# Patient Record
Sex: Male | Born: 1985 | Race: White | Hispanic: No | Marital: Single | Smoking: Current every day smoker
Health system: Southern US, Community
[De-identification: ages and names within clinical notes are randomized; demographics above are authoritative.]

## PROBLEM LIST (undated history)

## (undated) DIAGNOSIS — R7881 Bacteremia: Secondary | ICD-10-CM

## (undated) DIAGNOSIS — F1990 Other psychoactive substance use, unspecified, uncomplicated: Secondary | ICD-10-CM

## (undated) DIAGNOSIS — F17200 Nicotine dependence, unspecified, uncomplicated: Secondary | ICD-10-CM

## (undated) DIAGNOSIS — B9562 Methicillin resistant Staphylococcus aureus infection as the cause of diseases classified elsewhere: Secondary | ICD-10-CM

## (undated) HISTORY — PX: BRAIN SURGERY: SHX531

## (undated) HISTORY — PX: OTHER SURGICAL HISTORY: SHX169

---
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
## 2007-06-11 ENCOUNTER — Emergency Department (HOSPITAL_COMMUNITY): Admission: EM | Admit: 2007-06-11 | Discharge: 2007-06-11 | Payer: Self-pay | Admitting: Emergency Medicine
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
## 2007-06-22 ENCOUNTER — Emergency Department (HOSPITAL_COMMUNITY): Admission: EM | Admit: 2007-06-22 | Discharge: 2007-06-22 | Payer: Self-pay | Admitting: Emergency Medicine

## 2007-07-02 ENCOUNTER — Emergency Department (HOSPITAL_COMMUNITY): Admission: EM | Admit: 2007-07-02 | Discharge: 2007-07-02 | Payer: Self-pay | Admitting: Emergency Medicine

## 2008-10-02 ENCOUNTER — Emergency Department (HOSPITAL_COMMUNITY): Admission: EM | Admit: 2008-10-02 | Discharge: 2008-10-02 | Payer: Self-pay | Admitting: Emergency Medicine

## 2008-12-28 ENCOUNTER — Emergency Department (HOSPITAL_COMMUNITY): Admission: EM | Admit: 2008-12-28 | Discharge: 2008-12-28 | Payer: Self-pay | Admitting: Emergency Medicine
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
## 2009-01-25 ENCOUNTER — Emergency Department (HOSPITAL_COMMUNITY): Admission: EM | Admit: 2009-01-25 | Discharge: 2009-01-25 | Payer: Self-pay | Admitting: Emergency Medicine
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
## 2009-06-19 ENCOUNTER — Emergency Department (HOSPITAL_COMMUNITY): Admission: EM | Admit: 2009-06-19 | Discharge: 2009-06-19 | Payer: Self-pay | Admitting: Emergency Medicine

## 2009-06-25 ENCOUNTER — Emergency Department (HOSPITAL_COMMUNITY): Admission: EM | Admit: 2009-06-25 | Discharge: 2009-06-25 | Payer: Self-pay | Admitting: Emergency Medicine

## 2009-07-08 ENCOUNTER — Emergency Department (HOSPITAL_COMMUNITY): Admission: EM | Admit: 2009-07-08 | Discharge: 2009-07-09 | Payer: Self-pay | Admitting: Emergency Medicine

## 2009-07-08 ENCOUNTER — Emergency Department (HOSPITAL_COMMUNITY): Admission: EM | Admit: 2009-07-08 | Discharge: 2009-07-08 | Payer: Self-pay | Admitting: Emergency Medicine

## 2009-07-08 IMAGING — CR DG FINGER LITTLE 2+V*L*
3 series · 3 of 3 positions shown · non-contrast
Comparison: None

CLINICAL DATA: Status post fall with pain in the distal and mid
interphalangeal joints

LEFT LITTLE FINGER 2+V

[x finger pa left]
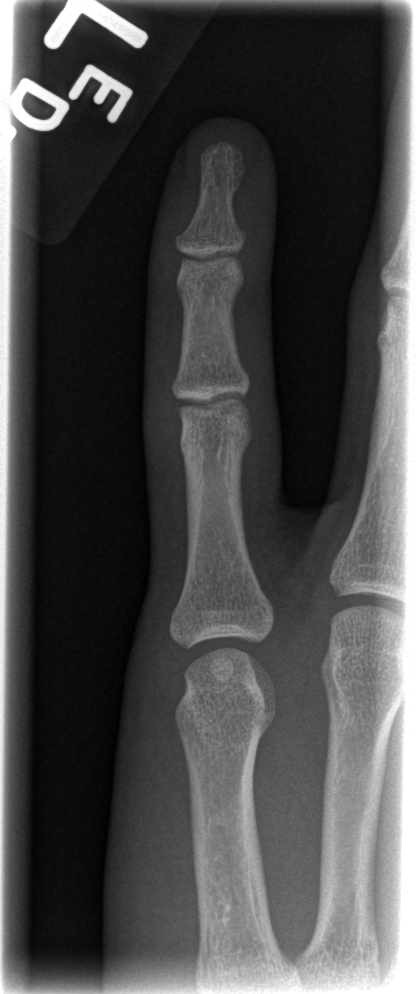

[x finger obl. left]
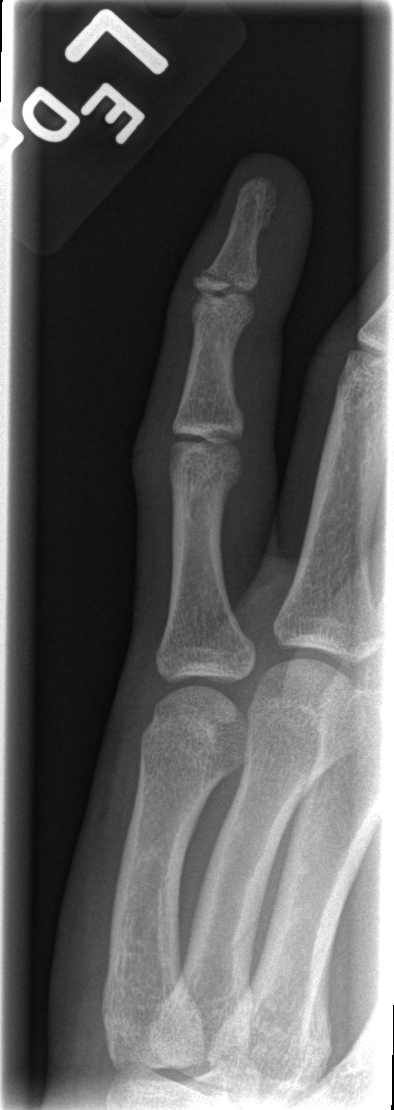

[x finger lateral left]
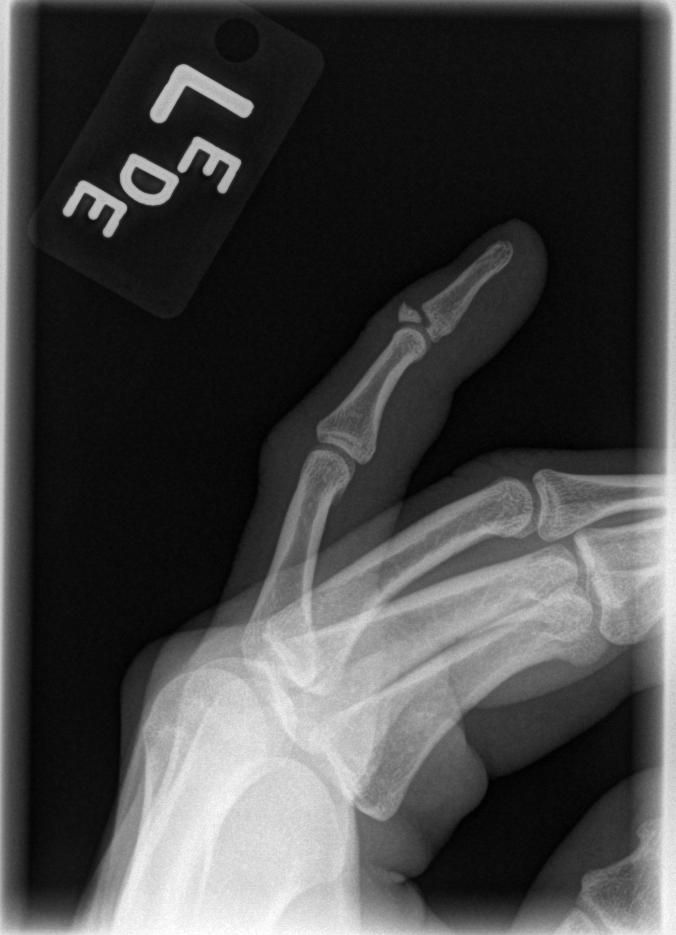

[3 of 3 positions shown; findings below may reference images not displayed]

FINDINGS: Bone density is within normal limits.  There is a
triangular fracture fragment avulsed from the proximal dorsal
aspect of the distal phalanx.  Intra-articular extension of the
oblique fracture line is seen.  Associated soft tissue swelling is
noted.
IMPRESSION: Distal phalangeal avulsion fracture as described above

## 2009-07-31 ENCOUNTER — Emergency Department (HOSPITAL_COMMUNITY): Admission: EM | Admit: 2009-07-31 | Discharge: 2009-07-31 | Payer: Self-pay | Admitting: Emergency Medicine
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
## 2009-10-12 ENCOUNTER — Emergency Department (HOSPITAL_COMMUNITY): Admission: EM | Admit: 2009-10-12 | Discharge: 2009-10-12 | Payer: Self-pay | Admitting: Emergency Medicine

## 2009-10-25 ENCOUNTER — Emergency Department (HOSPITAL_COMMUNITY): Admission: EM | Admit: 2009-10-25 | Discharge: 2009-10-25 | Payer: Self-pay | Admitting: Emergency Medicine
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
## 2009-10-31 ENCOUNTER — Emergency Department (HOSPITAL_COMMUNITY): Admission: EM | Admit: 2009-10-31 | Discharge: 2009-10-31 | Payer: Self-pay | Admitting: Emergency Medicine

## 2009-10-31 IMAGING — CR DG CHEST 2V
2 series · 2 of 2 positions shown · non-contrast
Comparison: [DATE]

CLINICAL DATA: Chest pain, cough, and nausea.

CHEST - 2 VIEW

[w chest pa]
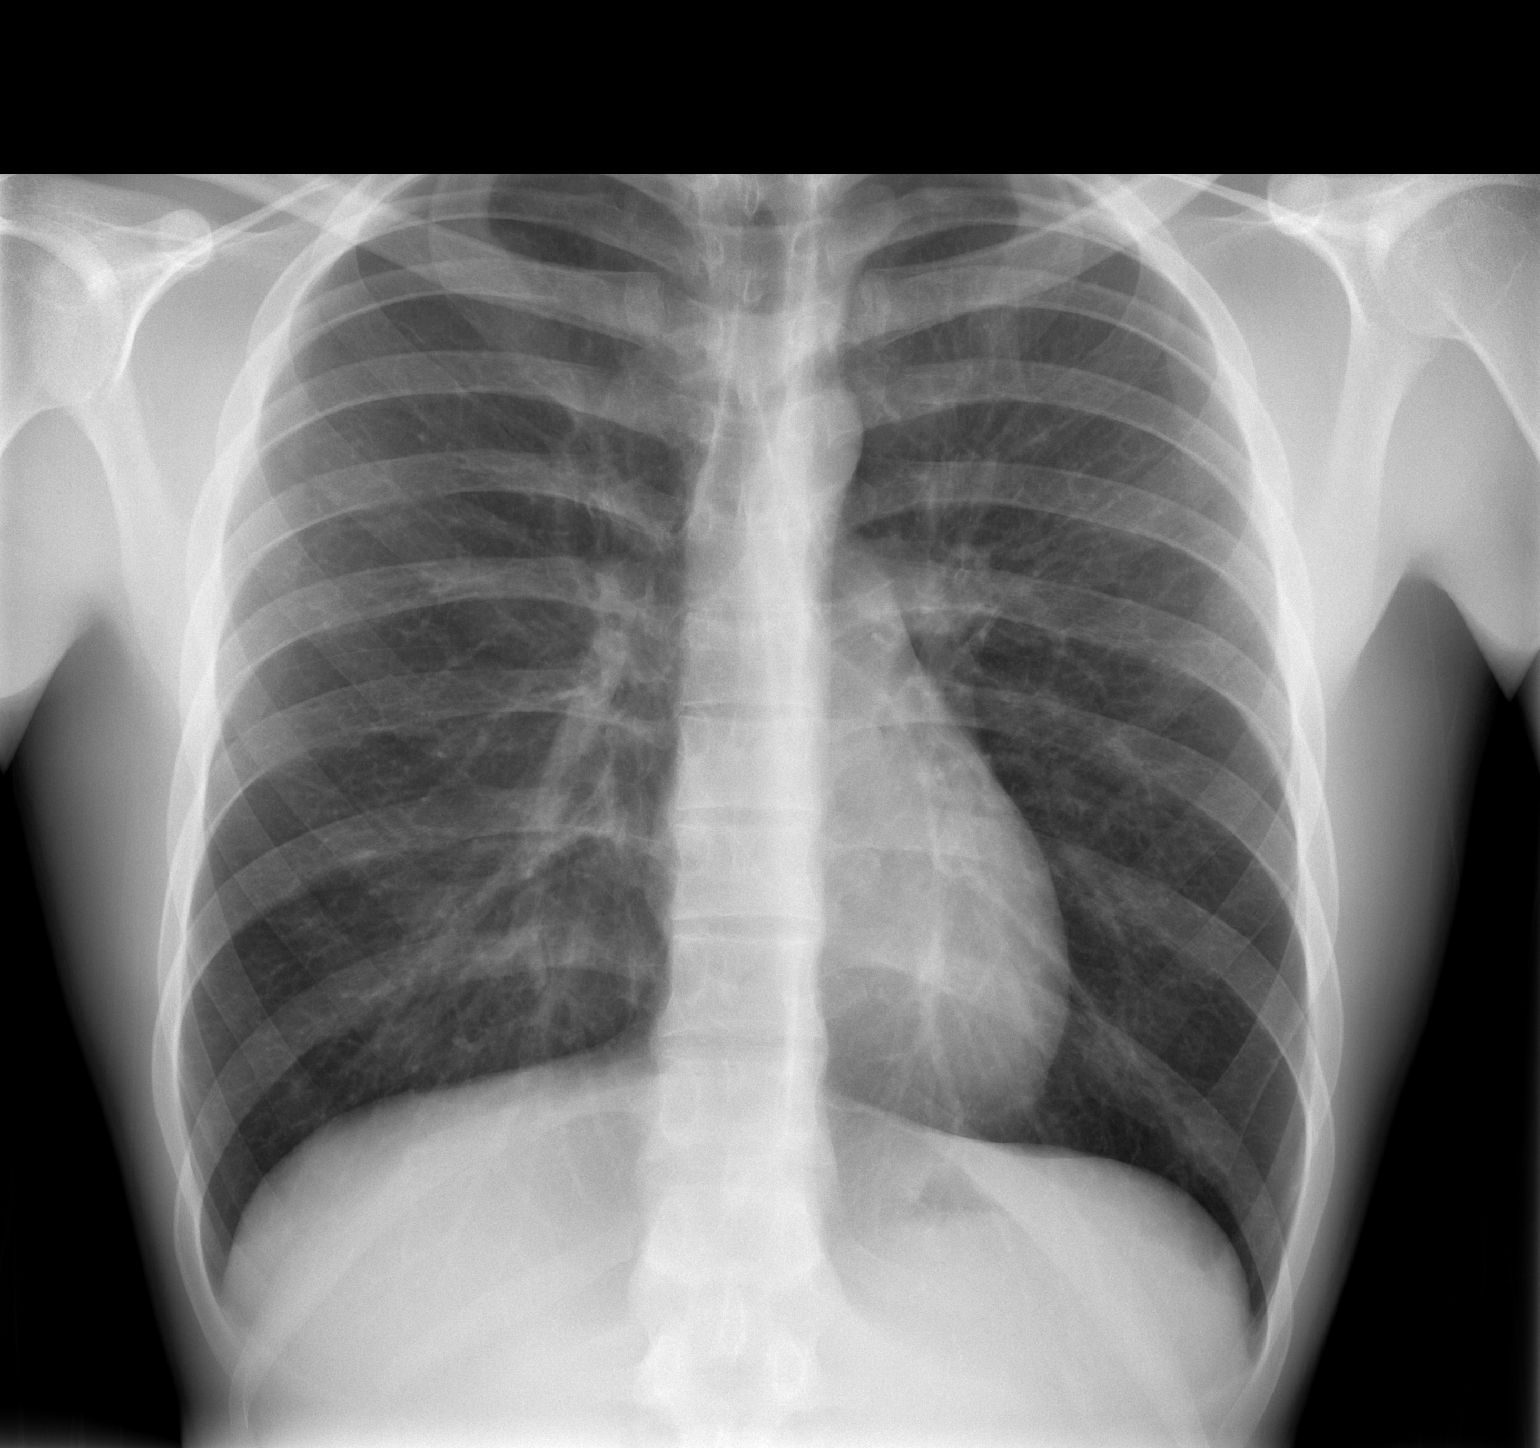

[w chest lat]
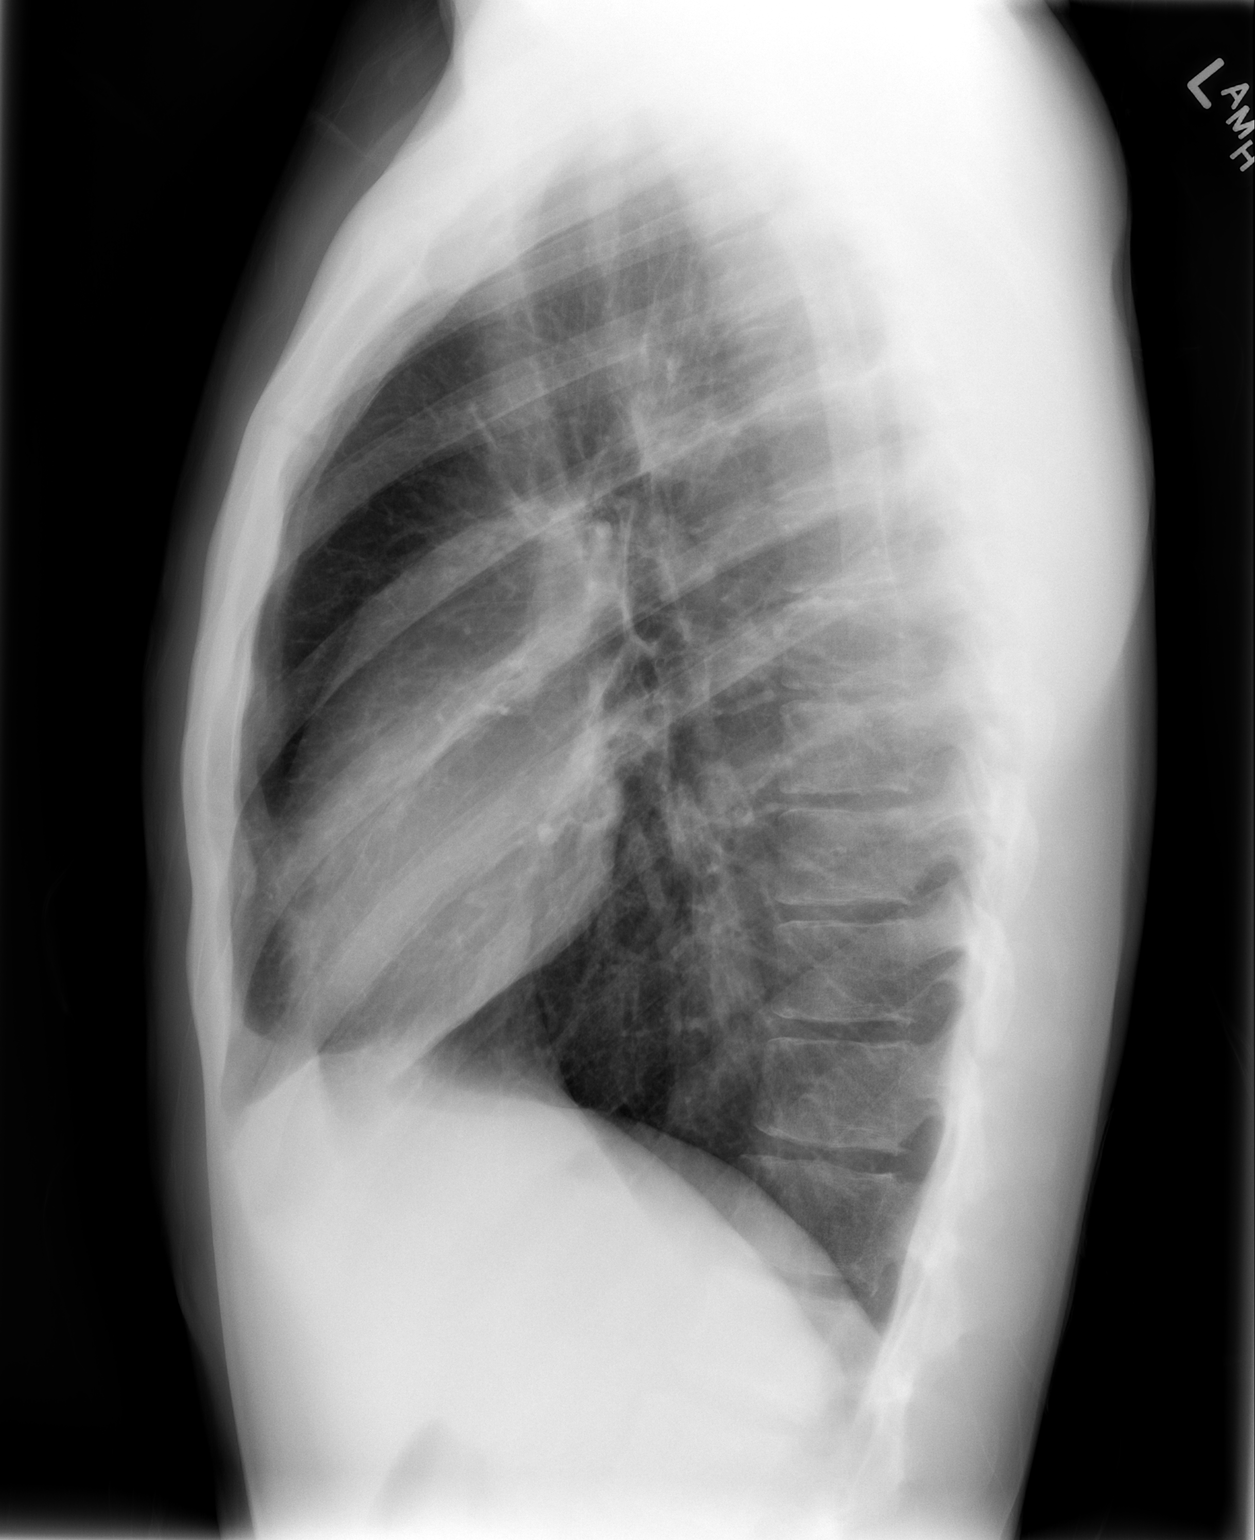

[2 of 2 positions shown; findings below may reference images not displayed]

FINDINGS: The heart size and vascularity are normal and the lungs
are clear.  No bony abnormality.
IMPRESSION: Normal chest.

## 2009-11-23 ENCOUNTER — Emergency Department (HOSPITAL_COMMUNITY): Admission: EM | Admit: 2009-11-23 | Discharge: 2009-11-23 | Payer: Self-pay | Admitting: Emergency Medicine

## 2009-11-30 ENCOUNTER — Emergency Department (HOSPITAL_COMMUNITY): Admission: EM | Admit: 2009-11-30 | Discharge: 2009-11-30 | Payer: Self-pay | Admitting: Emergency Medicine

## 2009-12-09 ENCOUNTER — Emergency Department (HOSPITAL_COMMUNITY): Admission: EM | Admit: 2009-12-09 | Discharge: 2009-12-09 | Payer: Self-pay | Admitting: Emergency Medicine
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
## 2009-12-15 ENCOUNTER — Emergency Department (HOSPITAL_COMMUNITY): Admission: EM | Admit: 2009-12-15 | Discharge: 2009-12-15 | Payer: Self-pay | Admitting: Emergency Medicine

## 2010-01-25 ENCOUNTER — Emergency Department (HOSPITAL_COMMUNITY): Admission: EM | Admit: 2010-01-25 | Discharge: 2010-01-25 | Payer: Self-pay | Admitting: Emergency Medicine

## 2010-02-05 ENCOUNTER — Emergency Department (HOSPITAL_COMMUNITY): Admission: EM | Admit: 2010-02-05 | Discharge: 2010-02-05 | Payer: Self-pay | Admitting: Emergency Medicine

## 2010-02-05 IMAGING — CR DG FINGER LITTLE 2+V*L*
3 series · 3 of 3 positions shown · non-contrast
Comparison: None.

CLINICAL DATA: The patient fell.  Pain distal phalangeal tuft.

LEFT LITTLE FINGER 2+V

[x finger pa left]
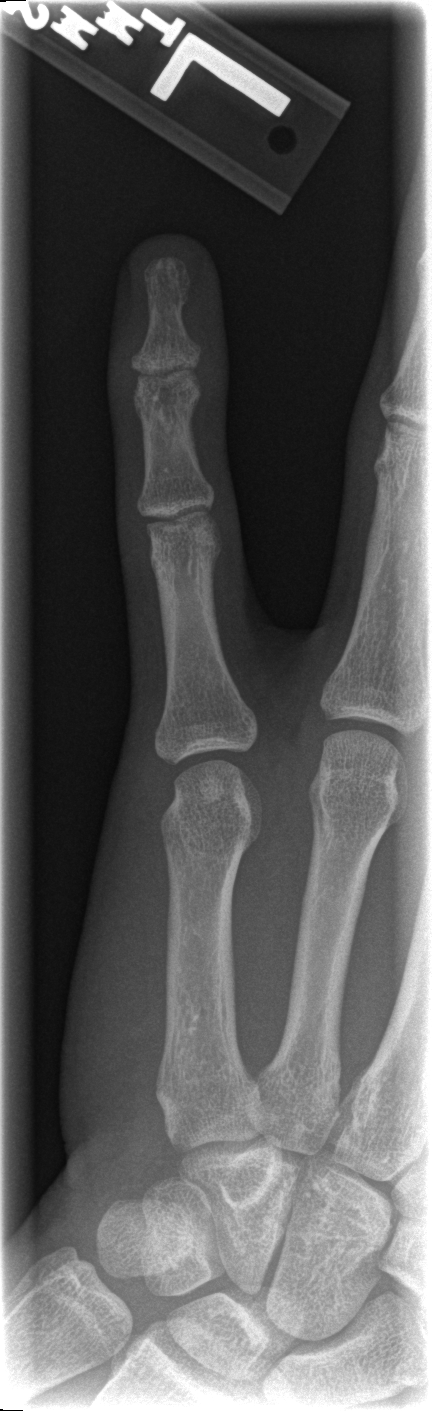

[x finger obl. left]
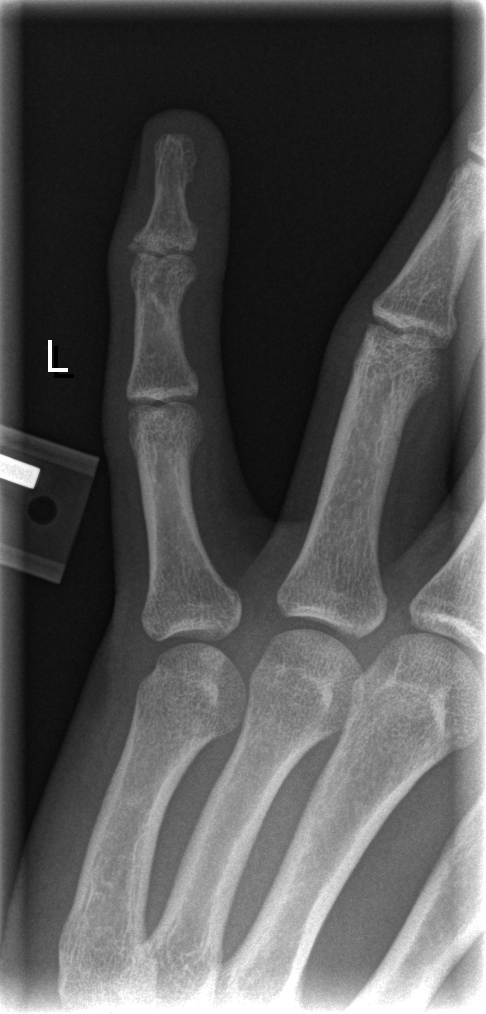

[x finger lateral left]
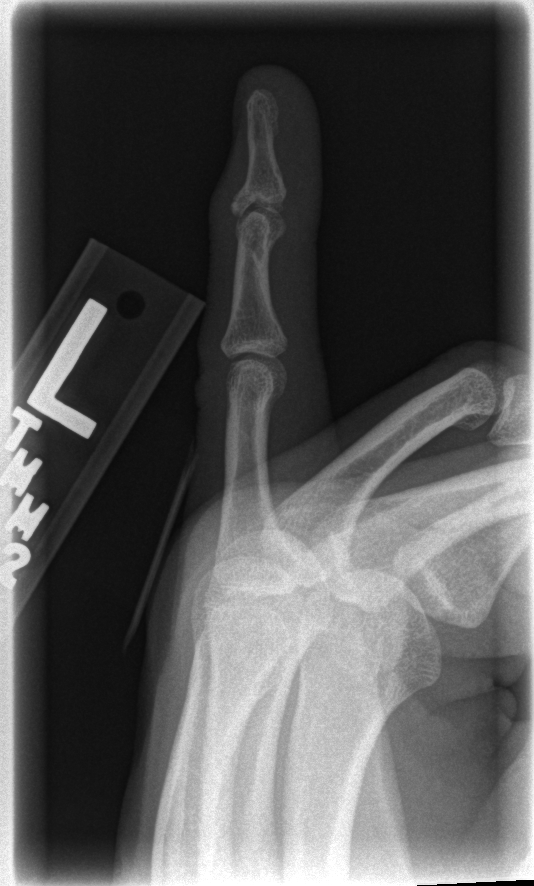

[3 of 3 positions shown; findings below may reference images not displayed]

FINDINGS: DJD changes of the DIP joint.  No acute fracture.  No
subluxation.
IMPRESSION: No acute findings.  DJD.

## 2010-02-09 ENCOUNTER — Emergency Department (HOSPITAL_COMMUNITY): Admission: EM | Admit: 2010-02-09 | Discharge: 2010-02-10 | Payer: Self-pay | Admitting: Emergency Medicine

## 2010-02-14 ENCOUNTER — Emergency Department (HOSPITAL_COMMUNITY): Admission: EM | Admit: 2010-02-14 | Discharge: 2010-02-15 | Payer: Self-pay | Admitting: Emergency Medicine

## 2010-02-15 ENCOUNTER — Ambulatory Visit: Payer: Self-pay | Admitting: Psychiatry

## 2010-02-15 ENCOUNTER — Inpatient Hospital Stay (HOSPITAL_COMMUNITY): Admission: AD | Admit: 2010-02-15 | Discharge: 2010-02-16 | Payer: Self-pay | Admitting: Psychiatry

## 2010-02-18 ENCOUNTER — Emergency Department (HOSPITAL_COMMUNITY): Admission: EM | Admit: 2010-02-18 | Discharge: 2010-02-18 | Payer: Self-pay | Admitting: Emergency Medicine

## 2010-02-18 IMAGING — CR DG HAND COMPLETE 3+V*R*
3 series · 3 of 3 positions shown · non-contrast
Comparison: [DATE]

CLINICAL DATA: Injury, pain.

RIGHT HAND - COMPLETE 3+ VIEW

[x hand pa right]
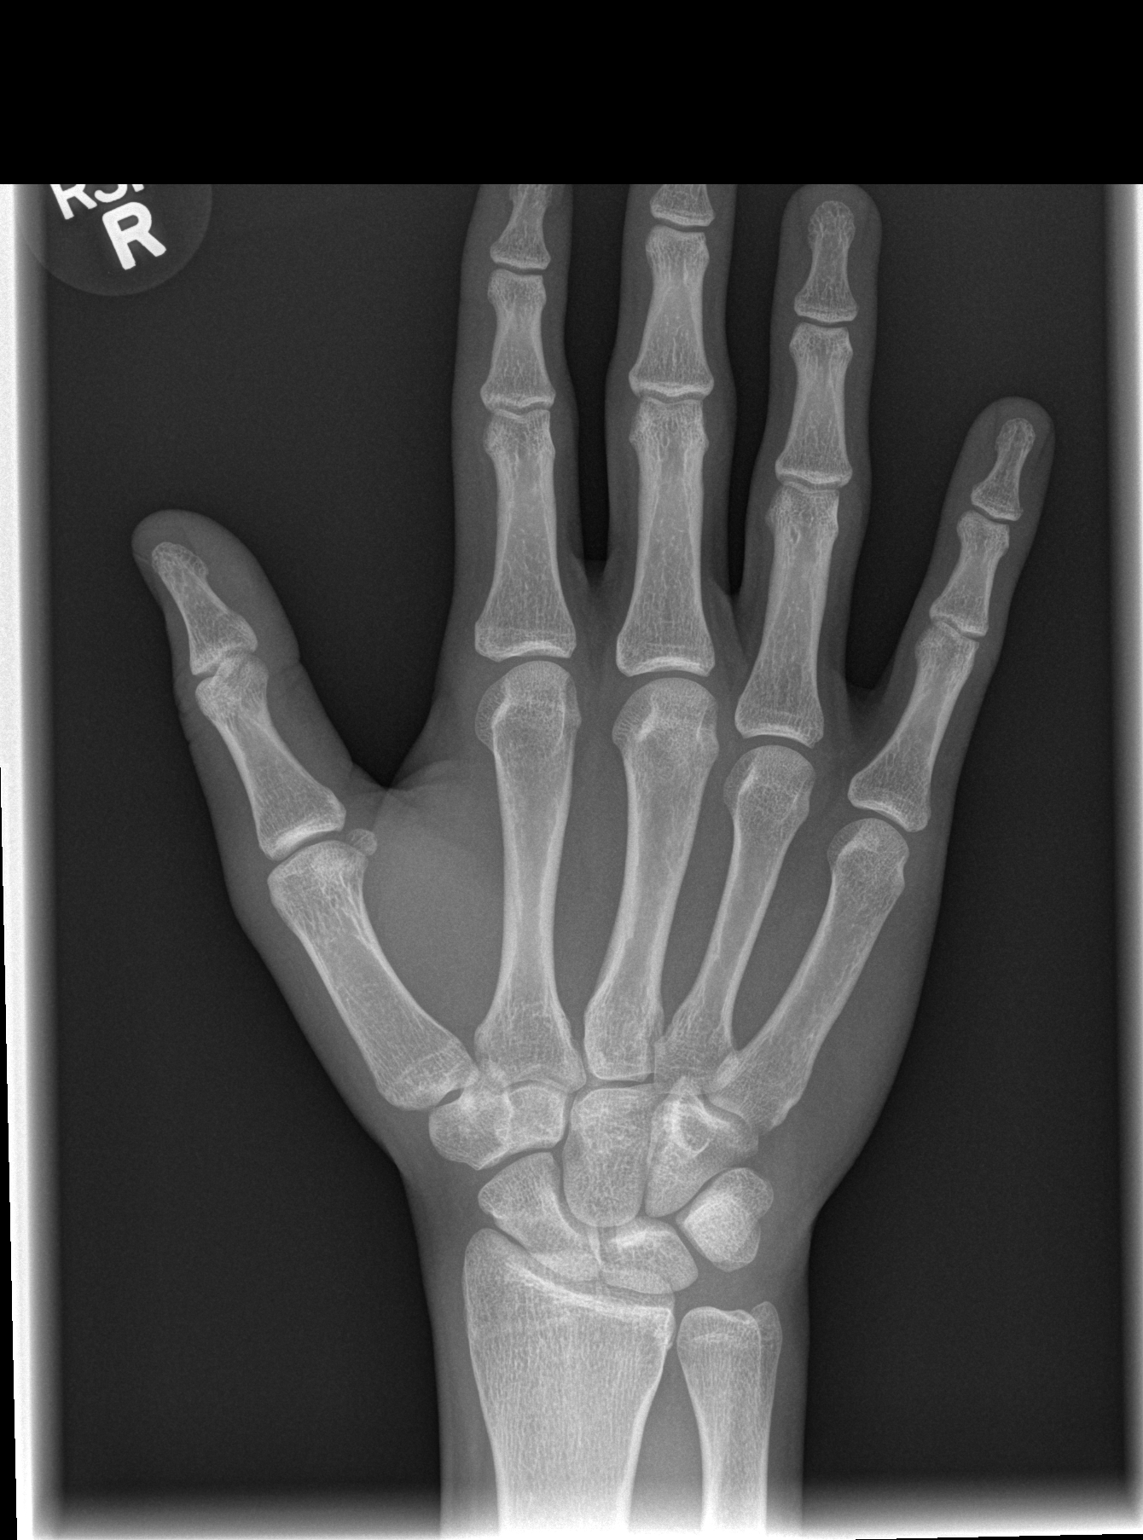

[x hand oblique right]
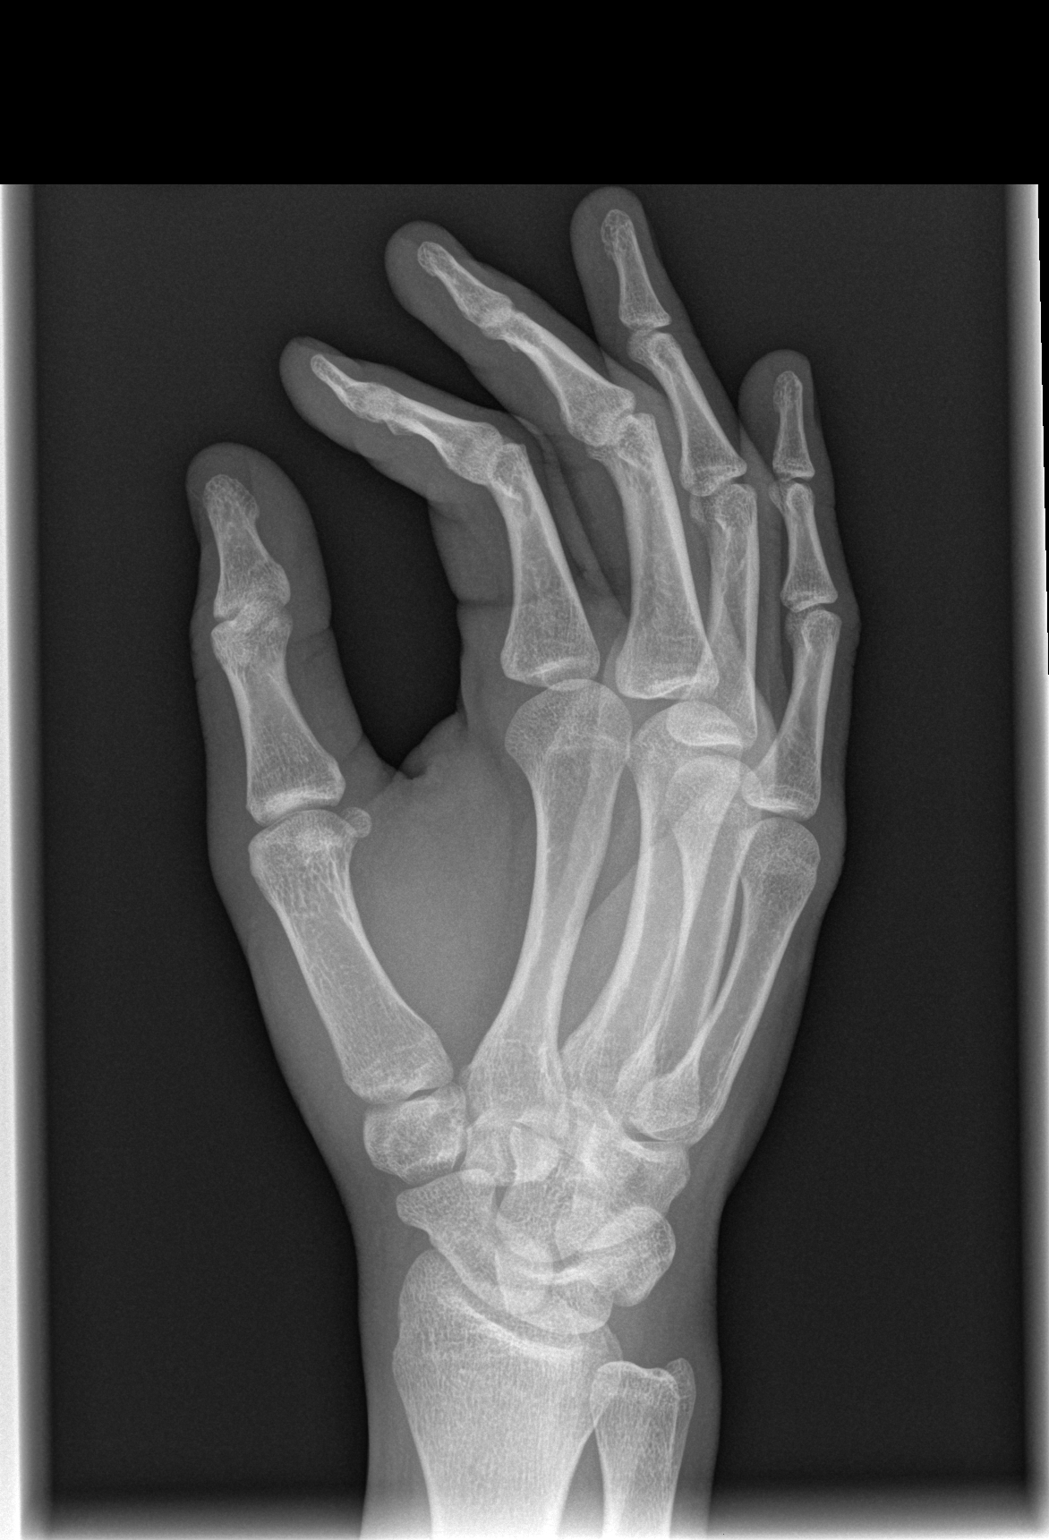

[x hand lat right]
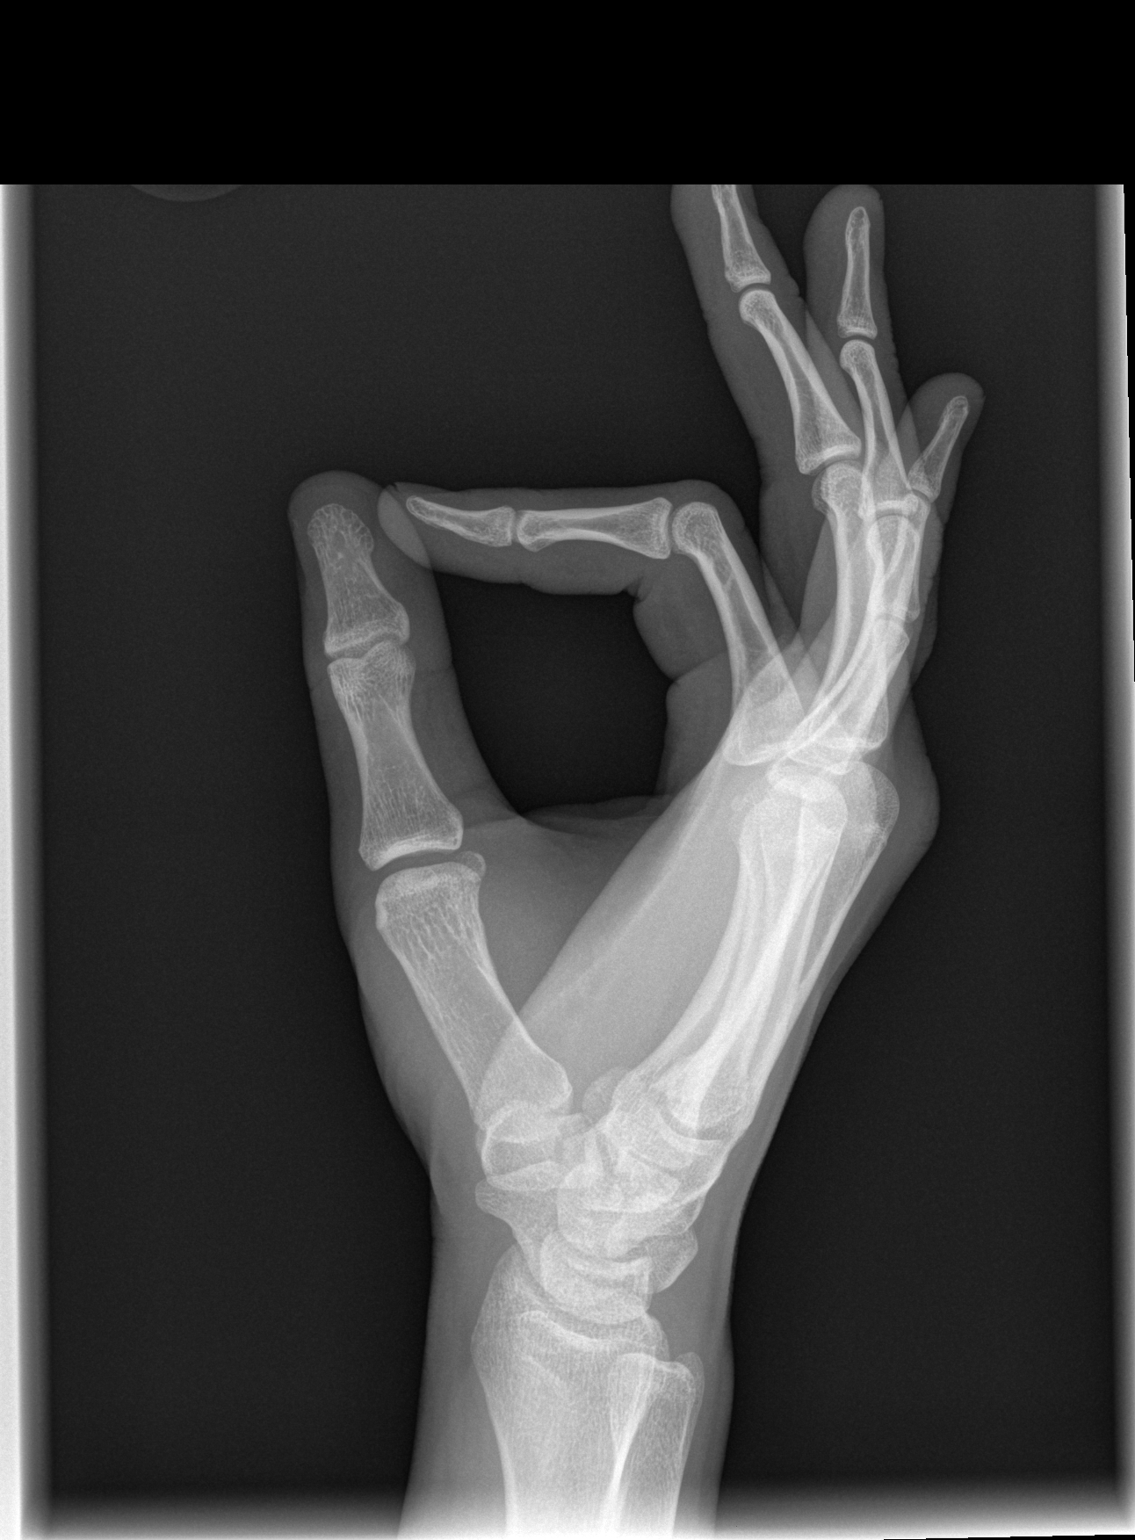

[3 of 3 positions shown; findings below may reference images not displayed]

FINDINGS: Negative for acute fracture.  Interval healing of second
metacarpal fracture since the prior study.  There is no
arthropathy.
IMPRESSION: Negative for acute fracture.

## 2010-06-28 ENCOUNTER — Emergency Department (HOSPITAL_COMMUNITY): Admission: EM | Admit: 2010-06-28 | Discharge: 2010-06-28 | Payer: Self-pay | Admitting: Emergency Medicine

## 2010-12-12 LAB — URINALYSIS, ROUTINE W REFLEX MICROSCOPIC
Glucose, UA: NEGATIVE mg/dL
Hgb urine dipstick: NEGATIVE
Protein, ur: NEGATIVE mg/dL
pH: 6 (ref 5.0–8.0)

## 2010-12-12 LAB — POCT I-STAT, CHEM 8
BUN: 10 mg/dL (ref 6–23)
Calcium, Ion: 1.16 mmol/L (ref 1.12–1.32)
Calcium, Ion: 1.21 mmol/L (ref 1.12–1.32)
Creatinine, Ser: 0.8 mg/dL (ref 0.4–1.5)
Creatinine, Ser: 0.9 mg/dL (ref 0.4–1.5)
HCT: 45 % (ref 39.0–52.0)
Hemoglobin: 15.3 g/dL (ref 13.0–17.0)
Potassium: 4.5 mEq/L (ref 3.5–5.1)
Sodium: 140 mEq/L (ref 135–145)
TCO2: 29 mmol/L (ref 0–100)

## 2010-12-12 LAB — RAPID URINE DRUG SCREEN, HOSP PERFORMED
Barbiturates: NOT DETECTED
Barbiturates: NOT DETECTED
Benzodiazepines: NOT DETECTED
Benzodiazepines: NOT DETECTED
Opiates: NOT DETECTED
Tetrahydrocannabinol: NOT DETECTED

## 2010-12-12 LAB — CBC
HCT: 41.4 % (ref 39.0–52.0)
Platelets: 193 10*3/uL (ref 150–400)
RBC: 4.38 MIL/uL (ref 4.22–5.81)
RDW: 12.4 % (ref 11.5–15.5)
RDW: 12.5 % (ref 11.5–15.5)
WBC: 8.3 10*3/uL (ref 4.0–10.5)

## 2010-12-12 LAB — DIFFERENTIAL
Basophils Absolute: 0 10*3/uL (ref 0.0–0.1)
Eosinophils Relative: 1 % (ref 0–5)
Lymphocytes Relative: 26 % (ref 12–46)
Lymphocytes Relative: 30 % (ref 12–46)
Lymphs Abs: 2.1 10*3/uL (ref 0.7–4.0)
Monocytes Absolute: 0.5 10*3/uL (ref 0.1–1.0)
Monocytes Absolute: 0.6 10*3/uL (ref 0.1–1.0)
Monocytes Relative: 7 % (ref 3–12)
Monocytes Relative: 7 % (ref 3–12)
Neutro Abs: 5.1 10*3/uL (ref 1.7–7.7)
Neutrophils Relative %: 63 % (ref 43–77)
Neutrophils Relative %: 66 % (ref 43–77)

## 2010-12-12 LAB — ETHANOL
Alcohol, Ethyl (B): <5 mg/dL (ref 0–10)
Alcohol, Ethyl (B): <5 mg/dL (ref 0–10)

## 2011-07-06 LAB — CBC
HCT: 44
HCT: 46.8
Hemoglobin: 15.5
MCHC: 35.2
MCV: 93.9
Platelets: 190
Platelets: 211
RDW: 12.5
WBC: 9.2

## 2011-07-06 LAB — RAPID URINE DRUG SCREEN, HOSP PERFORMED
Amphetamines: NOT DETECTED
Barbiturates: NOT DETECTED
Benzodiazepines: POSITIVE — AB
Cocaine: POSITIVE — AB
Opiates: NOT DETECTED

## 2011-07-06 LAB — DIFFERENTIAL
Basophils Absolute: 0
Basophils Absolute: 0
Basophils Relative: 0
Eosinophils Absolute: 0
Eosinophils Relative: 0
Lymphocytes Relative: 16
Monocytes Absolute: 0.5
Neutro Abs: 7.2

## 2011-07-06 LAB — URINALYSIS, ROUTINE W REFLEX MICROSCOPIC
Bilirubin Urine: NEGATIVE
Ketones, ur: NEGATIVE
Nitrite: NEGATIVE
Protein, ur: 100 — AB
Urobilinogen, UA: 0.2

## 2011-07-06 LAB — COMPREHENSIVE METABOLIC PANEL
Albumin: 4.8
BUN: 9
Chloride: 103
Creatinine, Ser: 1.3
GFR calc non Af Amer: >60
Total Bilirubin: 1.1

## 2011-07-06 LAB — BASIC METABOLIC PANEL
BUN: 6
CO2: 28
Glucose, Bld: 160 — ABNORMAL HIGH
Potassium: 3.4 — ABNORMAL LOW
Sodium: 136

## 2011-07-06 LAB — URINE MICROSCOPIC-ADD ON

## 2011-07-06 LAB — SALICYLATE LEVEL: Salicylate Lvl: <4

## 2011-12-21 ENCOUNTER — Encounter (HOSPITAL_COMMUNITY): Payer: Self-pay

## 2011-12-21 ENCOUNTER — Emergency Department (HOSPITAL_COMMUNITY)
Admission: EM | Admit: 2011-12-21 | Discharge: 2011-12-22 | Disposition: A | Discharge status: Home / Self Care | Payer: Self-pay | Attending: Emergency Medicine | Admitting: Emergency Medicine

## 2011-12-21 DIAGNOSIS — K0889 Other specified disorders of teeth and supporting structures: Secondary | ICD-10-CM

## 2011-12-21 DIAGNOSIS — K089 Disorder of teeth and supporting structures, unspecified: Secondary | ICD-10-CM | POA: Insufficient documentation

## 2011-12-21 NOTE — ED Notes (Signed)
Pt complains of tooth pain for several months

## 2011-12-22 MED ORDER — PENICILLIN V POTASSIUM 250 MG PO TABS: 250.0000 mg | ORAL_TABLET | Freq: Four times a day (QID) | ORAL | Status: AC

## 2011-12-22 MED ORDER — TRAMADOL HCL 50 MG PO TABS: 50.0000 mg | ORAL_TABLET | Freq: Four times a day (QID) | ORAL | Status: AC | PRN

## 2011-12-22 NOTE — ED Provider Notes (Signed)
Medical screening examination/treatment/procedure(s) were performed by non-physician practitioner and as supervising physician I was immediately available for consultation/collaboration.   Tiyona Desouza L Antionne Enrique, MD 12/22/11 0718 

## 2011-12-22 NOTE — ED Provider Notes (Signed)
History     CSN: [id number]  Arrival date & time 12/21/11  2102   First MD Initiated Contact with Patient 12/22/11 0011      Chief Complaint  Patient presents with  . Dental Pain    (Consider location/radiation/quality/duration/timing/severity/associated sxs/prior treatment) HPI History from patient. 26 year old male who complains of worsening tooth pain over the past week. He states that he has several "bad teeth" and is not currently followed by a dentist as he does not have insurance. He has noticed pain to his right lower molars for the past week or so. He has not noticed any facial swelling or neck swelling. Has not had any trouble opening his mouth. Denies difficulty eating, although he states that pain increases with chewing. Denies fever, chills.  History reviewed. No pertinent past medical history.  History reviewed. No pertinent past surgical history.  History reviewed. No pertinent family history.  History  Substance Use Topics  . Smoking status: Not on file  . Smokeless tobacco: Not on file  . Alcohol Use: Yes      Review of Systems  Constitutional: Negative for fever, chills, activity change and appetite change.  HENT: Positive for dental problem. Negative for sore throat, facial swelling, drooling, mouth sores, trouble swallowing, neck pain and voice change.   Gastrointestinal: Negative for nausea and vomiting.  Skin: Negative for color change and rash.    Allergies  Review of patient's allergies indicates no known allergies.  Home Medications   Current Outpatient Rx  Name Route Sig Dispense Refill  . ACETAMINOPHEN 500 MG PO TABS Oral Take 500 mg by mouth every 6 (six) hours as needed. For pain.    . BC HEADACHE POWDER PO Oral Take 1 packet by mouth 2 (two) times daily as needed. For pain.    . IBUPROFEN 800 MG PO TABS Oral Take 800 mg by mouth every 8 (eight) hours as needed. For pain.    Marland Kitchen NAPROXEN SODIUM 220 MG PO TABS Oral Take 440 mg by mouth 2  (two) times daily as needed. For pain.    Marland Kitchen PENICILLIN V POTASSIUM 250 MG PO TABS Oral Take 1 tablet (250 mg total) by mouth 4 (four) times daily. 40 tablet 0  . TRAMADOL HCL 50 MG PO TABS Oral Take 1 tablet (50 mg total) by mouth every 6 (six) hours as needed for pain. 15 tablet 0    BP 126/61  Pulse 69  Temp(Src) 97.9 F (36.6 C) (Oral)  Resp 18  SpO2 97%  Physical Exam  Nursing note and vitals reviewed. Constitutional: He appears well-developed and well-nourished. No distress.  HENT:  Head: Normocephalic and atraumatic.  Mouth/Throat: Oropharynx is clear and moist. No oropharyngeal exudate.       Generalized poor dentition. Large hole in second right lower molar; area TTP. No abscess noted. No trismus. No facial or neck swelling. Oropharynx clear.  Eyes: EOM are normal. Pupils are equal, round, and reactive to light.  Neck: Normal range of motion. Neck supple.  Cardiovascular: Normal rate.   Pulmonary/Chest: Effort normal.  Lymphadenopathy:    He has no cervical adenopathy.  Neurological: He is alert.  Skin: Skin is warm and dry. He is not diaphoretic.    ED Course  Procedures (including critical care time)  Labs Reviewed - No data to display No results found.   1. Pain, dental       MDM  Pt with dental pain - no obvious abscess, trismus, or other worrisome findings -  will tx with PCN. Small rx for tramadol given. Given contact info for DDS on call as well as dental clinic. Return precautions discussed.        Grant Fontana, [place] 12/22/11 [phone]

## 2011-12-22 NOTE — Discharge Instructions (Signed)
It is important that you follow up with a dentist for definitive treatment for your pain. Please call and make an appointment with the dentist listed above. See the attached resources for other dental clinics in the area. If you develop high fever, trouble opening your mouth, swelling of your face, or other worrisome symptoms, please return to the ER.  RESOURCE GUIDE  Dental Problems  Patients with Medicaid: [facility] [phone] [address]                                           [phone] [address] Phone:  [phone]                                                  Phone:  [phone]  If unable to pay or uninsured, contact:  [facility] or [facility]. to become qualified for the adult dental clinic.  Chronic Pain Problems Contact [facility]  [phone] Patients need to be referred by their primary care doctor.  Insufficient Money for Medicine Contact United Way:  call "211" or [facility] [phone].  No Primary Care Doctor Call [facility]  [phone] Other agencies that provide inexpensive medical care    [facility]  [phone]    [facility]  [phone]    [facility]  [phone]    [facility]  [phone]    [facility]  [phone]    [facility]  [phone]  Psychological Services [facility] Behavioral Health  [phone] [facility] Services  [phone] [facility] Mental Health   [phone] (emergency services [phone])  Substance Abuse Resources [facility] Drug Services  7736748474 [facility] [phone] [facility] [place] [phone] Floydene Flock [phone] Residential & Outpatient Substance Abuse Program  [phone]  Abuse/Neglect [facility] Child Abuse Hotline [phone] 066-6131 [facility] Child Abuse Hotline 9382195836 (After Hours)  Emergency Shelter [facility] [phone]  Maternity Homes Room at the [place] [facility] [place] [phone] [facility] [phone]  MRSA Hotline #:   431-365-4648    [facility] Resources  [facility] [place]     United Way                          [facility] Dept. [address] [place]                       [address]      [address]  [place]                                                Cristobal Goldmann Phone:  [phone]  Phone:  [phone]                 Phone:  [phone]  [facility] Mental Health Phone:  [phone]  [facility] Child Abuse Hotline [phone] 2408597192 (After Hours)  Toothache Toothaches are usually caused by tooth decay (cavity). However, other causes of toothache include:  Gum disease.   Cracked tooth.   Cracked filling.   Injury.   Jaw problem (temporo mandibular joint or TMJ disorder).   Tooth abscess.   Root sensitivity.   Grinding.   Eruption problems.  Swelling and redness around a painful tooth often means you have a dental abscess. Pain medicine and antibiotics can help reduce symptoms, but you will need to see a dentist within the next few days to have your problem properly evaluated and treated. If tooth decay is the problem, you may need a filling or root canal to save your tooth. If the problem is more severe, your tooth may need to be pulled. SEEK IMMEDIATE MEDICAL CARE IF:  You cannot swallow.   You develop severe swelling, increased redness, or increased pain in your mouth or face.   You have a fever.   You cannot open your mouth adequately.  Document Released: 10/19/2004 Document Revised: 08/31/2011 Document Reviewed: 12/09/2009 [facility] Patient Information 2012 [place], [place].

## 2012-01-05 ENCOUNTER — Encounter (HOSPITAL_COMMUNITY): Payer: Self-pay | Admitting: Emergency Medicine

## 2012-01-05 ENCOUNTER — Emergency Department (HOSPITAL_COMMUNITY)
Admission: EM | Admit: 2012-01-05 | Discharge: 2012-01-05 | Disposition: A | Discharge status: Home / Self Care | Payer: Self-pay | Attending: Emergency Medicine | Admitting: Emergency Medicine

## 2012-01-05 DIAGNOSIS — M549 Dorsalgia, unspecified: Secondary | ICD-10-CM | POA: Insufficient documentation

## 2012-01-05 MED ORDER — HYDROCODONE-ACETAMINOPHEN 5-325 MG PO TABS: ORAL_TABLET | ORAL | Status: AC

## 2012-01-05 MED ORDER — NAPROXEN 500 MG PO TABS: 500.0000 mg | ORAL_TABLET | Freq: Two times a day (BID) | ORAL | Status: DC

## 2012-01-05 MED ORDER — HYDROCODONE-ACETAMINOPHEN 5-325 MG PO TABS
1.0000 | ORAL_TABLET | Freq: Once | ORAL | Status: AC
  Administered 2012-01-05: 1 via ORAL
  Filled 2012-01-05: qty 1

## 2012-01-05 MED ORDER — METHOCARBAMOL 500 MG PO TABS: 1000.0000 mg | ORAL_TABLET | Freq: Four times a day (QID) | ORAL | Status: AC

## 2012-01-05 NOTE — Discharge Instructions (Signed)
Please read and follow all provided instructions.  Your diagnoses today include:  1. Back pain     Tests performed today include:  Vital signs - see below for your results today  Medications prescribed:   Vicodin (hydrocodone/acetaminophen) - narcotic pain medication  You have been prescribed narcotic pain medication such as Vicodin or Percocet: DO NOT drive or perform any activities that require you to be awake and alert because this medicine can make you drowsy. BE VERY CAREFUL not to take multiple medicines containing Tylenol (also called acetaminophen). Doing so can lead to an overdose which can damage your liver and cause liver failure and possibly death.    Robaxin (methocarbamol) - muscle relaxer medication  You have been prescribed a muscle relaxer medication such as Robaxin, Flexeril, or Valium: DO NOT drive or perform any activities that require you to be awake and alert because this medicine can make you drowsy.    Naproxen - anti-inflammatory pain medication  Do not exceed 500mg  naproxen every 12 hours  You have been prescribed an anti-inflammatory medication or NSAID. Take with food. Take smallest effective dose for the shortest duration needed for your pain. Stop taking if you experience stomach pain or vomiting.   Take any prescribed medications only as directed.  Home care instructions:   Follow any educational materials contained in this packet  Please rest, use ice or heat on your back for the next several days  Do not lift, push, pull anything more than 10 pounds for the next week  Follow-up instructions: Please follow-up with your primary care provider in the next 1 week for further evaluation of your symptoms. You may also call the provided orthopedic referral.    If you do not have a primary care doctor -- see below for referral information.   Return instructions:  SEEK IMMEDIATE MEDICAL ATTENTION IF YOU HAVE:  New numbness, tingling, weakness, or  problem with the use of your arms or legs  Severe back pain not relieved with medications  Loss control of your bowels or bladder  Increasing pain in any areas of the body (such as chest or abdominal pain)  Shortness of breath, dizziness, or fainting.   Worsening nausea (feeling sick to your stomach), vomiting, fever, or sweats  Any other emergent concerns regarding your health   Additional Information:  Your vital signs today were: BP 125/63  Pulse 70  Resp 18  SpO2 100% If your blood pressure (BP) was elevated above 135/85 this visit, please have this repeated by your doctor within one month. -------------- No Primary Care Doctor Call [facility]  [phone] Other agencies that provide inexpensive medical care    [facility]  [phone]    [facility] Internal Medicine  [phone]    [facility]  [phone]    [facility]  [phone]    [facility]  [phone]    [facility]  [phone] -------------- RESOURCE GUIDE:  Dental Problems  Patients with Medicaid: [facility] Dental [phone] [address]                                            [phone] [address] Phone:  [phone]  Phone:  [phone]  If unable to pay or uninsured, contact:  [facility] or [facility]. to become qualified for the adult dental clinic.  Chronic Pain Problems Contact [facility]  [phone] Patients need to be referred by their primary care doctor.  Insufficient Money for Medicine Contact United Way:  call "211" or [facility] [phone].  Psychological Services [facility] Behavioral Health  [phone] [facility]  [phone] [facility] Mental Health   [phone] (emergency services [phone])  Substance Abuse Resources Alcohol and Drug Services  5404945507 [facility] [phone] The  [place] [phone] Floydene Flock [phone] Residential & Outpatient Substance Abuse Program  [phone]  Abuse/Neglect [facility] Child Abuse Hotline 708-863-9579 [facility] Child Abuse Hotline (651)400-8525 (After Hours)  Emergency Shelter [facility] Ministries 5410783101  Maternity Homes Room at the [place] [facility] [place] [phone] [place] [facility] [phone]  [facility] Resources  [facility] [place]     United Way                          [facility] Dept. [address] [place]                       [address]      [address]  Blondell Reveal Phone:  [phone]                                   Phone:  [phone]                 Phone:  [phone]  [facility] Mental Health Phone:  [phone]  [facility] Child Abuse Hotline [phone] (After Hours)

## 2012-01-05 NOTE — ED Provider Notes (Signed)
History     CSN: [id number]  Arrival date & time 01/05/12  1409   First MD Initiated Contact with Patient 01/05/12 1603      Chief Complaint  Patient presents with  . 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     (Consider location/radiation/quality/duration/timing/severity/associated sxs/prior treatment) HPI Comments: Patient presents back pain for 2 weeks. 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 He is unable to work recently due to pain. Patient states that the pain is not worse with palpation but hurts worse in the morning and hurts worse when he bends over. The pain does not radiate into his legs. No red flag signs and symptoms of lower back pain including unexplained fevers or weight loss, IV drug use, weakness in his legs. Patient has used tramadol and Goody powder without relief.  Patient is a 26 y.o. male presenting with back pain. The history is provided by the patient.  Back Pain  This is a new problem. The current episode started more than 1 week ago. The problem occurs constantly. The problem has not changed since onset.The pain is associated with no known injury. The pain is present in the lumbar spine. The quality of the pain is described as aching. The pain does not radiate. The pain is moderate. The symptoms are aggravated by bending. Pertinent negatives include no fever, no numbness, no weight loss, no bowel incontinence, no perianal numbness, no bladder incontinence, no dysuria, no paresthesias and no weakness. He has tried NSAIDs and analgesics for the symptoms. The treatment provided no relief.    History reviewed. No pertinent past medical history.  History reviewed. No pertinent past surgical history.  No family history on file.  History  Substance Use Topics  . Smoking status: Not on file  . Smokeless tobacco: Not on file  . Alcohol Use: Yes      Review of Systems    Constitutional: Negative for fever, weight loss and unexpected weight change.  Gastrointestinal: Negative for constipation and bowel incontinence.       Neg for fecal incontinence  Genitourinary: Negative for bladder incontinence, dysuria, hematuria, flank pain and difficulty urinating.       Negative for urinary incontinence or retention  Musculoskeletal: Positive for back pain.  Neurological: Negative for weakness, numbness and paresthesias.       Negative for saddle paresthesias     Allergies  Review of patient's allergies indicates no known allergies.  Home Medications   Current Outpatient Rx  Name Route Sig Dispense Refill  . ACETAMINOPHEN 500 MG PO TABS Oral Take 500 mg by mouth every 6 (six) hours as needed. For pain.    . BC HEADACHE POWDER PO Oral Take 1 packet by mouth 2 (two) times daily as needed. For pain.    Marland Kitchen TRAMADOL HCL 50 MG PO TABS Oral Take 50 mg by mouth every 6 (six) hours as needed. pan      BP 125/63  Pulse 70  Resp 18  SpO2 100%  Physical Exam  Nursing note and vitals reviewed. Constitutional: He is oriented to person, place, and time. He appears well-developed and well-nourished.  HENT:  Head: Normocephalic and atraumatic.  Eyes: Conjunctivae are normal.  Neck: Normal range of motion.  Abdominal: Soft. There is no tenderness. There is no CVA tenderness.  Musculoskeletal: Normal range of motion. He exhibits no tenderness.  There is no tenderness to palpation over cervical/thoracic/lumbar/sacral spine. No tenderness to palpation over cervical/thoracic/lumbar paraspinal muscles. No step-off noted with palpation of spine. Pain worse with bending.  Neurological: He is alert and oriented to person, place, and time. He has normal reflexes. No sensory deficit. He exhibits normal muscle tone.       5/5 strength in entire lower extremities bilaterally. No sensation deficit.   Skin: Skin is warm and dry.  Psychiatric: He has a normal mood and affect.     ED Course  Procedures (including critical care time)  Labs Reviewed - No data to display No results found.   1. Back pain     4:19 PM Patient seen and examined.   Vital signs reviewed and are as follows: Filed Vitals:   01/05/12 1429  BP: 125/63  Pulse: 70  Resp: 18   No red flag s/s of low back pain. Offered x-rays of lower back given duration of pain but patient declines. Patient was counseled on back pain precautions and told to do activity as tolerated but do not lift, push, or pull heavy objects more than 10 pounds for the next week.  Patient counseled to use ice or heat on back for no longer than 15 minutes every hour.   Patient prescribed muscle relaxer and counseled on proper use of muscle relaxant medication.    Patient prescribed narcotic pain medicine and counseled on proper use of narcotic pain medications. Counseled not to combine this medication with others containing tylenol.   Urged patient not to drink alcohol, drive, or perform any other activities that requires focus while taking either of these medications.  Patient urged to follow-up with PCP if pain does not improve with treatment and rest or if pain becomes recurrent. Urged to return with worsening severe pain, loss of bowel or bladder control, trouble walking.  Urged orthopedic follow-up, given.    The patient verbalizes understanding and agrees with the plan.  MDM  Patient with back pain. No neurological deficits. Patient is ambulatory. No warning symptoms of back pain including: loss of bowel or bladder control, night sweats, waking from sleep with back pain, unexplained fevers or weight loss, h/o cancer, IVDU, recent trauma. No concern for cauda equina, epidural abscess, or other serious cause of back pain. Conservative measures such as rest, ice/heat and pain medicine indicated with PCP follow-up if no improvement with conservative management. Only concerns are duration of pain so far without  improvement.         Renne Crigler, [place] 01/06/12 [phone]

## 2012-01-12 NOTE — ED Provider Notes (Signed)
Medical screening examination/treatment/procedure(s) were performed by non-physician practitioner and as supervising physician I was immediately available for consultation/collaboration.  Raeford Razor, MD 01/12/12 [phone]

## 2012-05-24 ENCOUNTER — Emergency Department (HOSPITAL_COMMUNITY)
Admission: EM | Admit: 2012-05-24 | Discharge: 2012-05-24 | Disposition: A | Discharge status: Home / Self Care | Payer: No Typology Code available for payment source | Attending: Emergency Medicine | Admitting: Emergency Medicine

## 2012-05-24 ENCOUNTER — Encounter (HOSPITAL_COMMUNITY): Payer: Self-pay | Admitting: Emergency Medicine

## 2012-05-24 DIAGNOSIS — F17200 Nicotine dependence, unspecified, uncomplicated: Secondary | ICD-10-CM | POA: Insufficient documentation

## 2012-05-24 DIAGNOSIS — IMO0001 Reserved for inherently not codable concepts without codable children: Secondary | ICD-10-CM | POA: Insufficient documentation

## 2012-05-24 MED ORDER — NAPROXEN 500 MG PO TABS: 500.0000 mg | ORAL_TABLET | Freq: Two times a day (BID) | ORAL | Status: DC

## 2012-05-24 MED ORDER — OXYCODONE-ACETAMINOPHEN 5-325 MG PO TABS
1.0000 | ORAL_TABLET | Freq: Once | ORAL | Status: AC
  Administered 2012-05-24: 1 via ORAL
  Filled 2012-05-24: qty 1

## 2012-05-24 MED ORDER — HYDROCODONE-ACETAMINOPHEN 5-325 MG PO TABS: 1.0000 | ORAL_TABLET | Freq: Four times a day (QID) | ORAL | Status: AC | PRN
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
## 2012-05-25 NOTE — ED Provider Notes (Signed)
Medical screening examination/treatment/procedure(s) were performed by non-physician practitioner and as supervising physician I was immediately available for consultation/collaboration.  Gerhard Munch, MD 05/25/12 0010

## 2013-03-30 ENCOUNTER — Emergency Department (HOSPITAL_COMMUNITY)
Admission: EM | Admit: 2013-03-30 | Discharge: 2013-03-30 | Disposition: A | Discharge status: Home / Self Care | Payer: Self-pay | Attending: Emergency Medicine | Admitting: Emergency Medicine

## 2013-03-30 DIAGNOSIS — F17200 Nicotine dependence, unspecified, uncomplicated: Secondary | ICD-10-CM | POA: Insufficient documentation

## 2013-03-30 DIAGNOSIS — K089 Disorder of teeth and supporting structures, unspecified: Secondary | ICD-10-CM | POA: Insufficient documentation

## 2013-03-30 DIAGNOSIS — K0889 Other specified disorders of teeth and supporting structures: Secondary | ICD-10-CM

## 2013-03-30 MED ORDER — HYDROCODONE-ACETAMINOPHEN 5-325 MG PO TABS: 1.0000 | ORAL_TABLET | ORAL | Status: DC | PRN

## 2013-03-30 MED ORDER — AMOXICILLIN 500 MG PO CAPS: 500.0000 mg | ORAL_CAPSULE | Freq: Three times a day (TID) | ORAL | Status: DC

## 2013-03-30 MED ORDER — OXYCODONE-ACETAMINOPHEN 5-325 MG PO TABS
1.0000 | ORAL_TABLET | Freq: Once | ORAL | Status: AC
  Administered 2013-03-30: 1 via ORAL
  Filled 2013-03-30: qty 1

## 2013-03-30 NOTE — ED Provider Notes (Signed)
History  This chart was scribed for [facility] Yovana Scogin - PA by Manuela Schwartz, ED scribe. This patient was seen in room WTR5/WTR5 and the patient's care was started at 1845.  CSN: [id number] Arrival date & time 03/30/13  1736  First MD Initiated Contact with Patient 03/30/13 1845     Chief Complaint  Patient presents with  . Dental Pain   Patient is a 27 y.o. male presenting with tooth pain. The history is provided by the patient. No language interpreter was used.  Dental Pain Location:  Lower Quality:  Aching Severity:  Moderate Onset quality:  Gradual Timing:  Constant Progression:  Worsening Chronicity:  Chronic Context: dental caries, dental fracture and poor dentition   Context: not crown fracture, not recent dental surgery and not trauma   Relieved by:  Nothing Worsened by:  Cold food/drink Ineffective treatments:  Topical anesthetic gel Associated symptoms: no congestion, no difficulty swallowing, no drooling, no fever and no headaches    HPI Comments: Duane Price is a 27 y.o. male who presents to the Emergency Department complaining of constant, gradually worsening, aching left lower dental pain which has worsened over the past several days. He states his dental pain has been a chronic problem and was not able to see anyone for this problem until it worsened recently. He denies difficulty swollowing and no sore throat. He sates associated localized swelling/pain and sensitivity to cold liquids over left lower teeth. He denies fever/chills.    No past medical history on file. Past Surgical History  Procedure Laterality Date  . Facial surgery     No family history on file. History  Substance Use Topics  . Smoking status: Current Every Day Smoker -- 0.50 packs/day  . Smokeless tobacco: Not on file  . Alcohol Use: Yes    Review of Systems  Constitutional: Negative for fever and chills.  HENT: Positive for dental problem (left lower dental pain/dental carries). Negative for  nosebleeds, congestion and drooling.   Respiratory: Negative for cough and shortness of breath.   Gastrointestinal: Negative for nausea, vomiting and abdominal pain.  Musculoskeletal: Negative for back pain.  Skin: Negative for color change and pallor.  Neurological: Negative for weakness and headaches.  All other systems reviewed and are negative.   A complete 10 system review of systems was obtained and all systems are negative except as noted in the HPI and PMH.   Allergies  Review of patient's allergies indicates no known allergies.  Home Medications   Current Outpatient Rx  Name  Route  Sig  Dispense  Refill  . acetaminophen (TYLENOL) 500 MG tablet   Oral   Take 500 mg by mouth every 6 (six) hours as needed. For pain.         Marland Kitchen amoxicillin (AMOXIL) 500 MG capsule   Oral   Take 1 capsule (500 mg total) by mouth 3 (three) times daily.   21 capsule   0   . Aspirin-Salicylamide-Caffeine (BC HEADACHE POWDER PO)   Oral   Take 1 packet by mouth 2 (two) times daily as needed. For pain.         Marland Kitchen HYDROcodone-acetaminophen (NORCO/VICODIN) 5-325 MG per tablet   Oral   Take 1 tablet by mouth every 4 (four) hours as needed for pain.   20 tablet   0   . naproxen (NAPROSYN) 500 MG tablet   Oral   Take 1 tablet (500 mg total) by mouth 2 (two) times daily.   30 tablet  0    Triage Vitals: BP 114/64  Pulse 62  Temp(Src) 98.2 F (36.8 C) (Oral)  Resp 16  SpO2 100% Physical Exam  Nursing note and vitals reviewed. Constitutional: He is oriented to person, place, and time. He appears well-developed and well-nourished. No distress.  HENT:  Head: Normocephalic and atraumatic.  Mouth/Throat: Dental caries present.    Eyes: Conjunctivae and EOM are normal. Pupils are equal, round, and reactive to light.  Neck: Normal range of motion. Neck supple. No tracheal deviation present.  Cardiovascular: Normal rate and regular rhythm.   Pulmonary/Chest: Effort normal and breath  sounds normal. No respiratory distress.  Musculoskeletal: Normal range of motion.  Neurological: He is alert and oriented to person, place, and time.  Skin: Skin is warm and dry.  Psychiatric: He has a normal mood and affect. His behavior is normal.    ED Course  Procedures (including critical care time) DIAGNOSTIC STUDIES: Oxygen Saturation is 100% on room air, normal by my interpretation.    COORDINATION OF CARE: Offered Dental Block- he refused.  Labs Reviewed - No data to display No results found. 1. Toothache     MDM  Patient has dental pain. No emergent s/sx's present. Patent airway. No trismus.  Will be given pain medication and antibiotics. I discussed the need to call dentist within 24/48 hours for follow-up. Dental referral given. Return to ED precautions given.  Pt voiced understanding and has agreed to follow-up.   I personally performed the services described in this documentation, which was scribed in my presence. The recorded information has been reviewed and is accurate.    Dorthula Matas, PA-C 03/30/13 1912

## 2013-03-30 NOTE — ED Notes (Signed)
Pt states he has a ride home

## 2013-03-30 NOTE — ED Notes (Signed)
Pt c/o dental pain to L lower side of mouth in back. Pt states it feels like tooth may be infected. Pt states he does not have a dentist. Pt states he took Motrin 800 with some relief. Pt ambulatory to exam room with steady gait. Pt states he has a ride home.

## 2013-03-30 NOTE — ED Provider Notes (Signed)
Medical screening examination/treatment/procedure(s) were performed by non-physician practitioner and as supervising physician I was immediately available for consultation/collaboration.  Ethelda Chick, MD 03/30/13 [phone]

## 2013-04-19 ENCOUNTER — Emergency Department (INDEPENDENT_AMBULATORY_CARE_PROVIDER_SITE_OTHER)
Admission: EM | Admit: 2013-04-19 | Discharge: 2013-04-19 | Disposition: A | Discharge status: Home / Self Care | Payer: Self-pay | Source: Home / Self Care

## 2013-04-19 ENCOUNTER — Encounter (HOSPITAL_COMMUNITY): Payer: Self-pay | Admitting: Emergency Medicine

## 2013-04-19 DIAGNOSIS — K089 Disorder of teeth and supporting structures, unspecified: Secondary | ICD-10-CM

## 2013-04-19 DIAGNOSIS — K0889 Other specified disorders of teeth and supporting structures: Secondary | ICD-10-CM

## 2013-04-19 MED ORDER — MELOXICAM 15 MG PO TABS: 15.0000 mg | ORAL_TABLET | Freq: Every day | ORAL | Status: DC

## 2013-04-19 MED ORDER — AMOXICILLIN 500 MG PO CAPS: 500.0000 mg | ORAL_CAPSULE | Freq: Three times a day (TID) | ORAL | Status: DC

## 2013-04-19 NOTE — ED Provider Notes (Signed)
Duane Price is a 27 y.o. male who presents to Urgent Care today for tooth pain. Patient has had worsening dental pain for the last 2-3 weeks. He has an old dental care he was fracture that hasn't bothered him for several years until recently. He notes the pain is worsened and is quite intense. Patient was seen in the emergency room in early July and prescribed hydrocodone and amoxicillin. Patient did not fill the amoxicillin but didn't fill the hydrocodone. He denies any other visits to other doctor's office for pain medications. He has not yet made an appointment with a dentist. No fevers or chills or trouble swallowing or breathing.    PMH reviewed. Healthy otherwise History  Substance Use Topics  . Smoking status: Current Every Day Smoker -- 0.50 packs/day  . Smokeless tobacco: Not on file  . Alcohol Use: Yes   ROS as above Medications reviewed. No current facility-administered medications for this encounter.   Current Outpatient Prescriptions  Medication Sig Dispense Refill  . amoxicillin (AMOXIL) 500 MG capsule Take 1 capsule (500 mg total) by mouth 3 (three) times daily.  21 capsule  0  . meloxicam (MOBIC) 15 MG tablet Take 1 tablet (15 mg total) by mouth daily.  30 tablet  0    Exam:  BP 125/71  Pulse 74  Temp(Src) 97.2 F (36.2 C) (Oral)  Resp 18  SpO2 96% Gen: Well NAD HEENT: EOMI,  MMM,  Left lower molar dental fracture about a dental filling with surrounding erythema at the gumline.  Lungs: CTABL Nl WOB Heart: RRR no MRG Abd: NABS, NT, ND Exts: Non edematous BL  LE, warm and well perfused.   I generated report in the [place] controlled substance database showing patient had been seen at 3 different doctors offices including [place] and [place] preceding small amounts of hydrocodone at each location.  Patient did not note this when asked earlier.   No results found for this or any previous visit (from the past 24 hour(s)). No results found.  Assessment  and Plan: 27 y.o. male with dental pain complicated by drug-seeking behavior.  Patient does have dental pain with a dental fracture and what appears to be an infected cavity.  Ultimately this will require removal. I provided patient with information for affordable dentist for dental extraction. Additionally I prescribed amoxicillin for antibiotics.   However patient was not totally honest about his pain medication prescriptions over the last month.  I should patient the report and he did did not deny any of those visits.  I stated that I cannot prescribe narcotics at this time however I'm happy to prescribe meloxicam.  He agrees and will followup with a dentist as soon as possible.   Discussed warning signs or symptoms. Please see discharge instructions. Patient expresses understanding.      Rodolph Bong, MD 04/19/13 [phone]

## 2013-04-19 NOTE — ED Notes (Signed)
Pt c/o dental pain onset 1 week... Pain originates at bottom molars on left and right side; pain radiates towards jaw and head... Denies fevers, swelling... Taking tyle/ibup w/no relief... He is alert w/no signs of acute distress.

## 2013-10-19 ENCOUNTER — Emergency Department (HOSPITAL_COMMUNITY)
Admission: EM | Admit: 2013-10-19 | Discharge: 2013-10-19 | Disposition: A | Discharge status: Home / Self Care | Payer: Self-pay | Attending: Emergency Medicine | Admitting: Emergency Medicine

## 2013-10-19 ENCOUNTER — Encounter (HOSPITAL_COMMUNITY): Payer: Self-pay | Admitting: Emergency Medicine

## 2013-10-19 DIAGNOSIS — K0889 Other specified disorders of teeth and supporting structures: Secondary | ICD-10-CM

## 2013-10-19 DIAGNOSIS — K029 Dental caries, unspecified: Secondary | ICD-10-CM | POA: Insufficient documentation

## 2013-10-19 DIAGNOSIS — K089 Disorder of teeth and supporting structures, unspecified: Secondary | ICD-10-CM | POA: Insufficient documentation

## 2013-10-19 DIAGNOSIS — F17200 Nicotine dependence, unspecified, uncomplicated: Secondary | ICD-10-CM | POA: Insufficient documentation

## 2013-10-19 MED ORDER — HYDROCODONE-ACETAMINOPHEN 5-325 MG PO TABS: 1.0000 | ORAL_TABLET | Freq: Four times a day (QID) | ORAL | Status: DC | PRN

## 2013-10-19 MED ORDER — AMOXICILLIN 500 MG PO CAPS: 500.0000 mg | ORAL_CAPSULE | Freq: Three times a day (TID) | ORAL | Status: DC

## 2013-10-19 MED ORDER — HYDROCODONE-ACETAMINOPHEN 5-325 MG PO TABS
2.0000 | ORAL_TABLET | Freq: Once | ORAL | Status: AC
  Administered 2013-10-19: 2 via ORAL
  Filled 2013-10-19: qty 2

## 2013-10-19 NOTE — Discharge Instructions (Signed)
You have a dental infection Use the resource guide listed below to help you find a dentist if you do not already have one to followup with. It is very important that you get evaluated by a dentist as soon as possible. Call tomorrow to schedule an appointment. Use your pain medication as prescribed and do not operate heavy machinery while on pain medication. Note that your pain medication contains acetaminophen (Tylenol) & its is not reccommended that you use additional acetaminophen (Tylenol) while taking this medication. Take your full course of antibiotics. Read the instructions below. ° °Eat a soft or liquid diet and rinse your mouth out after meals with warm water. You should see a dentist or return here at once if you have increased swelling, increased pain or uncontrolled bleeding from the site of your injury. ° ° °SEEK MEDICAL CARE IF:  °· You have increased pain not controlled with medicines.  °· You have swelling around your tooth, in your face or neck.  °· You have bleeding which starts, continues, or gets worse.  °· You have a fever >101 °· If you are unable to open your mouth ° °RESOURCE GUIDE ° °Dental Problems ° °Patients with Medicaid: °[facility]                     [facility] °[address]                                           [address] °Phone:  [phone]                                                  Phone:  510-2600 ° °If unable to pay or uninsured, contact:  [facility] or [facility] to become qualified for the adult dental clinic. ° °Chronic Pain Problems °Contact [facility]  [phone] °Patients need to be referred by their primary care doctor. ° °Insufficient Money for Medicine °Contact [employer]:  call "211" or [facility] 271-5999. ° °No Primary Care Doctor °Call [facility]  832-8000 °Other agencies that provide inexpensive medical care °   [facility]  [phone] °   [facility]  832-7272 °   [facility]  [phone] °   [facility]  832-4777 °   [facility]  [phone] °   [facility]  272-1050 ° °Psychological Services °[facility]  [phone] °[facility]  [phone] °[facility]   [phone] (emergency services 641-4993) ° °Substance Abuse Resources °[facility]  336-882-2125 °[facility] 336-784-9470 °[facility] 336-285-9073 °[facility] 336-845-3988 °Residential & Outpatient Substance Abuse Program  800-659-3381 ° °Abuse/Neglect °[place] [facility] (336) 641-3795 °[place] [facility] Hot[facility] 800-378-5315 (After Hours) ° °Emergency Shelter °[facility] (336) 271-5985 ° °Maternity Homes °[facility] (336) 275-9566 °[facility] (704) 372-4663 ° °MRSA Hotline #:   832-7006 ° ° ° °[place] [facility] ° °[facility]     [employer]                            [facility] °[address] [place]                       [address]      [address]  °[place]                                                [place]                            [place] °Phone:  349-3220                                   Phone:  [phone]                 Phone:  342-8140 ° °[facility] °Phone:  342-8316 ° °[place] [facility] Hotline °(336) 342-1394 °(336) 342-3537 (After Hours) ° ° ° ° °

## 2013-10-19 NOTE — ED Notes (Signed)
Pt states that he has had dental pain x 5 months.  Has not seen a dentist due to finances.

## 2013-10-19 NOTE — ED Provider Notes (Signed)
CSN: [id number]     Arrival date & time 10/19/13  1709 History  This chart was scribed for non-physician practitioner Arthor CaptainAbigail Aleyssa Pike, working with Junius ArgyleForrest S Harrison, MD by Carl Bestelina Holson, ED Scribe. This patient was seen in room WTR5/WTR5 and the patient's care was started at 6:16 PM.    Chief Complaint  Patient presents with  . Dental Pain    Patient is a 28 y.o. male presenting with tooth pain. The history is provided by the patient. No language interpreter was used.  Dental Pain Associated symptoms: no facial swelling and no fever    HPI Comments: Duane Price is a 28 y.o. male who presents to the Emergency Department complaining of constant, aching, left-sided dental pain that started five months ago.  He states that when the pain initially came on, the dental pain would increase in severity but eventually went away.  He states that the pain has returned and is again aggravated when he eats or drinks something.  He denies facial swelling, fever, trouble swallowing, and difficulty breathing as associated symptoms.  The patient states that he still has his wisdom teeth.  The patient is a smoker.    History reviewed. No pertinent past medical history. Past Surgical History  Procedure Laterality Date  . Facial surgery     No family history on file. History  Substance Use Topics  . Smoking status: Current Every Day Smoker -- 0.50 packs/day  . Smokeless tobacco: Not on file  . Alcohol Use: Yes    Review of Systems  Constitutional: Negative for fever.  HENT: Positive for dental problem. Negative for facial swelling and trouble swallowing.   Respiratory: Negative for shortness of breath.   All other systems reviewed and are negative.    Allergies  Review of patient's allergies indicates no known allergies.  Home Medications  No current outpatient prescriptions on file.  Triage Vitals: BP 128/64  Pulse 72  Temp(Src) 97.4 F (36.3 C) (Oral)  SpO2 98%  Physical Exam   Nursing note and vitals reviewed. Constitutional: He is oriented to person, place, and time. He appears well-developed and well-nourished. No distress.  HENT:  Head: Normocephalic and atraumatic.  Mouth/Throat: Oropharynx is clear and moist.  Multiple dental carries specifically on 2, 8, and 13 on the right.  Gingival ertyhema and swelling.  No gross abscess.    Eyes: EOM are normal.  Neck: Neck supple. No tracheal deviation present.  Cardiovascular: Normal rate.   Pulmonary/Chest: Effort normal. No respiratory distress.  Musculoskeletal: Normal range of motion.  Neurological: He is alert and oriented to person, place, and time.  Skin: Skin is warm and dry.  Psychiatric: He has a normal mood and affect. His behavior is normal.    ED Course  Procedures (including critical care time)  DIAGNOSTIC STUDIES: Oxygen Saturation is 98% on room air, normal by my interpretation.    COORDINATION OF CARE: 6:21 PM- Advised the patient to see a dentist for a follow-up.  Discussed discharging the patient with pain medication and an antibiotic.  The patient agreed to the treatment plan.    Labs Review Labs Reviewed - No data to display Imaging Review No results found.  EKG Interpretation   None       MDM   1. Pain, dental    Patient with toothache.  No gross abscess.  Exam unconcerning for Ludwig's angina or spread of infection.  Will treat with penicillin and pain medicine.  Urged patient to follow-up with dentist.  I personally performed the services described in this documentation, which was scribed in my presence. The recorded information has been reviewed and is accurate.     Arthor Captain, PA-C 10/20/13 2128

## 2013-10-21 NOTE — ED Provider Notes (Signed)
Medical screening examination/treatment/procedure(s) were performed by non-physician practitioner and as supervising physician I was immediately available for consultation/collaboration.  EKG Interpretation   None         Junius ArgyleForrest S Cranston Koors, MD 10/21/13 1112

## 2013-11-08 ENCOUNTER — Emergency Department (HOSPITAL_COMMUNITY)
Admission: EM | Admit: 2013-11-08 | Discharge: 2013-11-08 | Disposition: A | Discharge status: Home / Self Care | Payer: Self-pay | Attending: Emergency Medicine | Admitting: Emergency Medicine

## 2013-11-08 ENCOUNTER — Encounter (HOSPITAL_COMMUNITY): Payer: Self-pay | Admitting: Emergency Medicine

## 2013-11-08 DIAGNOSIS — Z792 Long term (current) use of antibiotics: Secondary | ICD-10-CM | POA: Insufficient documentation

## 2013-11-08 DIAGNOSIS — K089 Disorder of teeth and supporting structures, unspecified: Secondary | ICD-10-CM | POA: Insufficient documentation

## 2013-11-08 DIAGNOSIS — F17200 Nicotine dependence, unspecified, uncomplicated: Secondary | ICD-10-CM | POA: Insufficient documentation

## 2013-11-08 DIAGNOSIS — K0889 Other specified disorders of teeth and supporting structures: Secondary | ICD-10-CM

## 2013-11-08 DIAGNOSIS — R51 Headache: Secondary | ICD-10-CM | POA: Insufficient documentation

## 2013-11-08 DIAGNOSIS — R519 Headache, unspecified: Secondary | ICD-10-CM

## 2013-11-08 MED ORDER — HYDROCODONE-ACETAMINOPHEN 5-325 MG PO TABS: 2.0000 | ORAL_TABLET | ORAL | Status: DC | PRN

## 2013-11-08 MED ORDER — HYDROCODONE-ACETAMINOPHEN 5-325 MG PO TABS
1.0000 | ORAL_TABLET | Freq: Once | ORAL | Status: AC
Start: 2013-11-08 — End: 2013-11-08
  Administered 2013-11-08: 1 via ORAL
  Filled 2013-11-08: qty 1

## 2013-11-08 MED ORDER — PENICILLIN V POTASSIUM 500 MG PO TABS: 500.0000 mg | ORAL_TABLET | Freq: Four times a day (QID) | ORAL | Status: DC

## 2013-11-08 NOTE — ED Notes (Signed)
Pt from home c/o L lower tooth pain x4 months. Pt reports that he is having L jaw pain and HA with tooth ache. Pt is A&O and in NAD

## 2013-11-08 NOTE — ED Provider Notes (Signed)
CSN: [id number]     Arrival date & time 11/08/13  [id number] History   First MD Initiated Contact with Patient 11/08/13 [phone]     Chief Complaint  Patient presents with  . Dental Pain      HPI  Patient presents with pain in the left lower first molar, tooth.  It has been painful for several months.  He, states he "can't afford a dentist.".All along the side of his face and was.  No swelling.  No difficulty swallowing, or speaking or breathing  History reviewed. No pertinent past medical history. Past Surgical History  Procedure Laterality Date  . Facial surgery     No family history on file. History  Substance Use Topics  . Smoking status: Current Every Day Smoker -- 0.50 packs/day  . Smokeless tobacco: Not on file  . Alcohol Use: Yes    Review of Systems  HENT: Positive for dental problem. Negative for drooling, facial swelling, mouth sores, sore throat, trouble swallowing and voice change.       Allergies  Review of patient's allergies indicates no known allergies.  Home Medications   Current Outpatient Rx  Name  Route  Sig  Dispense  Refill  . amoxicillin (AMOXIL) 500 MG capsule   Oral   Take 1 capsule (500 mg total) by mouth 3 (three) times daily.   30 capsule   0   . HYDROcodone-acetaminophen (NORCO) 5-325 MG per tablet   Oral   Take 1-2 tablets by mouth every 6 (six) hours as needed for moderate pain.   20 tablet   0   . HYDROcodone-acetaminophen (NORCO/VICODIN) 5-325 MG per tablet   Oral   Take 2 tablets by mouth every 4 (four) hours as needed.   10 tablet   0   . penicillin v potassium (VEETID) 500 MG tablet   Oral   Take 1 tablet (500 mg total) by mouth 4 (four) times daily.   40 tablet   0    BP 112/63  Pulse 86  Temp(Src) 98 F (36.7 C) (Oral)  Resp 20  SpO2 97% Physical Exam  HENT:  Mouth/Throat:      ED Course  Procedures (including critical care time) Labs Review Labs Reviewed - No data to display Imaging Review No results  found.  EKG Interpretation   None       MDM   Final diagnoses:  Pain, dental  Headache    History reviewed with him that he needs dental attention.  Given a single dose of pain medication, and penicillin here.  Personal prescription.  Discharge home given the information for her dentist on call.    Rolland PorterMark Janayia Burggraf, MD 11/08/13 1012

## 2013-11-08 NOTE — Discharge Instructions (Signed)

## 2013-11-23 ENCOUNTER — Emergency Department (HOSPITAL_COMMUNITY)
Admission: EM | Admit: 2013-11-23 | Discharge: 2013-11-23 | Disposition: A | Discharge status: Home / Self Care | Payer: Self-pay | Attending: Emergency Medicine | Admitting: Emergency Medicine

## 2013-11-23 ENCOUNTER — Encounter (HOSPITAL_COMMUNITY): Payer: Self-pay | Admitting: Emergency Medicine

## 2013-11-23 DIAGNOSIS — K089 Disorder of teeth and supporting structures, unspecified: Secondary | ICD-10-CM | POA: Insufficient documentation

## 2013-11-23 DIAGNOSIS — Z792 Long term (current) use of antibiotics: Secondary | ICD-10-CM | POA: Insufficient documentation

## 2013-11-23 DIAGNOSIS — F17200 Nicotine dependence, unspecified, uncomplicated: Secondary | ICD-10-CM | POA: Insufficient documentation

## 2013-11-23 DIAGNOSIS — G8929 Other chronic pain: Secondary | ICD-10-CM | POA: Insufficient documentation

## 2013-11-23 DIAGNOSIS — K029 Dental caries, unspecified: Secondary | ICD-10-CM | POA: Insufficient documentation

## 2013-11-23 MED ORDER — HYDROCODONE-ACETAMINOPHEN 5-325 MG PO TABS: 1.0000 | ORAL_TABLET | ORAL | Status: DC | PRN

## 2013-11-23 MED ORDER — HYDROCODONE-ACETAMINOPHEN 5-325 MG PO TABS
2.0000 | ORAL_TABLET | Freq: Once | ORAL | Status: AC
  Administered 2013-11-23: 2 via ORAL
  Filled 2013-11-23: qty 2

## 2013-11-23 MED ORDER — IBUPROFEN 800 MG PO TABS
800.0000 mg | ORAL_TABLET | Freq: Three times a day (TID) | ORAL | Status: DC
Start: 2013-11-23 — End: 2013-12-21

## 2013-11-23 NOTE — ED Notes (Signed)
Pt c/o lower left tooth pain that started getting worse over the past three days. Pt states he has liquid oral gel that isnt helping the pain.

## 2013-11-23 NOTE — ED Provider Notes (Signed)
CSN: [id number]     Arrival date & time 11/23/13  1548 History   This chart was scribed for non-physician practitioner Thomasenia Sales, working with Rolland Porter, MD, by Lewanda Rife ED Scribe. This patient was seen in room WTR8/WTR8 and the patient's care was started at 6:21 PM.  First MD Initiated Contact with Patient 11/23/13 1747     Chief Complaint  Patient presents with  . Dental Pain    The history is provided by the patient. No language interpreter was used.    HPI Comments: Duane Price is a 28 y.o. male who presents to the Emergency Department complaining of constant left-sided, lower dental pain onset chronic and worsening for past 3 days. Reports he was seen here for the same 11/08/13 for the same. Denies any aggravating or alleviating factors.Reports trying Orajel, goody powders, and prescribed abx with no relief of symptoms. Patient states he completed his antibiotics. Denies associated fever, difficulty swallowing, sore throat, otalgia, and facial swelling. The pt is a current smoker. States he cannot afford a Education officer, community.   History reviewed. No pertinent past medical history. Past Surgical History  Procedure Laterality Date  . Facial surgery     No family history on file. History  Substance Use Topics  . Smoking status: Current Every Day Smoker -- 0.50 packs/day  . Smokeless tobacco: Not on file  . Alcohol Use: Yes    Review of Systems  Constitutional: Negative for fever.  HENT: Positive for dental problem. Negative for trouble swallowing.   Psychiatric/Behavioral: Negative for confusion.  All other systems reviewed and are negative.   Allergies  Review of patient's allergies indicates no known allergies.  Home Medications   Current Outpatient Rx  Name  Route  Sig  Dispense  Refill  . Aspirin-Acetaminophen-Caffeine (GOODY HEADACHE PO)   Oral   Take 1 packet by mouth every 4 (four) hours as needed (pain).         . benzocaine (ORAJEL) 10 %  mucosal gel   Mouth/Throat   Use as directed 1 application in the mouth or throat as needed for mouth pain.         Marland Kitchen HYDROcodone-acetaminophen (NORCO/VICODIN) 5-325 MG per tablet   Oral   Take 2 tablets by mouth every 4 (four) hours as needed.   10 tablet   0   . penicillin v potassium (VEETID) 500 MG tablet   Oral   Take 1 tablet (500 mg total) by mouth 4 (four) times daily.   40 tablet   0    Triage Vitals: BP 129/79  Pulse 83  Temp(Src) 97.8 F (36.6 C) (Oral)  Resp 18  SpO2 98%  Filed Vitals:   11/23/13 1613  BP: 129/79  Pulse: 83  Temp: 97.8 F (36.6 C)  TempSrc: Oral  Resp: 18  SpO2: 98%     Physical Exam  Nursing note and vitals reviewed. Constitutional: He is oriented to person, place, and time. He appears well-developed and well-nourished. No distress.  HENT:  Head: Normocephalic and atraumatic.  Right Ear: External ear normal.  Left Ear: External ear normal.  Nose: Nose normal.  Mouth/Throat: Uvula is midline, oropharynx is clear and moist and mucous membranes are normal. No trismus in the jaw. Abnormal dentition. Dental caries present. No oropharyngeal exudate, posterior oropharyngeal edema or posterior oropharyngeal erythema.    Poor dentition throughout. Dental cary present to the left lower first molar with no surrounding edema to the gingival tissue. No erythema to the  posterior pharynx. Tonsils without edema or exudates. Uvula midline. No trismus. No difficulty controlling secretions. No palpable facial masses or edema.  Eyes: Conjunctivae and EOM are normal. Pupils are equal, round, and reactive to light.  Neck: Normal range of motion. Neck supple. No tracheal deviation present.  No cervical spinal or paraspinal tenderness to palpation throughout.  No limitations with neck ROM.  No submental fullness. No LAD  Cardiovascular: Normal rate, regular rhythm and normal heart sounds.  Exam reveals no gallop and no friction rub.   No murmur  heard. Pulmonary/Chest: Effort normal and breath sounds normal. No respiratory distress. He has no wheezes. He has no rales. He exhibits no tenderness.  Abdominal: Soft. He exhibits no distension. There is no tenderness.  Musculoskeletal: Normal range of motion. He exhibits no edema and no tenderness.  Neurological: He is alert and oriented to person, place, and time.  Skin: Skin is warm and dry. He is not diaphoretic.  Psychiatric: He has a normal mood and affect. His behavior is normal.    ED Course  Procedures (including critical care time)  COORDINATION OF CARE:  Nursing notes reviewed. Vital signs reviewed. Initial pt interview and examination performed.   6:22 PM-Discussed work up plan with pt at bedside. Pt agrees with plan.   Treatment plan initiated:Medications - No data to display   Initial diagnostic testing ordered.    Labs Review Labs Reviewed - No data to display Imaging Review No results found.   EKG Interpretation None      MDM   Delano Metzhomas Masi is a 28 y.o. male who presents to the Emergency Department complaining of constant left-sided, lower dental pain onset chronic and worsening for past 3 days.who presents to the Emergency Department complaining of constant left-sided, lower dental pain onset chronic and worsening for past 3 days. Etiology of dental pain possibly due to dental caries. No dental abscess to be drained at this time. No concerning signs/symptoms for Ludwig's Angina at this time. Patient afebrile and non-toxic in appearance. Patient instructed to follow-up with a dentist for further evaluation and management. Already completed antibiotic. Resources provided. Informed patient the ED is not for chronic pain management. Given short course of Vicodin (4 pills) and encouraged to call a dentist tomorrow. Return precautions were given.  Patient in agreement with discharge and plan.    Discharge Medication List as of 11/23/2013  6:44 PM    START taking  these medications   Details  !! HYDROcodone-acetaminophen (NORCO/VICODIN) 5-325 MG per tablet Take 1-2 tablets by mouth every 4 (four) hours as needed., Starting 11/23/2013, Until Discontinued, Print    ibuprofen (ADVIL,MOTRIN) 800 MG tablet Take 1 tablet (800 mg total) by mouth 3 (three) times daily., Starting 11/23/2013, Until Discontinued, Print     !! - Potential duplicate medications found. Please discuss with provider.      Final impressions: 1. Pain due to dental caries       Luiz IronJessica Katlin Yurika Pereda PA-C   I personally performed the services described in this documentation, which was scribed in my presence. The recorded information has been reviewed and is accurate.        Jillyn LedgerJessica K Lynea Rollison, PA-C 11/24/13 1316

## 2013-11-23 NOTE — Discharge Instructions (Signed)
Follow-up with your dentist as soon as possible!  Take Ibuprofen for mild-moderate pain  Take Vicodin for severe pain - Please be careful with this medication.  It can cause drowsiness.  Use caution while driving, operating machinery, drinking alcohol, or any other activities that may impair your physical or mental abilities.   Return to the emergency department if you develop any changing/worsening condition, neck pain/swelling, difficulty swallowing/breathing, fever or any other concerns (please read additional information regarding your condition below)   Dental Pain A tooth ache may be caused by cavities (tooth decay). Cavities expose the nerve of the tooth to air and hot or cold temperatures. It may come from an infection or abscess (also called a boil or furuncle) around your tooth. It is also often caused by dental caries (tooth decay). This causes the pain you are having. DIAGNOSIS  Your caregiver can diagnose this problem by exam. TREATMENT   If caused by an infection, it may be treated with medications which kill germs (antibiotics) and pain medications as prescribed by your caregiver. Take medications as directed.  Only take over-the-counter or prescription medicines for pain, discomfort, or fever as directed by your caregiver.  Whether the tooth ache today is caused by infection or dental disease, you should see your dentist as soon as possible for further care. SEEK MEDICAL CARE IF: The exam and treatment you received today has been provided on an emergency basis only. This is not a substitute for complete medical or dental care. If your problem worsens or new problems (symptoms) appear, and you are unable to meet with your dentist, call or return to this location. SEEK IMMEDIATE MEDICAL CARE IF:   You have a fever.  You develop redness and swelling of your face, jaw, or neck.  You are unable to open your mouth.  You have severe pain uncontrolled by pain medicine. MAKE SURE  YOU:   Understand these instructions.  Will watch your condition.  Will get help right away if you are not doing well or get worse. Document Released: 09/11/2005 Document Revised: 12/04/2011 Document Reviewed: 04/29/2008 [facility] Patient Information 2014 [place], [place].   Emergency Department Resource Guide 1) Find a Doctor and Pay Out of Pocket Although you won't have to find out who is covered by your insurance plan, it is a good idea to ask around and get recommendations. You will then need to call the office and see if the doctor you have chosen will accept you as a new patient and what types of options they offer for patients who are self-pay. Some doctors offer discounts or will set up payment plans for their patients who do not have insurance, but you will need to ask so you aren't surprised when you get to your appointment.  2) Contact Your Local Health Department Not all health departments have doctors that can see patients for sick visits, but many do, so it is worth a call to see if yours does. If you don't know where your local health department is, you can check in your phone book. The CDC also has a tool to help you locate your state's health department, and many state websites also have listings of all of their local health departments.  3) Find a Walk-in Clinic If your illness is not likely to be very severe or complicated, you may want to try a walk in clinic. These are popping up all over the country in pharmacies, drugstores, and shopping centers. They're usually staffed by nurse practitioners  or physician assistants that have been trained to treat common illnesses and complaints. They're usually fairly quick and inexpensive. However, if you have serious medical issues or chronic medical problems, these are probably not your best option.  No Primary Care Doctor: - Call Health Connect at  [phone] - they can help you locate a primary care doctor that  accepts your insurance,  provides certain services, etc. - Physician Referral Service- [phone]  Chronic Pain Problems: Organization         Address  Phone   Notes  [facility]  [phone] Patients need to be referred by their primary care doctor.   Medication Assistance: Organization         Address  Phone   Notes  [facility] Medication [facility] [address] [place], [place] [address] [phone] --Must be a resident of [place] -- Must have NO insurance coverage whatsoever (no Medicaid/ Medicare, etc.) -- The pt. MUST have a primary care doctor that directs their care regularly and follows them in the community   MedAssist  [phone]   [employer]  [phone]    Agencies that provide inexpensive medical care: Organization         Address  Phone   Notes  [facility]  [phone]   [facility]    [phone]   [facility] [address] [place], [place] [address] [phone]) [phone]   [facility] [address], [place] [phone]) [phone]   [facility]anned Parenthood    [phone]   [facility]    [phone](336) [phone]   [facility]  [address], [place] Phone:  [phone](336) [phone], Fax:  [phone] Hours of Operation:  9 am - 6 pm, M-F.  Also accepts Medicaid/Medicare and self-pay.  [facility]  [address], [place] Phone: [phone](336) [phone], Fax: [phone]. Hours of Operation:  8:30 am - 5:30 pm, M-F.  Also accepts Medicaid and self-pay.  [facility] [place] [address], [place] Phone: [phone](336) [phone]   [facility] [address], [place], [place] [phone], Ext. [phone] Mondays & Thursdays: 7-9 AM.  First 15 patients are seen on a first come, first serve basis.    Medicaid-accepting [facility] Providers:  Organization         Address  Phone    Notes  [facility] [address],  [phone] Also accepts self-pay patients.  [facility] [address], [place]  [phone]   [facility] [address], [place] [phone]) [phone]   [facility] [address] [place] [address], [place] [phone]   Renaye RakersVeita Bland [address], [place]   [phone] Only accepts [place] Access [place] patients after they have their name applied to their card.   Self-Pay (no insurance) in [facility] [place]:  Organization         Address  Phone   Notes  Sickle Cell Patients, [facility] [address], [place] [phone]   [facility] [address], [place] [phone]) [phone]   [facility] [place]  [address], [place] [phone]   [facility]/Dr. Julio Sickssei-Bonsu  [phone]  [place], [place] or [address], [place] [phone] Phone number for both [place] and [place] locations is the same.  [facility] [address] [place] [phone]   [facility] [address], [place] or [address] [phone]   [facility] [address] [place] [phone], phone; [phone], fax Sees patients 1st and 3rd Saturday of every month.  Must not qualify for public or private insurance (i.e. Medicaid, Medicare, [place] Health Choice, Veterans' Benefits)  Household income should be no more than 200% of the poverty level The clinic cannot treat you if you are pregnant or think you are pregnant  Sexually transmitted diseases are not treated at the clinic.    Dental Care: Organization         Address  Phone  Notes  [facility] Department of [facility] [address] [place], [place] [phone] Accepts children up to age 34 who are enrolled in [place] or [place] Health Choice; pregnant women with a Medicaid card; and children who have applied for Medicaid or [place] Health Choice, but were declined, whose parents can pay a reduced fee at time of service.  [facility] Department of [facility]  [address], [place] [phone] Accepts children up to age 45 who are enrolled in [place] or [place] Health Choice; pregnant women with a Medicaid card; and children who have applied for Medicaid or [place] Health Choice, but were declined, whose parents can pay a reduced fee at time of service.  [facility]  [address] [place], [place] [phone] Patients are seen by appointment only. Walk-ins are not accepted. [facility] will see patients 18 years of age and older. Monday - Tuesday (8am-5pm) Most Wednesdays (8:30-5pm) $30 per visit, cash only  [facility] Adult Dental Access PROGRAM  [address], [facility] [phone] Patients are seen by appointment only. Walk-ins are not accepted. [facility] will see patients 104 years of age and older. One Wednesday Evening (Monthly: Volunteer Based).  $30 per visit, cash only  [employer]  [phone] for adults; Children under age 94, call Graduate Pediatric Dentistry at [phone]. Children aged 34-14, please call [phone] to request a pediatric application.  Dental services are provided in all areas of dental care including fillings, crowns and bridges, complete and partial dentures, implants, gum treatment, root canals, and extractions. Preventive care is also provided. Treatment is provided to both adults and children. Patients are selected via a lottery and there is often a waiting list.   [facility] [address], [place]  [phone] [email or web address]   [facility] [address] [place], [place] [phone], Ext.  123 Second and Fourth Thursday of each month, opens at 6:30 AM; Clinic ends at 9 AM.  Patients are seen on a first-come first-served basis, and a limited number are seen during each clinic.   [facility]  [address] [place], [place] [phone]   Eligibility Requirements You must have lived in [place], [place], or [place] counties for at least the last three months.   You cannot be eligible for state or federal sponsored [place], including [employer], [place], or [employer].   You generally cannot be eligible for healthcare insurance through your employer.  How to apply: Eligibility screenings are held every Tuesday and Wednesday afternoon from 1:00 pm until 4:00 pm. You do not need an appointment for the interview!  [facility] [address] [place], [place] [phone]   [facility]  [phone]   [facility] Health Department  [phone]   [facility] Health Department  [phone]    Behavioral Health Resources in the Community: Intensive Outpatient Programs Organization         Address  Phone  Notes  [facility] Services [address], [place], [place] [phone]   [facility] Outpatient [address], [place], [place] [phone]   [facility] [address], [place], [place]  [phone]   [facility] [address] [place] [address]  [place], [place] [phone] or [phone]   Substance Abuse Resources Organization         Address  Phone  Notes  [facility] Drug Services  [phone]   [facility]  [phone]   [facility] [place]  [phone]   Floydene Flock  [phone]   Residential & Outpatient Substance Abuse Program  [phone]   Psychological Services Organization         Address  Phone  Notes  [facility]  [phone]   [facility] Services  [phone]   [facility]  Mental Health [address] [place] [phone] or [phone]    Mobile Crisis Teams Organization         Address  Phone  Notes  [facility], Mobile Crisis Care Unit  [phone]   [facility]  [address] [place], [place] [phone]   Doristine Locks [address] [place] [phone]    Self-Help/Support Groups Organization         Address  Phone             Notes  [facility] [place] - variety of support groups  336- I7437963 Call for more information  Narcotics Anonymous (NA), [facility] [address], [employer] [place]  2 meetings at this location   Statistician         Address  Phone  Notes  [facility] [address],    [place]  [phone]   [facility]  [address], [place] [address], [place], [place] [phone]   [facility] [address] [place], [place] [phone] Admissions: 8am-3pm M-F  [facility] [address],    [place], [place] [phone]   [facility] [address] [place], [place], [place] [phone]   [facility] [address]  [place], [place] [phone]   [facility] - Intensive Outpatient [address], [place], [place] [phone]   [facility] ([facility] [address]  [place], [place] [phone] or [phone]   [facility] ([facility]) [address], [place], [place] [phone] Accepts Medicaid  [employer] [address],  [place] [phone] Substance Abuse/Addiction Treatment   [facility] Organization         Address  Phone  Notes  [facility]  [phone]   Angie Fava, PhD [address] [place], [place]   2056162947 or [phone]   [facility]al   [address] [place], [place] [phone]   [facility] [address], [place], [place] [phone]) [phone] Insurance/Medicaid/sponsorship through [facility]  °[facility] [address]                                    [place], [place] [phone]) [phone] Therapy/tele-psych/case  °[facility] [address]  ° [place], [place] [phone]) [phone]    °Dr. Arfeen  ([phone]   °[facility] [place]  [employer] [place] [facility] 1) [address], [place] °2) [address], [place] °3)  [address], [place] ([phone]) [phone] °(336) [phone] ° °([phone]) [phone]   °[place] Child Abuse Hotline (336) 342-1394 or (336) [phone] (After Hours)    ° ° ° °

## 2013-12-02 NOTE — ED Provider Notes (Signed)
Medical screening examination/treatment/procedure(s) were performed by non-physician practitioner and as supervising physician I was immediately available for consultation/collaboration.   EKG Interpretation None        Darrin Apodaca, MD 12/02/13 1635 

## 2013-12-21 ENCOUNTER — Encounter (HOSPITAL_COMMUNITY): Payer: Self-pay | Admitting: Emergency Medicine

## 2013-12-21 ENCOUNTER — Emergency Department (HOSPITAL_COMMUNITY)
Admission: EM | Admit: 2013-12-21 | Discharge: 2013-12-21 | Disposition: A | Discharge status: Home / Self Care | Payer: Self-pay | Attending: Emergency Medicine | Admitting: Emergency Medicine

## 2013-12-21 DIAGNOSIS — G8929 Other chronic pain: Secondary | ICD-10-CM | POA: Insufficient documentation

## 2013-12-21 DIAGNOSIS — K089 Disorder of teeth and supporting structures, unspecified: Secondary | ICD-10-CM | POA: Insufficient documentation

## 2013-12-21 DIAGNOSIS — K0889 Other specified disorders of teeth and supporting structures: Secondary | ICD-10-CM

## 2013-12-21 DIAGNOSIS — F17200 Nicotine dependence, unspecified, uncomplicated: Secondary | ICD-10-CM | POA: Insufficient documentation

## 2013-12-21 MED ORDER — OXYCODONE-ACETAMINOPHEN 5-325 MG PO TABS: 1.0000 | ORAL_TABLET | ORAL | Status: DC | PRN

## 2013-12-21 MED ORDER — OXYCODONE-ACETAMINOPHEN 5-325 MG PO TABS
2.0000 | ORAL_TABLET | Freq: Once | ORAL | Status: AC
  Administered 2013-12-21: 2 via ORAL
  Filled 2013-12-21: qty 2

## 2013-12-21 MED ORDER — PENICILLIN V POTASSIUM 500 MG PO TABS: 500.0000 mg | ORAL_TABLET | Freq: Three times a day (TID) | ORAL | Status: DC

## 2013-12-21 NOTE — ED Notes (Signed)
Pt from home c/o back lower right dental pain that began 1 week ago, he would like a referral to a dentist.

## 2013-12-21 NOTE — ED Provider Notes (Signed)
CSN: [id number]     Arrival date & time 12/21/13  1146 History   First MD Initiated Contact with Patient 12/21/13 1200    This chart was scribed for non-physician practitioner, Arthor CaptainAbigail Jafet Wissing, PA-C  working with [name], MD by Marica OtterNusrat Rahman, ED Scribe. This patient was seen in room WTR9/WTR9 and the patient's care was started at 1:00 PM.  Chief Complaint  Patient presents with  . Dental Pain   HPI HPI Comments: Duane [place] is a 28 y.o. male who presents to the Emergency Department complaining of intermittent lower right dental pain onset one week ago. Pt reports that he used goody powder and Ibuprofen without relief. Pt reports that eating and drinking makes the pain worse, however, notes that he does not seem to experience any special sensitivity to temperature. Pt reports the pain radiates to the back right jaw, however, pt notes that this is a chronic pain. Pt denies fever, chills and nausea.   History reviewed. No pertinent past medical history. Past Surgical History  Procedure Laterality Date  . Facial surgery     No family history on file. History  Substance Use Topics  . Smoking status: Current Every Day Smoker -- 0.50 packs/day  . Smokeless tobacco: Not on file  . Alcohol Use: Yes    Review of Systems  Constitutional: Negative for fever and chills.  HENT: Positive for dental problem.   Gastrointestinal: Negative for nausea.      Allergies  Review of patient's allergies indicates no known allergies.  Home Medications  No current outpatient prescriptions on file. BP 155/76  Pulse 74  Temp(Src) 97.6 F (36.4 C) (Oral)  Resp 16  SpO2 98% Physical Exam  Nursing note and vitals reviewed. Constitutional: He is oriented to person, place, and time. He appears well-developed and well-nourished. No distress.  HENT:  Head: Normocephalic and atraumatic.  Mouth/Throat: Oropharynx is clear and moist.  Second molar on right lower side is decayed with open pulp. Mild  gingival erythema no abcess. pharynx is moist and clear; and mild tonsil arthropathy.   Eyes: EOM are normal.  Neck: Neck supple. No tracheal deviation present.  Cardiovascular: Normal rate.   Pulmonary/Chest: Effort normal. No respiratory distress.  Musculoskeletal: Normal range of motion.  Neurological: He is alert and oriented to person, place, and time.  Skin: Skin is warm and dry.  Psychiatric: He has a normal mood and affect. His behavior is normal.    ED Course  Procedures (including critical care time) DIAGNOSTIC STUDIES: Oxygen Saturation is 98% on RA, adequate by my interpretation.    COORDINATION OF CARE:  1:05 PM-Discussed treatment plan which includes pain meds and dental referral with pt at bedside and pt agreed to plan.   Labs Review Labs Reviewed - No data to display Imaging Review No results found.   EKG Interpretation None      MDM   Final diagnoses:  Pain, dental    Patient with toothache.  No gross abscess.  Exam unconcerning for Ludwig's angina or spread of infection.  Will treat with penicillin and pain medicine.  Urged patient to follow-up with dentist.     I personally performed the services described in this documentation, which was scribed in my presence. The recorded information has been reviewed and is accurate.   Arthor CaptainAbigail Ravynn Hogate, PA-C 12/22/13 [phone]

## 2013-12-21 NOTE — Discharge Instructions (Signed)
You have been diagnosed with Dental pain. Please call the follow up dentist first thing in the morning on Monday for a follow up appointment. Keep your discharge paperwork from today's visit to bring to the dentist office. You may also use the resource guide listed below to help you find a dentist if you do not already have one to followup with. It is very important that you get evaluated by a dentist as soon as possible.  Use your pain medication as prescribed and do not operate heavy machinery while on pain medication. Note that your pain medication contains acetaminophen (Tylenol) & its is not reccommended that you use additional acetaminophen (Tylenol) while taking this medication. Take your full course of antibiotics. Read the instructions below. ° °Eat a soft or liquid diet and rinse your mouth out after meals with warm water. You should see a dentist or return here at once if you have increased swelling, increased pain or uncontrolled bleeding from the site of your injury. ° ° °SEEK MEDICAL CARE IF:  °· You have increased pain not controlled with medicines.  °· You have swelling around your tooth, in your face or neck.  °· You have bleeding which starts, continues, or gets worse.  °· You have a fever >101 °· If you are unable to open your mouth °Soft Diet  °The soft diet may be recommended after you were put on a full liquid diet. A normal diet may follow. The soft diet can also be used after surgery if you are too ill to keep down a normal diet. The soft diet may also be needed if you have a hard time chewing foods.  °DESCRIPTION  °Tender foods are used. Foods do not need to be ground or pureed. Most raw fruits and vegetables and coarse breads and cereals should be avoided. Fried foods and highly seasoned foods may cause discomfort.  °NUTRITIONAL ADEQUACY  °A healthy diet is possible if foods from each of the basic food groups are eaten daily.  °SOFT DIET FOOD LISTS  °Milk/Dairy  °Allowed: Milk and milk  drinks, milk shakes, cream cheese, cottage cheese, mild cheeses.  °Avoid: Sharp or highly seasoned cheese. °Meat/Meat Substitutes  °Allowed: Broiled, roasted, baked, or stewed tender lean beef, mutton, lamb, veal, chicken, turkey, liver, ham, crisp bacon, white fish, tuna, salmon. Eggs, smooth peanut butter.  °Avoid: All fried meats, fish, or fowl. Rich gravies and sauces. Lunch meats, sausages, hot dogs. Meats with gristle, chunky peanut butter. °Breads/Grains  °Allowed: Rice, noodles, spaghetti, macaroni. Dry or cooked refined cereals, such as farina, cream of wheat, oatmeal, grits, whole-wheat cereals. Plain or toasted white or wheat blend or whole-grain breads, soda crackers or saltines, flour tortillas.  °Avoid: Wild rice, coarse cereals, such as bran. Seed in or on breads and crackers. Bread or bread products with nuts or seeds. °Fruits/Vegetables  °Allowed: Fruit and vegetable juices, well-cooked or canned fruits and vegetables, any dried fruit. One citrus fruit daily, 1 vitamin A source daily. Well-ripened, easy to chew fruits, sweet potatoes. Baked, boiled, mashed, creamed, scalloped, or au gratin potatoes. Broths or creamed soups made with allowed vegetables, strained tomatoes.  °Avoid: All gas-forming vegetables (corn, radishes, Brussels sprouts, onions, broccoli, cabbage, parsnips, turnips, chili peppers, pinto beans, split peas, dried beans). Fruits containing seeds and skin. Potato chips and corn chips. All others that are not made with allowed vegetables. Highly seasoned soups. °Desserts/Sweets  °Allowed: Simple desserts, such as custard, junkets, gelatin desserts, plain ice cream and sherbets, simple cakes   and cookies, allowed fruits, sugar, syrup, jelly, honey, plain hard candy, and molasses.  °Avoid: Rich pastries, any dessert containing dates, nuts, raisins, or coconut. Fried pastries, such as doughnuts. Chocolate. °Beverages  °Allowed: Fruit and vegetable juices. Caffeine-free carbonated drinks,  coffee, and tea.  °Avoid: Caffeinated beverages: coffee, tea, soda or pop. °Miscellaneous  °Allowed: Butter, cream, margarine, mayonnaise, oil. Cream sauces, salt, and mild spices.  °Avoid: Highly spiced salad dressings. Highly seasoned foods, hot sauce, mustard, horseradish, and pepper. °SAMPLE MENU  °Breakfast  °Orange juice.  °Oatmeal.  °Soft cooked egg.  °Toast and margarine.  °2% milk.  °Coffee. °Lunch  °Meatloaf.  °Mashed potato.  °Green beans.  °Lemon pudding.  °Bread and margarine.  °Coffee. °Dinner  °Consommé or apricot nectar.  °Chicken breast.  °Rice, peas, and carrots.  °Applesauce.  °Bread and margarine.  °2% milk. °To cut the amount of fat in your diet, omit margarine and use 1% or skim milk.  °NUTRIENT ANALYSIS  °Calories........................1953 Kcal.  °Protein.........................102 gm.  °Carbohydrate...............247 gm.  °Fat................................65 gm.  °Cholesterol...................449 mg.  °Dietary fiber.................19 gm.  °Vitamin A.....................2944 RE.  °Vitamin C.....................79 mg.  °Niacin..........................25 mg.  °Riboflavin....................2.0 mg.  °Thiamin.......................1.5 mg.  °Folate..........................249 mcg.  °Calcium.......................1030 mg.  °Phosphorus.................1782 mg.  °Zinc..............................12 mg.  °Iron..............................13 mg.  °Sodium.........................299 mg.  °Potassium....................3046 mg. °Document Released: 12/19/2007 Document Revised: 12/04/2011 Document Reviewed: 12/19/2007  °Exit[employer]® Patient Information ©2014 [employer].  ° °RESOURCE GUIDE ° ° °Dental Problems ° °Dr. Janna Civilis °$200 dollar visit °[address] °[place], [place] [address]  °[phone] °  ° °Patients with Medicaid: °[facility]                     [facility] °[address]                                           [address] °Phone:   [phone]                                                  Phone:  510-2600 ° °If unable to pay or uninsured, contact:  [facility] or [facility] to become qualified for the adult dental clinic. ° °Chronic Pain Problems °Contact [facility]  [phone] °Patients need to be referred by their primary care doctor. ° °Insufficient Money for Medicine °Contact [employer]:  call "211" or [facility] 271-5999. ° °No Primary Care Doctor °Call [facility]  832-8000 °Other agencies that provide inexpensive medical care °   [facility]  [phone] °   [facility]  832-7272 °   [facility]  [phone] °   [facility]  832-4777 °   [facility]  [phone] °   [facility]  272-1050 ° °Psychological Services °[facility]  [phone] °[facility]  [phone] °[facility]   [phone] (emergency services 641-4993) ° °Substance Abuse Resources °[facility]  336-882-2125 °[facility] 336-784-9470 °[facility] 336-285-9073 °[facility] 336-845-3988 °Residential & Outpatient Substance Abuse Program  800-659-3381 ° °Abuse/Neglect °[place] [facility] (336) 641-3795 °[place] [facility] 800-378-5315 (After Hours) ° °Emergency Shelter °[facility] (336) 271-5985 ° °Maternity Homes °[facility] (336) 275-9566 °[facility] (704) 372-4663 ° °MRSA Hotline #:   832-7006 ° ° ° °[place] [facility] ° °[facility]     [employer]                          [facility] °[address] [place]                       [address]      [address]  °[place]                                                [place]                            [place] °Phone:  349-3220                                   Phone:  [phone]                 Phone:   342-8140 ° °[facility] °Phone:  342-8316 ° °[place] [facility] °(336) 342-1394 °(336) 342-3537 (After Hours) ° ° ° ° ° ° °

## 2013-12-24 NOTE — ED Provider Notes (Signed)
Medical screening examination/treatment/procedure(s) were performed by non-physician practitioner and as supervising physician I was immediately available for consultation/collaboration.   EKG Interpretation None        Rhyker Silversmith H Jahron Hunsinger, MD 12/24/13 1459 

## 2014-08-04 ENCOUNTER — Encounter (HOSPITAL_COMMUNITY): Payer: Self-pay | Admitting: Emergency Medicine

## 2014-08-04 ENCOUNTER — Emergency Department (HOSPITAL_COMMUNITY)
Admission: EM | Admit: 2014-08-04 | Discharge: 2014-08-04 | Disposition: A | Discharge status: Home / Self Care | Payer: Self-pay | Attending: Emergency Medicine | Admitting: Emergency Medicine

## 2014-08-04 DIAGNOSIS — K0889 Other specified disorders of teeth and supporting structures: Secondary | ICD-10-CM

## 2014-08-04 DIAGNOSIS — Z720 Tobacco use: Secondary | ICD-10-CM | POA: Insufficient documentation

## 2014-08-04 DIAGNOSIS — K088 Other specified disorders of teeth and supporting structures: Secondary | ICD-10-CM | POA: Insufficient documentation

## 2014-08-04 DIAGNOSIS — K029 Dental caries, unspecified: Secondary | ICD-10-CM | POA: Insufficient documentation

## 2014-08-04 DIAGNOSIS — Z792 Long term (current) use of antibiotics: Secondary | ICD-10-CM | POA: Insufficient documentation

## 2014-08-04 MED ORDER — HYDROCODONE-ACETAMINOPHEN 5-325 MG PO TABS: 1.0000 | ORAL_TABLET | ORAL | Status: DC | PRN

## 2014-08-04 MED ORDER — PENICILLIN V POTASSIUM 500 MG PO TABS
500.0000 mg | ORAL_TABLET | Freq: Once | ORAL | Status: AC
  Administered 2014-08-04: 500 mg via ORAL
  Filled 2014-08-04: qty 1

## 2014-08-04 MED ORDER — PENICILLIN V POTASSIUM 500 MG PO TABS: 500.0000 mg | ORAL_TABLET | Freq: Three times a day (TID) | ORAL | Status: DC

## 2014-08-04 MED ORDER — HYDROCODONE-ACETAMINOPHEN 5-325 MG PO TABS
1.0000 | ORAL_TABLET | Freq: Once | ORAL | Status: AC
  Administered 2014-08-04: 1 via ORAL
  Filled 2014-08-04: qty 1

## 2014-08-04 NOTE — ED Notes (Signed)
Pt alert, arrives from home, c/o dental pain, onset was several days ago, denies trauma or injury, resp even unlabored
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
## 2014-08-04 NOTE — Discharge Instructions (Signed)
Dental Caries °Dental caries (also called tooth decay) is the most common oral disease. It can occur at any age but is more common in children and young adults.  °HOW DENTAL CARIES DEVELOPS  °The process of decay begins when bacteria and foods (particularly sugars and starches) combine in your mouth to produce plaque. Plaque is a substance that sticks to the hard, outer surface of a tooth (enamel). The bacteria in plaque produce acids that attack enamel. These acids may also attack the root surface of a tooth (cementum) if it is exposed. Repeated attacks dissolve these surfaces and create holes in the tooth (cavities). If left untreated, the acids destroy the other layers of the tooth.  °RISK FACTORS °· Frequent sipping of sugary beverages.   °· Frequent snacking on sugary and starchy foods, especially those that easily get stuck in the teeth.   °· Poor oral hygiene.   °· Dry mouth.   °· Substance abuse such as methamphetamine abuse.   °· Broken or poor-fitting dental restorations.   °· Eating disorders.   °· Gastroesophageal reflux disease (GERD).   °· Certain radiation treatments to the head and neck. °SYMPTOMS °In the early stages of dental caries, symptoms are seldom present. Sometimes white, chalky areas may be seen on the enamel or other tooth layers. In later stages, symptoms may include: °· Pits and holes on the enamel. °· Toothache after sweet, hot, or cold foods or drinks are consumed. °· Pain around the tooth. °· Swelling around the tooth. °DIAGNOSIS  °Most of the time, dental caries is detected during a regular dental checkup. A diagnosis is made after a thorough medical and dental history is taken and the surfaces of your teeth are checked for signs of dental caries. Sometimes special instruments, such as lasers, are used to check for dental caries. Dental X-ray exams may be taken so that areas not visible to the eye (such as between the contact areas of the teeth) can be checked for cavities.    °TREATMENT  °If dental caries is in its early stages, it may be reversed with a fluoride treatment or an application of a remineralizing agent at the dental office. Thorough brushing and flossing at home is needed to aid these treatments. If it is in its later stages, treatment depends on the location and extent of tooth destruction:  °· If a small area of the tooth has been destroyed, the destroyed area will be removed and cavities will be filled with a material such as gold, silver amalgam, or composite resin.   °· If a large area of the tooth has been destroyed, the destroyed area will be removed and a cap (crown) will be fitted over the remaining tooth structure.   °· If the center part of the tooth (pulp) is affected, a procedure called a root canal will be needed before a filling or crown can be placed.   °· If most of the tooth has been destroyed, the tooth may need to be pulled (extracted). °HOME CARE INSTRUCTIONS °You can prevent, stop, or reverse dental caries at home by practicing good oral hygiene. Good oral hygiene includes: °· Thoroughly cleaning your teeth at least twice a day with a toothbrush and dental floss.   °· Using a fluoride toothpaste. A fluoride mouth rinse may also be used if recommended by your dentist or health care provider.   °· Restricting the amount of sugary and starchy foods and sugary liquids you consume.   °· Avoiding frequent snacking on these foods and sipping of these liquids.   °· Keeping regular visits with   a dentist for checkups and cleanings. °PREVENTION  °· Practice good oral hygiene. °· Consider a dental sealant. A dental sealant is a coating material that is applied by your dentist to the pits and grooves of teeth. The sealant prevents food from being trapped in them. It may protect the teeth for several years. °· Ask about fluoride supplements if you live in a community without fluorinated water or with water that has a low fluoride content. Use fluoride supplements  as directed by your dentist or health care provider. °· Allow fluoride varnish applications to teeth if directed by your dentist or health care provider. °Document Released: 06/03/2002 Document Revised: 01/26/2014 Document Reviewed: 09/13/2012 °ExitCare® Patient Information ©2015 [employer]. This information is not intended to replace advice given to you by your health care provider. Make sure you discuss any questions you have with your health care provider. ° °Dental Pain °A tooth ache may be caused by cavities (tooth decay). Cavities expose the nerve of the tooth to air and hot or cold temperatures. It may come from an infection or abscess (also called a boil or furuncle) around your tooth. It is also often caused by dental caries (tooth decay). This causes the pain you are having. °DIAGNOSIS  °Your caregiver can diagnose this problem by exam. °TREATMENT  °· If caused by an infection, it may be treated with medications which kill germs (antibiotics) and pain medications as prescribed by your caregiver. Take medications as directed. °· Only take over-the-counter or prescription medicines for pain, discomfort, or fever as directed by your caregiver. °· Whether the tooth ache today is caused by infection or dental disease, you should see your dentist as soon as possible for further care. °SEEK MEDICAL CARE IF: °The exam and treatment you received today has been provided on an emergency basis only. This is not a substitute for complete medical or dental care. If your problem worsens or new problems (symptoms) appear, and you are unable to meet with your dentist, call or return to this location. °SEEK IMMEDIATE MEDICAL CARE IF:  °· You have a fever. °· You develop redness and swelling of your face, jaw, or neck. °· You are unable to open your mouth. °· You have severe pain uncontrolled by pain medicine. °MAKE SURE YOU:  °· Understand these instructions. °· Will watch your condition. °· Will get help right away if  you are not doing well or get worse. °Document Released: 09/11/2005 Document Revised: 12/04/2011 Document Reviewed: 04/29/2008 °ExitCare® Patient Information ©2015 [employer]. This information is not intended to replace advice given to you by your health care provider. Make sure you discuss any questions you have with your health care provider. ° °Emergency Department Resource Guide °1) Find a Doctor and Pay Out of Pocket °Although you won't have to find out who is covered by your insurance plan, it is a good idea to ask around and get recommendations. You will then need to call the office and see if the doctor you have chosen will accept you as a new patient and what types of options they offer for patients who are self-pay. Some doctors offer discounts or will set up payment plans for their patients who do not have insurance, but you will need to ask so you aren't surprised when you get to your appointment. ° °2) Contact Your Local Health Department °Not all health departments have doctors that can see patients for sick visits, but many do, so it is worth a call to see   if yours does. If you don't know where your local health department is, you can check in your phone book. The CDC also has a tool to help you locate your state's health department, and many state websites also have listings of all of their local health departments. ° °3) Find a Walk-in Clinic °If your illness is not likely to be very severe or complicated, you may want to try a walk in clinic. These are popping up all over the country in pharmacies, drugstores, and shopping centers. They're usually staffed by nurse practitioners or physician assistants that have been trained to treat common illnesses and complaints. They're usually fairly quick and inexpensive. However, if you have serious medical issues or chronic medical problems, these are probably not your best option. ° °No Primary Care Doctor: °- Call [facility] at  832-8000 - they can  help you locate a primary care doctor that  accepts your insurance, provides certain services, etc. °- Physician Referral Service- 1-800-533-3463 ° °Chronic Pain Problems: °Organization         Address  Phone   Notes  °[facility]  ([phone]) [phone] Patients need to be referred by their primary care doctor.  ° °Medication Assistance: °Organization         Address  Phone   Notes  °[place] [facility] [address] °[place], [place] [address] (336) 641-8030 --Must be a resident of [place] °-- Must have NO insurance coverage whatsoever (no Medicaid/ Medicare, etc.) °-- The pt. MUST have a primary care doctor that directs their care regularly and follows them in the community °  °[facility]  (866) 331-1348   °[employer]  (888) 892-1162   ° °Agencies that provide inexpensive medical care: °Organization         Address  Phone   Notes  °[facility]  ([phone]) 832-8035   °[facility]    (336) 832-7272   °[facility] [address] °[place], [place] [address] (336) 832-4777   °[facility] [address], °[place] (336) 271-4999   °[facility]    (336) 373-0678   °[facility]    (336) 272-1050   °[facility] ° [address], [place] Phone:  (336) 832-4444, Fax:  (336) 832-4440 Hours of Operation:  9 am - 6 pm, M-F.  Also accepts Medicaid/Medicare and self-pay.  °[facility] ° [address], [place] Phone: (336) [phone], Fax: (336) [phone]. Hours of Operation:  8:30 am - 5:30 pm, M-F.  Also accepts Medicaid and self-pay.  °[facility] [place] [address], [place] Phone: (336) [phone]   °[facility] [address], [place],  (336)723-1848, Ext. 123 Mondays & Thursdays: 7-9 AM.  First 15 patients are seen on a first come, first serve basis. °  ° °Medicaid-accepting [place] Providers: ° °Organization         Address  Phone   Notes  °[facility] [address], [place] ([phone]) [phone] Also accepts self-pay patients.  °[facility] [address], [place] ° (336) [phone]   °[facility] [address], [place] (336) 288-8857   °[facility] [address] [place] [address], [place] (336) [phone]   °[facility] [address], [place]  ° (  [phone]) [phone] Only accepts [place] Access Medicaid patients after they have their name applied to their card.  ° °Self-Pay (no insurance) in [place]: ° °Organization         Address  Phone   Notes  °Sickle Cell Patients, [facility] [address], [place] (336) [phone]   °[facility] [address], [place] (336) [phone]   °[facility] ° [address],  (336) [phone]   °[facility]/Dr. Osei-Bonsu ° [address] [place] [address], [place] or [address], [place] ([phone]) [phone] Phone number for both [place] and [place] locations is the same.  °[facility] [address], [place] (336) [phone]   °[facility] [address] [place] [address], [place] or [address] (336) 852-7530 °(336) 878-2260   °[facility] [address], [place] (336) 350-1642, phone; (336) 294-5005, fax Sees patients 1st and 3rd Saturday of every month.  Must not qualify for public or private insurance (i.e. Medicaid, Medicare, [place] Health Choice, Veterans' Benefits) • Household income should be no more than 200% of the poverty level •The clinic cannot treat you if you are pregnant or think you are pregnant • Sexually transmitted diseases are not treated at the clinic.  ° ° °Dental Care: °Organization         Address  Phone  Notes  °[place] [facility]  [facility] [address], [place] (336) 641-6152 Accepts children up to age 21 who are enrolled in Medicaid or [place] Health Choice; pregnant women with a Medicaid card; and children who have applied for Medicaid or [place] Health Choice, but were declined, whose parents can pay a reduced fee at time of service.  °[facility] [place]  [address], [place] (336) 641-7733 Accepts children up to age 21 who are enrolled in Medicaid or [place] Health Choice; pregnant women with a Medicaid card; and children who have applied for Medicaid or [place] Health Choice, but were declined, whose parents can pay a reduced fee at time of service.  °[facility] ° [address], [place] (336) 641-4533 Patients are seen by appointment only. Walk-ins are not accepted. [facility] will see patients 18 years of age and older. °Monday - Tuesday (8am-5pm) °Most Wednesdays (8:30-5pm) °$30 per visit, cash only  °[facility] ° [address], [place] ([phone]) [phone] Patients are seen by appointment only. Walk-ins are not accepted. [facility] will see patients 18 years of age and older. °One Wednesday Evening (Monthly: Volunteer Based).  $30 per visit, cash only  °[facility]  ([phone]) [phone] for adults; Children under age 4, call Graduate Pediatric Dentistry at ([phone]) [phone]. Children aged 4-14, please call ([phone]) [phone] to request a pediatric application. ° Dental services are provided in all areas of dental care including fillings, crowns and bridges, complete and partial dentures, implants, gum treatment, root canals, and extractions. Preventive care is also provided. Treatment is provided to both adults and children. °Patients are selected via a lottery and there is often a waiting list. °  °[facility] [address], °[place] ° (336) 763-8833 [email or web address] °  °[facility] [address], [place], [place] (336)723-1848, Ext. 123 Second and Fourth Thursday of each month, opens at 6:30   AM; Clinic ends at 9 AM.  Patients are seen on a first-come first-served basis, and a limited number are seen during each clinic.  ° °[facility] ° [address], [place], [place] (336) [phone]   Eligibility Requirements °You must have lived in [place], [place], or [place] counties for at least the last three months. °  You cannot be eligible for state or federal sponsored healthcare insurance, including Veterans Administration, Medicaid, or Medicare. °  You generally cannot be eligible for healthcare insurance through your employer.  °  How to apply: °Eligibility screenings are held every Tuesday and Wednesday afternoon from 1:00 pm until 4:00 pm. You do not need an appointment for the interview!  °[facility] [address], [place],  [phone]   °[facility]  336-342-8273   °[facility]  [phone]   °[facility]  [phone]   ° °

## 2014-11-04 ENCOUNTER — Encounter (HOSPITAL_COMMUNITY): Payer: Self-pay | Admitting: Emergency Medicine

## 2014-11-04 ENCOUNTER — Emergency Department (HOSPITAL_COMMUNITY)
Admission: EM | Admit: 2014-11-04 | Discharge: 2014-11-04 | Disposition: A | Discharge status: Home / Self Care | Payer: Self-pay | Attending: Emergency Medicine | Admitting: Emergency Medicine

## 2014-11-04 DIAGNOSIS — Z720 Tobacco use: Secondary | ICD-10-CM | POA: Insufficient documentation

## 2014-11-04 DIAGNOSIS — K088 Other specified disorders of teeth and supporting structures: Secondary | ICD-10-CM | POA: Insufficient documentation

## 2014-11-04 DIAGNOSIS — J029 Acute pharyngitis, unspecified: Secondary | ICD-10-CM | POA: Insufficient documentation

## 2014-11-04 DIAGNOSIS — K0889 Other specified disorders of teeth and supporting structures: Secondary | ICD-10-CM

## 2014-11-04 DIAGNOSIS — H9203 Otalgia, bilateral: Secondary | ICD-10-CM | POA: Insufficient documentation

## 2014-11-04 MED ORDER — PENICILLIN V POTASSIUM 500 MG PO TABS: 500.0000 mg | ORAL_TABLET | Freq: Four times a day (QID) | ORAL | Status: AC

## 2014-11-04 MED ORDER — HYDROCODONE-ACETAMINOPHEN 5-325 MG PO TABS: 1.0000 | ORAL_TABLET | ORAL | Status: DC | PRN

## 2014-11-04 NOTE — ED Provider Notes (Signed)
CSN: [id number]     Arrival date & time 11/04/14  1728 History  This chart was scribed for non-physician practitioner Oswaldo Conroy , PA-C working with Richardean Canal, MD by Conchita Paris, ED Scribe. This patient was seen in WTR6/WTR6 and the patient's care was started at 6:14 PM.    Chief Complaint  Patient presents with  . Dental Pain   Patient is a 29 y.o. male presenting with tooth pain. The history is provided by the patient. No language interpreter was used.  Dental Pain   HPI Comments: Duane Price is a 29 y.o. male who presents to the Emergency Department complaining of acute, constant, right sided dental pain over the past week. The pain is described as sore and aching. The pain radiates to the back of his jaw and ears. He has trouble swallowing and a sore throat as associated symptoms. Eating sweet foods, or drinking soda worsens the pain. The pain is worst at night. He has taken Advil PM, ibuprofen, tylenol and "goody powders" for relief. He stopped taking the Advil because it kept him up at night. Pt had a back molar tooth removed and the dentist warned him that he would most likely develop problems with this tooth. Pt was seen here on 08/04/2014 for a similar complaint and treated with penicillin and pain medicine. 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 No fever, chills, nausea, vomiting, diarrhea CP, SOB, drooling, inability to close his mouth. No history of diabetes.  History reviewed. No pertinent past medical history. Past Surgical History  Procedure Laterality Date  . Facial surgery     No family history on file. History  Substance Use Topics  . Smoking status: Current Every Day Smoker -- 0.50 packs/day  . Smokeless tobacco: Not on file  . Alcohol Use: Yes    Review of Systems  Constitutional: Negative for chills and fatigue.  HENT: Positive for dental problem, ear pain, sore throat and trouble swallowing.   Respiratory: Negative for cough and shortness of breath.   Cardiovascular:  Negative for chest pain and palpitations.  Gastrointestinal: Negative for nausea, vomiting and diarrhea.    Allergies  Review of patient's allergies indicates no known allergies.  Home Medications   Prior to Admission medications   Medication Sig Start Date End Date Taking? Authorizing Provider  HYDROcodone-acetaminophen (NORCO/VICODIN) 5-325 MG per tablet Take 1-2 tablets by mouth every 4 (four) hours as needed. 11/04/14   Louann Sjogren, PA-C  penicillin v potassium (VEETID) 500 MG tablet Take 1 tablet (500 mg total) by mouth 4 (four) times daily. 11/04/14 11/11/14  Benetta Spar L Teresita Fanton, PA-C   BP 129/77 mmHg  Pulse 100  Temp(Src) 97.9 F (36.6 C) (Oral)  Resp 16  SpO2 100% Physical Exam  Constitutional: He appears well-developed and well-nourished. No distress.  HENT:  Head: Normocephalic and atraumatic.  Mouth/Throat: Oropharynx is clear and moist. No oropharyngeal exudate.  No trismus or uvula deviation No lip, tongue, facial swelling. No swelling under tongue or tenderness. Patient handling secretions. No drooling. Patient with poor dentition. Patient with tenderness to  right lower back molar with mild erythema in this gingiva but no drainable abscess.   Eyes: Conjunctivae are normal. Right eye exhibits no discharge. Left eye exhibits no discharge.  Neck: Normal range of motion. Neck supple.  No neck masses or tenderness.  Cardiovascular: Normal rate and regular rhythm.   Pulmonary/Chest: Effort normal and breath sounds normal. No respiratory distress. He has no wheezes.  Abdominal: Soft. He exhibits no  distension. There is no tenderness.  Lymphadenopathy:    He has no cervical adenopathy.  Neurological: He is alert. Coordination normal.  Skin: Skin is warm and dry. He is not diaphoretic.  Nursing note and vitals reviewed.   ED Course  Procedures  DIAGNOSTIC STUDIES: Oxygen Saturation is 100% on room air, normal by my interpretation.    COORDINATION OF CARE: 6:22  PM Discussed treatment plan with pt at bedside and pt agreed to plan.  Labs Review Labs Reviewed - No data to display  Imaging Review No results found.   EKG Interpretation None      MDM   Final diagnoses:  Pain, dental   Patient with toothache.  No gross abscess.  No history of diabetes. Exam unconcerning for Ludwig's angina or spread of infection.  Will treat with penicillin and pain medicine.  Driving and sedation precautions provided. Urged patient to follow-up with dentist.  ED resources provided.  Discussed return precautions with patient. Discussed all results and patient verbalizes understanding and agrees with plan.  I personally performed the services described in this documentation, which was scribed in my presence. The recorded information has been reviewed and is accurate.   Louann SjogrenVictoria L Sheikh Leverich, PA-C 11/04/14 1842  [name], MD 11/05/14 Jorje Guild0005

## 2014-11-04 NOTE — ED Notes (Signed)
Pt c/o rt sided dental pain x 1 month.

## 2014-11-04 NOTE — Discharge Instructions (Signed)
Return to the emergency room with worsening of symptoms, new symptoms or with symptoms that are concerning, especially fevers, facial swelling, unable to open mouth, difficulty breathing or swallowing, drooling. Use the below resources to establish care with the dentist as soon as possible. Norco for severe pain. Do not drink Drive operative machinery or take any other sedating meds while taking narcotics. Please take all of your antibiotics until finished!   You may develop abdominal discomfort or diarrhea from the antibiotic.  You may help offset this with probiotics which you can buy or get in yogurt. Do not eat  or take the probiotics until 2 hours after your antibiotic.  Read below information and follow recommendations.   Dental Pain A tooth ache may be caused by cavities (tooth decay). Cavities expose the nerve of the tooth to air and hot or cold temperatures. It may come from an infection or abscess (also called a boil or furuncle) around your tooth. It is also often caused by dental caries (tooth decay). This causes the pain you are having. DIAGNOSIS  Your caregiver can diagnose this problem by exam. TREATMENT   If caused by an infection, it may be treated with medications which kill germs (antibiotics) and pain medications as prescribed by your caregiver. Take medications as directed.  Only take over-the-counter or prescription medicines for pain, discomfort, or fever as directed by your caregiver.  Whether the tooth ache today is caused by infection or dental disease, you should see your dentist as soon as possible for further care. SEEK MEDICAL CARE IF: The exam and treatment you received today has been provided on an emergency basis only. This is not a substitute for complete medical or dental care. If your problem worsens or new problems (symptoms) appear, and you are unable to meet with your dentist, call or return to this location. SEEK IMMEDIATE MEDICAL CARE IF:   You have a  fever.  You develop redness and swelling of your face, jaw, or neck.  You are unable to open your mouth.  You have severe pain uncontrolled by pain medicine. MAKE SURE YOU:   Understand these instructions.  Will watch your condition.  Will get help right away if you are not doing well or get worse. Document Released: 09/11/2005 Document Revised: 12/04/2011 Document Reviewed: 04/29/2008 [facility] Patient Information 2015 [place], [place]. This information is not intended to replace advice given to you by your health care provider. Make sure you discuss any questions you have with your health care provider.   Emergency Department Resource Guide 1) Find a Doctor and Pay Out of Pocket Although you won't have to find out who is covered by your insurance plan, it is a good idea to ask around and get recommendations. You will then need to call the office and see if the doctor you have chosen will accept you as a new patient and what types of options they offer for patients who are self-pay. Some doctors offer discounts or will set up payment plans for their patients who do not have insurance, but you will need to ask so you aren't surprised when you get to your appointment.  2) Contact Your Local Health Department Not all health departments have doctors that can see patients for sick visits, but many do, so it is worth a call to see if yours does. If you don't know where your local health department is, you can check in your phone book. The CDC also has a tool to help you  locate your state's health department, and many state websites also have listings of all of their local health departments.  3) Find a Walk-in Clinic If your illness is not likely to be very severe or complicated, you may want to try a walk in clinic. These are popping up all over the country in pharmacies, drugstores, and shopping centers. They're usually staffed by nurse practitioners or physician assistants that have been  trained to treat common illnesses and complaints. They're usually fairly quick and inexpensive. However, if you have serious medical issues or chronic medical problems, these are probably not your best option.  No Primary Care Doctor: - Call Health Connect at  [phone] - they can help you locate a primary care doctor that  accepts your insurance, provides certain services, etc. - Physician Referral Service- [phone]  Chronic Pain Problems: Organization         Address  Phone   Notes  [facility]  [phone] Patients need to be referred by their primary care doctor.   Medication Assistance: Organization         Address  Phone   Notes  [facility] [address] [place], [place] [address] [phone] --Must be a resident of [facility] -- Must have NO insurance coverage whatsoever (no Medicaid/ Medicare, etc.) -- The pt. MUST have a primary care doctor that directs their care regularly and follows them in the community   [facility]  [phone]   [employer]  [phone]    Agencies that provide inexpensive medical care: Organization         Address  Phone   Notes  [facility]  [phone]   [facility]    [phone]   [facility] [address] [place], [place] [address] [phone]   [facility] [address] [place] [place] [phone]   Planned Parenthood    [phone]   [facility]    [phone]   [facility]  [address], [place] Phone:  [phone], Fax:  [phone] Hours of Operation:  9 am - 6 pm, M-F.  Also accepts Medicaid/Medicare and self-pay.  [facility]  [address], [place] Phone: [phone], Fax: [phone]. Hours of Operation:  8:30 am - 5:30 pm, M-F.  Also accepts Medicaid and self-pay.   [facility] [place] [address] [place] Phone: [phone]   [facility] [address] Natasha Bence [place], [place] [phone], Ext. [phone] Mondays & Thursdays: 7-9 AM.  First 15 patients are seen on a first come, first serve basis.    Medicaid-accepting [facility] Providers:  Organization         Address  Phone   Notes  [facility] [address], [place] [phone] Also accepts self-pay patients.  [facility] [address] Laurell Josephs [place], [place]  [phone]   [facility] [address], [place] [phone]   [facility] [address], [place] [phone]   Renaye Rakers [address], [place]   [phone] Only accepts [place] Access [place] patients after they have their name applied to their card.   Self-Pay (no insurance) in [facility]:  Organization  Address  Phone   Notes  Sickle Cell Patients, [facility] [address], [place] [phone]) [phone]   [facility] [address], [place] [phone]   [facility] [place]  [address], [place] [phone]   [facility]/Dr. Osei-Bonsu  [address] [place] [address], [place] or [phone], [place] [phone] Phone number for both [place] and [place] locations is the same.  [facility] [address], [place] [phone]) [phone]   [facility] [place] [address] [place] [address], [place] or [address] [phone]) [phone]   [facility] [address], [place] [phone], phone; [phone](336) [phone], fax Sees patients 1st and 3rd Saturday of every month.  Must not qualify for public or private insurance (i.e. Medicaid, Medicare, [place] Health Choice, Veterans' Benefits)  Household income should be no  more than 200% of the poverty level The clinic cannot treat you if you are pregnant or think you are pregnant  Sexually transmitted diseases are not treated at the clinic.    Dental Care: Organization         Address  Phone  Notes  [facility] : [facility] [address], [place] [phone] Accepts children up to age 27 who are enrolled in [place] or [place] Health Choice; pregnant women with a Medicaid card; and children who have applied for Medicaid or [place] Health Choice, but were declined, whose parents can pay a reduced fee at time of service.  [facility] [place]  [address], [place] [phone] Accepts children up to age 27 who are enrolled in [place] or [place] Health Choice; pregnant women with a Medicaid card; and children who have applied for Medicaid or [place] Health Choice, but were declined, whose parents can pay a reduced fee at time of service.  [facility]  [address], [place] [phone]) [phone] Patients are seen by appointment only. Walk-ins are not accepted. [facility] will see patients 27 years of age and older. Monday - Tuesday (8am-5pm) Most Wednesdays (8:30-5pm) $30 per visit, cash only  [facility]  [address], [facility] [phone] Patients are seen by appointment only. Walk-ins are not accepted. [facility] will see patients 27 years of age and older. One Wednesday Evening (Monthly: Volunteer Based).  $30 per visit, cash only  [facility]  [phone]) [phone] for adults; Children under age 684, call Graduate Pediatric Dentistry at [phone]. Children aged 314-14, please call [phone]) [phone] to request a pediatric application.  Dental services are provided in all areas of dental care including fillings, crowns and bridges, complete and partial dentures, implants, gum treatment, root canals,  and extractions. Preventive care is also provided. Treatment is provided to both adults and children. Patients are selected via a lottery and there is often a waiting list.   [facility] [address], [place]  [phone]) [phone] [email or web address]   [facility] [address], [place], [place] [phone], Ext. [phone] Second and Fourth Thursday of each month, opens at 6:30 AM; Clinic ends at 9 AM.  Patients are seen on a first-come first-served basis, and a limited number are seen during each clinic.   [facility]  [address], [place]  [place], [place] [phone]   Eligibility Requirements You must have lived in [place], [place], or [place] counties for at least the last three months.   You cannot be eligible for state or federal sponsored [place], including [employer], [place], or [employer].   You generally cannot be eligible for healthcare insurance through your employer.    How to apply: Eligibility screenings are held every Tuesday and Wednesday afternoon from 1:00 pm until 4:00 pm. You do not need an appointment for the interview!  [facility] [address] [place], [place] [phone]   [facility] Health Department  [phone]   [facility] Health Department  [phone]   [facility] Health Department  [phone]    Behavioral Health Resources in the Community: Intensive Outpatient Programs Organization         Address  Phone  Notes  [facility] Services [address], [place], [place] [phone]   [facility] Outpatient [address], [place], [place] [phone]   [facility] [address] [place], [place]  [phone]   [facility] Mental Health [address]  [place], [place] [phone] or [phone]   Substance Abuse Resources Organization         Address  Phone  Notes  [facility] Drug [facility]  [phone]    [facility]  [phone]   [facility]  [phone]   Floydene Flock  [phone]   Residential & Outpatient Substance Abuse Program  [phone]   Psychological Services Organization         Address  Phone  Notes  [facility] Behavioral Health  [phone]   [facility] Services  [phone]   [facility] Mental Health [address], [place] [phone] or [phone]    Mobile Crisis Teams Organization         Address  Phone  Notes  [facility], Mobile Crisis Care Unit  [phone]   [facility]  [address] [place], [place] [phone]   Doristine Locks [address] [place] [phone]    Self-Help/Support Groups Organization         Address  Phone             Notes  [facility] [place] - variety of support groups  [phone]- I7437963 Call for more information  Narcotics Anonymous (NA), [facility] [address], [employer] [place]  2 meetings at this location   Statistician         Address  Phone  Notes  [facility] [address],    [place]  [phone]   [facility]  [address], [place] [address], [place], [place] [phone]   [facility] [address] [place], [place] [phone] Admissions: 8am-3pm M-F  [facility] [address]    [place], [place] [phone]   [facility] [address] [place], [place], [place] [phone]   [facility] [address],  [place], [place] [phone]   [facility] - Intensive Outpatient [address] Laurell Josephs [address], [place], [place] [phone]   [facility] ([facility] [address]  [place], [place] [phone] or [phone]   [facility] ([facility]) [address], [place], [place] [phone] Accepts Medicaid  [facility] [place] [address],    [place] [phone] Substance Abuse/Addiction Treatment   [facility] Resources  Organization         Address  Phone  Notes  [facility]  [phone]   Domenic Schwab, PhD [address] Arlis Porta [place], [place]   [phone] or [phone]   [facility] [place], [place] [phone]   [address], [place], [place] [phone] Insurance/Medicaid/sponsorship through [facility] and Families [address]                                    [place], [place] [phone] [place] [address], [place] [phone]    Dr. Adele Schilder  [phone]   [facility] Dept. 1) [address] [place] 2) [place] 3)  [place] [address], [place] [phone]  [phone]   [place] [phone] or [phone] (After Hours)

## 2014-12-01 ENCOUNTER — Encounter (HOSPITAL_COMMUNITY): Payer: Self-pay | Admitting: *Deleted

## 2014-12-01 ENCOUNTER — Emergency Department (HOSPITAL_COMMUNITY)
Admission: EM | Admit: 2014-12-01 | Discharge: 2014-12-01 | Disposition: A | Discharge status: Home / Self Care | Payer: Self-pay | Attending: Emergency Medicine | Admitting: Emergency Medicine

## 2014-12-01 DIAGNOSIS — K029 Dental caries, unspecified: Secondary | ICD-10-CM | POA: Insufficient documentation

## 2014-12-01 DIAGNOSIS — Z720 Tobacco use: Secondary | ICD-10-CM | POA: Insufficient documentation

## 2014-12-01 MED ORDER — TRAMADOL HCL 50 MG PO TABS: 50.0000 mg | ORAL_TABLET | Freq: Four times a day (QID) | ORAL | Status: DC | PRN

## 2014-12-01 NOTE — ED Notes (Signed)
Walked into pt's room, he was not there.

## 2014-12-01 NOTE — ED Notes (Signed)
Pt reports R lower dental pain that he's had for a while now.  Pt reports being seen here x 3 weeks ago for same but lost the referral to a dentist and needs another one.

## 2014-12-01 NOTE — Discharge Instructions (Signed)
Dental Care and Dentist Visits °Dental care supports good overall health. Regular dental visits can also help you avoid dental pain, bleeding, infection, and other more serious health problems in the future. It is important to keep the mouth healthy because diseases in the teeth, gums, and other oral tissues can spread to other areas of the body. Some problems, such as diabetes, heart disease, and pre-term labor have been associated with poor oral health.  °See your dentist every 6 months. If you experience emergency problems such as a toothache or broken tooth, go to the dentist right away. If you see your dentist regularly, you may catch problems early. It is easier to be treated for problems in the early stages.  °WHAT TO EXPECT AT A DENTIST VISIT  °Your dentist will look for many common oral health problems and recommend proper treatment. At your regular dental visit, you can expect: °· Gentle cleaning of the teeth and gums. This includes scraping and polishing. This helps to remove the sticky substance around the teeth and gums (plaque). Plaque forms in the mouth shortly after eating. Over time, plaque hardens on the teeth as tartar. If tartar is not removed regularly, it can cause problems. Cleaning also helps remove stains. °· Periodic X-rays. These pictures of the teeth and supporting bone will help your dentist assess the health of your teeth. °· Periodic fluoride treatments. Fluoride is a natural mineral shown to help strengthen teeth. Fluoride treatment involves applying a fluoride gel or varnish to the teeth. It is most commonly done in children. °· Examination of the mouth, tongue, jaws, teeth, and gums to look for any oral health problems, such as: °· Cavities (dental caries). This is decay on the tooth caused by plaque, sugar, and acid in the mouth. It is best to catch a cavity when it is small. °· Inflammation of the gums caused by plaque buildup (gingivitis). °· Problems with the mouth or malformed  or misaligned teeth. °· Oral cancer or other diseases of the soft tissues or jaws.  °KEEP YOUR TEETH AND GUMS HEALTHY °For healthy teeth and gums, follow these general guidelines as well as your dentist's specific advice: °· Have your teeth professionally cleaned at the dentist every 6 months. °· Brush twice daily with a fluoride toothpaste. °· Floss your teeth daily.  °· Ask your dentist if you need fluoride supplements, treatments, or fluoride toothpaste. °· Eat a healthy diet. Reduce foods and drinks with added sugar. °· Avoid smoking. °TREATMENT FOR ORAL HEALTH PROBLEMS °If you have oral health problems, treatment varies depending on the conditions present in your teeth and gums. °· Your caregiver will most likely recommend good oral hygiene at each visit. °· For cavities, gingivitis, or other oral health disease, your caregiver will perform a procedure to treat the problem. This is typically done at a separate appointment. Sometimes your caregiver will refer you to another dental specialist for specific tooth problems or for surgery. °SEEK IMMEDIATE DENTAL CARE IF: °· You have pain, bleeding, or soreness in the gum, tooth, jaw, or mouth area. °· A permanent tooth becomes loose or separated from the gum socket. °· You experience a blow or injury to the mouth or jaw area. °Document Released: 05/24/2011 Document Revised: 12/04/2011 Document Reviewed: 05/24/2011 °ExitCare® Patient Information ©2015 [employer]. This information is not intended to replace advice given to you by your health care provider. Make sure you discuss any questions you have with your health care provider. °Dental Pain °A tooth ache may be caused by   cavities (tooth decay). Cavities expose the nerve of the tooth to air and hot or cold temperatures. It may come from an infection or abscess (also called a boil or furuncle) around your tooth. It is also often caused by dental caries (tooth decay). This causes the pain you are having. °DIAGNOSIS   °Your caregiver can diagnose this problem by exam. °TREATMENT  °· If caused by an infection, it may be treated with medications which kill germs (antibiotics) and pain medications as prescribed by your caregiver. Take medications as directed. °· Only take over-the-counter or prescription medicines for pain, discomfort, or fever as directed by your caregiver. °· Whether the tooth ache today is caused by infection or dental disease, you should see your dentist as soon as possible for further care. °SEEK MEDICAL CARE IF: °The exam and treatment you received today has been provided on an emergency basis only. This is not a substitute for complete medical or dental care. If your problem worsens or new problems (symptoms) appear, and you are unable to meet with your dentist, call or return to this location. °SEEK IMMEDIATE MEDICAL CARE IF:  °· You have a fever. °· You develop redness and swelling of your face, jaw, or neck. °· You are unable to open your mouth. °· You have severe pain uncontrolled by pain medicine. °MAKE SURE YOU:  °· Understand these instructions. °· Will watch your condition. °· Will get help right away if you are not doing well or get worse. °Document Released: 09/11/2005 Document Revised: 12/04/2011 Document Reviewed: 04/29/2008 °ExitCare® Patient Information ©2015 [employer]. This information is not intended to replace advice given to you by your health care provider. Make sure you discuss any questions you have with your health care provider. ° °Emergency Department Resource Guide °1) Find a Doctor and Pay Out of Pocket °Although you won't have to find out who is covered by your insurance plan, it is a good idea to ask around and get recommendations. You will then need to call the office and see if the doctor you have chosen will accept you as a new patient and what types of options they offer for patients who are self-pay. Some doctors offer discounts or will set up payment plans for their  patients who do not have insurance, but you will need to ask so you aren't surprised when you get to your appointment. ° °2) Contact Your Local Health Department °Not all health departments have doctors that can see patients for sick visits, but many do, so it is worth a call to see if yours does. If you don't know where your local health department is, you can check in your phone book. The CDC also has a tool to help you locate your state's health department, and many state websites also have listings of all of their local health departments. ° °3) Find a Walk-in Clinic °If your illness is not likely to be very severe or complicated, you may want to try a walk in clinic. These are popping up all over the country in pharmacies, drugstores, and shopping centers. They're usually staffed by nurse practitioners or physician assistants that have been trained to treat common illnesses and complaints. They're usually fairly quick and inexpensive. However, if you have serious medical issues or chronic medical problems, these are probably not your best option. ° °No Primary Care Doctor: °- Call [facility] at  832-8000 - they can help you locate a primary care doctor that  accepts your insurance, provides   certain services, etc. °- Physician Referral Service- 1-800-533-3463 ° °Chronic Pain Problems: °Organization         Address  Phone   Notes  °[facility]  (336) 297-2271 Patients need to be referred by their primary care doctor.  ° °Medication Assistance: °Organization         Address  Phone   Notes  °[place] [facility] [address] °[place], [place] [address] (336) 641-8030 --Must be a resident of [place] °-- Must have NO insurance coverage whatsoever (no Medicaid/ Medicare, etc.) °-- The pt. MUST have a primary care doctor that directs their care regularly and follows them in the community °  °[facility]  (866) 331-1348   °[employer]  (888) 892-1162    ° °Agencies that provide inexpensive medical care: °Organization         Address  Phone   Notes  °[facility]  (336) 832-8035   °[facility]    (336) 832-7272   °[facility] [address] °[place], [place] [address] (336) 832-4777   °[facility] [address], °[place] (336) 271-4999   °[facility]    (336) 373-0678   °[facility]    (336) 272-1050   °[facility] ° [address], [place] Phone:  (336) 832-4444, Fax:  (336) 832-4440 Hours of Operation:  9 am - 6 pm, M-F.  Also accepts Medicaid/Medicare and self-pay.  °[facility] ° [address], [place] Phone: (336) 832-3150, Fax: (336) 832-3151. Hours of Operation:  8:30 am - 5:30 pm, M-F.  Also accepts Medicaid and self-pay.  °[facility] [place] [address], [place] Phone: (336) 878-6027   °[facility] [address], [place], [place] (336)723-1848, Ext. 123 Mondays & Thursdays: 7-9 AM.  First 15 patients are seen on a first come, first serve basis. °  ° °Medicaid-accepting [place] Providers: ° °Organization         Address  Phone   Notes  °[facility] [address], [place] (336) 641-2100 Also accepts self-pay patients.  °[facility] [address], [place] ° (336) 856-9996   °[facility] [address], [place] (336) 288-8857   °[facility] [address] [place] [address], [place] (336) 299-7000   °Veita Bland [address], [place]  ° (336) 373-1557 Only accepts [place] Access Medicaid patients after they have their name applied to their card.  ° °Self-Pay (no insurance) in [place]: ° °Organization         Address  Phone   Notes  °Sickle Cell Patients, [facility] [address], [place] (336) 832-1970    °[facility] [address], [place] (336) 832-4400   °[facility] ° [address], [place] (336) 992-4800   °[facility]/Dr. Osei-Bonsu ° [address] [place] [address], [place] or [address], [place] (336) 841-8500 Phone number for both [place] and [place] locations is the same.  °[facility] [address], [place] (336) 299-0000   °[facility] [address] [place] [address], [place] or [address] (336) 852-7530 °(336) 878-2260   °[facility] [address], [place] (336) 350-1642, phone; (336) 294-5005, fax Sees patients 1st and 3rd Saturday of every month.  Must not qualify for public or private insurance (i.e. Medicaid, Medicare, [place] Health Choice, Veterans' Benefits) • Household income should be no more than 200% of the poverty level •The clinic cannot treat you if you are pregnant or think you are pregnant • Sexually transmitted diseases are not treated at the clinic.  ° ° °Dental Care: °Organization         Address  Phone  Notes  °[facility] [facility] [address], [place] (336) 641-6152 Accepts children up to age 21 who are enrolled in Medicaid or [place] Health Choice; pregnant women with a Medicaid card; and children who have applied for Medicaid or [place] Health Choice, but were declined, whose parents can pay a reduced fee at time of service.  °[facility] [place]  [address], [place] (336) 641-7733 Accepts children up to age 21 who are enrolled in Medicaid or [place] Health Choice; pregnant women with a Medicaid card; and children who have applied for Medicaid or [place] Health Choice, but were declined, whose parents can pay a reduced fee at time of service.  °[facility] ° [address], [place] (336) 641-4533 Patients are seen by  appointment only. Walk-ins are not accepted. [facility] will see patients 18 years of age and older. °Monday - Tuesday (8am-5pm) °Most Wednesdays (8:30-5pm) °$30 per visit, cash only  °[facility] ° [address], [place] (336) 641-4533 Patients are seen by appointment only. Walk-ins are not accepted. [facility] will see patients 18 years of age and older. °One Wednesday Evening (Monthly: Volunteer Based).  $30 per visit, cash only  °[facility]  (919) 537-3737 for adults; Children under age 4, call Graduate Pediatric Dentistry at (919) [phone]. Children aged 4-14, please call (919) [phone] to request a pediatric application. ° Dental services are provided in all areas of dental care including fillings, crowns and bridges, complete and partial dentures, implants, gum treatment, root canals, and extractions. Preventive care is also provided. Treatment is provided to both adults and children. °Patients are selected via a lottery and there is often a waiting list. °  °[facility] [address], °[place] ° (336) 763-8833 [email or web address] °  °[facility] [address], [place], [place] (336)723-1848, Ext. 123 Second and Fourth Thursday of each month, opens at 6:30 AM; Clinic ends at 9 AM.  Patients are seen on a first-come first-served basis, and a limited number are seen during each clinic.  ° °[facility] ° [address], [place], [place] (336) 723-7904   Eligibility Requirements °You must have lived in [place], [place], or [place] counties for at least the last three months. °  You cannot be eligible for state or federal sponsored healthcare insurance, including Veterans Administration, Medicaid, or Medicare. °  You generally cannot be eligible for healthcare insurance through your employer.  °  How to apply: °Eligibility screenings are held every Tuesday and Wednesday afternoon from 1:00 pm until 4:00 pm. You  do not need an appointment for the interview!  °[facility] [address], [place], [place] 336-631-2330   °[facility]  336-342-8273   °[facility]  336-703-3100   °[facility]  336-570-6415   ° °  Behavioral Health Resources in the Community: °Intensive Outpatient Programs °Organization         Address  Phone  Notes  °[facility] [address], [place], [place] 336-878-6098   °[facility] [address], [place], [place] 336-832-9800   °[facility] [address], [place], [place] ° 336-882-2125   °[facility] [address],  °[place], [place] 1-800-853-5163 or 336-641-4981   °Substance Abuse Resources °Organization         Address  Phone  Notes  °[facility]  336-882-2125   °[facility]  336-784-9470   °[facility]  336-285-9073   °[facility]  336-845-3988   °Residential & Outpatient Substance Abuse Program  1-800-659-3381   °Psychological Services °Organization         Address  Phone  Notes  °[facility]  336- 832-9600   °[facility]  336- 378-7881   °[facility] [address], [place] 1-800-853-5163 or 336-641-4981   ° °Mobile Crisis Teams °Organization         Address  Phone  Notes  °[facility], Mobile Crisis Care Unit  1-877-626-1772   °Assertive °[facility] ° [address] [place], [place] 336-834-9664   °Sharon DeEsch [address] °[place] 336-554-5454   ° °Self-Help/Support Groups °Organization         Address  Phone             Notes  °[facility] [place] [facility] - variety of support groups  336- 373-1402 Call for more information  °Narcotics Anonymous (NA), [facility] [address], °[place]  2 meetings at this location  ° °Residential Treatment Programs °Organization          Address  Phone  Notes  °[facility] [address],    °[place]  1-866-801-8205   °[facility] ° [address], [place], [place] 704-293-8524   °[facility] [address], [place] 336-845-3988 Admissions: 8am-3pm M-F  °[facility] [address]    °[place], [place] 336-841-1104   °[facility] [address], [place], [place] 336-379-7146   °[facility] [address]  °[place], [place] 336-285-9073   °[facility] - Intensive Outpatient [address], [place], [place] 336-852-3033   °[facility] [address]  °[place], [place] 1-877-615-2722 or 336-784-9470   °[facility] [address] [place], [place] 336-227-7417 Accepts Medicaid  °[facility] [address]  °[place] 1-800-659-3381 Substance Abuse/Addiction Treatment  ° °[place] Behavioral Health Resources °Organization         Address  Phone  Notes  °[facility]  (888) 581-9988   °Julie Brannon, PhD [address] [place], [place]   (336) 349-5553 or (336) 951-0000   °[facility]   [address] °[place], [place] (336) 349-4454   °[facility] [address], [place],  (336) 342-8316 Insurance/Medicaid/sponsorship through [facility]  °[facility] [address]                                    [place],  (336) 342-8316 Therapy/tele-psych/case  °[facility] [address]  ° [place],   [place] (336) 349-2233    °Dr. Arfeen  (336) 349-4544   °[facility] [place]  [employer] [place] [facility] 1) [address], [place] °2) [address], [place] °3)  [address], [place] (336) 349-3220 °(336) 342-7768 ° °(336) 342-8140   °[place] [facility] (336) 342-1394 or (336) 342-3537 (After Hours)    ° ° °

## 2014-12-01 NOTE — ED Provider Notes (Signed)
CSN: [id number]     Arrival date & time 12/01/14  1913 History  This chart was scribed for Earley FavorGail Kimerly Rowand, NP working with Elwin MochaBlair Walden, MD by Evon Slackerrance Branch, ED Scribe. This patient was seen in room WTR8/WTR8 and the patient's care was started at 9:13 PM.    Chief Complaint  Patient presents with  . Dental Pain   The history is provided by the patient. No language interpreter was used.   HPI Comments: Duane Price is a 29 y.o. male who presents to the Emergency Department complaining of recurrent low right sided dental pain onset several weeks prior. Pt states that the pain intermittently radiates up into his jaw and right ear. Pt states that he would like another dentist referral after being seen in the ED several times for the same dental pain. Pt states that he has tried Orajel with no relief. Pt doesn't report any other symptoms.   History reviewed. No pertinent past medical history. Past Surgical History  Procedure Laterality Date  . Facial surgery     No family history on file. History  Substance Use Topics  . Smoking status: Current Every Day Smoker -- 0.50 packs/day    Types: Cigarettes  . Smokeless tobacco: Not on file  . Alcohol Use: Yes    Review of Systems  HENT: Positive for dental problem. Negative for ear pain and facial swelling.       Allergies  Review of patient's allergies indicates no known allergies.  Home Medications   Prior to Admission medications   Medication Sig Start Date End Date Taking? Authorizing Provider  HYDROcodone-acetaminophen (NORCO/VICODIN) 5-325 MG per tablet Take 1-2 tablets by mouth every 4 (four) hours as needed. 11/04/14   Oswaldo ConroyVictoria Creech, PA-C  traMADol (ULTRAM) 50 MG tablet Take 1 tablet (50 mg total) by mouth every 6 (six) hours as needed. 12/01/14   Earley FavorGail Yannely Kintzel, NP   BP 149/69 mmHg  Pulse 95  Temp(Src) 98.4 F (36.9 C) (Oral)  Resp 16  SpO2 99%   Physical Exam  Constitutional: He appears well-developed and well-nourished.   HENT:  Head: Normocephalic.  Mouth/Throat:    No gum/faical  swelling  Eyes: Pupils are equal, round, and reactive to light.  Neck: Normal range of motion.  Cardiovascular: Normal rate.   Pulmonary/Chest: Effort normal.  Musculoskeletal: Normal range of motion.  Lymphadenopathy:    He has no cervical adenopathy.  Neurological: He is alert.  Skin: Skin is warm and dry.  Nursing note and vitals reviewed.   ED Course  Procedures (including critical care time) DIAGNOSTIC STUDIES: Oxygen Saturation is 99% on RA, normal by my interpretation.    COORDINATION OF CARE: 9:40 PM-Discussed treatment plan with pt at bedside and pt agreed to plan.     Labs Review Labs Reviewed - No data to display  Imaging Review No results found.   EKG Interpretation None     Discussed actually making a dental appointment as this is the 10th ED fro dental pain since 03/2012 MDM   Final diagnoses:  Dental caries      I personally performed the services described in this documentation, which was scribed in my presence. The recorded information has been reviewed and is accurate.     Earley FavorGail Sophie Tamez, NP 12/01/14 [id number]  Elwin MochaBlair Walden, MD 12/02/14 [phone]

## 2015-02-11 ENCOUNTER — Emergency Department (HOSPITAL_COMMUNITY)
Admission: EM | Admit: 2015-02-11 | Discharge: 2015-02-11 | Disposition: A | Discharge status: Home / Self Care | Payer: Self-pay | Attending: Emergency Medicine | Admitting: Emergency Medicine

## 2015-02-11 DIAGNOSIS — K088 Other specified disorders of teeth and supporting structures: Secondary | ICD-10-CM | POA: Insufficient documentation

## 2015-02-11 DIAGNOSIS — K089 Disorder of teeth and supporting structures, unspecified: Secondary | ICD-10-CM

## 2015-02-11 DIAGNOSIS — K029 Dental caries, unspecified: Secondary | ICD-10-CM | POA: Insufficient documentation

## 2015-02-11 DIAGNOSIS — G8929 Other chronic pain: Secondary | ICD-10-CM | POA: Insufficient documentation

## 2015-02-11 DIAGNOSIS — Z720 Tobacco use: Secondary | ICD-10-CM | POA: Insufficient documentation

## 2015-02-11 MED ORDER — PENICILLIN V POTASSIUM 500 MG PO TABS: 500.0000 mg | ORAL_TABLET | Freq: Three times a day (TID) | ORAL | Status: DC

## 2015-02-11 NOTE — ED Notes (Signed)
Upon entering room, unable to locate patient

## 2015-02-11 NOTE — ED Provider Notes (Signed)
CSN: [id number]     Arrival date & time 02/11/15  1532 History  This chart was scribed for non-physician practitioner Fayrene HelperBowie Mallary Kreger, PA, working with [name], MD, by Tanda RockersMargaux Venter, ED Scribe. This patient was seen in room WTR9/WTR9 and the patient's care was started at 3:41 PM.    No chief complaint on file.  The history is provided by the patient. No language interpreter was used.     HPI Comments: Duane Price is a 29 y.o. male who presents to the Emergency Department complaining of constant, right lower molar pain x several months. Pt has been taking Tylenol and Goody Powder without relief. The pain is exacerbated with cold air exposure, eating something sweet, and applying pressure. He reports that the pain is also worse at night time. Denies fever, chills, shortness of breath or any other symptoms. Pt reports that he was seen recently in the ED for the same dental pain. He was seeking referral to dentist at that time but left AMA because the PA who saw him seemed disengaged which upset the pt, causing him to leave. He has had 9 ED visits for dental pain since 2014.   No past medical history on file. Past Surgical History  Procedure Laterality Date  . Facial surgery     No family history on file. History  Substance Use Topics  . Smoking status: Current Every Day Smoker -- 0.50 packs/day    Types: Cigarettes  . Smokeless tobacco: Not on file  . Alcohol Use: Yes    Review of Systems  Constitutional: Negative for fever and chills.  HENT: Positive for dental problem.   Respiratory: Negative for shortness of breath.       Allergies  Review of patient's allergies indicates no known allergies.  Home Medications   Prior to Admission medications   Medication Sig Start Date End Date Taking? Authorizing Provider  HYDROcodone-acetaminophen (NORCO/VICODIN) 5-325 MG per tablet Take 1-2 tablets by mouth every 4 (four) hours as needed. 11/04/14   Oswaldo ConroyVictoria Creech, PA-C  penicillin v  potassium (VEETID) 500 MG tablet Take 1 tablet (500 mg total) by mouth 3 (three) times daily. 02/11/15   Fayrene HelperBowie Sani Madariaga, PA-C  traMADol (ULTRAM) 50 MG tablet Take 1 tablet (50 mg total) by mouth every 6 (six) hours as needed. 12/01/14   Earley FavorGail Schulz, NP   There were no vitals taken for this visit.   Physical Exam  Constitutional: He is oriented to person, place, and time. He appears well-developed and well-nourished. No distress.  HENT:  Head: Normocephalic and atraumatic.  Significant dental decay noted to the right lower premolar tooth #31 without gingival erythema.  No trismus.   Eyes: Conjunctivae and EOM are normal.  Neck: Neck supple. No tracheal deviation present.  Cardiovascular: Normal rate.   Pulmonary/Chest: Effort normal. No respiratory distress.  Musculoskeletal: Normal range of motion.  Neurological: He is alert and oriented to person, place, and time.  Skin: Skin is warm and dry.  Psychiatric: He has a normal mood and affect. His behavior is normal.  Nursing note and vitals reviewed.   ED Course  Procedures (including critical care time)  DIAGNOSTIC STUDIES: Pt left before vital sign can be obtained.   COORDINATION OF CARE: 3:44 PM- Will give pt referral to dentist and prescribe antibiotic. Pt is asking for pain medication at this time and demonstrates drug seeking behavior. Offered dental block to pt but he declined.    Labs Review Labs Reviewed - No data to  display  Imaging Review No results found.   EKG Interpretation None      MDM   Final diagnoses:  Chronic dental pain  Dental decay   I personally performed the services described in this documentation, which was scribed in my presence. The recorded information has been reviewed and is accurate.       Fayrene HelperBowie Linnie Mcglocklin, PA-C 02/11/15 1554  [name], MD 02/11/15 [phone]

## 2015-02-11 NOTE — Discharge Instructions (Signed)
Follow up promptly with a dentist for further management of your dental pain.  Dental Caries Dental caries (also called tooth decay) is the most common oral disease. It can occur at any age but is more common in children and young adults.  HOW DENTAL CARIES DEVELOPS  The process of decay begins when bacteria and foods (particularly sugars and starches) combine in your mouth to produce plaque. Plaque is a substance that sticks to the hard, outer surface of a tooth (enamel). The bacteria in plaque produce acids that attack enamel. These acids may also attack the root surface of a tooth (cementum) if it is exposed. Repeated attacks dissolve these surfaces and create holes in the tooth (cavities). If left untreated, the acids destroy the other layers of the tooth.  RISK FACTORS  Frequent sipping of sugary beverages.   Frequent snacking on sugary and starchy foods, especially those that easily get stuck in the teeth.   Poor oral hygiene.   Dry mouth.   Substance abuse such as methamphetamine abuse.   Broken or poor-fitting dental restorations.   Eating disorders.   Gastroesophageal reflux disease (GERD).   Certain radiation treatments to the head and neck. SYMPTOMS In the early stages of dental caries, symptoms are seldom present. Sometimes white, chalky areas may be seen on the enamel or other tooth layers. In later stages, symptoms may include:  Pits and holes on the enamel.  Toothache after sweet, hot, or cold foods or drinks are consumed.  Pain around the tooth.  Swelling around the tooth. DIAGNOSIS  Most of the time, dental caries is detected during a regular dental checkup. A diagnosis is made after a thorough medical and dental history is taken and the surfaces of your teeth are checked for signs of dental caries. Sometimes special instruments, such as lasers, are used to check for dental caries. Dental X-ray exams may be taken so that areas not visible to the eye (such  as between the contact areas of the teeth) can be checked for cavities.  TREATMENT  If dental caries is in its early stages, it may be reversed with a fluoride treatment or an application of a remineralizing agent at the dental office. Thorough brushing and flossing at home is needed to aid these treatments. If it is in its later stages, treatment depends on the location and extent of tooth destruction:   If a small area of the tooth has been destroyed, the destroyed area will be removed and cavities will be filled with a material such as gold, silver amalgam, or composite resin.   If a large area of the tooth has been destroyed, the destroyed area will be removed and a cap (crown) will be fitted over the remaining tooth structure.   If the center part of the tooth (pulp) is affected, a procedure called a root canal will be needed before a filling or crown can be placed.   If most of the tooth has been destroyed, the tooth may need to be pulled (extracted). HOME CARE INSTRUCTIONS You can prevent, stop, or reverse dental caries at home by practicing good oral hygiene. Good oral hygiene includes:  Thoroughly cleaning your teeth at least twice a day with a toothbrush and dental floss.   Using a fluoride toothpaste. A fluoride mouth rinse may also be used if recommended by your dentist or health care provider.   Restricting the amount of sugary and starchy foods and sugary liquids you consume.   Avoiding frequent snacking on  these foods and sipping of these liquids.   Keeping regular visits with a dentist for checkups and cleanings. PREVENTION   Practice good oral hygiene.  Consider a dental sealant. A dental sealant is a coating material that is applied by your dentist to the pits and grooves of teeth. The sealant prevents food from being trapped in them. It may protect the teeth for several years.  Ask about fluoride supplements if you live in a community without fluorinated water  or with water that has a low fluoride content. Use fluoride supplements as directed by your dentist or health care provider.  Allow fluoride varnish applications to teeth if directed by your dentist or health care provider. Document Released: 06/03/2002 Document Revised: 01/26/2014 Document Reviewed: 09/13/2012 [facility] Patient Information 2015 [place], [place]. This information is not intended to replace advice given to you by your health care provider. Make sure you discuss any questions you have with your health care provider.

## 2015-02-23 ENCOUNTER — Emergency Department (HOSPITAL_COMMUNITY)
Admission: EM | Admit: 2015-02-23 | Discharge: 2015-02-23 | Disposition: A | Discharge status: Home / Self Care | Payer: Self-pay | Attending: Emergency Medicine | Admitting: Emergency Medicine

## 2015-02-23 ENCOUNTER — Encounter (HOSPITAL_COMMUNITY): Payer: Self-pay | Admitting: Emergency Medicine

## 2015-02-23 DIAGNOSIS — K088 Other specified disorders of teeth and supporting structures: Secondary | ICD-10-CM | POA: Insufficient documentation

## 2015-02-23 DIAGNOSIS — Z792 Long term (current) use of antibiotics: Secondary | ICD-10-CM | POA: Insufficient documentation

## 2015-02-23 DIAGNOSIS — K0889 Other specified disorders of teeth and supporting structures: Secondary | ICD-10-CM

## 2015-02-23 DIAGNOSIS — Z720 Tobacco use: Secondary | ICD-10-CM | POA: Insufficient documentation

## 2015-02-23 MED ORDER — KETOROLAC TROMETHAMINE 60 MG/2ML IM SOLN
60.0000 mg | Freq: Once | INTRAMUSCULAR | Status: DC
  Filled 2015-02-23: qty 2

## 2015-02-23 NOTE — ED Notes (Signed)
Per patient right lower dental pain for a couple of days

## 2015-02-23 NOTE — ED Notes (Signed)
Pt not in room. Toradol not given. PA advised

## 2015-02-23 NOTE — ED Notes (Signed)
Pt has not returned to room. Pt is not in the department. PA advised

## 2015-02-23 NOTE — ED Provider Notes (Signed)
CSN: [id number]     Arrival date & time 02/23/15  1323 History  This chart was scribed for a non-physician practitioner, Oswaldo Conroy, PA-C working with Blane Ohara, MD by [place] Peace, ED Scribe. The patient was seen in WTR7/WTR7. The patient's care was started at 3:34 PM.    Chief Complaint  Patient presents with  . Dental Pain      Patient is a 29 y.o. male presenting with tooth pain. The history is provided by the patient. No language interpreter was used.  Dental Pain Associated symptoms: no drooling, no facial swelling and no fever     HPI Comments: [place] Pettengill is a 29 y.o. male who presents to the Emergency Department complaining of severe lower right-sided dental pain over the past few months that has gotten gradually worse. He describes pain as "aching" and "throbbing". Pt notes he has tried taking Ibuprofen and Tylenol without any relief. No complaints of fever, chills, nausea, or vomiting. Pt does not currently have a dentist that he follows up with regularly. Pt is current everyday smoker.    History reviewed. No pertinent past medical history. Past Surgical History  Procedure Laterality Date  . Facial surgery     No family history on file. History  Substance Use Topics  . Smoking status: Current Every Day Smoker -- 0.50 packs/day    Types: Cigarettes  . Smokeless tobacco: Not on file  . Alcohol Use: Yes    Review of Systems  Constitutional: Negative for fever and chills.  HENT: Positive for dental problem. Negative for drooling and facial swelling.   Gastrointestinal: Negative for nausea and vomiting.      Allergies  Review of patient's allergies indicates no known allergies.  Home Medications   Prior to Admission medications   Medication Sig Start Date End Date Taking? Authorizing Provider  HYDROcodone-acetaminophen (NORCO/VICODIN) 5-325 MG per tablet Take 1-2 tablets by mouth every 4 (four) hours as needed. 11/04/14   Oswaldo Conroy, PA-C   penicillin v potassium (VEETID) 500 MG tablet Take 1 tablet (500 mg total) by mouth 3 (three) times daily. 02/11/15   Fayrene Helper, PA-C  traMADol (ULTRAM) 50 MG tablet Take 1 tablet (50 mg total) by mouth every 6 (six) hours as needed. 12/01/14   Earley Favor, NP   BP 129/87 mmHg  Pulse 113  Temp(Src) 97.8 F (36.6 C) (Oral)  Resp 19  SpO2 96% Physical Exam  Constitutional: He appears well-developed and well-nourished. No distress.  HENT:  Head: Normocephalic and atraumatic.  Mouth/Throat: Oropharynx is clear and moist. No oropharyngeal exudate.  No trismus or uvula deviation No lip, tongue, facial swelling. No swelling under tongue or tenderness. Patient handling secretions. No drooling. Patient with poor dentition. Patient with tenderness to right lower back molar with decay and  mild erythema in this gingiva but no drainable abscess.   Eyes: Conjunctivae are normal. Right eye exhibits no discharge. Left eye exhibits no discharge.  Neck: Normal range of motion. Neck supple.  No neck masses or tenderness.  Cardiovascular: Normal rate and regular rhythm.   Pulmonary/Chest: Effort normal and breath sounds normal. No respiratory distress. He has no wheezes.  Abdominal: Soft. He exhibits no distension. There is no tenderness.  Lymphadenopathy:    He has no cervical adenopathy.  Neurological: He is alert. Coordination normal.  Skin: Skin is warm and dry. He is not diaphoretic.  Nursing note and vitals reviewed.   ED Course  Procedures (including critical care time) Labs Review Labs Reviewed -  No data to display  Imaging Review No results found.   EKG Interpretation None     Medications  ketorolac (TORADOL) injection 60 mg (not administered)    3:36 PM- Treatment plan was discussed with patient who verbalizes understanding and agrees.   MDM   Final diagnoses:  Pain, dental   Patient presenting with dental pain. Exam non-concerning for blood went angina. Pain is chronic.  Discussed toradol injection versus dental block. Patient refused dental block and she did in Toradol. Patient left before portal injection will be discussed no narcotics for chronic dental pain. Concern for drug-seeking behavior. Patient left for paperwork and without telling any staff members.   Oswaldo ConroyVictoria Diego Delancey, PA-C 02/23/15 [id number]  Blane OharaJoshua Zavitz, MD 02/27/15 [phone]

## 2015-10-16 ENCOUNTER — Encounter (HOSPITAL_COMMUNITY): Payer: Self-pay | Admitting: Emergency Medicine

## 2015-10-16 ENCOUNTER — Emergency Department (HOSPITAL_COMMUNITY)
Admission: EM | Admit: 2015-10-16 | Discharge: 2015-10-16 | Disposition: A | Discharge status: Home / Self Care | Payer: Self-pay | Attending: Emergency Medicine | Admitting: Emergency Medicine

## 2015-10-16 DIAGNOSIS — Z792 Long term (current) use of antibiotics: Secondary | ICD-10-CM | POA: Insufficient documentation

## 2015-10-16 DIAGNOSIS — Y9289 Other specified places as the place of occurrence of the external cause: Secondary | ICD-10-CM | POA: Insufficient documentation

## 2015-10-16 DIAGNOSIS — F17210 Nicotine dependence, cigarettes, uncomplicated: Secondary | ICD-10-CM | POA: Insufficient documentation

## 2015-10-16 DIAGNOSIS — Y9389 Activity, other specified: Secondary | ICD-10-CM | POA: Insufficient documentation

## 2015-10-16 DIAGNOSIS — Y998 Other external cause status: Secondary | ICD-10-CM | POA: Insufficient documentation

## 2015-10-16 DIAGNOSIS — T401X1A Poisoning by heroin, accidental (unintentional), initial encounter: Secondary | ICD-10-CM | POA: Insufficient documentation

## 2015-10-16 MED ORDER — NAPROXEN 500 MG PO TABS
500.0000 mg | ORAL_TABLET | Freq: Two times a day (BID) | ORAL | Status: DC
  Administered 2015-10-16: 500 mg via ORAL
  Filled 2015-10-16: qty 1

## 2015-10-16 MED ORDER — ACETAMINOPHEN 500 MG PO TABS
1000.0000 mg | ORAL_TABLET | Freq: Once | ORAL | Status: AC
  Administered 2015-10-16: 1000 mg via ORAL
  Filled 2015-10-16: qty 2

## 2015-10-16 NOTE — ED Notes (Signed)
Per EMS pt sent for evaluation post overdose; EMS responded to pt laying supine in kitchen apneic, diaphoretic, and pale; given 2 mg narcan intranasal with little response; given 0.5 mg narcan IV and now a/o x4. 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

## 2015-10-16 NOTE — ED Notes (Signed)
Bed: ZO10 Expected date:  Expected time:  Means of arrival:  Comments: EMS 30 yo- Narcan after unresponsive-now A&Ox4

## 2015-10-16 NOTE — Discharge Instructions (Signed)
°Emergency Department Resource Guide °1) Find a Doctor and Pay Out of Pocket °Although you won't have to find out who is covered by your insurance plan, it is a good idea to ask around and get recommendations. You will then need to call the office and see if the doctor you have chosen will accept you as a new patient and what types of options they offer for patients who are self-pay. Some doctors offer discounts or will set up payment plans for their patients who do not have insurance, but you will need to ask so you aren't surprised when you get to your appointment. ° °2) Contact Your Local Health Department °Not all health departments have doctors that can see patients for sick visits, but many do, so it is worth a call to see if yours does. If you don't know where your local health department is, you can check in your phone book. The CDC also has a tool to help you locate your state's health department, and many state websites also have listings of all of their local health departments. ° °3) Find a Walk-in Clinic °If your illness is not likely to be very severe or complicated, you may want to try a walk in clinic. These are popping up all over the country in pharmacies, drugstores, and shopping centers. They're usually staffed by nurse practitioners or physician assistants that have been trained to treat common illnesses and complaints. They're usually fairly quick and inexpensive. However, if you have serious medical issues or chronic medical problems, these are probably not your best option. ° °No Primary Care Doctor: °- Call [facility] at  832-8000 - they can help you locate a primary care doctor that  accepts your insurance, provides certain services, etc. °- Physician Referral Service- 1-800-533-3463 ° °Chronic Pain Problems: °Organization         Address  Phone   Notes  °[facility]  (336) 297-2271 Patients need to be referred by their primary care doctor.  ° °Medication  Assistance: °Organization         Address  Phone   Notes  °[place] [facility] [address] °[place], [place] [address] (336) 641-8030 --Must be a resident of [place] °-- Must have NO insurance coverage whatsoever (no Medicaid/ Medicare, etc.) °-- The pt. MUST have a primary care doctor that directs their care regularly and follows them in the community °  °[facility]  (866) 331-1348   °[employer]  (888) 892-1162   ° °Agencies that provide inexpensive medical care: °Organization         Address  Phone   Notes  °[facility]  (336) 832-8035   °[facility]    (336) 832-7272   °[facility] [address] °[place], [place] [address] (336) 832-4777   °[facility] [address], °[place] (336) 271-4999   °[facility]    (336) 373-0678   °[facility]    (336) 272-1050   °[facility] ° [address], [place] Phone:  (336) 832-4444, Fax:  (336) 832-4440 Hours of Operation:  9 am - 6 pm, M-F.  Also accepts Medicaid/Medicare and self-pay.  °[facility] ° [address], [place] Phone: (336) [phone], Fax: (336) [phone]. Hours of Operation:  8:30 am - 5:30 pm, M-F.  Also accepts Medicaid and self-pay.  °[facility] [place] [address], [place] Phone: (336) 878-6027   °[facility] [address], [place], [place] (336)723-1848, Ext. 123 Mondays & Thursdays: 7-9 AM.  First 15 patients are seen on a first come, first serve basis. °  ° °Medicaid-accepting [place] Providers: ° °Organization         Address  Phone   Notes  °[facility] [address], [place] (336) 641-2100 Also accepts self-pay patients.  °[facility] [address], [place] ° (336) 856-9996   °[facility] [address], [place]  (336) 288-8857   °[facility] [address] [place] [address], [place] (336) 299-7000   °Veita Bland [address], [place]  ° (336) 373-1557 Only accepts [place] Access Medicaid patients after they have their name applied to their card.  ° °Self-Pay (no insurance) in [place]: ° °Organization         Address  Phone   Notes  °Sickle Cell Patients, [facility] [address], [place] (336) 832-1970   °[facility] [address], [place] (336) 832-4400   °[facility] ° [address], [place] (336) 992-4800   °[facility]/Dr. Osei-Bonsu ° [address] [place] [address], [place] or [address], [place] (336) 841-8500 Phone number for both [place] and [place] locations is the same.  °[facility] [address], [place] (336) 299-0000   °[facility] [address] [place] [address], [place] or [address] (336) 852-7530 °(336) 878-2260   °[facility] [address], [place] (336) 350-1642, phone; (336) 294-5005, fax Sees patients 1st and 3rd Saturday of every month.  Must not qualify for public or private insurance (i.e. Medicaid, Medicare, [place] Health Choice, Veterans' Benefits) • Household income should be no more than 200% of the poverty level •The clinic cannot treat you if you are pregnant or think you are pregnant • Sexually transmitted diseases are not treated at the clinic.  ° ° °Dental Care: °Organization         Address  Phone  Notes  °[place] [facility] [address], [place] (336) 641-6152 Accepts children up to age 21 who are enrolled in Medicaid or [place] Health Choice; pregnant women with a Medicaid card; and children who have applied for Medicaid or [place] Health Choice, but were declined, whose parents can pay a reduced fee at time of service.  °[place]  [facility] [place]  [address], [place] (336) 641-7733 Accepts children up to age 21 who are enrolled in Medicaid or [place] Health Choice; pregnant women with a Medicaid card; and children who have applied for Medicaid or [place] Health Choice, but were declined, whose parents can pay a reduced fee at time of service.  °[facility] ° [address], [place] (336) 641-4533 Patients are seen by appointment only. Walk-ins are not accepted. [facility] will see patients 18 years of age and older. °Monday - Tuesday (8am-5pm) °Most Wednesdays (8:30-5pm) °$30 per visit, cash only  °[facility] ° [address], [place] (336) 641-4533 Patients are seen by appointment only. Walk-ins are not accepted. [facility] will see patients 18 years of age and older. °One   Wednesday Evening (Monthly: Volunteer Based).  $30 per visit, cash only  °[facility]  (919) 537-3737 for adults; Children under age 4, call Graduate Pediatric Dentistry at ([phone]) [phone]. Children aged 4-14, please call ([phone]) [phone] to request a pediatric application. ° Dental services are provided in all areas of dental care including fillings, crowns and bridges, complete and partial dentures, implants, gum treatment, root canals, and extractions. Preventive care is also provided. Treatment is provided to both adults and children. °Patients are selected via a lottery and there is often a waiting list. °  °[facility] [address], °[place] ° (336) 763-8833 [email or web address] °  °[facility] [address], [place], [place] (336)723-1848, Ext. 123 Second and Fourth Thursday of each month, opens at 6:30 AM; Clinic ends at 9 AM.  Patients are seen on a first-come first-served basis, and a limited number are seen during each clinic.  ° °[facility] ° [address], [place], [place] (336) [phone]    Eligibility Requirements °You must have lived in [place], or [place] counties for at least the last three months. °  You cannot be eligible for state or federal sponsored healthcare insurance, including Veterans Administration, Medicaid, or Medicare. °  You generally cannot be eligible for healthcare insurance through your employer.  °  How to apply: °Eligibility screenings are held every Tuesday and Wednesday afternoon from 1:00 pm until 4:00 pm. You do not need an appointment for the interview!  °[facility] [address], [place], [place] 336-631-2330   °[facility]  336-342-8273   °[facility]  336-703-3100   °[facility]  336-570-6415   ° °Behavioral Health Resources in the Community: °Intensive Outpatient Programs °Organization         Address  Phone  Notes  °[facility] [address], [place], [place] 336-878-6098   °[facility] [address], [place], [place] 336-832-9800   °[facility] [address], [place], [place] ° 336-882-2125   °[facility] [address],  °[place], [place] 1-800-853-5163 or 336-641-4981   °Substance Abuse Resources °Organization         Address  Phone  Notes  °[facility]  336-882-2125   °[facility]  336-784-9470   °[facility]  336-285-9073   °[facility]  336-845-3988   °Residential & Outpatient Substance Abuse Program  1-800-659-3381   °Psychological Services °Organization         Address  Phone  Notes  °[facility]  336- 832-9600   °[facility]  336- 378-7881   °[facility] [address], [place] 1-800-853-5163 or 336-641-4981   ° °Mobile Crisis Teams °Organization         Address  Phone  Notes  °[facility], Mobile Crisis Care Unit  1-877-626-1772   °Assertive °[facility] ° [address]  [place], [place] 336-834-9664   °Sharon DeEsch [address] °[place] 336-554-5454   ° °Self-Help/Support Groups °Organization         Address  Phone             Notes  °[facility]  - variety of support groups  336- 373-1402 Call for more information  °Narcotics Anonymous (NA), [facility] [address], °[place]  2 meetings at this location  ° °  Residential Treatment Programs Organization         Address  Phone  Notes  [facility] [address]    [place]  1-817-486-3740   [facility]  [address] [place] [address], [place], [place]   [place], [place] (920)[phone] Admissions: 8am-3pm M-F  [facility] [address]    [place], [place] 893-810-1751   [facility] [address] [place], [place], [place]   [facility] [address],  [place], [place]   [facility] Intensive Outpatient [address], [place], [place]   [facility] ([place]  [place], [place] 1-343-317-1586 or 332-253-0270   [facility] ([facility] [address] [place], [place] Accepts Medicaid  [facility] [address],  [place] 1-(445) 494-1659 Substance Abuse/Addiction Treatment   [facility]         Address  Phone  Notes  [facility]  678-162-4555   Domenic Schwab, PhD [address] [place], [place]   856-161-8914 or (231) 311-2104   Amanda Jerome [place], [place] 209-795-7326   [facility] [address] [place], [place] 813-186-0707 Insurance/Medicaid/sponsorship through [facility] [address]                                    [place], [place] [phone] [place], [place] [phone]    Dr. Adele Schilder  ([phone]   [facility] Dept. 1) [address]  2) [place] 3)  [address], [place] ([phone]) [phone]  941-590-0559   [place] (256)164-6925 or 667-311-5740 (After Hours)       Accidental Overdose A drug overdose occurs when a chemical substance (drug or medication) is used in amounts large enough to overcome a person. This may result in severe illness or death. This is a type of poisoning. Accidental overdoses of medications or other substances come from a variety of reasons. When this happens accidentally, it is often because the person taking the substance does not know enough about what they have taken. Drugs which commonly cause overdose deaths are alcohol, psychotropic medications (medications which affect the mind), pain medications, illegal drugs (street drugs) such as cocaine and heroin, and multiple drugs taken at the same time. It may result from careless behavior (such as over-indulging at a party). Other causes of overdose may include multiple drug use, a lapse in memory, or drug use after a period of no drug use.  Sometimes overdosing occurs because a person cannot remember if they have taken their medication.  A common unintentional overdose in young children involves multi-vitamins containing iron. Iron is a part of the hemoglobin molecule in blood. It is used to transport oxygen to living cells. When taken in small amounts, iron allows the body to restock hemoglobin. In large amounts, it causes problems in the body. If this overdose is not treated, it can lead to death. Never take medicines that show signs of tampering or do not seem  quite right. Never take medicines in the dark or in poor lighting. Read the label and check each dose of medicine before you take it.  When adults are poisoned, it happens most often through carelessness or lack of information. Taking medicines in the dark or taking medicine prescribed for someone else to treat the same type of problem is a dangerous practice. SYMPTOMS  Symptoms of overdose depend on the medication and amount taken. They can vary from over-activity with stimulant over-dosage, to sleepiness from depressants such as alcohol, narcotics and tranquilizers. Confusion, dizziness, nausea and vomiting may be present. If problems are severe enough coma and death may result. DIAGNOSIS  Diagnosis and management are generally straightforward if the drug is known. Otherwise it is more difficult. At times, certain symptoms and signs exhibited by the patient, or blood tests, can reveal the drug in question.  TREATMENT  In an emergency department, most patients can be treated with supportive measures. Antidotes may be available if there has been an overdose of opioids or benzodiazepines. A rapid improvement will often occur if this is the cause of overdose. At home or away from medical care:  There may be no immediate problems or warning signs in children.  Not everything works well in all cases of poisoning.  Take immediate action. Poisons may act quickly.  If you think someone has swallowed medicine or a household product, and the person is unconscious, having seizures (convulsions), or is not breathing, immediately call for an ambulance. IF a person is conscious and appears to be doing OK but has swallowed a poison:  Do not wait to see what effect the poison will have. Immediately call a poison control center (listed in the white pages of your telephone book under "Poison Control" or inside the front cover with other emergency numbers). Some poison control centers have TTY capability for the deaf. Check with your local center if you or someone in your family requires this service.  Keep the container so you can read the label  on the product for ingredients.  Describe what, when, and how much was taken and the age and condition of the person poisoned. Inform them if the person is vomiting, choking, drowsy, shows a change in color or temperature of skin, is conscious or unconscious, or is convulsing.  Do not cause vomiting unless instructed by medical personnel. Do not induce vomiting or force liquids into a person who is convulsing, unconscious, or very drowsy. Stay calm and in control.   Activated charcoal also is sometimes used in certain types of poisoning and you may wish to add a supply to your emergency medicines. It is available without a prescription. Call a poison control center before using this medication. PREVENTION  Thousands of children die every year from unintentional poisoning. This may be from household chemicals, poisoning from carbon monoxide in a car, taking their parent's medications, or simply taking a few iron pills or vitamins with iron. Poisoning comes from unexpected sources.  Store medicines out of the sight and reach of children, preferably in a locked cabinet. Do not keep medications in a food cabinet. Always store your medicines in a secure place. Get rid of expired medications.  If you have children living with you or have them as occasional guests, you should have child-resistant caps on your medicine containers. Keep everything out of reach. Child proof your home.  If you are called to the telephone or to answer the door while you are taking a medicine, take  the container with you or put the medicine out of the reach of small children.  Do not take your medication in front of children. Do not tell your child how good a medication is and how good it is for them. They may get the idea it is more of a treat.  If you are an adult and have accidentally taken an overdose, you need to consider how this happened and what can be done to prevent it from happening again. If this was from a street  drug or alcohol, determine if there is a problem that needs addressing. If you are not sure a problems exists, it is easy to talk to a professional and ask them if they think you have a problem. It is better to handle this problem in this way before it happens again and has a much worse consequence.   This information is not intended to replace advice given to you by your health care provider. Make sure you discuss any questions you have with your health care provider.   Document Released: 11/25/2004 Document Revised: 10/02/2014 Document Reviewed: 03/01/2015 Elsevier Interactive Patient Education [employer]

## 2015-10-16 NOTE — ED Provider Notes (Addendum)
CSN: [id number]     Arrival date & time 10/16/15  1100 History   First MD Initiated Contact with Patient 10/16/15 1104     Chief Complaint  Patient presents with  . Drug Overdose     (Consider location/radiation/quality/duration/timing/severity/associated sxs/prior Treatment) Patient is a 30 y.o. male presenting with Overdose. The history is provided by the patient.  Drug Overdose This is a new problem. The current episode started less than 1 hour ago. The problem occurs constantly. The problem has been resolved. Associated symptoms include headaches. Pertinent negatives include no chest pain, no abdominal pain and no shortness of breath. Nothing aggravates the symptoms. Nothing relieves the symptoms. He has tried nothing for the symptoms. The treatment provided no relief.    29 yo M with a cc of heroin overdose. Patient states this was an accident. Denies suicidal or homicidal ideation. Per bystanders he was found on the floor unresponsive. Was mildly cyanotic. EMS gave him Narcan with immediate improvement. Patient complaining of a mild headache and some nausea. Denies vomiting or diarrhea. Was never had an overdose before.  History reviewed. No pertinent past medical history. Past Surgical History  Procedure Laterality Date  . Facial surgery     No family history on file. Social History  Substance Use Topics  . Smoking status: Current Every Day Smoker -- 0.50 packs/day    Types: Cigarettes  . Smokeless tobacco: None  . Alcohol Use: Yes    Review of Systems  Constitutional: Negative for fever and chills.  HENT: Negative for congestion and facial swelling.   Eyes: Negative for discharge and visual disturbance.  Respiratory: Negative for shortness of breath.   Cardiovascular: Negative for chest pain and palpitations.  Gastrointestinal: Positive for nausea. Negative for vomiting, abdominal pain and diarrhea.  Musculoskeletal: Negative for myalgias and arthralgias.  Skin:  Negative for color change and rash.  Neurological: Positive for headaches. Negative for tremors and syncope.  Psychiatric/Behavioral: Negative for confusion and dysphoric mood.      Allergies  Review of patient's allergies indicates no known allergies.  Home Medications   Prior to Admission medications   Medication Sig Start Date End Date Taking? Authorizing Provider  HYDROcodone-acetaminophen (NORCO/VICODIN) 5-325 MG per tablet Take 1-2 tablets by mouth every 4 (four) hours as needed. 11/04/14   Oswaldo Conroy, PA-C  penicillin v potassium (VEETID) 500 MG tablet Take 1 tablet (500 mg total) by mouth 3 (three) times daily. 02/11/15   Fayrene Helper, PA-C  traMADol (ULTRAM) 50 MG tablet Take 1 tablet (50 mg total) by mouth every 6 (six) hours as needed. 12/01/14   Earley Favor, NP   BP 120/73 mmHg  Pulse 90  Temp(Src) 98.1 F (36.7 C) (Oral)  Resp 18  Ht 6' (1.829 m)  Wt 165 lb (74.844 kg)  BMI 22.37 kg/m2  SpO2 96% Physical Exam  Constitutional: He is oriented to person, place, and time. He appears well-developed and well-nourished.  HENT:  Head: Normocephalic and atraumatic.  Eyes: EOM are normal. Pupils are equal, round, and reactive to light.  Neck: Normal range of motion. Neck supple. No JVD present.  Cardiovascular: Normal rate and regular rhythm.  Exam reveals no gallop and no friction rub.   No murmur heard. Pulmonary/Chest: No respiratory distress. He has no wheezes.  Abdominal: He exhibits no distension. There is no tenderness. There is no rebound and no guarding.  Musculoskeletal: Normal range of motion.  Neurological: He is alert and oriented to person, place, and time.  Skin:  No rash noted. No pallor.  Psychiatric: He has a normal mood and affect. His behavior is normal.  Nursing note and vitals reviewed.   ED Course  Procedures (including critical care time) Labs Review Labs Reviewed - No data to display  Imaging Review No results found. I have personally  reviewed and evaluated these images and lab results as part of my medical decision-making.   EKG Interpretation   Date/Time:  Saturday October 16 2015 11:17:08 EST Ventricular Rate:  87 PR Interval:  150 QRS Duration: 95 QT Interval:  372 QTC Calculation: 447 R Axis:   89 Text Interpretation:  Sinus rhythm No significant change since last  tracing Confirmed by Sharrie Self MD, Reuel Boom ([id number]) on 10/16/2015 11:49:07 AM      MDM   Final diagnoses:  Heroin overdose, accidental or unintentional, initial encounter    30 yo M with a chief complaint of heroin overdose.  Mild headache, vitals stable.  Feel safe for D/c.    11:47 AM:  I have discussed the diagnosis/risks/treatment options with the patient and family and believe the pt to be eligible for discharge home to follow-up with PCP. We also discussed returning to the ED immediately if new or worsening sx occur. We discussed the sx which are most concerning (e.g., sudden worsening pain, fever, inability to tolerate by mouth) that necessitate immediate return. Medications administered to the patient during their visit and any new prescriptions provided to the patient are listed below.  Medications given during this visit Medications  acetaminophen (TYLENOL) tablet 1,000 mg (not administered)  naproxen (NAPROSYN) tablet 500 mg (not administered)    New Prescriptions   No medications on file    The patient appears reasonably screen and/or stabilized for discharge and I doubt any other medical condition or other [facility] requiring further screening, evaluation, or treatment in the ED at this time prior to discharge.      Melene Plan, DO 10/16/15 1147  Melene Plan, DO 10/16/15 1148

## 2015-11-10 ENCOUNTER — Emergency Department (HOSPITAL_COMMUNITY)
Admission: EM | Admit: 2015-11-10 | Discharge: 2015-11-11 | Disposition: A | Discharge status: Home / Self Care | Payer: Self-pay | Attending: Emergency Medicine | Admitting: Emergency Medicine

## 2015-11-10 ENCOUNTER — Encounter (HOSPITAL_COMMUNITY): Payer: Self-pay | Admitting: Emergency Medicine

## 2015-11-10 DIAGNOSIS — Z792 Long term (current) use of antibiotics: Secondary | ICD-10-CM | POA: Insufficient documentation

## 2015-11-10 DIAGNOSIS — X500XXA Overexertion from strenuous movement or load, initial encounter: Secondary | ICD-10-CM | POA: Insufficient documentation

## 2015-11-10 DIAGNOSIS — S39012A Strain of muscle, fascia and tendon of lower back, initial encounter: Secondary | ICD-10-CM | POA: Insufficient documentation

## 2015-11-10 DIAGNOSIS — Y9389 Activity, other specified: Secondary | ICD-10-CM | POA: Insufficient documentation

## 2015-11-10 DIAGNOSIS — F17210 Nicotine dependence, cigarettes, uncomplicated: Secondary | ICD-10-CM | POA: Insufficient documentation

## 2015-11-10 DIAGNOSIS — Y990 Civilian activity done for income or pay: Secondary | ICD-10-CM | POA: Insufficient documentation

## 2015-11-10 DIAGNOSIS — Y9289 Other specified places as the place of occurrence of the external cause: Secondary | ICD-10-CM | POA: Insufficient documentation

## 2015-11-10 MED ORDER — KETOROLAC TROMETHAMINE 60 MG/2ML IM SOLN
60.0000 mg | Freq: Once | INTRAMUSCULAR | Status: AC
  Administered 2015-11-11: 60 mg via INTRAMUSCULAR
  Filled 2015-11-10: qty 2

## 2015-11-10 NOTE — ED Notes (Signed)
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 No alleviating factors.

## 2015-11-10 NOTE — ED Provider Notes (Signed)
CSN: [id number]     Arrival date & time 11/10/15  2136 History  By signing my name below, I, Budd Palmer, attest that this documentation has been prepared under the direction and in the presence of Gilda Crease, MD. Electronically Signed: Budd Palmer, ED Scribe. 11/10/2015. 11:51 PM.    Chief Complaint  Patient presents with  . Back Pain   The history is provided by the patient. No language interpreter was used.   HPI Comments: Duane Price is a 30 y.o. male smoker at 0.5 ppd who presents to the Emergency Department complaining of recurrent, constant, worsening lower back pain onset this morning. Pt reports a PMHx of the same, though he has never been seen for this. 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 He states that he typically wakes up the next day with intense pain, lasting for up to 1.5 weeks. He notes that this pain feels deep and is concerned that it may not be muscle-related. Pt denies pains shooting down the legs.   History reviewed. No pertinent past medical history. Past Surgical History  Procedure Laterality Date  . Facial surgery     No family history on file. Social History  Substance Use Topics  . Smoking status: Current Every Day Smoker -- 0.50 packs/day    Types: Cigarettes  . Smokeless tobacco: None  . Alcohol Use: Yes    Review of Systems  Musculoskeletal: Positive for back pain.  All other systems reviewed and are negative.   Allergies  Review of patient's allergies indicates no known allergies.  Home Medications   Prior to Admission medications   Medication Sig Start Date End Date Taking? Authorizing Provider  HYDROcodone-acetaminophen (NORCO/VICODIN) 5-325 MG per tablet Take 1-2 tablets by mouth every 4 (four) hours as needed. 11/04/14   Oswaldo Conroy, PA-C  penicillin v potassium (VEETID) 500 MG tablet Take 1 tablet (500 mg total) by mouth 3  (three) times daily. 02/11/15   Fayrene Helper, PA-C  traMADol (ULTRAM) 50 MG tablet Take 1 tablet (50 mg total) by mouth every 6 (six) hours as needed. 12/01/14   Earley Favor, NP   BP 126/75 mmHg  Pulse 86  Temp(Src) 97.5 F (36.4 C) (Oral)  Resp 20  SpO2 98% Physical Exam  Constitutional: He is oriented to person, place, and time. He appears well-developed and well-nourished. No distress.  HENT:  Head: Normocephalic and atraumatic.  Right Ear: Hearing normal.  Left Ear: Hearing normal.  Nose: Nose normal.  Mouth/Throat: Oropharynx is clear and moist and mucous membranes are normal.  Eyes: Conjunctivae and EOM are normal. Pupils are equal, round, and reactive to light.  Neck: Normal range of motion. Neck supple.  Cardiovascular: Regular rhythm, S1 normal and S2 normal.  Exam reveals no gallop and no friction rub.   No murmur heard. Pulmonary/Chest: Effort normal and breath sounds normal. No respiratory distress. He exhibits no tenderness.  Abdominal: Soft. Normal appearance and bowel sounds are normal. There is no hepatosplenomegaly. There is no tenderness. There is no rebound, no guarding, no tenderness at McBurney's point and negative Murphy's sign. No hernia.  Musculoskeletal: Normal range of motion. He exhibits tenderness.  Reproduction of pain with flexion of torso. Negative straight-leg-raise, no decrease in sensation.  Neurological: He is alert and oriented to person, place, and time. He has normal strength. No cranial nerve deficit or sensory deficit. Coordination normal. GCS  eye subscore is 4. GCS verbal subscore is 5. GCS motor subscore is 6.  No saddle anesthesia, no foot drop  Skin: Skin is warm, dry and intact. No rash noted. No cyanosis.  Psychiatric: He has a normal mood and affect. His speech is normal and behavior is normal. Thought content normal.  Nursing note and vitals reviewed.   ED Course  Procedures  DIAGNOSTIC STUDIES: Oxygen Saturation is 98% on RA, normal by my  interpretation.    COORDINATION OF CARE: 11:49 PM - Discussed plans to order medication for muscle inflammation and to write a note for work. Pt advised of plan for treatment and pt agrees.  Labs Review Labs Reviewed - No data to display  Imaging Review No results found. I have personally reviewed and evaluated these images and lab results as part of my medical decision-making.   EKG Interpretation None      MDM   Final diagnoses:  None   lumbar strain  Patient presents to the ER with musculoskeletal back pain. Examination reveals back tenderness without any associated neurologic findings. Patient's strength, sensation and reflexes were normal. As such, patient did not require any imaging or further studies. Patient was treated with analgesia.  I personally performed the services described in this documentation, which was scribed in my presence. The recorded information has been reviewed and is accurate.    Gilda Crease, MD 11/11/15 [phone]

## 2015-11-10 NOTE — ED Provider Notes (Deleted)
I have not seen the patient but ask he be changed from an acuity 4 to acuity 3 and moved to the acute side.  Patient seen on 10/16/15 for heroine overdose. He is an IV drug user making his back pain high risk.  Marlon Pel, PA-C 11/10/15 2311

## 2015-11-11 MED ORDER — CYCLOBENZAPRINE HCL 10 MG PO TABS: 10.0000 mg | ORAL_TABLET | Freq: Three times a day (TID) | ORAL | Status: DC | PRN

## 2015-11-11 MED ORDER — PREDNISONE 20 MG PO TABS: 40.0000 mg | ORAL_TABLET | Freq: Every day | ORAL | Status: DC

## 2015-11-11 MED ORDER — ETODOLAC 400 MG PO TABS: 400.0000 mg | ORAL_TABLET | Freq: Two times a day (BID) | ORAL | Status: DC | PRN

## 2015-11-11 NOTE — Discharge Instructions (Signed)

## 2016-02-14 ENCOUNTER — Emergency Department (HOSPITAL_COMMUNITY)
Admission: EM | Admit: 2016-02-14 | Discharge: 2016-02-15 | Disposition: A | Discharge status: Home / Self Care | Payer: Self-pay | Attending: Emergency Medicine | Admitting: Emergency Medicine

## 2016-02-14 ENCOUNTER — Encounter (HOSPITAL_COMMUNITY): Payer: Self-pay | Admitting: Emergency Medicine

## 2016-02-14 DIAGNOSIS — Z7952 Long term (current) use of systemic steroids: Secondary | ICD-10-CM | POA: Insufficient documentation

## 2016-02-14 DIAGNOSIS — Z7982 Long term (current) use of aspirin: Secondary | ICD-10-CM | POA: Insufficient documentation

## 2016-02-14 DIAGNOSIS — F17210 Nicotine dependence, cigarettes, uncomplicated: Secondary | ICD-10-CM | POA: Insufficient documentation

## 2016-02-14 DIAGNOSIS — Z792 Long term (current) use of antibiotics: Secondary | ICD-10-CM | POA: Insufficient documentation

## 2016-02-14 DIAGNOSIS — Z791 Long term (current) use of non-steroidal anti-inflammatories (NSAID): Secondary | ICD-10-CM | POA: Insufficient documentation

## 2016-02-14 DIAGNOSIS — K047 Periapical abscess without sinus: Secondary | ICD-10-CM | POA: Insufficient documentation

## 2016-02-14 MED ORDER — PENICILLIN V POTASSIUM 500 MG PO TABS: 500.0000 mg | ORAL_TABLET | ORAL | Status: DC

## 2016-02-14 MED ORDER — TRAMADOL HCL 50 MG PO TABS: 50.0000 mg | ORAL_TABLET | Freq: Four times a day (QID) | ORAL | Status: DC | PRN

## 2016-02-14 MED ORDER — TRAMADOL HCL 50 MG PO TABS
50.0000 mg | ORAL_TABLET | Freq: Once | ORAL | Status: AC
  Administered 2016-02-14: 50 mg via ORAL
  Filled 2016-02-14: qty 1

## 2016-02-14 MED ORDER — PENICILLIN V POTASSIUM 500 MG PO TABS
500.0000 mg | ORAL_TABLET | ORAL | Status: AC
  Administered 2016-02-14: 500 mg via ORAL
  Filled 2016-02-14: qty 1

## 2016-02-14 NOTE — ED Notes (Signed)
Pt reports dental pain beginning two days ago in the left upper canine tooth.  After awaking from a nap patient noted worsening pain and swelling beside nose and below left eye above tooth.

## 2016-02-14 NOTE — Discharge Instructions (Signed)
Dental Abscess A dental abscess is pus in or around a tooth. HOME CARE  Take medicines only as told by your dentist.  If you were prescribed antibiotic medicine, finish all of it even if you start to feel better.  Rinse your mouth (gargle) often with salt water.  Do not drive or use heavy machinery, like a lawn mower, while taking pain medicine.  Do not apply heat to the outside of your mouth.  Keep all follow-up visits as told by your dentist. This is important. GET HELP IF:  Your pain is worse, and medicine does not help. GET HELP RIGHT AWAY IF:  You have a fever or chills.  Your symptoms suddenly get worse.  You have a very bad headache.  You have problems breathing or swallowing.  You have trouble opening your mouth.  You have puffiness (swelling) in your neck or around your eye.   This information is not intended to replace advice given to you by your health care provider. Make sure you discuss any questions you have with your health care provider.   Document Released: 01/26/2015 Document Reviewed: 01/26/2015 Elsevier Interactive Patient Education [employer]2016 [employer] Inc. Call the dentist first thing in the morning

## 2016-02-14 NOTE — ED Provider Notes (Signed)
CSN: [id number]     Arrival date & time 02/14/16  2249 History  By signing my name below, I, Tanda Rockers, attest that this documentation has been prepared under the direction and in the presence of Earley Favor, NP.  Electronically Signed: Tanda Rockers, ED Scribe. 02/14/2016. 11:18 PM.   Chief Complaint  Patient presents with  . Dental Pain   The history is provided by the patient. No language interpreter was used.    HPI Comments: Duane Price is a 30 y.o. male who presents to the Emergency Department complaining of gradual onset, gradually worsening, constant, left upper dental pain x 2 days. Pt also complains of left sided facial swelling underneath left eye that began this morning. He does not currently have a dentist. Denies fever, chills, pain with movement of left eye, or any other associated symptoms.   History reviewed. No pertinent past medical history. Past Surgical History  Procedure Laterality Date  . Facial surgery     No family history on file. Social History  Substance Use Topics  . Smoking status: Current Every Day Smoker -- 0.50 packs/day    Types: Cigarettes  . Smokeless tobacco: None  . Alcohol Use: No    Review of Systems  Constitutional: Negative for fever and chills.  HENT: Positive for dental problem and facial swelling. Negative for trouble swallowing.   Eyes: Negative for pain and visual disturbance.  Skin: Negative for rash and wound.  Neurological: Negative for headaches.  All other systems reviewed and are negative.  Allergies  Review of patient's allergies indicates no known allergies.  Home Medications   Prior to Admission medications   Medication Sig Start Date End Date Taking? Authorizing Provider  acetaminophen (TYLENOL) 325 MG tablet Take 650 mg by mouth every 6 (six) hours as needed for mild pain, moderate pain or headache.    Historical Provider, MD  Aspirin-Salicylamide-Caffeine (BC HEADACHE POWDER PO) Take 1 each by mouth every 8  (eight) hours as needed (pain).    Historical Provider, MD  cyclobenzaprine (FLEXERIL) 10 MG tablet Take 1 tablet (10 mg total) by mouth 3 (three) times daily as needed for muscle spasms. 11/11/15   Gilda Crease, MD  etodolac (LODINE) 400 MG tablet Take 1 tablet (400 mg total) by mouth 2 (two) times daily as needed for moderate pain. 11/11/15   Gilda Crease, MD  penicillin v potassium (VEETID) 500 MG tablet Take 1 tablet (500 mg total) by mouth now. 02/14/16   Earley Favor, NP  predniSONE (DELTASONE) 20 MG tablet Take 2 tablets (40 mg total) by mouth daily with breakfast. 11/11/15   Gilda Crease, MD  traMADol (ULTRAM) 50 MG tablet Take 1 tablet (50 mg total) by mouth every 6 (six) hours as needed. 02/14/16   Earley Favor, NP   BP 117/66 mmHg  Pulse 74  Temp(Src) 98.4 F (36.9 C) (Oral)  Resp 18  Ht 6' (1.829 m)  Wt 72.576 kg  BMI 21.70 kg/m2  SpO2 99% Physical Exam  Constitutional: He appears well-developed and well-nourished. No distress.  HENT:  Head:    Mouth/Throat:    Eyes: Conjunctivae and EOM are normal. Pupils are equal, round, and reactive to light.  Neck: Normal range of motion.  Cardiovascular: Normal rate.   Pulmonary/Chest: Effort normal.  Musculoskeletal: Normal range of motion.  Lymphadenopathy:    He has no cervical adenopathy.  Neurological: He is alert.  Skin: Skin is warm. He is not diaphoretic.  Nursing note and vitals  reviewed.   ED Course  Procedures (including critical care time) DIAGNOSTIC STUDIES: Oxygen Saturation is 99% on RA, normal by my interpretation.    COORDINATION OF CARE: 11:08 PM-Discussed treatment plan which includes antibiotics, pain management medication, and a follow up with a dentist with pt at bedside and pt agreed to plan.    MDM  Will Rx PCN, Tramadol and refer to DDS in AM  Final diagnoses:  Dental abscess    I personally performed the services described in this documentation, which was scribed in my  presence. The recorded information has been reviewed and is accurate.     Earley FavorGail Kaiya Boatman, NP 02/14/16 2324  Tomasita CrumbleAdeleke Oni, MD 02/15/16 [phone]

## 2016-04-30 ENCOUNTER — Emergency Department (HOSPITAL_COMMUNITY)
Admission: EM | Admit: 2016-04-30 | Discharge: 2016-04-30 | Disposition: A | Discharge status: Home / Self Care | Payer: Self-pay | Attending: Physician Assistant | Admitting: Physician Assistant

## 2016-04-30 ENCOUNTER — Encounter (HOSPITAL_COMMUNITY): Payer: Self-pay | Admitting: *Deleted

## 2016-04-30 DIAGNOSIS — F17210 Nicotine dependence, cigarettes, uncomplicated: Secondary | ICD-10-CM | POA: Insufficient documentation

## 2016-04-30 DIAGNOSIS — Z7982 Long term (current) use of aspirin: Secondary | ICD-10-CM | POA: Insufficient documentation

## 2016-04-30 DIAGNOSIS — L02414 Cutaneous abscess of left upper limb: Secondary | ICD-10-CM | POA: Insufficient documentation

## 2016-04-30 DIAGNOSIS — F1910 Other psychoactive substance abuse, uncomplicated: Secondary | ICD-10-CM | POA: Insufficient documentation

## 2016-04-30 DIAGNOSIS — L03114 Cellulitis of left upper limb: Secondary | ICD-10-CM | POA: Insufficient documentation

## 2016-04-30 MED ORDER — IBUPROFEN 400 MG PO TABS: 400.0000 mg | ORAL_TABLET | Freq: Four times a day (QID) | ORAL | 0 refills | Status: DC | PRN

## 2016-04-30 MED ORDER — DOXYCYCLINE HYCLATE 100 MG PO CAPS: 100.0000 mg | ORAL_CAPSULE | Freq: Two times a day (BID) | ORAL | 0 refills | Status: DC

## 2016-04-30 MED ORDER — HYDROCODONE-ACETAMINOPHEN 5-325 MG PO TABS
2.0000 | ORAL_TABLET | Freq: Once | ORAL | Status: AC
  Administered 2016-04-30: 2 via ORAL
  Filled 2016-04-30: qty 2

## 2016-04-30 MED ORDER — TETANUS-DIPHTH-ACELL PERTUSSIS 5-2.5-18.5 LF-MCG/0.5 IM SUSP
0.5000 mL | Freq: Once | INTRAMUSCULAR | Status: AC
  Administered 2016-04-30: 0.5 mL via INTRAMUSCULAR
  Filled 2016-04-30: qty 0.5

## 2016-04-30 MED ORDER — LIDOCAINE-EPINEPHRINE (PF) 1 %-1:200000 IJ SOLN
20.0000 mL | Freq: Once | INTRAMUSCULAR | Status: AC
  Administered 2016-04-30: 20 mL
  Filled 2016-04-30: qty 30

## 2016-04-30 MED ORDER — DOXYCYCLINE HYCLATE 100 MG PO TABS
100.0000 mg | ORAL_TABLET | Freq: Once | ORAL | Status: AC
  Administered 2016-04-30: 100 mg via ORAL
  Filled 2016-04-30: qty 1

## 2016-04-30 NOTE — ED Provider Notes (Signed)
[facility]-EMERGENCY DEPT Provider Note   CSN: [id number] Arrival date & time: 04/30/16  1122  First Provider Contact:  None       History   Chief Complaint Chief Complaint  Patient presents with  . Arm Pain  . Cellulitis    HPI Delano Metzhomas Maltz is a 30 y.o. male.  HPI. Delano Metzhomas Winfield is a 30 y.o. male with history of polysubstance abuse, here for evaluation of left-sided arm pain. Patient reports on Thursday, he had a lapse in judgment and trying to inject cocaine into his left forearm. He reports the cocaine was crack cocaine mixed with vinegar. He reports gradually worsening pain, redness and swelling to his left elbow. Denies fevers, chills, chest pain, shortness of breath, cough, diaphoresis. Has not tried anything to improve his symptoms. Palpation worsens his discomfort. No other modifying factors.  History reviewed. No pertinent past medical history.  There are no active problems to display for this patient.   Past Surgical History:  Procedure Laterality Date  . facial surgery         Home Medications    Prior to Admission medications   Medication Sig Start Date End Date Taking? Authorizing Provider  acetaminophen (TYLENOL) 325 MG tablet Take 650 mg by mouth every 6 (six) hours as needed for mild pain, moderate pain or headache.    Historical Provider, MD  Aspirin-Salicylamide-Caffeine (BC HEADACHE POWDER PO) Take 1 each by mouth every 8 (eight) hours as needed (pain).    Historical Provider, MD  cyclobenzaprine (FLEXERIL) 10 MG tablet Take 1 tablet (10 mg total) by mouth 3 (three) times daily as needed for muscle spasms. 11/11/15   Gilda Creasehristopher J Pollina, MD  doxycycline (VIBRAMYCIN) 100 MG capsule Take 1 capsule (100 mg total) by mouth 2 (two) times daily. One po bid x 7 days 04/30/16   Joycie PeekBenjamin Addysen Louth, PA-C  etodolac (LODINE) 400 MG tablet Take 1 tablet (400 mg total) by mouth 2 (two) times daily as needed for moderate pain. 11/11/15   Gilda Creasehristopher J Pollina, MD  ibuprofen  (ADVIL,MOTRIN) 400 MG tablet Take 1 tablet (400 mg total) by mouth every 6 (six) hours as needed. 04/30/16   Joycie PeekBenjamin Sim Choquette, PA-C  penicillin v potassium (VEETID) 500 MG tablet Take 1 tablet (500 mg total) by mouth now. 02/14/16   Earley FavorGail Schulz, NP  predniSONE (DELTASONE) 20 MG tablet Take 2 tablets (40 mg total) by mouth daily with breakfast. 11/11/15   Gilda Creasehristopher J Pollina, MD  traMADol (ULTRAM) 50 MG tablet Take 1 tablet (50 mg total) by mouth every 6 (six) hours as needed. 02/14/16   Earley FavorGail Schulz, NP    Family History History reviewed. No pertinent family history.  Social History Social History  Substance Use Topics  . Smoking status: Current Every Day Smoker    Packs/day: 0.50    Types: Cigarettes  . Smokeless tobacco: Never Used  . Alcohol use Yes     Allergies   Review of patient's allergies indicates no known allergies.   Review of Systems Review of Systems A 10 point review of systems was completed and was negative except for pertinent positives and negatives as mentioned in the history of present illness    Physical Exam Updated Vital Signs BP 123/61 (BP Location: Right Arm)   Pulse 72   Temp 98.7 F (37.1 C) (Oral)   Resp 18   Wt 74.8 kg   SpO2 98%   BMI 22.38 kg/m   Physical Exam  Constitutional: He appears well-developed. No distress.  Awake, alert and nontoxic in appearance  HENT:  Head: Normocephalic and atraumatic.  Right Ear: External ear normal.  Left Ear: External ear normal.  Mouth/Throat: Oropharynx is clear and moist.  Eyes: Conjunctivae and EOM are normal. Pupils are equal, round, and reactive to light.  Neck: Normal range of motion. No JVD present.  Cardiovascular: Normal rate, regular rhythm and normal heart sounds.   Pulmonary/Chest: Effort normal and breath sounds normal. No stridor.  Abdominal: Soft. There is no tenderness.  Musculoskeletal:  Area of erythema, induration to medial aspect of left elbow with circumferential area of  fluctuation roughly 3 cm in diameter. Range of motion is decreased secondary to pain. Patient maintains passive range of motion.  Neurological:  Awake, alert, cooperative and aware of situation; motor strength bilaterally; sensation normal to light touch bilaterally; no facial asymmetry; tongue midline; major cranial nerves appear intact;  baseline gait without new ataxia.  Skin: No rash noted. He is not diaphoretic.  Psychiatric: He has a normal mood and affect. His behavior is normal. Thought content normal.  Nursing note and vitals reviewed.    ED Treatments / Results  Labs (all labs ordered are listed, but only abnormal results are displayed) Labs Reviewed - No data to display  EKG  EKG Interpretation None       Radiology No results found.  EMERGENCY DEPARTMENT US SOFT TISSUE INTERPRETATION "Study: Limited Ultrasound of the noted body part in comments below"  INDICATIONS: Soft tissue infection Multiple views of the body part are obtained with a multi-frequency linear probe  PERFORMED BY:  Myself  IMAGES ARCHIVED?: Yes  SIDE:Left  BODY PART:Upper extremity  FINDINGS: Abcess present and Cellulitis present  LIMITATIONS:  Body Habitus  INTERPRETATION:  Abcess present and Cellulitis present  COMMENT:  Abscess left medial AC   Procedures Procedures (including critical care time)  INCISION AND DRAINAGE Performed by: Sharlene Motts Consent: Verbal consent obtained. Risks and benefits: risks, benefits and alternatives were discussed Type: abscess  Body area: Left upper [place]  Anesthesia: local infiltration  Incision was made with a scalpel.  Local anesthetic: lidocaine 1 % with epinephrine  Anesthetic total: 9 ml  Complexity: complex Blunt dissection to break up loculations  Drainage: purulent  Drainage amount: Copious   Packing material: 1/2 in iodoform gauze  Patient tolerance: Patient tolerated the procedure well with no immediate  complications.    Medications Ordered in ED Medications  doxycycline (VIBRA-TABS) tablet 100 mg (not administered)  lidocaine-EPINEPHrine (XYLOCAINE-EPINEPHrine) 1 %-1:200000 (PF) injection 20 mL (20 mLs Infiltration Given 04/30/16 1453)  Tdap (BOOSTRIX) injection 0.5 mL (0.5 mLs Intramuscular Given 04/30/16 1446)  HYDROcodone-acetaminophen (NORCO/VICODIN) 5-325 MG per tablet 2 tablet (2 tablets Oral Given 04/30/16 1453)   Vitals:   04/30/16 1134 04/30/16 1315 04/30/16 1553  BP: 124/70 (!) 100/53 123/61  Pulse: 76 67 72  Resp:  18 18  Temp: 98.5 F (36.9 C)  98.7 F (37.1 C)  TempSrc: Oral  Oral  SpO2: 100% 98% 98%  Weight: 74.8 kg     Meds given in ED:  Medications  doxycycline (VIBRA-TABS) tablet 100 mg (not administered)  lidocaine-EPINEPHrine (XYLOCAINE-EPINEPHrine) 1 %-1:200000 (PF) injection 20 mL (20 mLs Infiltration Given 04/30/16 1453)  Tdap (BOOSTRIX) injection 0.5 mL (0.5 mLs Intramuscular Given 04/30/16 1446)  HYDROcodone-acetaminophen (NORCO/VICODIN) 5-325 MG per tablet 2 tablet (2 tablets Oral Given 04/30/16 1453)    New Prescriptions   DOXYCYCLINE (VIBRAMYCIN) 100 MG CAPSULE    Take 1 capsule (100 mg total) by  mouth 2 (two) times daily. One po bid x 7 days   IBUPROFEN (ADVIL,MOTRIN) 400 MG TABLET    Take 1 tablet (400 mg total) by mouth every 6 (six) hours as needed.     Initial Impression / Assessment and Plan / ED Course  I have reviewed the triage vital signs and the nursing notes.  Pertinent labs & imaging results that were available during my care of the patient were reviewed by me and considered in my medical decision making (see chart for details).  Clinical Course    IV drug user, most recently Thursday (crack cocaine) here for abscess to left medial elbow. No evidence of joint involvement. Abscess confirmed via ultrasound, drained a bedside by myself. Wound culture obtained.  Patient started on doxycycline. Follow-up with her PCP or other medical facility in  3 days for wound recheck and packing removal. Strict return precautions discussed. Patient hemodynamically stable, afebrile and no evidence of systemic infection. Appropriate for discharge and outpatient follow-up. .Prior to patient discharge, I discussed and reviewed this case with Dr. Corlis Leak     Final Clinical Impressions(s) / ED Diagnoses   Final diagnoses:  Abscess of arm, left  Cellulitis of left upper extremity  IV drug abuse    New Prescriptions New Prescriptions   DOXYCYCLINE (VIBRAMYCIN) 100 MG CAPSULE    Take 1 capsule (100 mg total) by mouth 2 (two) times daily. One po bid x 7 days   IBUPROFEN (ADVIL,MOTRIN) 400 MG TABLET    Take 1 tablet (400 mg total) by mouth every 6 (six) hours as needed.     Joycie Peek, PA-C 04/30/16 1648    Courteney Randall An, MD 04/30/16 1810

## 2016-04-30 NOTE — ED Triage Notes (Signed)
Pt has red swollen area around his left arm which started 4 days ago after injecting cocaine mixed with vinegar in his arm. Pt states he missed a vein when trying to inject the cocaine. Pt denies chills or fevers.

## 2016-04-30 NOTE — ED Notes (Signed)
LIDOCAINE AT BEDSIDE

## 2016-04-30 NOTE — Discharge Instructions (Signed)
Please keep your wound clean and dry. Follow-up with your doctor or other medical facility in 3 days for a wound recheck to have the packing removed. Take your antibiotics as prescribed. Take all of your antibiotics, do not save or share them. Take Motrin for discomfort. Return to ED for new or worsening symptoms.

## 2016-05-03 LAB — AEROBIC CULTURE W GRAM STAIN (SUPERFICIAL SPECIMEN)

## 2016-05-04 ENCOUNTER — Telehealth (HOSPITAL_BASED_OUTPATIENT_CLINIC_OR_DEPARTMENT_OTHER): Payer: Self-pay

## 2016-05-04 NOTE — Telephone Encounter (Signed)
Post ED Visit - Positive Culture Follow-up  Culture report reviewed by antimicrobial stewardship pharmacist:  []  Enzo BiNathan Batchelder, Pharm.D. []  Celedonio MiyamotoJeremy Frens, Pharm.D., BCPS []  Garvin FilaMike Maccia, Pharm.D. []  Georgina PillionElizabeth Martin, Pharm.D., BCPS []  MomenceMinh Pham, [address]Pharm[address]D., BCPS, AAHIVP []  Estella HuskMichelle Turner, Pharm.D., BCPS, AAHIVP []  Tennis Mustassie Stewart, Pharm.D. []  Rob Oswaldo DoneVincent, [address]Pharm[address] Fredonia HighlandMichael Bitonti Pharm D Positive aerobic superficial culture Treated with Doxycycline Hyclate, organism sensitive to the same and no further patient follow-up is required at this time.  Jerry CarasCullom, Lilia Letterman Burnett 05/04/2016, 9:53 AM

## 2018-01-06 IMAGING — US US ABDOMEN COMPLETE
1 series · 14 of 25 positions shown · non-contrast
Comparison: None.

CLINICAL DATA: Right upper quadrant abdominal pain.

EXAM:
ABDOMEN ULTRASOUND COMPLETE

[Series 1: us abdomen complete · 0.20mm/px · 14 of 101 slices shown]
[im 1/101]
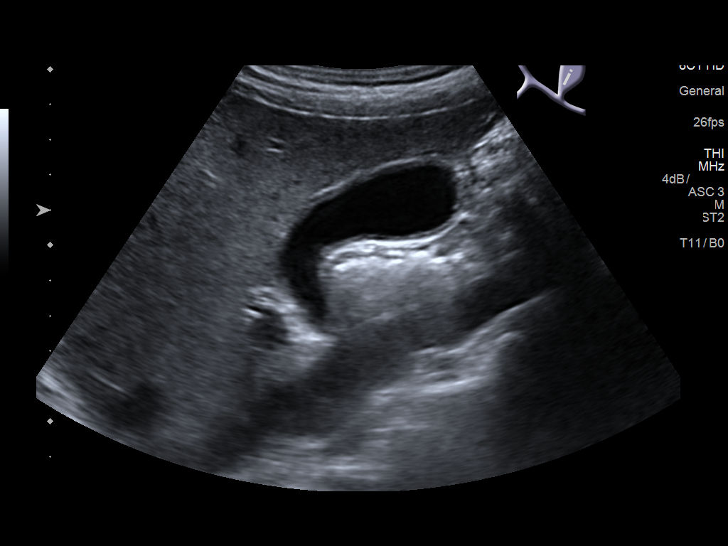
[im 9/101]
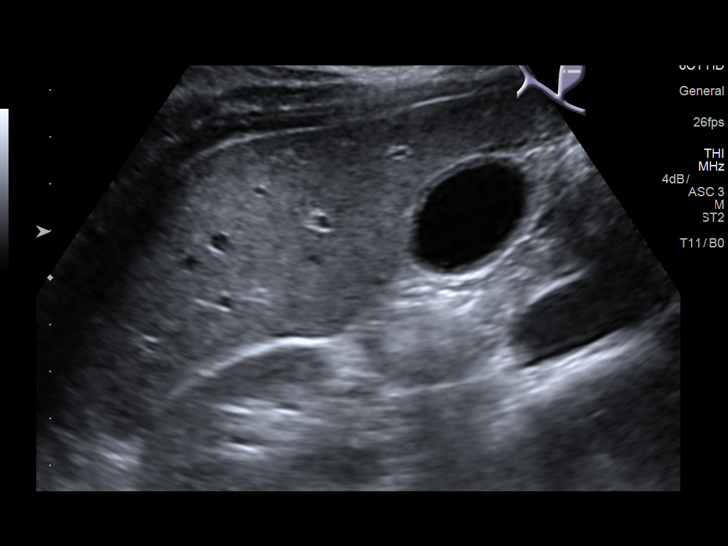
[im 17/101]
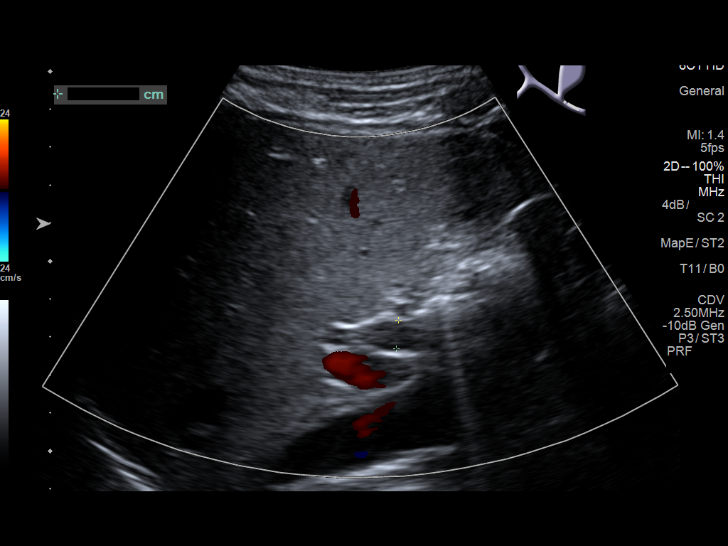
[im 26/101]
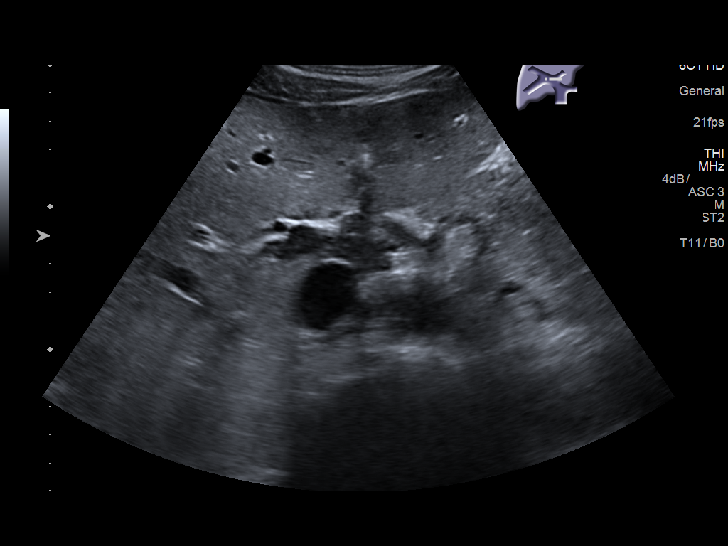
[im 34/101]
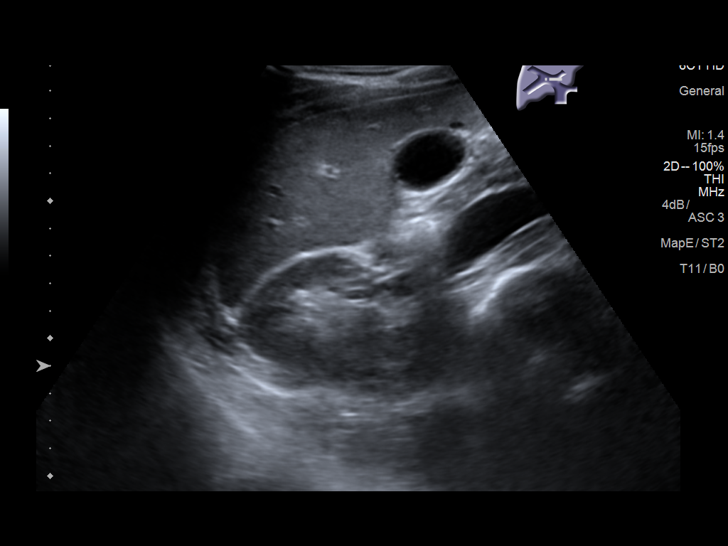
[im 38/101]
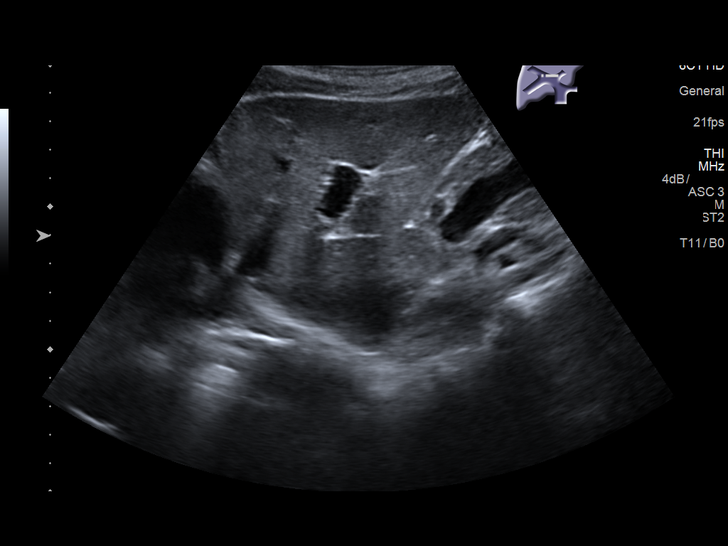
[im 46/101]
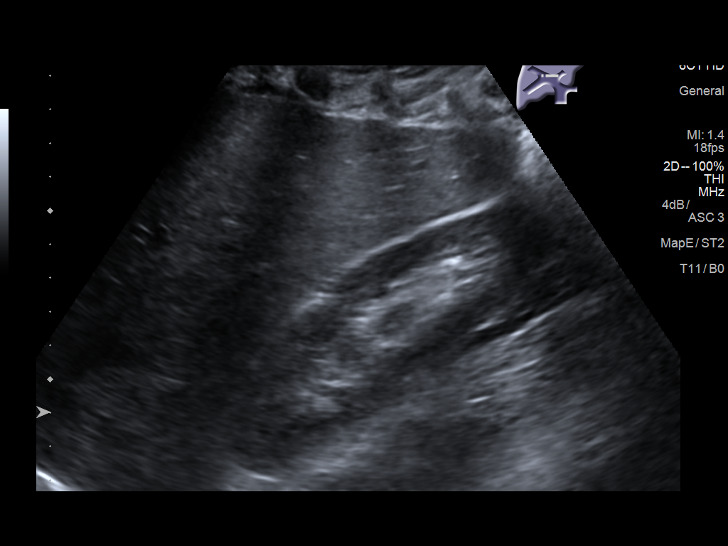
[im 55/101]
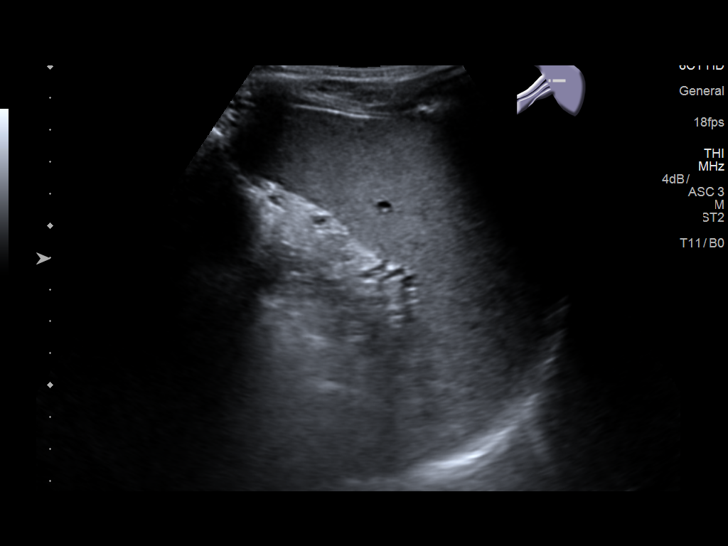
[im 63/101]
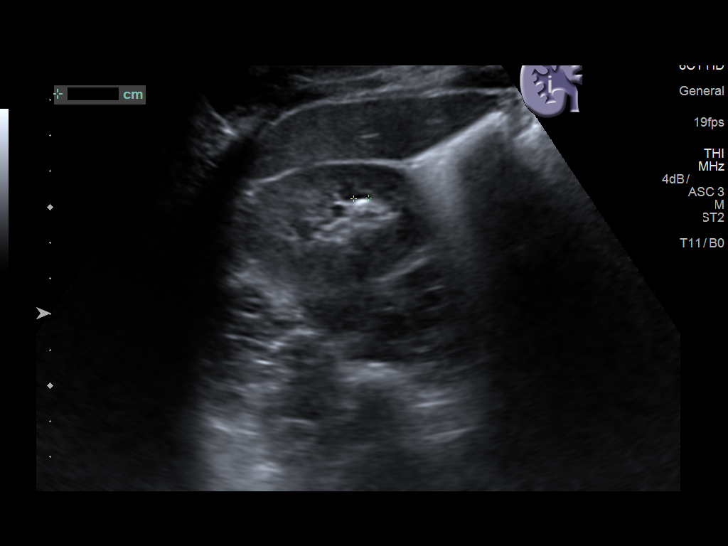
[im 67/101]
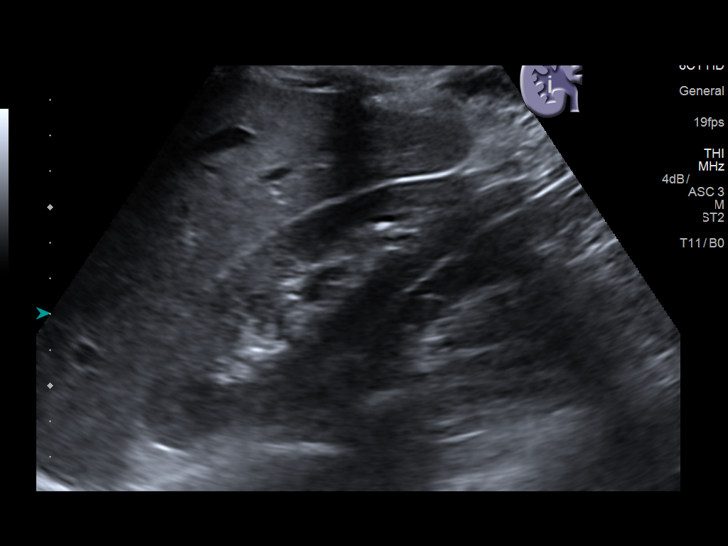
[im 76/101]
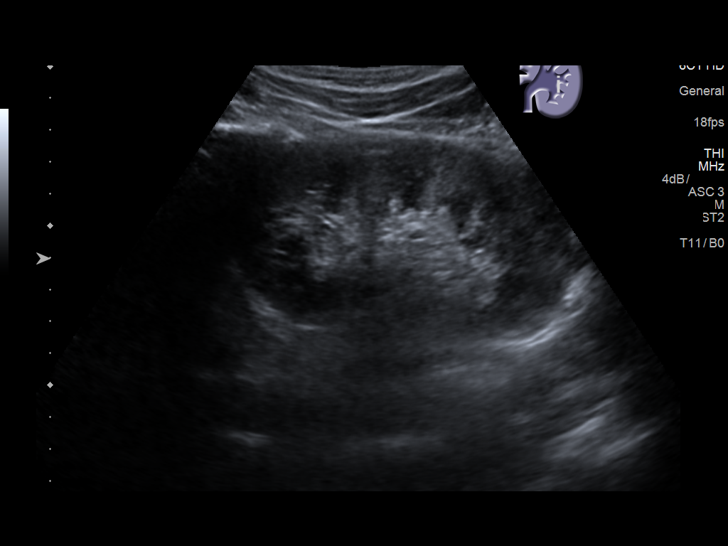
[im 84/101]
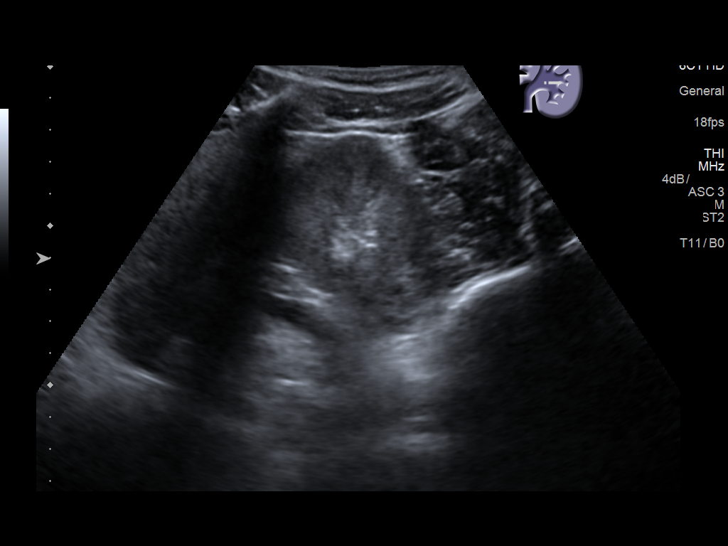
[im 92/101]
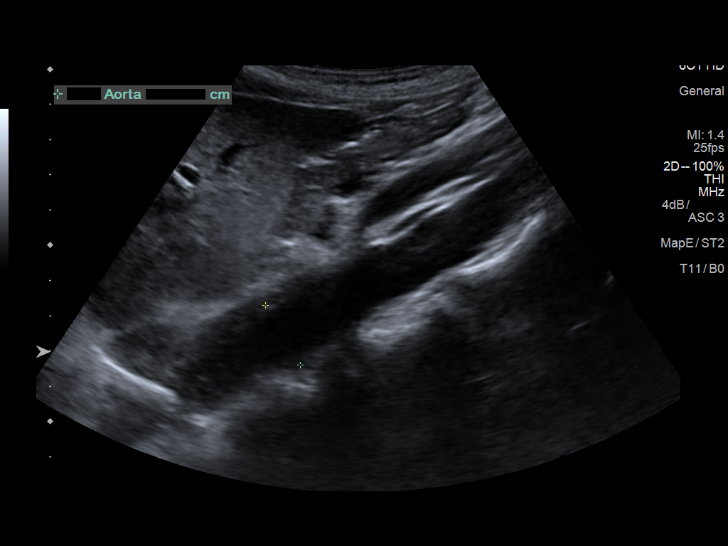
[im 101/101]
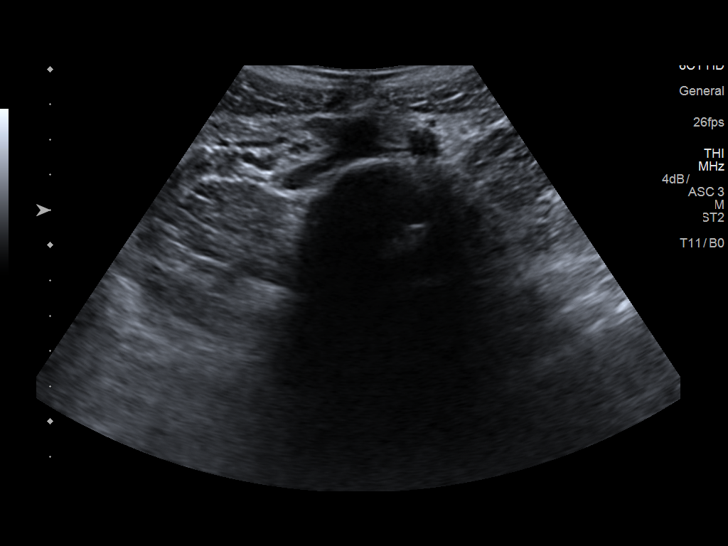

[14 of 25 positions shown; findings below may reference images not displayed]

FINDINGS: Gallbladder: No gallstones or wall thickening visualized. No
sonographic Murphy sign noted by sonographer. Gallbladder wall
thickness is 2.6 mm, within normal limits

Common bile duct: Diameter: 9 mm, mildly enlarged. No obstructing
lesion is evident.

Liver: No focal lesion identified. Within normal limits in
parenchymal echogenicity. Portal vein is patent on color Doppler
imaging with normal direction of blood flow towards the liver.

IVC: No abnormality visualized.

Pancreas: Visualized portion unremarkable.

Spleen: Size and appearance within normal limits.

Right Kidney: Length: 11.1 cm, within normal limits. Echogenicity
within normal limits. No mass or hydronephrosis visualized.

Left Kidney: Length: 11.3 cm, within normal limits. Echogenicity
within normal limits. No mass or hydronephrosis visualized.

Abdominal aorta: No aneurysm visualized.

Other findings: None.
IMPRESSION: 1. Mild dilation of common bile duct. No stone or mass lesion is
present.
2. Otherwise normal abdominal ultrasound.

## 2018-01-15 ENCOUNTER — Emergency Department (HOSPITAL_COMMUNITY): Admission: EM | Admit: 2018-01-15 | Discharge: 2018-01-15 | Discharge status: Against Medical Advice | Payer: Self-pay

## 2018-01-15 NOTE — ED Notes (Signed)
No answer x 2 for triage.

## 2018-01-15 NOTE — ED Notes (Signed)
Attempted to call pt for triage X2 with no answer

## 2018-01-29 ENCOUNTER — Encounter (HOSPITAL_COMMUNITY): Payer: Self-pay

## 2018-01-29 ENCOUNTER — Emergency Department (HOSPITAL_COMMUNITY): Payer: Self-pay

## 2018-01-29 ENCOUNTER — Ambulatory Visit (HOSPITAL_COMMUNITY): Payer: Self-pay

## 2018-01-29 ENCOUNTER — Emergency Department (HOSPITAL_COMMUNITY)
Admission: EM | Admit: 2018-01-29 | Discharge: 2018-01-29 | Disposition: A | Discharge status: Home / Self Care | Payer: Self-pay | Attending: Emergency Medicine | Admitting: Emergency Medicine

## 2018-01-29 ENCOUNTER — Other Ambulatory Visit: Payer: Self-pay

## 2018-01-29 DIAGNOSIS — F1920 Other psychoactive substance dependence, uncomplicated: Secondary | ICD-10-CM | POA: Insufficient documentation

## 2018-01-29 DIAGNOSIS — R109 Unspecified abdominal pain: Secondary | ICD-10-CM | POA: Insufficient documentation

## 2018-01-29 DIAGNOSIS — F1410 Cocaine abuse, uncomplicated: Secondary | ICD-10-CM | POA: Insufficient documentation

## 2018-01-29 DIAGNOSIS — F17210 Nicotine dependence, cigarettes, uncomplicated: Secondary | ICD-10-CM | POA: Insufficient documentation

## 2018-01-29 LAB — CBC
HEMATOCRIT: 47.1 % (ref 39.0–52.0)
HEMOGLOBIN: 16.8 g/dL (ref 13.0–17.0)
MCH: 32.7 pg (ref 26.0–34.0)
MCHC: 35.7 g/dL (ref 30.0–36.0)
MCV: 91.8 fL (ref 78.0–100.0)
PLATELETS: 282 10*3/uL (ref 150–400)
RBC: 5.13 MIL/uL (ref 4.22–5.81)
RDW: 12.2 % (ref 11.5–15.5)
WBC: 10.4 10*3/uL (ref 4.0–10.5)

## 2018-01-29 LAB — URINALYSIS, ROUTINE W REFLEX MICROSCOPIC
BILIRUBIN URINE: NEGATIVE
GLUCOSE, UA: 50 mg/dL — AB
Ketones, ur: NEGATIVE mg/dL
Leukocytes, UA: NEGATIVE
NITRITE: NEGATIVE
Protein, ur: NEGATIVE mg/dL
SPECIFIC GRAVITY, URINE: 1.018 (ref 1.005–1.030)
pH: 6 (ref 5.0–8.0)

## 2018-01-29 LAB — COMPREHENSIVE METABOLIC PANEL
ALBUMIN: 4.5 g/dL (ref 3.5–5.0)
ALK PHOS: 86 U/L (ref 38–126)
ALT: 42 U/L (ref 17–63)
ANION GAP: 8 (ref 5–15)
AST: 27 U/L (ref 15–41)
BILIRUBIN TOTAL: 0.6 mg/dL (ref 0.3–1.2)
BUN: 6 mg/dL (ref 6–20)
CALCIUM: 9.3 mg/dL (ref 8.9–10.3)
CO2: 26 mmol/L (ref 22–32)
Chloride: 103 mmol/L (ref 101–111)
Creatinine, Ser: 0.94 mg/dL (ref 0.61–1.24)
GFR calc non Af Amer: >60 mL/min (ref >60)
GLUCOSE: 124 mg/dL — AB (ref 65–99)
POTASSIUM: 4 mmol/L (ref 3.5–5.1)
SODIUM: 137 mmol/L (ref 135–145)
TOTAL PROTEIN: 7.9 g/dL (ref 6.5–8.1)

## 2018-01-29 LAB — LIPASE, BLOOD: Lipase: 25 U/L (ref 11–51)

## 2018-01-29 NOTE — Discharge Instructions (Addendum)
Please read attached information. If you experience any new or worsening signs or symptoms please return to the emergency room for evaluation. Please follow-up with your primary care provider or specialist as discussed.  °

## 2018-01-29 NOTE — ED Provider Notes (Signed)
Patient placed in Quick Look pathway, seen and evaluated   Chief Complaint: Abdominal pain  HPI: Patient with history of hepatitis C, hernia surgery presents with complaint of waxing and waning right upper quadrant and flank pain.  Symptoms have been ongoing for days.  He is very concerned that he is having complications related to his hepatitis.  States that he had a work-up while in jail which was negative.  Patient denies fevers, vomiting, diarrhea.  ROS: + abdominal pain, - N/V  Physical Exam:   Gen: No distress  Neuro: Awake and Alert  Skin: Warm    Focused Exam: Heart RRR, nml S1,S2, no m/r/g; Lungs CTAB; Abd soft, minimal RUQ pain, no rebound or guarding; Ext 2+ pedal pulses bilaterally, no edema; Psych anxious.  BP 118/68 (BP Location: Right Arm)   Pulse 91   Temp 98.3 F (36.8 C) (Oral)   Resp 18   SpO2 97%    Will evaluate patient for abdominal pain and right upper quadrant pain.  A complete abdominal ultrasound ordered.  Discussed with patient that will give [place] the best view of the gallbladder and liver.   Initiation of care has begun. The patient has been counseled on the process, plan, and necessity for staying for the completion/evaluation, and the remainder of the medical screening examination    Renne Crigler, PA-C 01/29/18 1338    Charlynne Pander, MD 01/29/18 [phone]

## 2018-01-29 NOTE — ED Provider Notes (Signed)
[facility] EMERGENCY DEPARTMENT Provider Note   CSN: [id number] Arrival date & time: 01/29/18  1229     History   Chief Complaint Chief Complaint  Patient presents with  . Abdominal Pain    HPI Duane Price is a 32 y.o. male.  HPI   32 year old male presents today with complaints of abdominal pain.  Patient notes a several month history of vague right-sided flank and abdominal pain.  He notes symptoms have persisted, no worsening or radiation of symptoms.  Patient denies any chest pain shortness of breath, denies any nausea vomiting fever chills, denies any abdominal tenderness, dysuria, urinary changes, or changes in his bowel habits or characteristics.  3, reports taking Tylenol with no significant improvement in symptoms.   History reviewed. No pertinent past medical history.  There are no active problems to display for this patient.   Past Surgical History:  Procedure Laterality Date  . facial surgery          Home Medications    Prior to Admission medications   Medication Sig Start Date End Date Taking? Authorizing Provider  acetaminophen (TYLENOL) 325 MG tablet Take 650 mg by mouth every 6 (six) hours as needed (for pain or headaches).    Yes [provider]  ibuprofen (ADVIL,MOTRIN) 200 MG tablet Take 400 mg by mouth every 6 (six) hours as needed (for pain or headaches).   Yes [provider]  cyclobenzaprine (FLEXERIL) 10 MG tablet Take 1 tablet (10 mg total) by mouth 3 (three) times daily as needed for muscle spasms. Patient not taking: Reported on 01/29/2018 11/11/15   Gilda Crease, MD  doxycycline (VIBRAMYCIN) 100 MG capsule Take 1 capsule (100 mg total) by mouth 2 (two) times daily. One po bid x 7 days Patient not taking: Reported on 01/29/2018 04/30/16   Joycie Peek, PA-C  etodolac (LODINE) 400 MG tablet Take 1 tablet (400 mg total) by mouth 2 (two) times daily as needed for moderate pain. Patient not taking:  Reported on 01/29/2018 11/11/15   Gilda Crease, MD  ibuprofen (ADVIL,MOTRIN) 400 MG tablet Take 1 tablet (400 mg total) by mouth every 6 (six) hours as needed. Patient not taking: Reported on 01/29/2018 04/30/16   Joycie Peek, PA-C  penicillin v potassium (VEETID) 500 MG tablet Take 1 tablet (500 mg total) by mouth now. Patient not taking: Reported on 01/29/2018 02/14/16   Earley Favor, NP  predniSONE (DELTASONE) 20 MG tablet Take 2 tablets (40 mg total) by mouth daily with breakfast. Patient not taking: Reported on 01/29/2018 11/11/15   Gilda Crease, MD  traMADol (ULTRAM) 50 MG tablet Take 1 tablet (50 mg total) by mouth every 6 (six) hours as needed. Patient not taking: Reported on 01/29/2018 02/14/16   Earley Favor, NP    Family History History reviewed. No pertinent family history.  Social History Social History   Tobacco Use  . Smoking status: Current Every Day Smoker    Packs/day: 0.50    Types: Cigarettes  . Smokeless tobacco: Never Used  Substance Use Topics  . Alcohol use: Yes  . Drug use: Yes    Types: Cocaine, IV     Allergies   Patient has no known allergies.   Review of Systems Review of Systems  All other systems reviewed and are negative.    Physical Exam Updated Vital Signs BP 113/63 (BP Location: Left Arm)   Pulse 80   Temp 98.2 F (36.8 C) (Oral)   Resp 16  SpO2 96%   Physical Exam  Constitutional: He is oriented to person, place, and time. He appears well-developed and well-nourished.  HENT:  Head: Normocephalic and atraumatic.  Eyes: Pupils are equal, round, and reactive to light. Conjunctivae are normal. Right eye exhibits no discharge. Left eye exhibits no discharge. No scleral icterus.  Neck: Normal range of motion. No JVD present. No tracheal deviation present.  Pulmonary/Chest: Effort normal. No stridor.  Abdominal: Soft. He exhibits no distension and no mass. There is no tenderness. There is no rebound and no guarding. No  hernia.  Musculoskeletal:  No CT L-spine tenderness palpation, no tenderness palpation of the right sided musculature or posterior chest wall  Neurological: He is alert and oriented to person, place, and time. Coordination normal.  Psychiatric: He has a normal mood and affect. His behavior is normal. Judgment and thought content normal.  Nursing note and vitals reviewed.    ED Treatments / Results  Labs (all labs ordered are listed, but only abnormal results are displayed) Labs Reviewed  COMPREHENSIVE METABOLIC PANEL - Abnormal; Notable for the following components:      Result Value   Glucose, Bld 124 (*)    All other components within normal limits  URINALYSIS, ROUTINE W REFLEX MICROSCOPIC - Abnormal; Notable for the following components:   Glucose, UA 50 (*)    Hgb urine dipstick SMALL (*)    Bacteria, UA RARE (*)    All other components within normal limits  LIPASE, BLOOD  CBC    EKG None  Radiology US Abdomen Complete  Result Date: 01/29/2018 CLINICAL DATA:  Right upper quadrant abdominal pain. EXAM: ABDOMEN ULTRASOUND COMPLETE COMPARISON:  None. FINDINGS: Gallbladder: No gallstones or wall thickening visualized. No sonographic Murphy sign noted by sonographer. Gallbladder wall thickness is 2.6 mm, within normal limits Common bile duct: Diameter: 9 mm, mildly enlarged. No obstructing lesion is evident. Liver: No focal lesion identified. Within normal limits in parenchymal echogenicity. Portal vein is patent on color Doppler imaging with normal direction of blood flow towards the liver. IVC: No abnormality visualized. Pancreas: Visualized portion unremarkable. Spleen: Size and appearance within normal limits. Right Kidney: Length: 11.1 cm, within normal limits. Echogenicity within normal limits. No mass or hydronephrosis visualized. Left Kidney: Length: 11.3 cm, within normal limits. Echogenicity within normal limits. No mass or hydronephrosis visualized. Abdominal aorta: No  aneurysm visualized. Other findings: None. IMPRESSION: 1. Mild dilation of common bile duct. No stone or mass lesion is present. 2. Otherwise normal abdominal ultrasound. Electronically Signed   By: Marin Roberts M.D.   On: 01/29/2018 17:59    Procedures Procedures (including critical care time)  Medications Ordered in ED Medications - No data to display   Initial Impression / Assessment and Plan / ED Course  I have reviewed the triage vital signs and the nursing notes.  Pertinent labs & imaging results that were available during my care of the patient were reviewed by me and considered in my medical decision making (see chart for details).     Labs: Urinalysis, lipase CMP CBC  Imaging: Complete  Consults:  Therapeutics:  Discharge Meds:   Assessment/Plan: 32 year old male presents today with flank pain.  Uncertain etiology question muscular in nature.  He has had several months of symptoms with no significant change, no signs of infectious etiology on physical or laboratory analysis here.  Ultrasound abdomen with no acute abnormality other than mild ablation of the common bile duct, no signs of obstruction, no LFTs.  Patient  instructed to use anti-inflammatories, follow-up with a primary care provider return immediately with any new or worsening signs or symptoms.  Patient verbalized understanding and agreement to today's plan had no further questions or concerns     Final Clinical Impressions(s) / ED Diagnoses   Final diagnoses:  Flank pain    ED Discharge Orders    None       Rosalio Loud 01/29/18 2150    Tegeler, Canary Brim, MD 01/29/18 [phone]

## 2018-01-29 NOTE — ED Triage Notes (Addendum)
Pt reports he has right side abdominal pain and then he had an episode of chest pain. He states he was recently in jail and had some basic labwork done but feels there is still something more going on. Pt denies n/v/d or fevers.

## 2018-02-01 ENCOUNTER — Encounter (HOSPITAL_COMMUNITY): Payer: Self-pay | Admitting: Emergency Medicine

## 2018-02-01 ENCOUNTER — Emergency Department (HOSPITAL_COMMUNITY)
Admission: EM | Admit: 2018-02-01 | Discharge: 2018-02-02 | Disposition: A | Discharge status: Home / Self Care | Payer: Self-pay | Attending: Emergency Medicine | Admitting: Emergency Medicine

## 2018-02-01 ENCOUNTER — Other Ambulatory Visit: Payer: Self-pay

## 2018-02-01 DIAGNOSIS — K0381 Cracked tooth: Secondary | ICD-10-CM | POA: Insufficient documentation

## 2018-02-01 DIAGNOSIS — K0889 Other specified disorders of teeth and supporting structures: Secondary | ICD-10-CM

## 2018-02-01 DIAGNOSIS — F17210 Nicotine dependence, cigarettes, uncomplicated: Secondary | ICD-10-CM | POA: Insufficient documentation

## 2018-02-01 NOTE — ED Triage Notes (Signed)
Request referral to dentist to have teeth pulled.  C/o toothache right lower side and left upper/lower teeth.

## 2018-02-02 MED ORDER — NAPROXEN 500 MG PO TABS: 500.0000 mg | ORAL_TABLET | Freq: Two times a day (BID) | ORAL | 0 refills | Status: DC

## 2018-02-02 MED ORDER — NAPROXEN 250 MG PO TABS
500.0000 mg | ORAL_TABLET | Freq: Once | ORAL | Status: AC
  Administered 2018-02-02: 500 mg via ORAL
  Filled 2018-02-02: qty 2

## 2018-02-02 MED ORDER — ACETAMINOPHEN 500 MG PO TABS
1000.0000 mg | ORAL_TABLET | Freq: Once | ORAL | Status: AC
  Administered 2018-02-02: 1000 mg via ORAL
  Filled 2018-02-02: qty 2

## 2018-02-02 NOTE — ED Provider Notes (Signed)
[facility] EMERGENCY DEPARTMENT Provider Note   CSN: [id number] Arrival date & time: 02/01/18  2215     History   Chief Complaint Chief Complaint  Patient presents with  . Dental Pain    HPI Duane Price is a 32 y.o. male here for evaluation of ongoing dental pain for several weeks.  States he has bad teeth and has had abscesses before.  Pain is intermittent to both left and right sides, upper and lower gumlines.  Currently pain is most significant and bottom right and bottom left molars where he has known cracked teeth. He is asking for list of dental offices so he can have dental extractions. He denies fevers, chills, facial swelling, trismus. Onset: gradual, intermittent. Severity: moderate. Aggravating factors: palpation, pressure, chewing and exposure to cold foods/drinks.  HPI  History reviewed. No pertinent past medical history.  There are no active problems to display for this patient.   Past Surgical History:  Procedure Laterality Date  . facial surgery          Home Medications    Prior to Admission medications   Medication Sig Start Date End Date Taking? Authorizing Provider  acetaminophen (TYLENOL) 325 MG tablet Take 650 mg by mouth every 6 (six) hours as needed (for pain or headaches).     [provider]  cyclobenzaprine (FLEXERIL) 10 MG tablet Take 1 tablet (10 mg total) by mouth 3 (three) times daily as needed for muscle spasms. Patient not taking: Reported on 01/29/2018 11/11/15   Gilda Crease, MD  doxycycline (VIBRAMYCIN) 100 MG capsule Take 1 capsule (100 mg total) by mouth 2 (two) times daily. One po bid x 7 days Patient not taking: Reported on 01/29/2018 04/30/16   Joycie Peek, PA-C  etodolac (LODINE) 400 MG tablet Take 1 tablet (400 mg total) by mouth 2 (two) times daily as needed for moderate pain. Patient not taking: Reported on 01/29/2018 11/11/15   Gilda Crease, MD  ibuprofen (ADVIL,MOTRIN) 200 MG  tablet Take 400 mg by mouth every 6 (six) hours as needed (for pain or headaches).    [provider]  ibuprofen (ADVIL,MOTRIN) 400 MG tablet Take 1 tablet (400 mg total) by mouth every 6 (six) hours as needed. Patient not taking: Reported on 01/29/2018 04/30/16   Joycie Peek, PA-C  naproxen (NAPROSYN) 500 MG tablet Take 1 tablet (500 mg total) by mouth 2 (two) times daily. 02/02/18   Liberty Handy, PA-C  penicillin v potassium (VEETID) 500 MG tablet Take 1 tablet (500 mg total) by mouth now. Patient not taking: Reported on 01/29/2018 02/14/16   Earley Favor, NP  predniSONE (DELTASONE) 20 MG tablet Take 2 tablets (40 mg total) by mouth daily with breakfast. Patient not taking: Reported on 01/29/2018 11/11/15   Gilda Crease, MD  traMADol (ULTRAM) 50 MG tablet Take 1 tablet (50 mg total) by mouth every 6 (six) hours as needed. Patient not taking: Reported on 01/29/2018 02/14/16   Earley Favor, NP    Family History No family history on file.  Social History Social History   Tobacco Use  . Smoking status: Current Every Day Smoker    Packs/day: 0.50    Types: Cigarettes  . Smokeless tobacco: Never Used  Substance Use Topics  . Alcohol use: Yes  . Drug use: Yes    Types: Cocaine, IV     Allergies   Patient has no known allergies.   Review of Systems Review of Systems  HENT: Positive  for dental problem.   All other systems reviewed and are negative.    Physical Exam Updated Vital Signs BP 106/60 (BP Location: Right Arm)   Pulse (!) 55   Temp 97.8 F (36.6 C) (Oral)   Resp 16   SpO2 97%   Physical Exam  Constitutional: He is oriented to person, place, and time. He appears well-developed and well-nourished. No distress.  NAD.  HENT:  Head: Normocephalic and atraumatic.  Right Ear: External ear normal.  Left Ear: External ear normal.  Nose: Nose normal.  Significant poor dentition with multiple cracked teeth throughout.  No focal gum line tenderness,  erythema, edema, fluctuance, draining. No trismus. MMM. No SL edema or tenderness. Normal phonation. No facial edema.   Eyes: Conjunctivae and EOM are normal. No scleral icterus.  Neck: Normal range of motion. Neck supple.  Cardiovascular: Normal rate, regular rhythm, normal heart sounds and intact distal pulses.  No murmur heard. Pulmonary/Chest: Effort normal and breath sounds normal. He has no wheezes.  Musculoskeletal: Normal range of motion. He exhibits no deformity.  Neurological: He is alert and oriented to person, place, and time.  Skin: Skin is warm and dry. Capillary refill takes less than 2 seconds.  Psychiatric: He has a normal mood and affect. His behavior is normal. Judgment and thought content normal.  Nursing note and vitals reviewed.    ED Treatments / Results  Labs (all labs ordered are listed, but only abnormal results are displayed) Labs Reviewed - No data to display  EKG None  Radiology No results found.  Procedures Procedures (including critical care time)  Medications Ordered in ED Medications  naproxen (NAPROSYN) tablet 500 mg (500 mg Oral Given 02/02/18 0034)  acetaminophen (TYLENOL) tablet 1,000 mg (1,000 mg Oral Given 02/02/18 0034)     Initial Impression / Assessment and Plan / ED Course  I have reviewed the triage vital signs and the nursing notes.  Pertinent labs & imaging results that were available during my care of the patient were reviewed by me and considered in my medical decision making (see chart for details).     32 yo here requesting dental resource list. Reports ongoing, chronic dental issues and multiple cracked teeth.  No changes from chronic dental pain or issues today. No constitutional symptoms of infection or signs of dental or facial abscess/cellulitis.  No indication for emergent lab work, imaging or antibiotics.  Has documented h/o heroin abuse/overdose. Will avoid narcotic pain rx. Will dc with naproxen. Discussed return  precautions.   Final Clinical Impressions(s) / ED Diagnoses   Final diagnoses:  Pain, dental    ED Discharge Orders        Ordered    naproxen (NAPROSYN) 500 MG tablet  2 times daily     02/02/18 0014       Liberty Handy, PA-C 02/02/18 Atlee Abide    Azalia Bilis, MD 02/02/18 1014

## 2018-02-02 NOTE — Discharge Instructions (Signed)
Naproxen 500 mg twice daily for pain. Can add 1000 mg acetaminophen (tylenol) every 6-8 hours for pain.   Return for facial swelling, redness, warmth, pus, fevers, difficulty opening your jaw.

## 2019-02-12 ENCOUNTER — Other Ambulatory Visit: Payer: Self-pay

## 2019-02-12 ENCOUNTER — Emergency Department (HOSPITAL_COMMUNITY)
Admission: EM | Admit: 2019-02-12 | Discharge: 2019-02-12 | Disposition: A | Discharge status: Home / Self Care | Payer: Self-pay | Attending: Emergency Medicine | Admitting: Emergency Medicine

## 2019-02-12 ENCOUNTER — Encounter (HOSPITAL_COMMUNITY): Payer: Self-pay | Admitting: Emergency Medicine

## 2019-02-12 DIAGNOSIS — L0201 Cutaneous abscess of face: Secondary | ICD-10-CM | POA: Insufficient documentation

## 2019-02-12 DIAGNOSIS — F17210 Nicotine dependence, cigarettes, uncomplicated: Secondary | ICD-10-CM | POA: Insufficient documentation

## 2019-02-12 MED ORDER — OXYCODONE-ACETAMINOPHEN 5-325 MG PO TABS
1.0000 | ORAL_TABLET | Freq: Once | ORAL | Status: AC
  Administered 2019-02-12: 19:00:00 1 via ORAL
  Filled 2019-02-12: qty 1

## 2019-02-12 MED ORDER — LIDOCAINE HCL (PF) 1 % IJ SOLN
5.0000 mL | Freq: Once | INTRAMUSCULAR | Status: AC
  Administered 2019-02-12: 5 mL
  Filled 2019-02-12: qty 5

## 2019-02-12 MED ORDER — SULFAMETHOXAZOLE-TRIMETHOPRIM 800-160 MG PO TABS
1.0000 | ORAL_TABLET | Freq: Once | ORAL | Status: AC
  Administered 2019-02-12: 21:00:00 1 via ORAL
  Filled 2019-02-12: qty 1

## 2019-02-12 MED ORDER — SULFAMETHOXAZOLE-TRIMETHOPRIM 800-160 MG PO TABS: 1.0000 | ORAL_TABLET | Freq: Two times a day (BID) | ORAL | 0 refills | Status: AC

## 2019-02-12 NOTE — ED Provider Notes (Signed)
[facility] EMERGENCY DEPARTMENT Provider Note   CSN: [id number] Arrival date & time: 02/12/19  1712    History   Chief Complaint Chief Complaint  Patient presents with  . Abscess    HPI Duane Price is a 33 y.o. male.     HPI   33 year old male presents with abscess to the right lower jaw.  He states symptoms are worsening over the last 2 days.  He notes active discharge of pus.  He denies any dental pain, gum swelling.  He denies any difficulty swallowing or breathing.  He denies any fevers, chills.  Patient states symptoms started as a pimple and he tried to pop it and then the redness progressed.  He denies any difficulty range of motion of his back, pain under the jaw, swelling of the tongue.  History reviewed. No pertinent past medical history.  There are no active problems to display for this patient.   Past Surgical History:  Procedure Laterality Date  . BRAIN SURGERY    . facial surgery          Home Medications    Prior to Admission medications   Medication Sig Start Date End Date Taking? Authorizing Provider  acetaminophen (TYLENOL) 325 MG tablet Take 650 mg by mouth every 6 (six) hours as needed (for pain or headaches).    Yes [provider]    Family History No family history on file.  Social History Social History   Tobacco Use  . Smoking status: Current Every Day Smoker    Packs/day: 0.50    Types: Cigarettes  . Smokeless tobacco: Never Used  Substance Use Topics  . Alcohol use: Yes  . Drug use: Yes    Types: Cocaine, IV     Allergies   Patient has no known allergies.   Review of Systems Review of Systems  Constitutional: Negative for chills and fever.  Respiratory: Negative for shortness of breath.   Cardiovascular: Negative for chest pain.  Gastrointestinal: Negative for abdominal pain, nausea and vomiting.  Skin: Positive for wound.     Physical Exam Updated Vital Signs BP (!) 141/73 (BP  Location: Right Arm)   Pulse (!) 101   Temp 97.9 F (36.6 C) (Oral)   Resp 16   Ht 6' (1.829 m)   Wt 72.6 kg   SpO2 100%   BMI 21.70 kg/m   Physical Exam Vitals signs and nursing note reviewed.  Constitutional:      Appearance: He is well-developed.  HENT:     Head: Normocephalic and atraumatic.  Eyes:     Conjunctiva/sclera: Conjunctivae normal.  Neck:     Musculoskeletal: Normal range of motion and neck supple. No edema, neck rigidity or pain with movement.     Trachea: Trachea normal.  Cardiovascular:     Rate and Rhythm: Normal rate and regular rhythm.     Heart sounds: Normal heart sounds. No murmur.  Pulmonary:     Effort: Pulmonary effort is normal. No respiratory distress.     Breath sounds: Normal breath sounds. No wheezing or rales.  Abdominal:     General: Bowel sounds are normal. There is no distension.     Palpations: Abdomen is soft.     Tenderness: There is no abdominal tenderness.  Musculoskeletal: Normal range of motion.        General: No tenderness or deformity.  Lymphadenopathy:     Cervical: No cervical adenopathy.  Skin:    General: Skin is warm  and dry.     Findings: No erythema or rash.       Neurological:     Mental Status: He is alert and oriented to person, place, and time.  Psychiatric:        Behavior: Behavior normal.      ED Treatments / Results  Labs (all labs ordered are listed, but only abnormal results are displayed) Labs Reviewed - No data to display  EKG None  Radiology No results found.  Procedures .Marland KitchenIncision and Drainage Date/Time: 02/12/2019 8:15 PM Performed by: Clayborne Artist, PA-C Authorized by: Clayborne Artist, PA-C   Consent:    Consent obtained:  Verbal   Consent given by:  Patient   Risks discussed:  Bleeding, incomplete drainage, pain and damage to other organs   Alternatives discussed:  No treatment Universal protocol:    Procedure explained and questions answered to patient or proxy's  satisfaction: yes     Relevant documents present and verified: yes     Test results available and properly labeled: yes     Imaging studies available: yes     Required blood products, implants, devices, and special equipment available: yes     Site/side marked: yes     Immediately prior to procedure a time out was called: yes     Patient identity confirmed:  Verbally with patient Location:    Type:  Abscess   Location:  Head   Head location:  Face Pre-procedure details:    Skin preparation:  Betadine Anesthesia (see MAR for exact dosages):    Anesthesia method:  Local infiltration   Local anesthetic:  Lidocaine 1% w/o epi Procedure type:    Complexity:  Simple Procedure details:    Incision types:  Single straight   Incision depth:  Subcutaneous   Scalpel blade:  11   Wound management:  Irrigated with saline and probed and deloculated   Drainage:  Purulent   Drainage amount:  Scant   Packing materials:  None Post-procedure details:    Patient tolerance of procedure:  Tolerated well, no immediate complications   (including critical care time)  Medications Ordered in ED Medications  oxyCODONE-acetaminophen (PERCOCET/ROXICET) 5-325 MG per tablet 1 tablet (1 tablet Oral Given 02/12/19 1855)  lidocaine (PF) (XYLOCAINE) 1 % injection 5 mL (5 mLs Infiltration Given 02/12/19 1855)     Initial Impression / Assessment and Plan / ED Course  I have reviewed the triage vital signs and the nursing notes.  Pertinent labs & imaging results that were available during my care of the patient were reviewed by me and considered in my medical decision making (see chart for details).        Patient presents with abscess to the right lower jaw.  On physical exam the abscess is approximately 2 x 3 cm with minimal surrounding erythema.  There is active drainage noted from the abscess.  There is no swelling or tenderness under the jaw.  There is no evidence of Ludwig's angina, no swelling under  the tongue.  Patient denies any difficulty swallowing or breathing.  He denies any systemic symptoms, fevers.  Proceeded with I&D after discussion of the risks, benefits and alternatives and patient was agreeable.  Scant amount of discharge was obtained.  Patient will be placed on antibiotics and was given very strict return precautions.  He understands that symptoms may worsen and will return immediately for any new or worsening symptoms.  He is ready and stable for discharge.  Final Clinical  Impressions(s) / ED Diagnoses   Final diagnoses:  None    ED Discharge Orders    None       Rueben BashKendrick, Noemie Devivo S, PA-C 02/12/19 2221    Geoffery Lyonselo, [place], MD 02/17/19 [phone]

## 2019-02-12 NOTE — ED Triage Notes (Signed)
Pt to ED with c/o abscess to right side of face x's 2 days

## 2019-02-12 NOTE — Discharge Instructions (Signed)
Take all Bactrim as prescribed.  Take Tylenol or Motrin as needed for pain.  Clean wound with warm water and soap.  Apply warm compresses to the area 4 times a day.  [facility] in [facility] days for wound recheck.  Return to the ED immediately for new or worsening symptoms or concerns, such as difficulty swallowing, difficulty breathing, fevers, vomiting, increased swelling or any concerns at all.

## 2019-02-20 ENCOUNTER — Emergency Department (HOSPITAL_COMMUNITY)
Admission: EM | Admit: 2019-02-20 | Discharge: 2019-02-20 | Disposition: A | Discharge status: Home / Self Care | Payer: Self-pay | Attending: Emergency Medicine | Admitting: Emergency Medicine

## 2019-02-20 ENCOUNTER — Encounter (HOSPITAL_COMMUNITY): Payer: Self-pay | Admitting: Emergency Medicine

## 2019-02-20 ENCOUNTER — Other Ambulatory Visit: Payer: Self-pay

## 2019-02-20 DIAGNOSIS — F17210 Nicotine dependence, cigarettes, uncomplicated: Secondary | ICD-10-CM | POA: Insufficient documentation

## 2019-02-20 DIAGNOSIS — L0201 Cutaneous abscess of face: Secondary | ICD-10-CM | POA: Insufficient documentation

## 2019-02-20 DIAGNOSIS — F1110 Opioid abuse, uncomplicated: Secondary | ICD-10-CM | POA: Insufficient documentation

## 2019-02-20 MED ORDER — ACETAMINOPHEN 325 MG PO TABS
650.0000 mg | ORAL_TABLET | Freq: Once | ORAL | Status: AC
  Administered 2019-02-20: 650 mg via ORAL
  Filled 2019-02-20: qty 2

## 2019-02-20 MED ORDER — SULFAMETHOXAZOLE-TRIMETHOPRIM 800-160 MG PO TABS
1.0000 | ORAL_TABLET | Freq: Once | ORAL | Status: AC
  Administered 2019-02-20: 1 via ORAL
  Filled 2019-02-20: qty 1

## 2019-02-20 MED ORDER — CEPHALEXIN 500 MG PO CAPS: 500.0000 mg | ORAL_CAPSULE | Freq: Four times a day (QID) | ORAL | 0 refills | Status: AC

## 2019-02-20 MED ORDER — SULFAMETHOXAZOLE-TRIMETHOPRIM 800-160 MG PO TABS: 1.0000 | ORAL_TABLET | Freq: Two times a day (BID) | ORAL | 0 refills | Status: AC

## 2019-02-20 MED ORDER — CEPHALEXIN 250 MG PO CAPS
500.0000 mg | ORAL_CAPSULE | Freq: Once | ORAL | Status: AC
  Administered 2019-02-20: 500 mg via ORAL
  Filled 2019-02-20: qty 2

## 2019-02-20 NOTE — TOC Transition Note (Signed)
Transition of Care [facility]) - CM/SW Discharge Note   Patient Details  Name: Brodric Stach MRN: [id number] Date of Birth: 04/09/1986  [facility]) CM/SW Contact:  Oletta Cohn, RN Phone Number: 02/20/2019, 2:51 PM   Clinical Narrative:    ED CM consulted by [facility] for medication assistance. EDCM reviewed chart and spoke with the pt about [facility] MATCH program ($3 co pay for each Rx through [facility] program, does not include refills, 7 day expiration of MATCH letter and choice of pharmacies). Pt is eligible for [facility] MATCH program (unable to find pt listed in PROCARE per cardholder name inquiry) and has agreed to accept MATCH under terms discussed. PROCARE information entered. MATCH letter completed and provided to pt.  EDCM updated EDP and ED RN.   EDCM also confirmed that pt does not have PCP. NCM discussed and provided written information for uninsured PCP and the importance of PCP for f/u care.    Final next level of care: Homeless Shelter Barriers to Discharge: Barriers Resolved   Patient Goals and CMS Choice Patient states their goals for this hospitalization and ongoing recovery are:: get rid of tooth pain      Discharge Placement                       Discharge Plan and Services In-house Referral: Clinical Social Work Discharge Planning Services: CM Consult            DME Arranged: N/A                    Social Determinants of Health (SDOH) Interventions     Readmission Risk Interventions No flowsheet data found.

## 2019-02-20 NOTE — Progress Notes (Signed)
CSW received social work consult regarding patient needing assistance with getting antibiotics and substance abuse resources. CSW contacted RN CM to have her assist patient in getting with the [facility] program for his medication as well as provide patient with substance abuse resources at bed side.   Geralyn Corwin, LCSW Transitions of Care Department [facility] ED [phone]

## 2019-02-20 NOTE — ED Notes (Signed)
Gauze applied to cheek wound.

## 2019-02-20 NOTE — Discharge Instructions (Addendum)
Please see the information and instructions below regarding your visit.  Your diagnoses today include:  1. Abscess of face     Abscesses form when an infection in your skin starts to collect bacteria and white blood cells, walling it off from the rest of your body to protect you from a bigger infection. Risk factors for this type of infection include:  ?Break in the skin ?Diabetes ?Swollen areas  Sometimes the infection starts to spread to surrounding tissue, causing redness and swelling. We call this cellulitis.   Tests performed today include: See side panel of your discharge paperwork for testing performed today. Vital signs are listed at the bottom of these instructions.   Medications prescribed:    Take any prescribed medications only as prescribed, and any over the counter medications only as directed on the packaging.  1.  You are prescribed Bactrim and Keflex.  Please take Keflex 4 times a day for the next week and Bactrim twice a day for the next week.  Please take all of your antibiotics until finished.   You may develop abdominal discomfort or nausea from the antibiotic. If this occurs, you may take it with food. Some patients also get diarrhea with antibiotics. You may help offset this with probiotics which you can buy or get in yogurt. Do not eat or take the probiotics until 2 hours after your antibiotic. Some women develop vaginal yeast infections after antibiotics. If you develop unusual vaginal discharge after being on this medication, please see your primary care provider.   Some people develop allergies to antibiotics. Symptoms of antibiotic allergy can be mild and include a flat rash and itching. They can also be more serious and include:  ?Hives - Hives are raised, red patches of skin that are usually very itchy.  ?Lip or tongue swelling  ?Trouble swallowing or breathing  ?Blistering of the skin or mouth.  If you have any of these serious symptoms, please seek  emergency medical care immediately.  2.  I recommend Tylenol, 650 mg every 6 hours as needed for pain.  Home care instructions:  Please follow any educational materials contained in this packet.   Some things that may promote healing of your wound and infection include:  Keep the infected area clean and dry. You can take a shower or bath, but be sure to pat the area dry with a towel afterward. Do not put any antibiotic ointments or creams on the area. Reapply a dry gauze dressing any time the bandage has become soaked with drainage, or after cleansing the wound.  Apply warm compresses to the wound 3-4 times daily to encourage drainage.   Return instructions:  Please return to the Emergency Department if you experience worsening symptoms. You should return for reevaluation of your infection if you notice spreading redness, increased swelling, an abscess develops, or you develop signs and symptoms of a systemic illness such as fever and chills.  Please return if you have any other emergent concerns.  Additional Information:   Your vital signs today were: BP 128/81    Pulse 80    Temp 98.1 F (36.7 C) (Oral)    Resp 16    SpO2 99%  If your blood pressure (BP) was elevated on multiple readings during this visit above 130 for the top number or above 80 for the bottom number, please have this repeated by your primary care provider within one month. --------------  Thank you for allowing us to participate in your care  today.

## 2019-02-20 NOTE — ED Triage Notes (Signed)
Per [facility] pt homeless and reports having abscess to right side of face drained 4 days ago. States he is having yellow drainage from it and pain.

## 2019-02-20 NOTE — ED Provider Notes (Signed)
[facility] EMERGENCY DEPARTMENT Provider Note   CSN: [id number] Arrival date & time: 02/20/19  1156    History   Chief Complaint Chief Complaint  Patient presents with  . Abscess    HPI Duane Price is a 33 y.o. male.     HPI  Patient is a 33 yo male with a PMH of IVDU, heroin use presenting for facial pain and abscess of right cheek.  Patient reports that it has returned after it was incised 1 week ago.  Patient reports he cannot afford the antibiotics.  Patient denies any difficulty breathing, swelling, trismus, fever or chills.  Denies any nausea, vomiting, abdominal pain.  Patient reports that his last use of heroin was yesterday.  He is interested in stopping and going to rehab.  History reviewed. No pertinent past medical history.  There are no active problems to display for this patient.   Past Surgical History:  Procedure Laterality Date  . BRAIN SURGERY    . facial surgery          Home Medications    Prior to Admission medications   Medication Sig Start Date End Date Taking? Authorizing Provider  acetaminophen (TYLENOL) 325 MG tablet Take 650 mg by mouth every 6 (six) hours as needed (for pain or headaches).     [provider]  cephALEXin (KEFLEX) 500 MG capsule Take 1 capsule (500 mg total) by mouth 4 (four) times daily for 7 days. 02/20/19 02/27/19  Aviva Kluver B, PA-C  sulfamethoxazole-trimethoprim (BACTRIM DS) 800-160 MG tablet Take 1 tablet by mouth 2 (two) times daily for 7 days. 02/20/19 02/27/19  Elisha Ponder, PA-C    Family History No family history on file.  Social History Social History   Tobacco Use  . Smoking status: Current Every Day Smoker    Packs/day: 0.50    Types: Cigarettes  . Smokeless tobacco: Never Used  Substance Use Topics  . Alcohol use: Yes  . Drug use: Yes    Types: Cocaine, IV     Allergies   Patient has no known allergies.   Review of Systems Review of Systems  Constitutional:  Negative for chills and fever.  Gastrointestinal: Negative for abdominal pain, nausea and vomiting.  Skin: Positive for wound.     Physical Exam Updated Vital Signs BP 128/81   Pulse 80   Temp 98.1 F (36.7 C) (Oral)   Resp 16   SpO2 99%   Physical Exam Vitals signs and nursing note reviewed.  Constitutional:      General: He is not in acute distress.    Appearance: He is well-developed. He is not diaphoretic.     Comments: Sitting comfortably in bed.  HENT:     Head: Normocephalic and atraumatic.     Comments: Patient has a 2.5 cm in diameter area of deroofed scab with serosanguineous drainage.  Mild surrounding swelling but no fluctuance.  No intraoral swelling.  No submandibular tenderness.  Patent posterior pharynx. Eyes:     General:        Right eye: No discharge.        Left eye: No discharge.     Conjunctiva/sclera: Conjunctivae normal.     Comments: EOMs normal to gross examination.  Neck:     Musculoskeletal: Normal range of motion.  Cardiovascular:     Rate and Rhythm: Normal rate and regular rhythm.     Comments: Intact, 2+ radial pulse. Pulmonary:     Comments: Converses comfortably.  No audible wheeze or stridor. Abdominal:     General: There is no distension.  Musculoskeletal: Normal range of motion.  Skin:    General: Skin is warm and dry.  Neurological:     Mental Status: He is alert.     Comments: Cranial nerves intact to gross observation. Patient moves extremities without difficulty.  Psychiatric:        Behavior: Behavior normal.        Thought Content: Thought content normal.        Judgment: Judgment normal.      ED Treatments / Results  Labs (all labs ordered are listed, but only abnormal results are displayed) Labs Reviewed - No data to display  EKG None  Radiology No results found.  Procedures Procedures (including critical care time)  Medications Ordered in ED Medications  sulfamethoxazole-trimethoprim (BACTRIM DS)  800-160 MG per tablet 1 tablet (1 tablet Oral Given 02/20/19 1326)  cephALEXin (KEFLEX) capsule 500 mg (500 mg Oral Given 02/20/19 1326)     Initial Impression / Assessment and Plan / ED Course  I have reviewed the triage vital signs and the nursing notes.  Pertinent labs & imaging results that were available during my care of the patient were reviewed by me and considered in my medical decision making (see chart for details).        This is a well-appearing 33 year old male, afebrile, normotensive and in no acute distress presenting for ongoing abscess of right side of his face.  Patient does admit to IVDU and trying to inject in his neck.  He has no signs of SIRS or sepsis today, doubt systemic infection.  This area was successfully I&D 1 week ago however it appears that the swelling persist.  There appears to be no evidence of further fluctuance to drain.  It is actively draining and amenable to warm compresses.  He was not able to obtain antibiotics due to lack of funds.  Case management was consulted who [facility] and gave him resources for detox.  He was given return precautions for any increasing swelling, pain, or erythema.  Patient is in understanding and agrees with plan of care.  Final Clinical Impressions(s) / ED Diagnoses   Final diagnoses:  Abscess of face    ED Discharge Orders         Ordered    sulfamethoxazole-trimethoprim (BACTRIM DS) 800-160 MG tablet  2 times daily     02/20/19 1417    cephALEXin (KEFLEX) 500 MG capsule  4 times daily     02/20/19 1417           Delia ChimesMurray,  B, PA-C 02/20/19 1512    Jacalyn LefevreHaviland, Julie, MD 02/20/19 [phone]

## 2019-02-20 NOTE — ED Notes (Signed)
Discharge instructions and prescriptions discussed with Pt. Pt verbalized understanding. Pt stable and ambulatory.   

## 2019-03-05 ENCOUNTER — Other Ambulatory Visit: Payer: Self-pay

## 2019-03-05 ENCOUNTER — Emergency Department (HOSPITAL_COMMUNITY)
Admission: EM | Admit: 2019-03-05 | Discharge: 2019-03-05 | Disposition: A | Discharge status: Home / Self Care | Payer: Self-pay | Attending: Emergency Medicine | Admitting: Emergency Medicine

## 2019-03-05 ENCOUNTER — Emergency Department (HOSPITAL_COMMUNITY)
Admission: EM | Admit: 2019-03-05 | Discharge: 2019-03-06 | Disposition: A | Discharge status: Home / Self Care | Payer: Self-pay | Attending: Emergency Medicine | Admitting: Emergency Medicine

## 2019-03-05 ENCOUNTER — Encounter (HOSPITAL_COMMUNITY): Payer: Self-pay | Admitting: Obstetrics and Gynecology

## 2019-03-05 DIAGNOSIS — F17210 Nicotine dependence, cigarettes, uncomplicated: Secondary | ICD-10-CM | POA: Insufficient documentation

## 2019-03-05 DIAGNOSIS — T401X1A Poisoning by heroin, accidental (unintentional), initial encounter: Secondary | ICD-10-CM | POA: Insufficient documentation

## 2019-03-05 DIAGNOSIS — F1920 Other psychoactive substance dependence, uncomplicated: Secondary | ICD-10-CM | POA: Insufficient documentation

## 2019-03-05 LAB — CBG MONITORING, ED: Glucose-Capillary: 108 mg/dL — ABNORMAL HIGH (ref 70–99)

## 2019-03-05 MED ORDER — ONDANSETRON HCL 4 MG/2ML IJ SOLN: 4.0000 mg | Freq: Once | INTRAMUSCULAR | Status: DC

## 2019-03-05 MED ORDER — ONDANSETRON 4 MG PO TBDP
4.0000 mg | ORAL_TABLET | Freq: Once | ORAL | Status: AC
  Administered 2019-03-05: 4 mg via ORAL
  Filled 2019-03-05: qty 1

## 2019-03-05 MED ORDER — NALOXONE HCL 4 MG/0.1ML NA LIQD
1.0000 | Freq: Once | NASAL | Status: AC
Start: 2019-03-05 — End: 2019-03-05
  Administered 2019-03-05: 1 via NASAL
  Filled 2019-03-05: qty 4
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
## 2019-03-05 NOTE — ED Provider Notes (Signed)
[place] DEPT Provider Note   CSN: [id number] Arrival date & time: 03/05/19  2040    History   Chief Complaint Chief Complaint  Patient presents with  . Drug Overdose    HPI Duane Price is a 33 y.o. male.     33 yo M with a chief complaint of unintentional heroin overdose.  This is the patient's second visit in 48 hours.  Patient denies any complaints continues to breathe normally on his own and not hypoxic.  Level 5 caveat opiate intoxication.  The history is provided by the patient.  Drug Overdose  This is a recurrent problem. The current episode started 1 to 2 hours ago. The problem occurs constantly. The problem has not changed since onset.Pertinent negatives include no chest pain, no abdominal pain, no headaches and no shortness of breath. Nothing aggravates the symptoms. Nothing relieves the symptoms. He has tried nothing for the symptoms. The treatment provided no relief.    No past medical history on file.  There are no active problems to display for this patient.   Past Surgical History:  Procedure Laterality Date  . BRAIN SURGERY    . facial surgery          Home Medications    Prior to Admission medications   Medication Sig Start Date End Date Taking? Authorizing Provider  acetaminophen (TYLENOL) 325 MG tablet Take 650 mg by mouth every 6 (six) hours as needed (for pain or headaches).     [provider]    Family History No family history on file.  Social History Social History   Tobacco Use  . Smoking status: Current Every Day Smoker    Packs/day: 0.50    Types: Cigarettes  . Smokeless tobacco: Never Used  Substance Use Topics  . Alcohol use: Yes  . Drug use: Yes    Types: Cocaine, IV    Comment: Heroin     Allergies   Patient has no known allergies.   Review of Systems Review of Systems  Constitutional: Negative for chills and fever.  HENT: Negative for congestion and facial swelling.    Eyes: Negative for discharge and visual disturbance.  Respiratory: Negative for shortness of breath.   Cardiovascular: Negative for chest pain and palpitations.  Gastrointestinal: Negative for abdominal pain, diarrhea and vomiting.  Musculoskeletal: Negative for arthralgias and myalgias.  Skin: Negative for color change and rash.  Neurological: Negative for tremors, syncope and headaches.  Psychiatric/Behavioral: Negative for confusion and dysphoric mood.     Physical Exam Updated Vital Signs BP (!) 116/59 (BP Location: Left Arm)   Pulse 78   Temp 97.9 F (36.6 C) (Oral)   Resp (!) 22   Ht 6' (1.829 m)   Wt 79 kg   SpO2 98%   BMI 23.62 kg/m   Physical Exam Vitals signs and nursing note reviewed.  Constitutional:      Appearance: He is well-developed.  HENT:     Head: Normocephalic and atraumatic.  Eyes:     Pupils: Pupils are equal, round, and reactive to light.  Neck:     Musculoskeletal: Normal range of motion and neck supple.     Vascular: No JVD.  Cardiovascular:     Rate and Rhythm: Normal rate and regular rhythm.     Heart sounds: No murmur. No friction rub. No gallop.   Pulmonary:     Effort: No respiratory distress.     Breath sounds: No wheezing.  Abdominal:  General: There is no distension.     Tenderness: There is no guarding or rebound.  Musculoskeletal: Normal range of motion.  Skin:    Coloration: Skin is not pale.     Findings: No rash.  Neurological:     Mental Status: He is alert.     Comments: Sleepy, easily arousable.   Psychiatric:        Behavior: Behavior normal.      ED Treatments / Results  Labs (all labs ordered are listed, but only abnormal results are displayed) Labs Reviewed  CBG MONITORING, ED - Abnormal; Notable for the following components:      Result Value   Glucose-Capillary 108 (*)    All other components within normal limits    EKG None  Radiology No results found.  Procedures Procedures (including  critical care time)  Medications Ordered in ED Medications  naloxone (NARCAN) nasal spray 4 mg/0.1 mL (1 spray Nasal Provided for home use 03/05/19 2158)     Initial Impression / Assessment and Plan / ED Course  I have reviewed the triage vital signs and the nursing notes.  Pertinent labs & imaging results that were available during my care of the patient were reviewed by me and considered in my medical decision making (see chart for details).        33 yo M with a chief complaint of an unintentional heroin overdose.  Patient is fairly sleepy on my initial exam.  Will observe in the ED until clinically sober.  The patients results and plan were reviewed and discussed.   Any x-rays performed were independently reviewed by myself.   Differential diagnosis were considered with the presenting HPI.  Medications  naloxone (NARCAN) nasal spray 4 mg/0.1 mL (1 spray Nasal Provided for home use 03/05/19 2158)    Vitals:   03/05/19 2047 03/05/19 2053  BP: (!) 116/59   Pulse: 78   Resp: (!) 22   Temp: 97.9 F (36.6 C)   TempSrc: Oral   SpO2: 98%   Weight:  79 kg  Height:  6' (1.829 m)    Final diagnoses:  Accidental overdose of heroin, initial encounter [facility](HCC)    Admission/ observation were discussed with the admitting physician, patient and/or family and they are comfortable with the plan.     Final Clinical Impressions(s) / ED Diagnoses   Final diagnoses:  Accidental overdose of heroin, initial encounter [facility](HCC)    ED Discharge Orders    None       Melene PlanFloyd, Jennavieve Arrick, DO 03/05/19 2207

## 2019-03-05 NOTE — ED Provider Notes (Signed)
[place] DEPT Provider Note   CSN: [id number] Arrival date & time: 03/05/19  0802    History   Chief Complaint Chief Complaint  Patient presents with  . Addiction Problem    HPI Duane Price is a 33 y.o. male.     33 year old male presents after he was found with altered mental status.  Patient is to be a heroin addict.  EMS was called because he was found sitting outside on the sidewalk.  He was arousable to just minimal stimulation.  Did not receive Narcan.  CBG was appropriate.  He currently denies any chest or abdominal discomfort.  Denies any headache.  He is alert and oriented x4.  No SI or HI.     No past medical history on file.  There are no active problems to display for this patient.   Past Surgical History:  Procedure Laterality Date  . BRAIN SURGERY    . facial surgery          Home Medications    Prior to Admission medications   Medication Sig Start Date End Date Taking? Authorizing Provider  acetaminophen (TYLENOL) 325 MG tablet Take 650 mg by mouth every 6 (six) hours as needed (for pain or headaches).     [provider]    Family History No family history on file.  Social History Social History   Tobacco Use  . Smoking status: Current Every Day Smoker    Packs/day: 0.50    Types: Cigarettes  . Smokeless tobacco: Never Used  Substance Use Topics  . Alcohol use: Yes  . Drug use: Yes    Types: Cocaine, IV    Comment: Heroin     Allergies   Patient has no known allergies.   Review of Systems Review of Systems  All other systems reviewed and are negative.    Physical Exam Updated Vital Signs BP 112/82 (BP Location: Left Arm)   Pulse 80   Temp (!) 97.5 F (36.4 C) (Oral)   Resp 15   Ht 1.829 m (6')   Wt 73 kg   SpO2 100%   BMI 21.83 kg/m   Physical Exam Vitals signs and nursing note reviewed.  Constitutional:      General: He is not in acute distress.    Appearance:  Normal appearance. He is well-developed. He is not toxic-appearing.  HENT:     Head: Normocephalic and atraumatic.  Eyes:     General: Lids are normal.     Conjunctiva/sclera: Conjunctivae normal.     Pupils: Pupils are equal, round, and reactive to light.  Neck:     Musculoskeletal: Normal range of motion and neck supple.     Thyroid: No thyroid mass.     Trachea: No tracheal deviation.  Cardiovascular:     Rate and Rhythm: Normal rate and regular rhythm.     Heart sounds: Normal heart sounds. No murmur. No gallop.   Pulmonary:     Effort: Pulmonary effort is normal. No respiratory distress.     Breath sounds: Normal breath sounds. No stridor. No decreased breath sounds, wheezing, rhonchi or rales.  Abdominal:     General: Bowel sounds are normal. There is no distension.     Palpations: Abdomen is soft.     Tenderness: There is no abdominal tenderness. There is no rebound.  Musculoskeletal: Normal range of motion.        General: No tenderness.       Back:  Skin:  General: Skin is warm and dry.     Findings: No abrasion or rash.  Neurological:     Mental Status: He is alert and oriented to person, place, and time.     GCS: GCS eye subscore is 4. GCS verbal subscore is 5. GCS motor subscore is 6.     Cranial Nerves: No cranial nerve deficit.     Sensory: No sensory deficit.  Psychiatric:        Attention and Perception: Attention normal.        Mood and Affect: Affect is flat.        Speech: Speech normal.        Behavior: Behavior normal.        Thought Content: Thought content does not include homicidal or suicidal plan.      ED Treatments / Results  Labs (all labs ordered are listed, but only abnormal results are displayed) Labs Reviewed - No data to display  EKG None  Radiology No results found.  Procedures Procedures (including critical care time)  Medications Ordered in ED Medications - No data to display   Initial Impression / Assessment and Plan  / ED Course  I have reviewed the triage vital signs and the nursing notes.  Pertinent labs & imaging results that were available during my care of the patient were reviewed by me and considered in my medical decision making (see chart for details).        Patient given a meal to eat here and stable for discharge.  Final Clinical Impressions(s) / ED Diagnoses   Final diagnoses:  None    ED Discharge Orders    None       Lorre NickAllen, Yanuel Tagg, MD 03/05/19 [phone]

## 2019-03-05 NOTE — ED Notes (Signed)
Bed: MM76 Expected date:  Expected time:  Means of arrival:  Comments: 33 yr old overdose

## 2019-03-05 NOTE — ED Provider Notes (Signed)
Patient care assumed from Dr. Tyrone Nine at shift change with plan to follow-up on reassessment.  Briefly patient is a known IV drug user and uses heroin.  Today he had an unintentional heroin overdose and was found intoxicated outside of a hotel.  Patient given a dose of Narcan in the ED.  He will need to be reassessed to decide if he is safe for discharge.  Physical Exam  BP 117/64   Pulse 84   Temp 97.9 F (36.6 C) (Oral)   Resp 16   Ht 6' (1.829 m)   Wt 79 kg   SpO2 99%   BMI 23.62 kg/m   Physical Exam Vitals signs and nursing note reviewed.  Constitutional:      General: He is not in acute distress.    Appearance: He is well-developed.     Comments: Pt sleeping soundly, easily arousable to voice. Ambulatory down the hall  Eyes:     Conjunctiva/sclera: Conjunctivae normal.  Cardiovascular:     Rate and Rhythm: Normal rate and regular rhythm.  Pulmonary:     Effort: Pulmonary effort is normal.     Breath sounds: Normal breath sounds.  Skin:    General: Skin is warm and dry.  Neurological:     Mental Status: He is alert and oriented to person, place, and time.     ED Course/Procedures     Procedures  Results for orders placed or performed during the hospital encounter of 03/05/19  CBG monitoring, ED  Result Value Ref Range   Glucose-Capillary 108 (H) 70 - 99 mg/dL   No results found.   MDM   33 year old male presenting for evaluation of heroin overdose prior to arrival.  Has history of heroin abuse.  Admits to using today.  Denies suicidal intent.  He was given Narcan in the ED.  He was monitored for a period of several hours and on reassessment he is sleeping soundly but is easily arousable to voice.  He is protecting his airway.  I feel that he is appropriate for discharge.  Will give Narcan home kit and resources for counseling.  Advised return to the ER for new or worsening symptoms.  He voices understanding and is in agreement with plan all questions  answered.      Rodney Booze, PA-C 03/06/19 0258    Shanon Rosser, MD 03/06/19 [phone]

## 2019-03-05 NOTE — ED Notes (Signed)
Bed: JQ96 Expected date:  Expected time:  Means of arrival:  Comments: EMS/Heroin use

## 2019-03-05 NOTE — Discharge Instructions (Addendum)
There is help if you need it.  Please do not use dirty needles, this could cause you a severe infection to your skin, heart or spinal cord.  This could kill you or leave you permanently disabled.    [place] [facility]ioid [facility] ([facility]) Fixed; mobile; peer-based [place] [place] [phone] [email or web address] Fixed site exchange at [facility], Wednesdays (2-5pm) and Thursdays (3-8pm). [place] [place], [place] [phone] Call or text to arrange mobile and peer exchange, Mondays (1-4pm) and Fridays (4-7pm). [place] There is been research done recently that shows that your risk of dying within the next year if you show up to the ED for an unintentional heroin overdose is somewhere between 10 and 15%.  This is much higher than just about every medical condition that we assess for.  Please consider quitting if that something that you were willing to try.

## 2019-03-05 NOTE — ED Triage Notes (Signed)
Per EMS Pt was found outside confused and woke up with EMS. PT reports he has back pain and reports heroin use. PT reports drug use this morning at unknown time.  Pt's VSS.

## 2019-03-05 NOTE — ED Notes (Signed)
Patient given a TV dinner tray with [place], stuffing, apples, and green beans. Pt is asking for desserts and a yogurt which are not available in the nourishment area. RN made patient aware.

## 2019-03-05 NOTE — ED Triage Notes (Signed)
Per EMS, Pt was found laying on [place] sidewalk [place] [facility]. Pt was conscious upon EMS arrival. Pt denies drug use, pupils are pinpoint. No narcan administered.

## 2019-03-06 MED ORDER — NALOXONE HCL 4 MG/0.1ML NA LIQD
NASAL | Status: AC
  Administered 2019-03-06: 4 mg
  Filled 2019-03-06: qty 4

## 2019-10-10 ENCOUNTER — Other Ambulatory Visit: Payer: Self-pay | Admitting: Critical Care Medicine

## 2019-10-10 DIAGNOSIS — Z20822 Contact with and (suspected) exposure to covid-19: Secondary | ICD-10-CM

## 2019-10-11 LAB — NOVEL CORONAVIRUS, NAA: SARS-CoV-2, NAA: NOT DETECTED

## 2019-10-31 ENCOUNTER — Other Ambulatory Visit: Payer: Self-pay

## 2019-10-31 DIAGNOSIS — Z20822 Contact with and (suspected) exposure to covid-19: Secondary | ICD-10-CM

## 2019-11-02 LAB — NOVEL CORONAVIRUS, NAA: SARS-CoV-2, NAA: NOT DETECTED

## 2021-03-25 ENCOUNTER — Emergency Department (HOSPITAL_COMMUNITY): Payer: Self-pay

## 2021-03-25 ENCOUNTER — Other Ambulatory Visit: Payer: Self-pay

## 2021-03-25 ENCOUNTER — Encounter (HOSPITAL_COMMUNITY): Payer: Self-pay

## 2021-03-25 ENCOUNTER — Inpatient Hospital Stay (HOSPITAL_COMMUNITY)
Admission: EM | Admit: 2021-03-25 | Discharge: 2021-04-25 | DRG: 853 | Disposition: A | Discharge status: Home / Self Care | Payer: Self-pay | Attending: Internal Medicine | Admitting: Internal Medicine

## 2021-03-25 DIAGNOSIS — E871 Hypo-osmolality and hyponatremia: Secondary | ICD-10-CM | POA: Diagnosis not present

## 2021-03-25 DIAGNOSIS — R509 Fever, unspecified: Secondary | ICD-10-CM

## 2021-03-25 DIAGNOSIS — E860 Dehydration: Secondary | ICD-10-CM | POA: Diagnosis present

## 2021-03-25 DIAGNOSIS — Z5902 Unsheltered homelessness: Secondary | ICD-10-CM

## 2021-03-25 DIAGNOSIS — R64 Cachexia: Secondary | ICD-10-CM | POA: Diagnosis present

## 2021-03-25 DIAGNOSIS — R079 Chest pain, unspecified: Secondary | ICD-10-CM

## 2021-03-25 DIAGNOSIS — M546 Pain in thoracic spine: Secondary | ICD-10-CM | POA: Diagnosis present

## 2021-03-25 DIAGNOSIS — D75839 Thrombocytosis, unspecified: Secondary | ICD-10-CM | POA: Diagnosis not present

## 2021-03-25 DIAGNOSIS — G4700 Insomnia, unspecified: Secondary | ICD-10-CM | POA: Diagnosis not present

## 2021-03-25 DIAGNOSIS — R768 Other specified abnormal immunological findings in serum: Secondary | ICD-10-CM

## 2021-03-25 DIAGNOSIS — R059 Cough, unspecified: Secondary | ICD-10-CM

## 2021-03-25 DIAGNOSIS — F17210 Nicotine dependence, cigarettes, uncomplicated: Secondary | ICD-10-CM | POA: Diagnosis present

## 2021-03-25 DIAGNOSIS — D6489 Other specified anemias: Secondary | ICD-10-CM | POA: Diagnosis present

## 2021-03-25 DIAGNOSIS — I2690 Septic pulmonary embolism without acute cor pulmonale: Secondary | ICD-10-CM | POA: Diagnosis present

## 2021-03-25 DIAGNOSIS — R9389 Abnormal findings on diagnostic imaging of other specified body structures: Secondary | ICD-10-CM

## 2021-03-25 DIAGNOSIS — Z6820 Body mass index (BMI) 20.0-20.9, adult: Secondary | ICD-10-CM

## 2021-03-25 DIAGNOSIS — Z7289 Other problems related to lifestyle: Secondary | ICD-10-CM

## 2021-03-25 DIAGNOSIS — Y92230 Patient room in hospital as the place of occurrence of the external cause: Secondary | ICD-10-CM | POA: Diagnosis not present

## 2021-03-25 DIAGNOSIS — I368 Other nonrheumatic tricuspid valve disorders: Secondary | ICD-10-CM | POA: Diagnosis present

## 2021-03-25 DIAGNOSIS — B9562 Methicillin resistant Staphylococcus aureus infection as the cause of diseases classified elsewhere: Secondary | ICD-10-CM

## 2021-03-25 DIAGNOSIS — W19XXXA Unspecified fall, initial encounter: Secondary | ICD-10-CM | POA: Diagnosis not present

## 2021-03-25 DIAGNOSIS — A4102 Sepsis due to Methicillin resistant Staphylococcus aureus: Principal | ICD-10-CM | POA: Diagnosis present

## 2021-03-25 DIAGNOSIS — F1410 Cocaine abuse, uncomplicated: Secondary | ICD-10-CM | POA: Diagnosis present

## 2021-03-25 DIAGNOSIS — E878 Other disorders of electrolyte and fluid balance, not elsewhere classified: Secondary | ICD-10-CM | POA: Diagnosis present

## 2021-03-25 DIAGNOSIS — F1110 Opioid abuse, uncomplicated: Secondary | ICD-10-CM | POA: Diagnosis present

## 2021-03-25 DIAGNOSIS — J189 Pneumonia, unspecified organism: Secondary | ICD-10-CM | POA: Diagnosis present

## 2021-03-25 DIAGNOSIS — A310 Pulmonary mycobacterial infection: Secondary | ICD-10-CM | POA: Diagnosis present

## 2021-03-25 DIAGNOSIS — K219 Gastro-esophageal reflux disease without esophagitis: Secondary | ICD-10-CM | POA: Diagnosis present

## 2021-03-25 DIAGNOSIS — R000 Tachycardia, unspecified: Secondary | ICD-10-CM | POA: Diagnosis present

## 2021-03-25 DIAGNOSIS — D649 Anemia, unspecified: Secondary | ICD-10-CM | POA: Diagnosis present

## 2021-03-25 DIAGNOSIS — I76 Septic arterial embolism: Secondary | ICD-10-CM | POA: Diagnosis present

## 2021-03-25 DIAGNOSIS — F419 Anxiety disorder, unspecified: Secondary | ICD-10-CM | POA: Diagnosis present

## 2021-03-25 DIAGNOSIS — F1910 Other psychoactive substance abuse, uncomplicated: Secondary | ICD-10-CM | POA: Diagnosis present

## 2021-03-25 DIAGNOSIS — I313 Pericardial effusion (noninflammatory): Secondary | ICD-10-CM | POA: Diagnosis present

## 2021-03-25 DIAGNOSIS — G8929 Other chronic pain: Secondary | ICD-10-CM | POA: Diagnosis present

## 2021-03-25 DIAGNOSIS — E611 Iron deficiency: Secondary | ICD-10-CM | POA: Diagnosis present

## 2021-03-25 DIAGNOSIS — Z792 Long term (current) use of antibiotics: Secondary | ICD-10-CM

## 2021-03-25 DIAGNOSIS — R7881 Bacteremia: Secondary | ICD-10-CM

## 2021-03-25 DIAGNOSIS — B1920 Unspecified viral hepatitis C without hepatic coma: Secondary | ICD-10-CM | POA: Diagnosis present

## 2021-03-25 DIAGNOSIS — I330 Acute and subacute infective endocarditis: Principal | ICD-10-CM | POA: Diagnosis present

## 2021-03-25 DIAGNOSIS — Z20822 Contact with and (suspected) exposure to covid-19: Secondary | ICD-10-CM | POA: Diagnosis present

## 2021-03-25 DIAGNOSIS — I079 Rheumatic tricuspid valve disease, unspecified: Secondary | ICD-10-CM | POA: Diagnosis present

## 2021-03-25 LAB — DIFFERENTIAL
Abs Immature Granulocytes: 0.17 10*3/uL — ABNORMAL HIGH (ref 0.00–0.07)
Basophils Absolute: 0.1 10*3/uL (ref 0.0–0.1)
Basophils Relative: 0 %
Eosinophils Absolute: 0 10*3/uL (ref 0.0–0.5)
Eosinophils Relative: 0 %
Immature Granulocytes: 1 %
Lymphocytes Relative: 4 %
Lymphs Abs: 1.1 10*3/uL (ref 0.7–4.0)
Monocytes Absolute: 1.4 10*3/uL — ABNORMAL HIGH (ref 0.1–1.0)
Monocytes Relative: 5 %
Neutro Abs: 23 10*3/uL — ABNORMAL HIGH (ref 1.7–7.7)
Neutrophils Relative %: 90 %

## 2021-03-25 LAB — COMPREHENSIVE METABOLIC PANEL
ALT: 12 U/L (ref 0–44)
AST: 23 U/L (ref 15–41)
Albumin: 3.3 g/dL — ABNORMAL LOW (ref 3.5–5.0)
Alkaline Phosphatase: 153 U/L — ABNORMAL HIGH (ref 38–126)
Anion gap: 16 — ABNORMAL HIGH (ref 5–15)
BUN: 24 mg/dL — ABNORMAL HIGH (ref 6–20)
CO2: 25 mmol/L (ref 22–32)
Calcium: 9 mg/dL (ref 8.9–10.3)
Chloride: 89 mmol/L — ABNORMAL LOW (ref 98–111)
Creatinine, Ser: 1.2 mg/dL (ref 0.61–1.24)
GFR, Estimated: >60 mL/min (ref >60)
Glucose, Bld: 139 mg/dL — ABNORMAL HIGH (ref 70–99)
Potassium: 4.3 mmol/L (ref 3.5–5.1)
Sodium: 130 mmol/L — ABNORMAL LOW (ref 135–145)
Total Bilirubin: 1.2 mg/dL (ref 0.3–1.2)
Total Protein: 8.2 g/dL — ABNORMAL HIGH (ref 6.5–8.1)

## 2021-03-25 LAB — CREATININE, SERUM
Creatinine, Ser: 1.02 mg/dL (ref 0.61–1.24)
GFR, Estimated: >60 mL/min (ref >60)

## 2021-03-25 LAB — URINALYSIS, ROUTINE W REFLEX MICROSCOPIC
Bacteria, UA: NONE SEEN
Bilirubin Urine: NEGATIVE
Glucose, UA: NEGATIVE mg/dL
Ketones, ur: NEGATIVE mg/dL
Nitrite: POSITIVE — AB
Protein, ur: NEGATIVE mg/dL
Specific Gravity, Urine: >1.046 — ABNORMAL HIGH (ref 1.005–1.030)
pH: 6 (ref 5.0–8.0)

## 2021-03-25 LAB — BLOOD CULTURE ID PANEL (REFLEXED) - BCID2

## 2021-03-25 LAB — CBC
HCT: 34.4 % — ABNORMAL LOW (ref 39.0–52.0)
HCT: 33.9 % — ABNORMAL LOW (ref 39.0–52.0)
Hemoglobin: 12 g/dL — ABNORMAL LOW (ref 13.0–17.0)
Hemoglobin: 11.8 g/dL — ABNORMAL LOW (ref 13.0–17.0)
MCH: 29.3 pg (ref 26.0–34.0)
MCH: 29.4 pg (ref 26.0–34.0)
MCHC: 34.9 g/dL (ref 30.0–36.0)
MCHC: 34.8 g/dL (ref 30.0–36.0)
MCV: 84.1 fL (ref 80.0–100.0)
MCV: 84.3 fL (ref 80.0–100.0)
Platelets: 222 10*3/uL (ref 150–400)
Platelets: 213 10*3/uL (ref 150–400)
RBC: 4.09 MIL/uL — ABNORMAL LOW (ref 4.22–5.81)
RBC: 4.02 MIL/uL — ABNORMAL LOW (ref 4.22–5.81)
RDW: 12.7 % (ref 11.5–15.5)
RDW: 12.6 % (ref 11.5–15.5)
WBC: 25.8 10*3/uL — ABNORMAL HIGH (ref 4.0–10.5)
WBC: 26.6 10*3/uL — ABNORMAL HIGH (ref 4.0–10.5)
nRBC: 0 % (ref 0.0–0.2)
nRBC: 0 % (ref 0.0–0.2)

## 2021-03-25 LAB — I-STAT CHEM 8, ED
BUN: 22 mg/dL — ABNORMAL HIGH (ref 6–20)
Calcium, Ion: 1.08 mmol/L — ABNORMAL LOW (ref 1.15–1.40)
Chloride: 91 mmol/L — ABNORMAL LOW (ref 98–111)
Creatinine, Ser: 1 mg/dL (ref 0.61–1.24)
Glucose, Bld: 151 mg/dL — ABNORMAL HIGH (ref 70–99)
HCT: 37 % — ABNORMAL LOW (ref 39.0–52.0)
Hemoglobin: 12.6 g/dL — ABNORMAL LOW (ref 13.0–17.0)
Potassium: 3.1 mmol/L — ABNORMAL LOW (ref 3.5–5.1)
Sodium: 131 mmol/L — ABNORMAL LOW (ref 135–145)
TCO2: 29 mmol/L (ref 22–32)

## 2021-03-25 LAB — IRON AND TIBC
Iron: 21 ug/dL — ABNORMAL LOW (ref 45–182)
Saturation Ratios: 10 % — ABNORMAL LOW (ref 17.9–39.5)
TIBC: 209 ug/dL — ABNORMAL LOW (ref 250–450)
UIBC: 188 ug/dL

## 2021-03-25 LAB — PROTIME-INR
INR: 1.2 (ref 0.8–1.2)
Prothrombin Time: 15.5 seconds — ABNORMAL HIGH (ref 11.4–15.2)

## 2021-03-25 LAB — RETICULOCYTES
Immature Retic Fract: 6.7 % (ref 2.3–15.9)
RBC.: 3.92 MIL/uL — ABNORMAL LOW (ref 4.22–5.81)
Retic Count, Absolute: 22.3 10*3/uL (ref 19.0–186.0)
Retic Ct Pct: 0.6 % (ref 0.4–3.1)

## 2021-03-25 LAB — RAPID URINE DRUG SCREEN, HOSP PERFORMED
Amphetamines: POSITIVE — AB
Barbiturates: NOT DETECTED
Benzodiazepines: NOT DETECTED
Cocaine: NOT DETECTED
Opiates: POSITIVE — AB
Tetrahydrocannabinol: NOT DETECTED

## 2021-03-25 LAB — VITAMIN B12: Vitamin B-12: 207 pg/mL (ref 180–914)

## 2021-03-25 LAB — TROPONIN I (HIGH SENSITIVITY)
Troponin I (High Sensitivity): 9 ng/L (ref <18)
Troponin I (High Sensitivity): 5 ng/L (ref <18)

## 2021-03-25 LAB — HIV ANTIBODY (ROUTINE TESTING W REFLEX): HIV Screen 4th Generation wRfx: NONREACTIVE

## 2021-03-25 LAB — C-REACTIVE PROTEIN: CRP: 45.7 mg/dL — ABNORMAL HIGH (ref <1.0)

## 2021-03-25 LAB — LACTIC ACID, PLASMA
Lactic Acid, Venous: 2.4 mmol/L (ref 0.5–1.9)
Lactic Acid, Venous: 2.1 mmol/L (ref 0.5–1.9)

## 2021-03-25 LAB — SARS CORONAVIRUS 2 (TAT 6-24 HRS): SARS Coronavirus 2: NEGATIVE

## 2021-03-25 LAB — FERRITIN: Ferritin: 457 ng/mL — ABNORMAL HIGH (ref 24–336)

## 2021-03-25 LAB — SEDIMENTATION RATE: Sed Rate: 88 mm/hr — ABNORMAL HIGH (ref 0–16)

## 2021-03-25 LAB — FOLATE: Folate: 9.6 ng/mL (ref >5.9)
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
## 2021-03-25 IMAGING — CT CT ANGIO CHEST
2 of 6 series · 18 of 36 positions shown · IV contrast (omnipaque)
Comparison: None.

CLINICAL DATA: Chest pain.

EXAM:
CT ANGIOGRAPHY CHEST WITH CONTRAST
TECHNIQUE: Multidetector CT imaging of the chest was performed using the
standard protocol during bolus administration of intravenous
contrast. Multiplanar CT image reconstructions and MIPs were
obtained to evaluate the vascular anatomy.
CONTRAST:  75mL OMNIPAQUE IOHEXOL 350 MG/ML SOLN

[Series 6: thins · axial · 0.65mm/px · z∈[+1252,+1492]mm · 17 of 270 slices shown]
[im 15/270  lung]
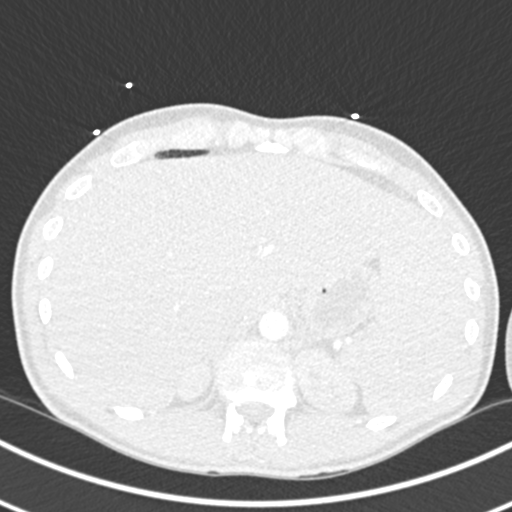
[im 30/270  mediastinal]
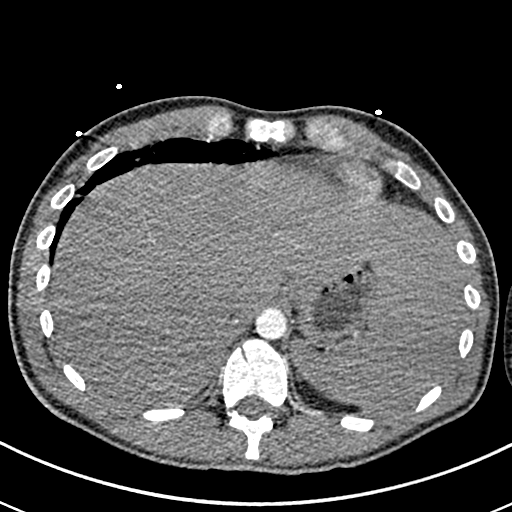
[im 45/270  lung]
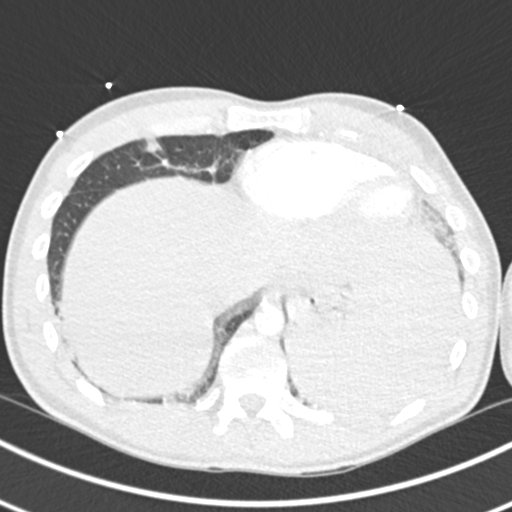
[im 60/270  mediastinal]
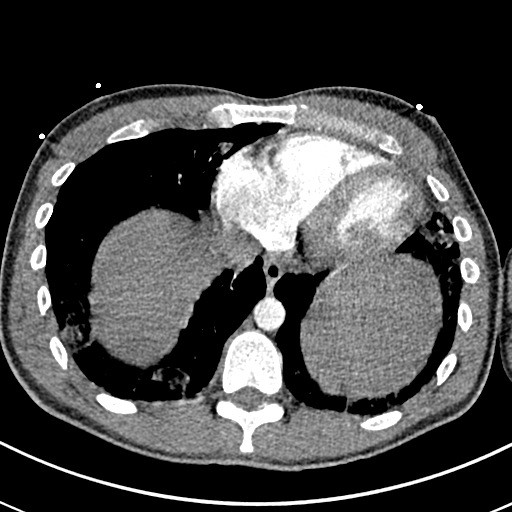
[im 75/270  lung]
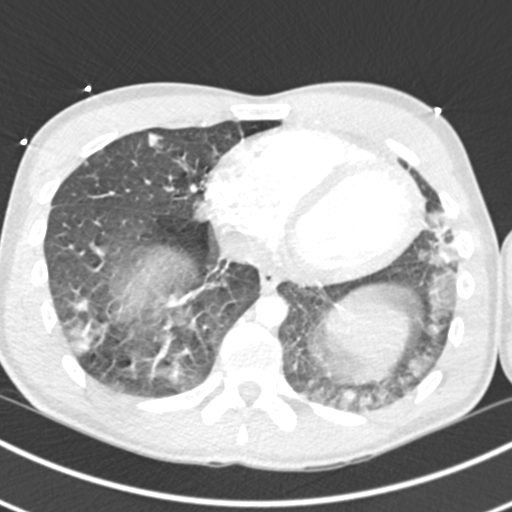
[im 90/270  mediastinal]
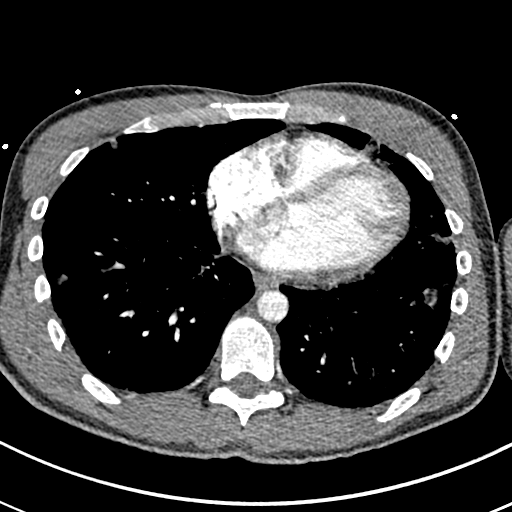
[im 105/270  lung]
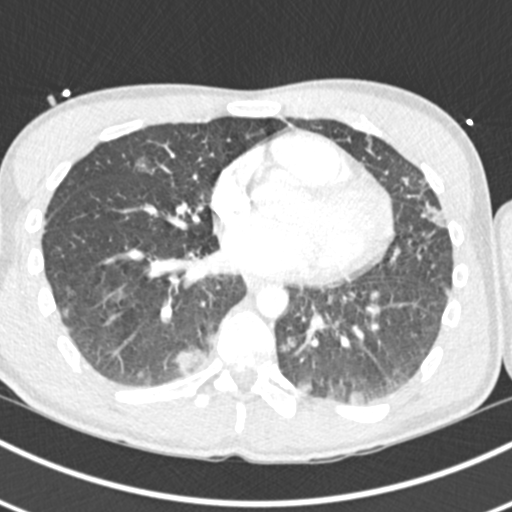
[im 120/270  mediastinal]
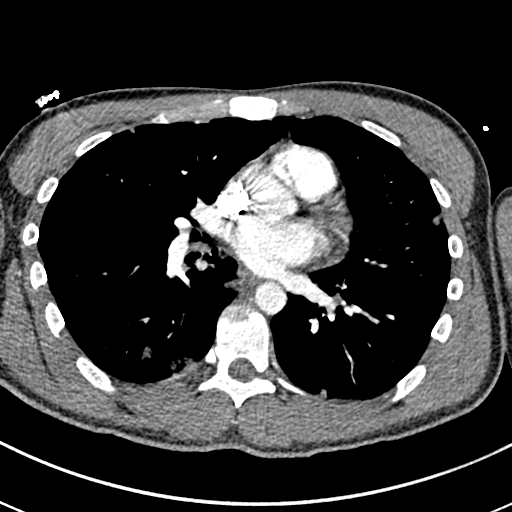
[im 135/270  lung]
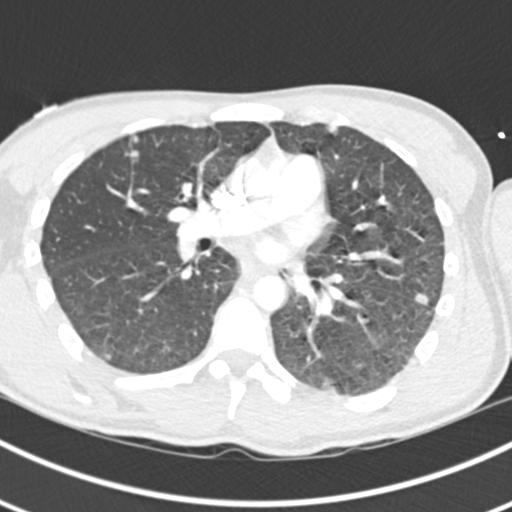
[im 150/270  mediastinal]
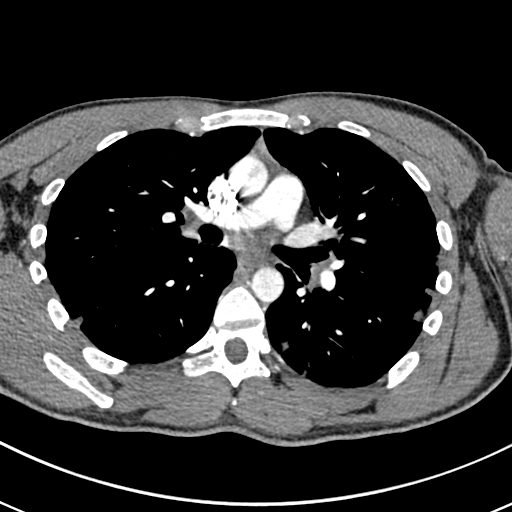
[im 165/270  lung]
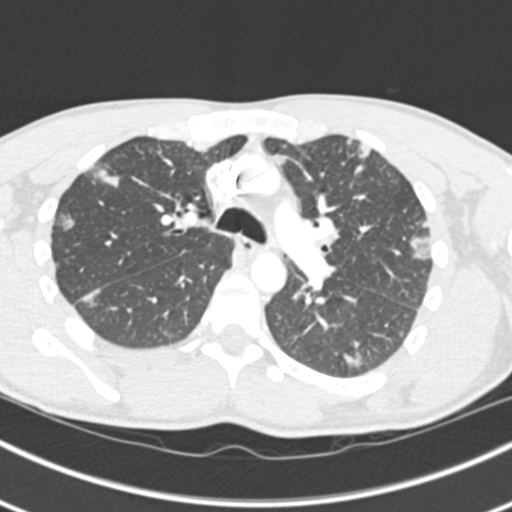
[im 180/270  mediastinal]
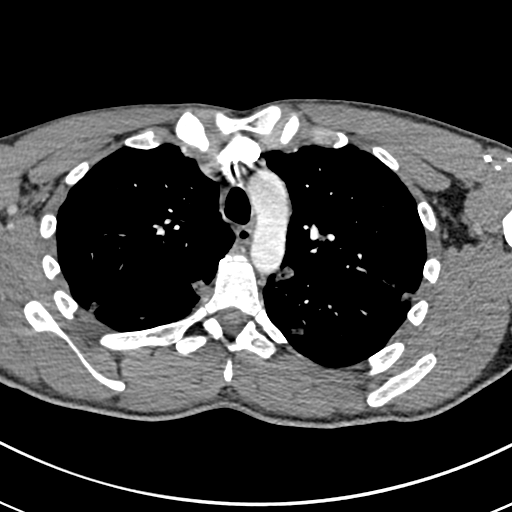
[im 195/270  lung]
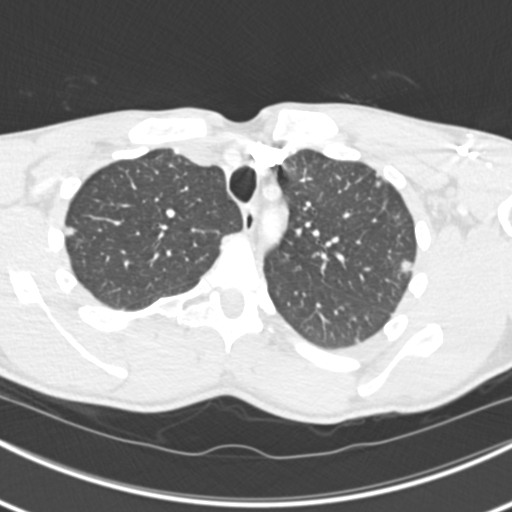
[im 210/270  mediastinal]
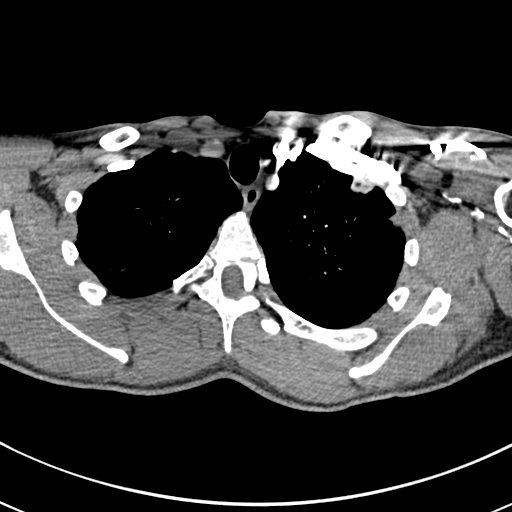
[im 225/270  lung]
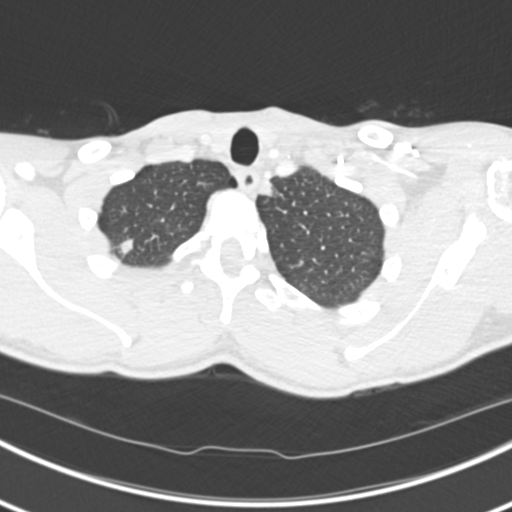
[im 240/270  mediastinal]
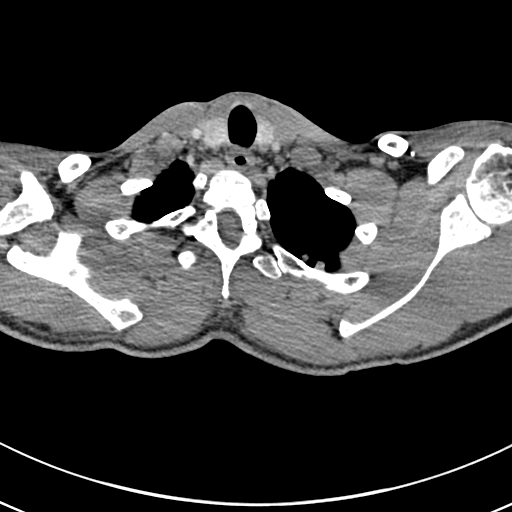
[im 255/270  lung]
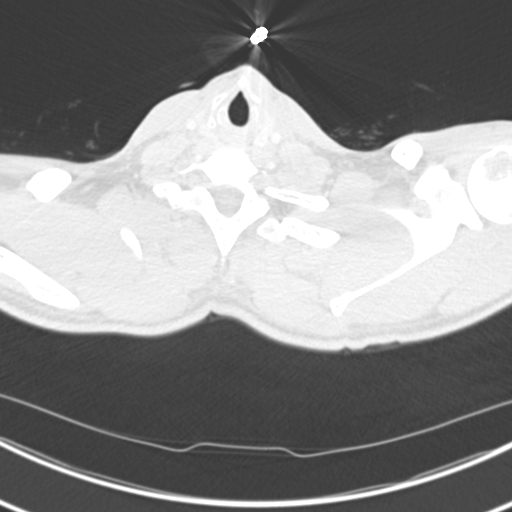

[Series 8: coronal mpr · coronal · 0.53mm/px · 1 of 122 slices shown]
[im 61/122  mediastinal]
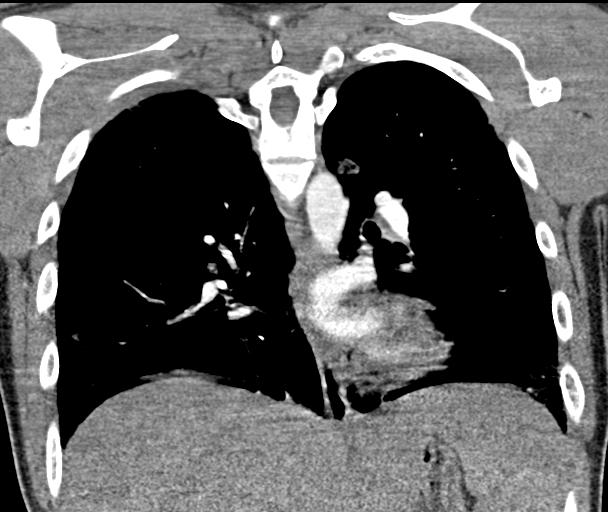

[18 of 36 positions shown; findings below may reference images not displayed]

FINDINGS: Cardiovascular: Satisfactory opacification of the pulmonary arteries
to the segmental level. No evidence of pulmonary embolism. Normal
heart size. No pericardial effusion.

Mediastinum/Nodes: No enlarged mediastinal, hilar, or axillary lymph
nodes. Thyroid gland, trachea, and esophagus demonstrate no
significant findings.

Lungs/Pleura: No pneumothorax or pleural effusion is noted. Patchy
airspace opacities are noted bilaterally, some of which are
cavitary, consistent with septic emboli.

Upper Abdomen: No acute abnormality.

Musculoskeletal: No chest wall abnormality. No acute or significant
osseous findings.

Review of the MIP images confirms the above findings.
IMPRESSION: No definite evidence of pulmonary embolus.

Multifocal patchy airspace opacities are noted bilaterally, some of
which are cavitary, consistent with septic emboli.

## 2021-03-25 MED ORDER — OXYCODONE-ACETAMINOPHEN 5-325 MG PO TABS
1.0000 | ORAL_TABLET | Freq: Once | ORAL | Status: AC
  Administered 2021-03-25: 1 via ORAL
  Filled 2021-03-25: qty 1

## 2021-03-25 MED ORDER — MORPHINE SULFATE (PF) 4 MG/ML IV SOLN
4.0000 mg | Freq: Once | INTRAVENOUS | Status: AC
  Administered 2021-03-25: 4 mg via INTRAVENOUS
  Filled 2021-03-25: qty 1

## 2021-03-25 MED ORDER — SODIUM CHLORIDE 0.9 % IV SOLN
2.0000 g | Freq: Three times a day (TID) | INTRAVENOUS | Status: DC
  Administered 2021-03-25 – 2021-03-26 (×3): 2 g via INTRAVENOUS
  Filled 2021-03-25 (×4): qty 2

## 2021-03-25 MED ORDER — VANCOMYCIN HCL 1500 MG/300ML IV SOLN
1500.0000 mg | Freq: Once | INTRAVENOUS | Status: AC
  Administered 2021-03-25: 1500 mg via INTRAVENOUS
  Filled 2021-03-25: qty 300

## 2021-03-25 MED ORDER — LACTATED RINGERS IV BOLUS
1000.0000 mL | Freq: Once | INTRAVENOUS | Status: AC
  Administered 2021-03-25: 1000 mL via INTRAVENOUS

## 2021-03-25 MED ORDER — SODIUM CHLORIDE (PF) 0.9 % IJ SOLN
INTRAMUSCULAR | Status: AC
  Filled 2021-03-25: qty 50

## 2021-03-25 MED ORDER — IOHEXOL 350 MG/ML SOLN
75.0000 mL | Freq: Once | INTRAVENOUS | Status: AC | PRN
  Administered 2021-03-25: 75 mL via INTRAVENOUS

## 2021-03-25 MED ORDER — ACETAMINOPHEN 325 MG PO TABS
650.0000 mg | ORAL_TABLET | Freq: Four times a day (QID) | ORAL | Status: DC | PRN
  Administered 2021-03-25 – 2021-04-02 (×6): 650 mg via ORAL
  Filled 2021-03-25 (×6): qty 2

## 2021-03-25 MED ORDER — KETOROLAC TROMETHAMINE 30 MG/ML IJ SOLN
30.0000 mg | Freq: Once | INTRAMUSCULAR | Status: AC
  Administered 2021-03-25: 30 mg via INTRAVENOUS
  Filled 2021-03-25: qty 1

## 2021-03-25 MED ORDER — VANCOMYCIN HCL 1250 MG/250ML IV SOLN
1250.0000 mg | Freq: Two times a day (BID) | INTRAVENOUS | Status: DC
  Administered 2021-03-25 – 2021-04-18 (×49): 1250 mg via INTRAVENOUS
  Filled 2021-03-25 (×52): qty 250

## 2021-03-25 MED ORDER — FENTANYL CITRATE (PF) 100 MCG/2ML IJ SOLN
75.0000 ug | Freq: Once | INTRAMUSCULAR | Status: AC
  Administered 2021-03-25: 75 ug via INTRAVENOUS
  Filled 2021-03-25: qty 2

## 2021-03-25 MED ORDER — ENOXAPARIN SODIUM 40 MG/0.4ML IJ SOSY
40.0000 mg | PREFILLED_SYRINGE | INTRAMUSCULAR | Status: DC
  Administered 2021-03-25 – 2021-04-05 (×12): 40 mg via SUBCUTANEOUS
  Filled 2021-03-25 (×12): qty 0.4

## 2021-03-25 MED ORDER — ACETAMINOPHEN 650 MG RE SUPP: 650.0000 mg | Freq: Four times a day (QID) | RECTAL | Status: DC | PRN

## 2021-03-25 MED ORDER — SODIUM CHLORIDE 0.9 % IV SOLN
2.0000 g | Freq: Once | INTRAVENOUS | Status: AC
  Administered 2021-03-25: 2 g via INTRAVENOUS
  Filled 2021-03-25: qty 2

## 2021-03-25 NOTE — ED Notes (Signed)
Attempted to call report for 1427, secretary said she is notifying the nurse to call me back.

## 2021-03-25 NOTE — ED Notes (Signed)
Patient returned from CT

## 2021-03-25 NOTE — Progress Notes (Signed)
Patient is c/o chest pain,rates pain at 10/10.  PCP on call was notified.
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
## 2021-03-25 NOTE — ED Provider Notes (Signed)
Physical Exam  BP 113/74 (BP Location: Right Arm)   Pulse 89   Temp 97.8 F (36.6 C) (Oral)   Resp 18   Ht 6' (1.829 m)   Wt 72.6 kg   SpO2 99%   BMI 21.70 kg/m   Physical Exam Vitals and nursing note reviewed.  Constitutional:      General: He is in acute distress.     Appearance: He is ill-appearing.     Comments: Ill-appearing and disheveled 35 year old male in no acute distress but acutely uncomfortable.  HENT:     Head: Normocephalic and atraumatic.     Nose: Nose normal.     Mouth/Throat:     Mouth: Mucous membranes are dry.  Eyes:     General: No scleral icterus. Cardiovascular:     Rate and Rhythm: Normal rate and regular rhythm.     Pulses: Normal pulses.     Heart sounds: Murmur heard.  Pulmonary:     Effort: Pulmonary effort is normal. No respiratory distress.     Breath sounds: No wheezing.     Comments: Faint crackles clear with deep breathing  Abdominal:     Palpations: Abdomen is soft.     Tenderness: There is no abdominal tenderness.  Musculoskeletal:     Cervical back: Normal range of motion.     Right lower leg: No edema.     Left lower leg: No edema.  Skin:    General: Skin is warm and dry.     Capillary Refill: Capillary refill takes less than 2 seconds.  Neurological:     Mental Status: He is alert. Mental status is at baseline.  Psychiatric:        Mood and Affect: Mood normal.        Behavior: Behavior normal.    ED Course/Procedures     .Critical Care  Date/Time: 03/25/2021 11:14 AM Performed by: Tedd Sias, PA Authorized by: Tedd Sias, PA   Critical care provider statement:    Critical care time (minutes):  35   Critical care time was exclusive of:  Separately billable procedures and treating other patients and teaching time   Critical care was necessary to treat or prevent imminent or life-threatening deterioration of the following conditions: sepsis - endocarditis.   Critical care was time spent personally by me on  the following activities:  Discussions with consultants, evaluation of patient's response to treatment, examination of patient, review of old charts, re-evaluation of patient's condition, pulse oximetry, ordering and review of radiographic studies, ordering and review of laboratory studies and ordering and performing treatments and interventions   I assumed direction of critical care for this patient from another provider in my specialty: no    MDM  Patient is a 35 year old male with history of IV drug use presented today withSweats chills worse at night ongoing for 7 days with some chest discomfort no significant cough no significant shortness of breath.  No hemoptysis.  Patient presentation concerning for septic emboli chest x-ray shows some evidence of this we will follow-up with CT PE study.  Prior provider has already ordered this.  We will follow-up on this.   Patient has no known exposure to tuberculosis he does have cavitary lesions on his CT PE study.  Concerning for septic emboli versus tuberculosis QuantiFERON gold added on.  Vancomycin and cefepime provided to patient as well as morphine for pain he is also received Toradol and fentanyl.  Placed on broad-spectrum antibiotics.  CBC with  leukocytosis of 25.8.  No significant anemia.  Significant left shift on differential.  ESR and CRP significantly elevated.  Coags without any significant abnormality. CMP notable for mild hyponatremia and hypochloremia likely secondary to dehydration BUN mildly elevated denies any dark or tarry stools.  Discussed with Dr. Hal Hope of hospitalist who will admit.  Laster Appling was evaluated in Emergency Department on 03/25/2021 for the symptoms described in the history of present illness. He was evaluated in the context of the global COVID-19 pandemic, which necessitated consideration that the patient might be at risk for infection with the SARS-CoV-2 virus that causes COVID-19. Institutional protocols  and algorithms that pertain to the evaluation of patients at risk for COVID-19 are in a state of rapid change based on information released by regulatory bodies including the CDC and federal and state organizations. These policies and algorithms were followed during the patient's care in the ED.    Pati Gallo [place], [place] 03/25/21 1119    Sherwood Gambler, MD 03/26/21 1536
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
## 2021-03-25 NOTE — ED Notes (Signed)
Pt is sleeping in NAD, vitals WNL.

## 2021-03-25 NOTE — Progress Notes (Signed)
Pharmacy Antibiotic Note  Duane Price is a 35 y.o. male admitted on 03/25/2021 with sepsis.  Pharmacy has been consulted for vancomycin and cefepime dosing.  IVDA. Presents with chest pain, chills, possible fever. Already given one time doses in the ED. Afebrile, WBC 25.8. SCr 1. CT showing possible septic emboli in lungs.  Plan: Cefepime 2g IV Q8h Vancomycin 1,250mg  IV Q12h Monitor clinical picture, renal function, vanc levels prn F/U C&S, abx deescalation / LOT  Height: 6' (182.9 cm) Weight: 72.6 kg (160 lb) IBW/kg (Calculated) : 77.6  Temp (24hrs), Avg:97.8 F (36.6 C), Min:97.8 F (36.6 C), Max:97.8 F (36.6 C)  Recent Labs  Lab 03/25/21 0507 03/25/21 0630 03/25/21 0641 03/25/21 0745  WBC  --   --   --  25.8*  CREATININE 1.20  --   --  1.00  LATICACIDVEN  --  2.4* 2.1*  --     Estimated Creatinine Clearance: 108.9 mL/min (by C-G formula based on SCr of 1 mg/dL).    No Known Allergies  Antimicrobials this admission: Cefepime 7/1 >> Vancomycin 7/1 >>   Dose adjustments this admission:   Microbiology results: 7/1 BCx: sent  7/1 Resp panel: sent 7/1 COVID: sent  Thank you for allowing pharmacy to be a part of this patient's care. Enzo Bi, PharmD, BCPS, BCIDP Clinical Pharmacist 03/25/2021 11:12 AM

## 2021-03-25 NOTE — ED Notes (Signed)
Lunch tray provided. 

## 2021-03-25 NOTE — Progress Notes (Signed)
A consult was received from an ED physician for vancomycin per pharmacy dosing.  The patient's profile has been reviewed for ht/wt/allergies/indication/available labs.   A one time order has been placed for vancomycin 1.5g IV x 1.  Further antibiotics/pharmacy consults should be ordered by admitting physician if indicated.                       Thank you, Enzo Bi, PharmD, BCPS, BCIDP Clinical Pharmacist 03/25/2021 8:56 AM
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
## 2021-03-25 NOTE — ED Notes (Signed)
Patient transported to CT 
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
## 2021-03-26 ENCOUNTER — Inpatient Hospital Stay (HOSPITAL_COMMUNITY): Payer: Self-pay

## 2021-03-26 DIAGNOSIS — R7881 Bacteremia: Secondary | ICD-10-CM

## 2021-03-26 DIAGNOSIS — B9562 Methicillin resistant Staphylococcus aureus infection as the cause of diseases classified elsewhere: Secondary | ICD-10-CM

## 2021-03-26 DIAGNOSIS — I76 Septic arterial embolism: Secondary | ICD-10-CM

## 2021-03-26 DIAGNOSIS — F1910 Other psychoactive substance abuse, uncomplicated: Secondary | ICD-10-CM

## 2021-03-26 LAB — CBC
HCT: 35.8 % — ABNORMAL LOW (ref 39.0–52.0)
Hemoglobin: 12.2 g/dL — ABNORMAL LOW (ref 13.0–17.0)
MCH: 28.5 pg (ref 26.0–34.0)
MCHC: 34.1 g/dL (ref 30.0–36.0)
MCV: 83.6 fL (ref 80.0–100.0)
Platelets: 216 10*3/uL (ref 150–400)
RBC: 4.28 MIL/uL (ref 4.22–5.81)
RDW: 12.9 % (ref 11.5–15.5)
WBC: 24.9 10*3/uL — ABNORMAL HIGH (ref 4.0–10.5)
nRBC: 0 % (ref 0.0–0.2)

## 2021-03-26 LAB — ECHOCARDIOGRAM COMPLETE
Area-P 1/2: 3.42 cm2
Height: 72 in
S' Lateral: 2.9 cm
Single Plane A2C EF: 63.7 %
Weight: 2560 oz

## 2021-03-26 LAB — COMPREHENSIVE METABOLIC PANEL
ALT: 12 U/L (ref 0–44)
AST: 18 U/L (ref 15–41)
Albumin: 2.8 g/dL — ABNORMAL LOW (ref 3.5–5.0)
Alkaline Phosphatase: 159 U/L — ABNORMAL HIGH (ref 38–126)
Anion gap: 14 (ref 5–15)
BUN: 27 mg/dL — ABNORMAL HIGH (ref 6–20)
CO2: 26 mmol/L (ref 22–32)
Calcium: 8.8 mg/dL — ABNORMAL LOW (ref 8.9–10.3)
Chloride: 90 mmol/L — ABNORMAL LOW (ref 98–111)
Creatinine, Ser: 1 mg/dL (ref 0.61–1.24)
GFR, Estimated: >60 mL/min (ref >60)
Glucose, Bld: 169 mg/dL — ABNORMAL HIGH (ref 70–99)
Potassium: 3.2 mmol/L — ABNORMAL LOW (ref 3.5–5.1)
Sodium: 130 mmol/L — ABNORMAL LOW (ref 135–145)
Total Bilirubin: 1.1 mg/dL (ref 0.3–1.2)
Total Protein: 8.1 g/dL (ref 6.5–8.1)

## 2021-03-26 LAB — HEPATITIS PANEL, ACUTE
HCV Ab: REACTIVE — AB
Hep A IgM: NONREACTIVE
Hep B C IgM: NONREACTIVE
Hepatitis B Surface Ag: NONREACTIVE

## 2021-03-26 MED ORDER — POTASSIUM CHLORIDE 20 MEQ PO PACK
40.0000 meq | PACK | Freq: Once | ORAL | Status: AC
  Administered 2021-03-26: 40 meq via ORAL
  Filled 2021-03-26: qty 2

## 2021-03-26 MED ORDER — KETOROLAC TROMETHAMINE 30 MG/ML IJ SOLN
30.0000 mg | Freq: Once | INTRAMUSCULAR | Status: AC
  Administered 2021-03-26: 30 mg via INTRAVENOUS
  Filled 2021-03-26: qty 1

## 2021-03-26 MED ORDER — OXYCODONE-ACETAMINOPHEN 5-325 MG PO TABS
1.0000 | ORAL_TABLET | Freq: Four times a day (QID) | ORAL | Status: DC | PRN
Start: 2021-03-26 — End: 2021-03-27
  Administered 2021-03-26 – 2021-03-27 (×6): 1 via ORAL
  Filled 2021-03-26 (×7): qty 1

## 2021-03-26 NOTE — Consult Note (Signed)
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 stenosis is present.  6. The inferior vena cava is normal in size with greater than 50% respiratory variability, suggesting right atrial pressure of 3 mmHg. FINDINGS  Left Ventricle: Left ventricular ejection fraction, by estimation, is 60 to 65%. The left ventricle has normal function. The left ventricle has no regional wall motion abnormalities. The left ventricular internal cavity size was normal in size. There is  no left ventricular hypertrophy. Left ventricular diastolic parameters were normal. Right Ventricle: The right ventricular size is normal.Right ventricular systolic function is normal. There is normal pulmonary artery systolic pressure. The tricuspid regurgitant velocity is 2.13 m/s, and with an assumed right atrial pressure of 3 mmHg, the estimated right ventricular systolic pressure is 21.1 mmHg. Left Atrium: Left atrial size was normal in size. Right Atrium: Right atrial size was normal in size. Pericardium: Trivial pericardial effusion is present. Mitral Valve: The mitral valve is normal in structure. Trivial mitral valve regurgitation. No evidence of mitral valve stenosis. Tricuspid Valve: The tricuspid valve is normal in structure. Tricuspid valve regurgitation is mild . No evidence of tricuspid stenosis. Aortic Valve: The aortic valve is tricuspid. Aortic valve regurgitation is not visualized. No aortic stenosis is present. Pulmonic Valve: The pulmonic valve was normal in structure. Pulmonic valve regurgitation is not visualized. No evidence of pulmonic stenosis. Aorta: The aortic root is normal in size and structure. Venous: The inferior vena cava is normal in size with greater than 50% respiratory variability, suggesting right atrial pressure of 3 mmHg. IAS/Shunts: No atrial level shunt detected by color flow Doppler. Additional Comments: Large (1.9 x 1.5 cm) oscillating mass on TV consistent with vegetation.  LEFT VENTRICLE PLAX 2D LVIDd:         4.80 cm      Diastology LVIDs:         2.90 cm     LV e' medial:    11.20 cm/s LV PW:         1.20 cm     LV E/e' medial:  5.9 LV IVS:        1.00 cm     LV e' lateral:   12.40 cm/s LVOT diam:     2.30 cm     LV E/e' lateral: 5.4 LV SV:         73 LV SV Index:   38 LVOT Area:     4.15 cm  LV Volumes (MOD) LV vol d, MOD A2C: 87.5 ml LV vol s, MOD A2C: 31.8 ml LV SV MOD A2C:     55.7 ml RIGHT VENTRICLE             IVC RV S prime:     14.70 cm/s  IVC diam: 1.60 cm TAPSE (M-mode): 2.7 cm LEFT ATRIUM             Index  RIGHT ATRIUM           Index LA diam:        4.40 cm 2.27 cm/m  RA Area:     11.40 cm LA Vol (A2C):   32.1 ml 16.57 ml/m RA Volume:   23.50 ml  12.13 ml/m LA Vol (A4C):   20.5 ml 10.58 ml/m LA Biplane Vol: 27.0 ml 13.93 ml/m  AORTIC VALVE LVOT Vmax:   106.00 cm/s LVOT Vmean:  67.900 cm/s LVOT VTI:    0.176 m  AORTA Ao Root diam: 2.90 cm Ao Asc diam:  3.00 cm MITRAL VALVE               TRICUSPID VALVE MV Area (PHT): 3.42 cm    TR Peak grad:   18.1 mmHg MV Decel Time: 222 msec    TR Vmax:        213.00 cm/s MV E velocity: 66.35 cm/s MV A velocity: 57.20 cm/s  SHUNTS MV E/A ratio:  1.16        Systemic VTI:  0.18 m                            Systemic Diam: 2.30 cm Olga Millers MD Electronically signed by Olga Millers MD Signature Date/Time: 03/26/2021/12:56:55 PM    Final    Recent Results (from the past 240 hour(s))  Blood culture (routine x 2)     Status: Abnormal (Preliminary result)   Collection Time: 03/25/21  5:07 AM   Specimen: BLOOD  Result Value Ref Range Status   Specimen Description   Final    BLOOD RIGHT ANTECUBITAL Performed at [facility], [address] [place], [place] [address]    Special Requests   Final    BOTTLES DRAWN AEROBIC ONLY Blood Culture adequate volume Performed at [facility], [address] [place], [place] [address]    Culture  Setup Time   Final    GRAM POSITIVE COCCI IN CLUSTERS AEROBIC BOTTLE ONLY Organism ID to  follow CRITICAL RESULT CALLED TO, READ BACK BY AND VERIFIED WITH: E JACKSON PHARMD 03/25/21 2301 JDW    Culture (A)  Final    STAPHYLOCOCCUS AUREUS SUSCEPTIBILITIES TO FOLLOW Performed at [facility], [address] [place], [place] [address]    Report Status PENDING  Incomplete  Blood Culture ID Panel (Reflexed)     Status: Abnormal   Collection Time: 03/25/21  5:07 AM  Result Value Ref Range Status   Enterococcus faecalis NOT DETECTED NOT DETECTED Final   Enterococcus Faecium NOT DETECTED NOT DETECTED Final   Listeria monocytogenes NOT DETECTED NOT DETECTED Final   Staphylococcus species DETECTED (A) NOT DETECTED Final    Comment: CRITICAL RESULT CALLED TO, READ BACK BY AND VERIFIED WITH: E JACKSON PHARMD 03/25/21 2301 JDW    Staphylococcus aureus (BCID) DETECTED (A) NOT DETECTED Final    Comment: Methicillin (oxacillin)-resistant Staphylococcus aureus (MRSA). MRSA is predictably resistant to beta-lactam antibiotics (except ceftaroline). Preferred therapy is vancomycin unless clinically contraindicated. Patient requires contact precautions if  hospitalized. CRITICAL RESULT CALLED TO, READ BACK BY AND VERIFIED WITH: E JACKSON PHARMD 03/25/21 2301 JDW    Staphylococcus epidermidis NOT DETECTED NOT DETECTED Final   Staphylococcus lugdunensis NOT DETECTED NOT DETECTED Final   Streptococcus species NOT DETECTED NOT DETECTED Final   Streptococcus agalactiae NOT DETECTED NOT DETECTED Final   Streptococcus pneumoniae NOT DETECTED NOT DETECTED Final   Streptococcus  pyogenes NOT DETECTED NOT DETECTED Final   A.calcoaceticus-baumannii NOT DETECTED NOT DETECTED Final   Bacteroides fragilis NOT DETECTED NOT DETECTED Final   Enterobacterales NOT DETECTED NOT DETECTED Final   Enterobacter cloacae complex NOT DETECTED NOT DETECTED Final   Escherichia coli NOT DETECTED NOT DETECTED Final   Klebsiella aerogenes NOT DETECTED NOT DETECTED Final   Klebsiella oxytoca NOT DETECTED NOT DETECTED  Final   Klebsiella pneumoniae NOT DETECTED NOT DETECTED Final   Proteus species NOT DETECTED NOT DETECTED Final   Salmonella species NOT DETECTED NOT DETECTED Final   Serratia marcescens NOT DETECTED NOT DETECTED Final   Haemophilus influenzae NOT DETECTED NOT DETECTED Final   Neisseria meningitidis NOT DETECTED NOT DETECTED Final   Pseudomonas aeruginosa NOT DETECTED NOT DETECTED Final   Stenotrophomonas maltophilia NOT DETECTED NOT DETECTED Final   Candida albicans NOT DETECTED NOT DETECTED Final   Candida auris NOT DETECTED NOT DETECTED Final   Candida glabrata NOT DETECTED NOT DETECTED Final   Candida krusei NOT DETECTED NOT DETECTED Final   Candida parapsilosis NOT DETECTED NOT DETECTED Final   Candida tropicalis NOT DETECTED NOT DETECTED Final   Cryptococcus neoformans/gattii NOT DETECTED NOT DETECTED Final   Meth resistant mecA/C and MREJ DETECTED (A) NOT DETECTED Final    Comment: CRITICAL RESULT CALLED TO, READ BACK BY AND VERIFIED WITHErling Cruz [facility] 03/25/21 2301 JDW Performed at [facility], [address], [place], [place] [address]   SARS CORONAVIRUS 2 (TAT 6-24 HRS) Nasopharyngeal Nasopharyngeal Swab     Status: None   Collection Time: 03/25/21  5:15 AM   Specimen: Nasopharyngeal Swab  Result Value Ref Range Status   SARS Coronavirus 2 NEGATIVE NEGATIVE Final    Comment: (NOTE) SARS-CoV-2 target nucleic acids are NOT DETECTED.  The SARS-CoV-2 RNA is generally detectable in upper and lower respiratory specimens during the acute phase of infection. Negative results do not preclude SARS-CoV-2 infection, do not rule out co-infections with other pathogens, and should not be used as the sole basis for treatment or other patient management decisions. Negative results must be combined with clinical observations, patient history, and epidemiological information. The expected result is Negative.  Fact Sheet for  Patients: HairSlick.no  Fact Sheet for Healthcare Providers: quierodirigir.com  This test is not yet approved or cleared by the [place] FDA and  has been authorized for detection and/or diagnosis of SARS-CoV-2 by FDA under an Emergency Use Authorization (EUA). This EUA will remain  in effect (meaning this test can be used) for the duration of the COVID-19 declaration under Se ction 564(b)(1) of the Act, 21 U.S.C. section 360bbb-3(b)(1), unless the authorization is terminated or revoked sooner.  Performed at [facility], [address] [place], [place] [address]   Blood culture (routine x 2)     Status: None (Preliminary result)   Collection Time: 03/25/21  5:49 AM   Specimen: BLOOD  Result Value Ref Range Status   Specimen Description   Final    BLOOD LEFT ARM Performed at [facility], [address] [place], [place] [address]    Special Requests   Final    BOTTLES DRAWN AEROBIC AND ANAEROBIC Blood Culture results may not be optimal due to an inadequate volume of blood received in culture bottles Performed at [facility], [address], [place], [place] [address]    Culture  Setup Time   Final    GRAM POSITIVE COCCI IN CLUSTERS IN BOTH AEROBIC  AND ANAEROBIC BOTTLES IDENTIFICATION TO FOLLOW CRITICAL VALUE NOTED.  VALUE IS CONSISTENT WITH PREVIOUSLY REPORTED AND CALLED VALUE. Performed at [facility], [address] [place], [place] [address]    Culture GRAM POSITIVE COCCI  Final   Report Status PENDING  Incomplete    Microbiology: Recent Results (from the past 240 hour(s))  Blood culture (routine x 2)     Status: Abnormal (Preliminary result)   Collection Time: 03/25/21  5:07 AM   Specimen: BLOOD  Result Value Ref Range Status   Specimen Description   Final    BLOOD RIGHT ANTECUBITAL Performed at [facility], [address]  [place], [place] [address]    Special Requests   Final    BOTTLES DRAWN AEROBIC ONLY Blood Culture adequate volume Performed at [facility], [address] [place], [place] [address]    Culture  Setup Time   Final    GRAM POSITIVE COCCI IN CLUSTERS AEROBIC BOTTLE ONLY Organism ID to follow CRITICAL RESULT CALLED TO, READ BACK BY AND VERIFIED WITH: E JACKSON PHARMD 03/25/21 2301 JDW    Culture (A)  Final    STAPHYLOCOCCUS AUREUS SUSCEPTIBILITIES TO FOLLOW Performed at [facility], [address] [place], [place] [address]    Report Status PENDING  Incomplete  Blood Culture ID Panel (Reflexed)     Status: Abnormal   Collection Time: 03/25/21  5:07 AM  Result Value Ref Range Status   Enterococcus faecalis NOT DETECTED NOT DETECTED Final   Enterococcus Faecium NOT DETECTED NOT DETECTED Final   Listeria monocytogenes NOT DETECTED NOT DETECTED Final   Staphylococcus species DETECTED (A) NOT DETECTED Final    Comment: CRITICAL RESULT CALLED TO, READ BACK BY AND VERIFIED WITH: E JACKSON PHARMD 03/25/21 2301 JDW    Staphylococcus aureus (BCID) DETECTED (A) NOT DETECTED Final    Comment: Methicillin (oxacillin)-resistant Staphylococcus aureus (MRSA). MRSA is predictably resistant to beta-lactam antibiotics (except ceftaroline). Preferred therapy is vancomycin unless clinically contraindicated. Patient requires contact precautions if  hospitalized. CRITICAL RESULT CALLED TO, READ BACK BY AND VERIFIED WITH: E JACKSON PHARMD 03/25/21 2301 JDW    Staphylococcus epidermidis NOT DETECTED NOT DETECTED Final   Staphylococcus lugdunensis NOT DETECTED NOT DETECTED Final   Streptococcus species NOT DETECTED NOT DETECTED Final   Streptococcus agalactiae NOT DETECTED NOT DETECTED Final   Streptococcus pneumoniae NOT DETECTED NOT DETECTED Final   Streptococcus pyogenes NOT DETECTED NOT DETECTED Final   A.calcoaceticus-baumannii NOT DETECTED NOT DETECTED Final   Bacteroides fragilis  NOT DETECTED NOT DETECTED Final   Enterobacterales NOT DETECTED NOT DETECTED Final   Enterobacter cloacae complex NOT DETECTED NOT DETECTED Final   Escherichia coli NOT DETECTED NOT DETECTED Final   Klebsiella aerogenes NOT DETECTED NOT DETECTED Final   Klebsiella oxytoca NOT DETECTED NOT DETECTED Final   Klebsiella pneumoniae NOT DETECTED NOT DETECTED Final   Proteus species NOT DETECTED NOT DETECTED Final   Salmonella species NOT DETECTED NOT DETECTED Final   Serratia marcescens NOT DETECTED NOT DETECTED Final   Haemophilus influenzae NOT DETECTED NOT DETECTED Final   Neisseria meningitidis NOT DETECTED NOT DETECTED Final   Pseudomonas aeruginosa NOT DETECTED NOT DETECTED Final   Stenotrophomonas maltophilia NOT DETECTED NOT DETECTED Final   Candida albicans NOT DETECTED NOT DETECTED Final   Candida auris NOT DETECTED NOT DETECTED Final   Candida glabrata NOT DETECTED NOT DETECTED Final   Candida krusei NOT DETECTED NOT DETECTED Final   Candida parapsilosis NOT DETECTED  NOT DETECTED Final   Candida tropicalis NOT DETECTED NOT DETECTED Final   Cryptococcus neoformans/gattii NOT DETECTED NOT DETECTED Final   Meth resistant mecA/C and MREJ DETECTED (A) NOT DETECTED Final    Comment: CRITICAL RESULT CALLED TO, READ BACK BY AND VERIFIED WITHErling Cruz [facility] 03/25/21 2301 JDW Performed at [facility], [address], [place], [place] [address]   SARS CORONAVIRUS 2 (TAT 6-24 HRS) Nasopharyngeal Nasopharyngeal Swab     Status: None   Collection Time: 03/25/21  5:15 AM   Specimen: Nasopharyngeal Swab  Result Value Ref Range Status   SARS Coronavirus 2 NEGATIVE NEGATIVE Final    Comment: (NOTE) SARS-CoV-2 target nucleic acids are NOT DETECTED.  The SARS-CoV-2 RNA is generally detectable in upper and lower respiratory specimens during the acute phase of infection. Negative results do not preclude SARS-CoV-2 infection, do not rule out co-infections with other pathogens, and should  not be used as the sole basis for treatment or other patient management decisions. Negative results must be combined with clinical observations, patient history, and epidemiological information. The expected result is Negative.  Fact Sheet for Patients: HairSlick.no  Fact Sheet for Healthcare Providers: quierodirigir.com  This test is not yet approved or cleared by the [place] FDA and  has been authorized for detection and/or diagnosis of SARS-CoV-2 by FDA under an Emergency Use Authorization (EUA). This EUA will remain  in effect (meaning this test can be used) for the duration of the COVID-19 declaration under Se ction 564(b)(1) of the Act, 21 U.S.C. section 360bbb-3(b)(1), unless the authorization is terminated or revoked sooner.  Performed at [facility], [address] [place], [place] [address]   Blood culture (routine x 2)     Status: None (Preliminary result)   Collection Time: 03/25/21  5:49 AM   Specimen: BLOOD  Result Value Ref Range Status   Specimen Description   Final    BLOOD LEFT ARM Performed at [facility], [address] [place], [place] [address]    Special Requests   Final    BOTTLES DRAWN AEROBIC AND ANAEROBIC Blood Culture results may not be optimal due to an inadequate volume of blood received in culture bottles Performed at [facility], [address] [place], [place] [address]    Culture  Setup Time   Final    GRAM POSITIVE COCCI IN CLUSTERS IN BOTH AEROBIC AND ANAEROBIC BOTTLES IDENTIFICATION TO FOLLOW CRITICAL VALUE NOTED.  VALUE IS CONSISTENT WITH PREVIOUSLY REPORTED AND CALLED VALUE. Performed at [facility], [address], [place], [place] [address]    Culture GRAM POSITIVE COCCI  Final   Report Status PENDING  Incomplete    Radiographs and labs were personally reviewed by me.   Johny Sax, MD [facility] [phone] 03/26/2021, 1:21 PM

## 2021-03-26 NOTE — Progress Notes (Addendum)
  Echocardiogram 2D Echocardiogram has been performed. Dr. Rennis Golden notified of TV vegetation.   Janalyn Harder 03/26/2021, 10:49 AM

## 2021-03-26 NOTE — Progress Notes (Signed)
2D echo attempted, try later per RN
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
## 2021-03-27 DIAGNOSIS — I079 Rheumatic tricuspid valve disease, unspecified: Secondary | ICD-10-CM | POA: Diagnosis present

## 2021-03-27 LAB — CULTURE, BLOOD (ROUTINE X 2): Special Requests: ADEQUATE

## 2021-03-27 LAB — RPR: RPR Ser Ql: NONREACTIVE

## 2021-03-27 LAB — CBC WITH DIFFERENTIAL/PLATELET
Abs Immature Granulocytes: 0.33 10*3/uL — ABNORMAL HIGH (ref 0.00–0.07)
Basophils Absolute: 0.1 10*3/uL (ref 0.0–0.1)
Basophils Relative: 0 %
Eosinophils Absolute: 0 10*3/uL (ref 0.0–0.5)
Eosinophils Relative: 0 %
HCT: 33.8 % — ABNORMAL LOW (ref 39.0–52.0)
Hemoglobin: 12 g/dL — ABNORMAL LOW (ref 13.0–17.0)
Immature Granulocytes: 1 %
Lymphocytes Relative: 8 %
Lymphs Abs: 1.9 10*3/uL (ref 0.7–4.0)
MCH: 29.3 pg (ref 26.0–34.0)
MCHC: 35.5 g/dL (ref 30.0–36.0)
MCV: 82.6 fL (ref 80.0–100.0)
Monocytes Absolute: 1.5 10*3/uL — ABNORMAL HIGH (ref 0.1–1.0)
Monocytes Relative: 6 %
Neutro Abs: 19.5 10*3/uL — ABNORMAL HIGH (ref 1.7–7.7)
Neutrophils Relative %: 85 %
Platelets: 235 10*3/uL (ref 150–400)
RBC: 4.09 MIL/uL — ABNORMAL LOW (ref 4.22–5.81)
RDW: 12.9 % (ref 11.5–15.5)
WBC: 23.4 10*3/uL — ABNORMAL HIGH (ref 4.0–10.5)
nRBC: 0 % (ref 0.0–0.2)

## 2021-03-27 LAB — PHOSPHORUS: Phosphorus: 2.6 mg/dL (ref 2.5–4.6)

## 2021-03-27 LAB — BASIC METABOLIC PANEL
Anion gap: 11 (ref 5–15)
BUN: 14 mg/dL (ref 6–20)
CO2: 27 mmol/L (ref 22–32)
Calcium: 8.7 mg/dL — ABNORMAL LOW (ref 8.9–10.3)
Chloride: 93 mmol/L — ABNORMAL LOW (ref 98–111)
Creatinine, Ser: 0.71 mg/dL (ref 0.61–1.24)
GFR, Estimated: >60 mL/min (ref >60)
Glucose, Bld: 141 mg/dL — ABNORMAL HIGH (ref 70–99)
Potassium: 3.6 mmol/L (ref 3.5–5.1)
Sodium: 131 mmol/L — ABNORMAL LOW (ref 135–145)

## 2021-03-27 LAB — MAGNESIUM: Magnesium: 1.8 mg/dL (ref 1.7–2.4)

## 2021-03-27 MED ORDER — TRAZODONE HCL 50 MG PO TABS
50.0000 mg | ORAL_TABLET | Freq: Every evening | ORAL | Status: DC | PRN
  Administered 2021-03-28 – 2021-04-01 (×6): 50 mg via ORAL
  Filled 2021-03-27 (×7): qty 1

## 2021-03-27 MED ORDER — OXYCODONE-ACETAMINOPHEN 5-325 MG PO TABS
1.0000 | ORAL_TABLET | Freq: Four times a day (QID) | ORAL | Status: DC | PRN
  Administered 2021-03-28: 2 via ORAL
  Administered 2021-03-28: 1 via ORAL
  Administered 2021-03-28 – 2021-03-30 (×5): 2 via ORAL
  Filled 2021-03-27: qty 2
  Filled 2021-03-27: qty 1
  Filled 2021-03-27: qty 2
  Filled 2021-03-27: qty 1
  Filled 2021-03-27 (×3): qty 2
  Filled 2021-03-27: qty 1

## 2021-03-27 NOTE — Plan of Care (Signed)
  Problem: Health Behavior/Discharge Planning: Goal: Ability to manage health-related needs will improve Outcome: Progressing   Problem: Clinical Measurements: Goal: Will remain free from infection Outcome: Progressing   Problem: Activity: Goal: Risk for activity intolerance will decrease Outcome: Progressing   

## 2021-03-27 NOTE — Consult Note (Signed)
CONSULTATION NOTE   Patient Name: Duane Price Date of Encounter: 03/27/2021 Cardiologist: None Electrophysiologist: None Advanced Heart Failure: None   Chief Complaint   Evaluate for TV vegetation, reports chest pain  Patient Profile   35 yo male with history of IVDU, now with MRSA bacteremia and large TV vegetation on 2D echo  HPI   Duane Price is a 35 y.o. male who is being seen today for the evaluation of TV vegetation at the request of Dr. Lucianne Muss. This is a 35 year old male with history of IV drug use who presented with 1 week of chest pain.  He was found to have signs of septic pulmonary emboli with a high white blood cell count.  Blood cultures of grown 3 out of 4 bottles with MRSA.  Surface echocardiogram demonstrated a large tricuspid valve vegetation.  Cardiology is asked to further evaluate for this. His main complaint is chest pain - says he is not getting enough pain meds. Has been using heroin. Suspect pain is due to septic emboli.  PMHx   History reviewed. No pertinent past medical history.  Past Surgical History:  Procedure Laterality Date   BRAIN SURGERY     facial surgery      FAMHx   Family History  Family history unknown: Yes    SOCHx    reports that he has been smoking cigarettes. He has been smoking an average of 0.50 packs per day. He has never used smokeless tobacco. He reports current alcohol use. He reports current drug use. Drugs: Cocaine and IV.  Outpatient Medications   No current facility-administered medications on file prior to encounter.   No current outpatient medications on file prior to encounter.    Inpatient Medications    Scheduled Meds:  enoxaparin (LOVENOX) injection  40 mg Subcutaneous Q24H    Continuous Infusions:  vancomycin 1,250 mg (03/26/21 2123)    PRN Meds: acetaminophen **OR** acetaminophen, oxyCODONE-acetaminophen   ALLERGIES   No Known Allergies  ROS   Pertinent items noted in HPI and remainder  of comprehensive ROS otherwise negative.  Vitals   Vitals:   03/26/21 1413 03/26/21 2120 03/26/21 2234 03/27/21 0536  BP: 121/74 123/62 126/68 126/63  Pulse: 94 75 75 88  Resp: 20 20 20 18   Temp: 97.8 F (36.6 C) 98.4 F (36.9 C) 98.5 F (36.9 C) 98.6 F (37 C)  TempSrc: Oral Oral Oral Oral  SpO2: 98% 100% 98% 100%  Weight:      Height:        Intake/Output Summary (Last 24 hours) at 03/27/2021 0855 Last data filed at 03/26/2021 2250 Gross per 24 hour  Intake 912.65 ml  Output 1450 ml  Net -537.35 ml   Filed Weights   03/25/21 0304  Weight: 72.6 kg    Physical Exam   General appearance: alert and no distress Neck: no carotid bruit, no JVD, and thyroid not enlarged, symmetric, no tenderness/mass/nodules Lungs: clear to auscultation bilaterally Heart: regular rate and rhythm, S1, S2 normal, and systolic murmur: early systolic 2/6, blowing at 2nd left intercostal space Abdomen: soft, non-tender; bowel sounds normal; no masses,  no organomegaly Extremities: extremities normal, atraumatic, no cyanosis or edema Pulses: 2+ and symmetric Skin: Skin color, texture, turgor normal. No rashes or lesions Neurologic: Grossly normal Psych: Pleasant  Labs   Results for orders placed or performed during the hospital encounter of 03/25/21 (from the past 48 hour(s))  HIV Antibody (routine testing w rflx)     Status: None  Collection Time: 03/25/21 11:09 AM  Result Value Ref Range   HIV Screen 4th Generation wRfx Non Reactive Non Reactive    Comment: Performed at [facility], [address], [place], [place] [address]  CBC     Status: Abnormal   Collection Time: 03/25/21 11:09 AM  Result Value Ref Range   WBC 26.6 (H) 4.0 - 10.5 K/uL   RBC 4.02 (L) 4.22 - 5.81 MIL/uL   Hemoglobin 11.8 (L) 13.0 - 17.0 g/dL   HCT 54.0 (L) 98.1 - 19.1 %   MCV 84.3 80.0 - 100.0 fL   MCH 29.4 26.0 - 34.0 pg   MCHC 34.8 30.0 - 36.0 g/dL   RDW 47.8 29.5 - 62.1 %   Platelets 213 150 - 400 K/uL    nRBC 0.0 0.0 - 0.2 %    Comment: Performed at [facility], [address] [place], [place] [address]  Creatinine, serum     Status: None   Collection Time: 03/25/21 11:09 AM  Result Value Ref Range   Creatinine, Ser 1.02 0.61 - 1.24 mg/dL   GFR, Estimated >78 >46 mL/min    Comment: (NOTE) Calculated using the CKD-EPI Creatinine Equation (2021) Performed at [facility], [address] [place], [place] [address]   Troponin I (High Sensitivity)     Status: None   Collection Time: 03/25/21 11:09 AM  Result Value Ref Range   Troponin I (High Sensitivity) 9 <18 ng/L    Comment: (NOTE) Elevated high sensitivity troponin I (hsTnI) values and significant  changes across serial measurements may suggest ACS but many other  chronic and acute conditions are known to elevate hsTnI results.  Refer to the "Links" section for chest pain algorithms and additional  guidance. Performed at [facility], [address], [place], [place] [address]   Vitamin B12     Status: None   Collection Time: 03/25/21 11:09 AM  Result Value Ref Range   Vitamin B-12 207 180 - 914 pg/mL    Comment: (NOTE) This assay is not validated for testing neonatal or myeloproliferative syndrome specimens for Vitamin B12 levels. Performed at [facility], [address], [place], [place] [address]   Folate     Status: None   Collection Time: 03/25/21 11:09 AM  Result Value Ref Range   Folate 9.6 >5.9 ng/mL    Comment: Performed at [facility], [address], [place], [place] [address]  Iron and TIBC     Status: Abnormal   Collection Time: 03/25/21 11:09 AM  Result Value Ref Range   Iron 21 (L) 45 - 182 ug/dL   TIBC 366 (L) 440 - 347 ug/dL   Saturation Ratios 10 (L) 17.9 - 39.5 %   UIBC 188 ug/dL    Comment: Performed at [facility], [address] [place], [place] [address]  Ferritin     Status:  Abnormal   Collection Time: 03/25/21 11:09 AM  Result Value Ref Range   Ferritin 457 (H) 24 - 336 ng/mL    Comment: Performed at [facility], [address] [place], [place] [address]  Reticulocytes     Status: Abnormal   Collection Time: 03/25/21 11:09 AM  Result Value Ref Range   Retic Ct Pct 0.6 0.4 - 3.1 %   RBC. 3.92 (L) 4.22 - 5.81 MIL/uL   Retic Count, Absolute 22.3 19.0 - 186.0 K/uL   Immature Retic Fract  6.7 2.3 - 15.9 %    Comment: Performed at [facility], [address] [place], [place] [address]  Hepatitis panel, acute     Status: Abnormal   Collection Time: 03/25/21 11:09 AM  Result Value Ref Range   Hepatitis B Surface Ag NON REACTIVE NON REACTIVE   HCV Ab Reactive (A) NON REACTIVE    Comment: (NOTE) The CDC recommends that a Reactive HCV antibody result be followed up  with a HCV Nucleic Acid Amplification test.     Hep A IgM NON REACTIVE NON REACTIVE   Hep B C IgM NON REACTIVE NON REACTIVE    Comment: Performed at [facility], [address] [place], [place] [address]  Urinalysis, Routine w reflex microscopic Urine, Clean Catch     Status: Abnormal   Collection Time: 03/25/21  1:04 PM  Result Value Ref Range   Color, Urine YELLOW YELLOW   APPearance CLEAR CLEAR   Specific Gravity, Urine >1.046 (H) 1.005 - 1.030   pH 6.0 5.0 - 8.0   Glucose, UA NEGATIVE NEGATIVE mg/dL   Hgb urine dipstick MODERATE (A) NEGATIVE   Bilirubin Urine NEGATIVE NEGATIVE   Ketones, ur NEGATIVE NEGATIVE mg/dL   Protein, ur NEGATIVE NEGATIVE mg/dL   Nitrite POSITIVE (A) NEGATIVE   Leukocytes,Ua TRACE (A) NEGATIVE   RBC / HPF 11-20 0 - 5 RBC/hpf   WBC, UA 6-10 0 - 5 WBC/hpf   Bacteria, UA NONE SEEN NONE SEEN   Squamous Epithelial / LPF 0-5 0 - 5    Comment: Performed at [facility], [address], [place], [place] [address]  Rapid urine drug screen (hospital performed)     Status: Abnormal   Collection Time:  03/25/21  1:04 PM  Result Value Ref Range   Opiates POSITIVE (A) NONE DETECTED   Cocaine NONE DETECTED NONE DETECTED   Benzodiazepines NONE DETECTED NONE DETECTED   Amphetamines POSITIVE (A) NONE DETECTED   Tetrahydrocannabinol NONE DETECTED NONE DETECTED   Barbiturates NONE DETECTED NONE DETECTED    Comment: (NOTE) DRUG SCREEN FOR MEDICAL PURPOSES ONLY.  IF CONFIRMATION IS NEEDED FOR ANY PURPOSE, NOTIFY LAB WITHIN 5 DAYS.  LOWEST DETECTABLE LIMITS FOR URINE DRUG SCREEN Drug Class                     Cutoff (ng/mL) Amphetamine and metabolites    1000 Barbiturate and metabolites    200 Benzodiazepine                 200 Tricyclics and metabolites     300 Opiates and metabolites        300 Cocaine and metabolites        300 THC                            50 Performed at [facility], [address] [place], [place] [address]   Troponin I (High Sensitivity)     Status: None   Collection Time: 03/25/21  2:00 PM  Result Value Ref Range   Troponin I (High Sensitivity) 5 <18 ng/L    Comment: (NOTE) Elevated high sensitivity troponin I (hsTnI) values and significant  changes across serial measurements may suggest ACS but many other  chronic and acute conditions are known to elevate hsTnI results.  Refer to the "Links" section for chest pain algorithms and additional  guidance. Performed at [facility], [address] [place], [place] [address]   Comprehensive metabolic panel     Status: Abnormal   Collection Time: 03/26/21  4:04 AM  Result Value Ref Range   Sodium 130 (L) 135 - 145 mmol/L   Potassium 3.2 (L) 3.5 - 5.1 mmol/L   Chloride 90 (L) 98 - 111 mmol/L   CO2 26 22 - 32 mmol/L   Glucose, Bld 169 (H) 70 - 99 mg/dL    Comment: Glucose reference range applies only to samples taken after fasting for at least 8 hours.   BUN 27 (H) 6 - 20 mg/dL   Creatinine, Ser 6.041.00 0.61 - 1.24 mg/dL   Calcium 8.8 (L) 8.9 - 10.3 mg/dL   Total  Protein 8.1 6.5 - 8.1 g/dL   Albumin 2.8 (L) 3.5 - 5.0 g/dL   AST 18 15 - 41 U/L   ALT 12 0 - 44 U/L   Alkaline Phosphatase 159 (H) 38 - 126 U/L   Total Bilirubin 1.1 0.3 - 1.2 mg/dL   GFR, Estimated >54>60 >09>60 mL/min    Comment: (NOTE) Calculated using the CKD-EPI Creatinine Equation (2021)    Anion gap 14 5 - 15    Comment: Performed at [facility], [address] [place], [place] [address]  CBC     Status: Abnormal   Collection Time: 03/26/21  4:04 AM  Result Value Ref Range   WBC 24.9 (H) 4.0 - 10.5 K/uL   RBC 4.28 4.22 - 5.81 MIL/uL   Hemoglobin 12.2 (L) 13.0 - 17.0 g/dL   HCT 47.835.8 (L) 29.539.0 - 62.152.0 %   MCV 83.6 80.0 - 100.0 fL   MCH 28.5 26.0 - 34.0 pg   MCHC 34.1 30.0 - 36.0 g/dL   RDW 30.812.9 65.711.5 - 84.615.5 %   Platelets 216 150 - 400 K/uL   nRBC 0.0 0.0 - 0.2 %    Comment: Performed at [facility], [address] [place], [place] [address]  Basic metabolic panel     Status: Abnormal   Collection Time: 03/27/21  7:12 AM  Result Value Ref Range   Sodium 131 (L) 135 - 145 mmol/L   Potassium 3.6 3.5 - 5.1 mmol/L   Chloride 93 (L) 98 - 111 mmol/L   CO2 27 22 - 32 mmol/L   Glucose, Bld 141 (H) 70 - 99 mg/dL    Comment: Glucose reference range applies only to samples taken after fasting for at least 8 hours.   BUN 14 6 - 20 mg/dL   Creatinine, Ser 2.840.71 0.61 - 1.24 mg/dL   Calcium 8.7 (L) 8.9 - 10.3 mg/dL   GFR, Estimated >13>60 >24>60 mL/min    Comment: (NOTE) Calculated using the CKD-EPI Creatinine Equation (2021)    Anion gap 11 5 - 15    Comment: Performed at [facility], [address] [place], [place] [address]  Magnesium     Status: None   Collection Time: 03/27/21  7:12 AM  Result Value Ref Range   Magnesium 1.8 1.7 - 2.4 mg/dL    Comment: Performed at [facility], [address] [place], [place] [address]  Phosphorus     Status: None   Collection Time: 03/27/21  7:12 AM  Result Value Ref  Range   Phosphorus 2.6 2.5 - 4.6 mg/dL    Comment: Performed at [facility], [address] [place], [place] [address]    ECG   Normal  sinus rhythm at 93 - Personally Reviewed  Telemetry   Sinus rhythm - Personally Reviewed  Radiology   ECHOCARDIOGRAM COMPLETE  Result Date: 03/26/2021    ECHOCARDIOGRAM REPORT   Patient Name:   Duane Price Date of Exam: 03/26/2021 Medical Rec #:  [id number]     Height:       72.0 in Accession #:    [id number]    Weight:       160.0 lb Date of Birth:  [birth year]     BSA:          1.938 m Patient Age:    34 years      BP:           100/60 mmHg Patient Gender: M             HR:           90 bpm. Exam Location:  Inpatient Procedure: 2D Echo, 3D Echo, Cardiac Doppler and Color Doppler                             MODIFIED REPORT: This report was modified by Olga Millers MD on 03/26/2021 due to Change.  Indications:     Bacteremia  History:         Patient has no prior history of Echocardiogram examinations.                  Signs/Symptoms:Chest Pain. Septic embolism. IVDU.  Sonographer:     Sheralyn Boatman RDCS Referring Phys:  [id number] [facility] Diagnosing Phys: Olga Millers MD  Sonographer Comments: Image acquisition challenging due to uncooperative patient. Patient could not stop rolling back and forth in bed throughout exam. IMPRESSIONS  1. Large (1.9 x 1.5 cm) oscillating mass on TV consistent with vegetation. Findings discussed with Cipriano Bunker MD.  2. Left ventricular ejection fraction, by estimation, is 60 to 65%. The left ventricle has normal function. The left ventricle has no regional wall motion abnormalities. Left ventricular diastolic parameters were normal.  3. Right ventricular systolic function is normal. The right ventricular size is normal. There is normal pulmonary artery systolic pressure.  4. The mitral valve is normal in structure. Trivial mitral valve regurgitation. No evidence of mitral stenosis.  5. The aortic valve is  tricuspid. Aortic valve regurgitation is not visualized. No aortic stenosis is present.  6. The inferior vena cava is normal in size with greater than 50% respiratory variability, suggesting right atrial pressure of 3 mmHg. FINDINGS  Left Ventricle: Left ventricular ejection fraction, by estimation, is 60 to 65%. The left ventricle has normal function. The left ventricle has no regional wall motion abnormalities. The left ventricular internal cavity size was normal in size. There is  no left ventricular hypertrophy. Left ventricular diastolic parameters were normal. Right Ventricle: The right ventricular size is normal. Right ventricular systolic function is normal. There is normal pulmonary artery systolic pressure. The tricuspid regurgitant velocity is 2.13 m/s, and with an assumed right atrial pressure of 3 mmHg,  the estimated right ventricular systolic pressure is 21.1 mmHg. Left Atrium: Left atrial size was normal in size. Right Atrium: Right atrial size was normal in size. Pericardium: Trivial pericardial effusion is present. Mitral Valve: The mitral valve is normal in structure. Trivial mitral valve regurgitation. No evidence of mitral valve stenosis. Tricuspid Valve: The tricuspid valve is normal in structure. Tricuspid valve regurgitation is mild . No evidence of tricuspid stenosis. Aortic Valve:  The aortic valve is tricuspid. Aortic valve regurgitation is not visualized. No aortic stenosis is present. Pulmonic Valve: The pulmonic valve was normal in structure. Pulmonic valve regurgitation is not visualized. No evidence of pulmonic stenosis. Aorta: The aortic root is normal in size and structure. Venous: The inferior vena cava is normal in size with greater than 50% respiratory variability, suggesting right atrial pressure of 3 mmHg. IAS/Shunts: No atrial level shunt detected by color flow Doppler. Additional Comments: Large (1.9 x 1.5 cm) oscillating mass on TV consistent with vegetation. Findings  discussed with Cipriano Bunker MD.  LEFT VENTRICLE PLAX 2D LVIDd:         4.80 cm     Diastology LVIDs:         2.90 cm     LV e' medial:    11.20 cm/s LV PW:         1.20 cm     LV E/e' medial:  5.9 LV IVS:        1.00 cm     LV e' lateral:   12.40 cm/s LVOT diam:     2.30 cm     LV E/e' lateral: 5.4 LV SV:         73 LV SV Index:   38 LVOT Area:     4.15 cm  LV Volumes (MOD) LV vol d, MOD A2C: 87.5 ml LV vol s, MOD A2C: 31.8 ml LV SV MOD A2C:     55.7 ml RIGHT VENTRICLE             IVC RV S prime:     14.70 cm/s  IVC diam: 1.60 cm TAPSE (M-mode): 2.7 cm LEFT ATRIUM             Index       RIGHT ATRIUM           Index LA diam:        4.40 cm 2.27 cm/m  RA Area:     11.40 cm LA Vol (A2C):   32.1 ml 16.57 ml/m RA Volume:   23.50 ml  12.13 ml/m LA Vol (A4C):   20.5 ml 10.58 ml/m LA Biplane Vol: 27.0 ml 13.93 ml/m  AORTIC VALVE LVOT Vmax:   106.00 cm/s LVOT Vmean:  67.900 cm/s LVOT VTI:    0.176 m  AORTA Ao Root diam: 2.90 cm Ao Asc diam:  3.00 cm MITRAL VALVE               TRICUSPID VALVE MV Area (PHT): 3.42 cm    TR Peak grad:   18.1 mmHg MV Decel Time: 222 msec    TR Vmax:        213.00 cm/s MV E velocity: 66.35 cm/s MV A velocity: 57.20 cm/s  SHUNTS MV E/A ratio:  1.16        Systemic VTI:  0.18 m                            Systemic Diam: 2.30 cm Olga Millers MD Electronically signed by Olga Millers MD Signature Date/Time: 03/26/2021/12:56:55 PM    Final (Updated)     Cardiac Studies   See echo  Impression   Principal Problem:   Septic embolism (HCC) Active Problems:   Normocytic anemia   IV drug abuse (HCC)   Endocarditis of tricuspid valve   Recommendation   Echo confirms TV endocariditis - TEE is not absolutely necessary as the diagnosis is  made, unless it changes management - there is no significant TV insufficiency or evidence of perforation. Mangement options are long-term antibiotics, probably unlikely to get open-heart surgery (there is large vegetation, however, no current  evidence of valve destruction) - the other possibility is angiojet debulking- I believe this has been done by Dr. Cliffton Asters with CT surgery. We could pursue TEE if intervention is planned - most likely on Tuesday.  Thanks for the consultation.  Time Spent Directly with Patient:  I have spent a total of 45 minutes with the patient reviewing hospital notes, telemetry, EKGs, labs and examining the patient as well as establishing an assessment and plan that was discussed personally with the patient.  > 50% of time was spent in direct patient care.  Length of Stay:  LOS: 2 days   Chrystie Nose, MD, [facility], FACP  [place]  [facility]  Medical Director of the Advanced Lipid Disorders &  Cardiovascular Risk Reduction Clinic Diplomate of the American Board of Clinical Lipidology Attending Cardiologist  Direct Dial: [phone]  Fax: [phone]  Website:  [email or web address] Nicely Laticha Ferrucci 03/27/2021, 8:55 AM

## 2021-03-27 NOTE — Progress Notes (Signed)
Patient refused morning lab draws.  PCP was notified. Lab will try again to draw labs at 0800.

## 2021-03-27 NOTE — Progress Notes (Signed)
INFECTIOUS DISEASE PROGRESS NOTE  ID: Duane Price is a 35 y.o. male with  Principal Problem:   Septic embolism (HCC) Active Problems:   Normocytic anemia   IV drug abuse (HCC)   Endocarditis of tricuspid valve  Subjective: No complaints, resting quietly.   Abtx:  Anti-infectives (From admission, onward)    Start     Dose/Rate Route Frequency Ordered Stop   03/25/21 2200  vancomycin (VANCOREADY) IVPB 1250 mg/250 mL        1,250 mg 166.7 mL/hr over 90 Minutes Intravenous Every 12 hours 03/25/21 1114     03/25/21 1600  ceFEPIme (MAXIPIME) 2 g in sodium chloride 0.9 % 100 mL IVPB  Status:  Discontinued        2 g 200 mL/hr over 30 Minutes Intravenous Every 8 hours 03/25/21 1114 03/26/21 1240   03/25/21 0845  ceFEPIme (MAXIPIME) 2 g in sodium chloride 0.9 % 100 mL IVPB        2 g 200 mL/hr over 30 Minutes Intravenous  Once 03/25/21 0835 03/25/21 0918   03/25/21 0845  vancomycin (VANCOREADY) IVPB 1500 mg/300 mL        1,500 mg 150 mL/hr over 120 Minutes Intravenous  Once 03/25/21 0843 03/25/21 1130       Medications: Scheduled:  enoxaparin (LOVENOX) injection  40 mg Subcutaneous Q24H    Objective: Vital signs in last 24 hours: Temp:  [97.8 F (36.6 C)-98.6 F (37 C)] 98.6 F (37 C) (07/03 0536) Pulse Rate:  [75-94] 88 (07/03 0536) Resp:  [18-20] 18 (07/03 0536) BP: (121-126)/(62-74) 126/63 (07/03 0536) SpO2:  [98 %-100 %] 100 % (07/03 0536)   General appearance: no distress Resp: clear to auscultation bilaterally Cardio: regular rate and rhythm GI: normal findings: bowel sounds normal and soft, non-tender  Lab Results Recent Labs    03/25/21 1109 03/26/21 0404 03/27/21 0712  WBC 26.6* 24.9*  --   HGB 11.8* 12.2*  --   HCT 33.9* 35.8*  --   NA  --  130* 131*  K  --  3.2* 3.6  CL  --  90* 93*  CO2  --  26 27  BUN  --  27* 14  CREATININE 1.02 1.00 0.71   Liver Panel Recent Labs    03/25/21 0507 03/26/21 0404  PROT 8.2* 8.1  ALBUMIN 3.3* 2.8*   AST 23 18  ALT 12 12  ALKPHOS 153* 159*  BILITOT 1.2 1.1   Sedimentation Rate Recent Labs    03/25/21 0646  ESRSEDRATE 88*   C-Reactive Protein Recent Labs    03/25/21 0646  CRP 45.7*    Microbiology: Recent Results (from the past 240 hour(s))  Blood culture (routine x 2)     Status: Abnormal   Collection Time: 03/25/21  5:07 AM   Specimen: BLOOD  Result Value Ref Range Status   Specimen Description   Final    BLOOD RIGHT ANTECUBITAL Performed at [facility], [address] [place], [place] [address]    Special Requests   Final    BOTTLES DRAWN AEROBIC ONLY Blood Culture adequate volume Performed at [facility], [address], [place], [place] [address]    Culture  Setup Time   Final    GRAM POSITIVE COCCI IN CLUSTERS AEROBIC BOTTLE ONLY CRITICAL RESULT CALLED TO, READ BACK BY AND VERIFIED WITH: Erling Cruz [facility] 03/25/21 2301 JDW Performed at [facility], [address], [place], [place] [address]  Culture METHICILLIN RESISTANT STAPHYLOCOCCUS AUREUS (A)  Final   Report Status 03/27/2021 FINAL  Final   Organism ID, Bacteria METHICILLIN RESISTANT STAPHYLOCOCCUS AUREUS  Final      Susceptibility   Methicillin resistant staphylococcus aureus - MIC*    CIPROFLOXACIN >=8 RESISTANT Resistant     ERYTHROMYCIN >=8 RESISTANT Resistant     GENTAMICIN <=0.5 SENSITIVE Sensitive     OXACILLIN >=4 RESISTANT Resistant     TETRACYCLINE <=1 SENSITIVE Sensitive     VANCOMYCIN 1 SENSITIVE Sensitive     TRIMETH/SULFA <=10 SENSITIVE Sensitive     CLINDAMYCIN <=0.25 SENSITIVE Sensitive     RIFAMPIN <=0.5 SENSITIVE Sensitive     Inducible Clindamycin NEGATIVE Sensitive     * METHICILLIN RESISTANT STAPHYLOCOCCUS AUREUS  Blood Culture ID Panel (Reflexed)     Status: Abnormal   Collection Time: 03/25/21  5:07 AM  Result Value Ref Range Status   Enterococcus faecalis NOT DETECTED NOT DETECTED Final   Enterococcus Faecium NOT DETECTED  NOT DETECTED Final   Listeria monocytogenes NOT DETECTED NOT DETECTED Final   Staphylococcus species DETECTED (A) NOT DETECTED Final    Comment: CRITICAL RESULT CALLED TO, READ BACK BY AND VERIFIED WITH: E JACKSON PHARMD 03/25/21 2301 JDW    Staphylococcus aureus (BCID) DETECTED (A) NOT DETECTED Final    Comment: Methicillin (oxacillin)-resistant Staphylococcus aureus (MRSA). MRSA is predictably resistant to beta-lactam antibiotics (except ceftaroline). Preferred therapy is vancomycin unless clinically contraindicated. Patient requires contact precautions if  hospitalized. CRITICAL RESULT CALLED TO, READ BACK BY AND VERIFIED WITH: E JACKSON PHARMD 03/25/21 2301 JDW    Staphylococcus epidermidis NOT DETECTED NOT DETECTED Final   Staphylococcus lugdunensis NOT DETECTED NOT DETECTED Final   Streptococcus species NOT DETECTED NOT DETECTED Final   Streptococcus agalactiae NOT DETECTED NOT DETECTED Final   Streptococcus pneumoniae NOT DETECTED NOT DETECTED Final   Streptococcus pyogenes NOT DETECTED NOT DETECTED Final   A.calcoaceticus-baumannii NOT DETECTED NOT DETECTED Final   Bacteroides fragilis NOT DETECTED NOT DETECTED Final   Enterobacterales NOT DETECTED NOT DETECTED Final   Enterobacter cloacae complex NOT DETECTED NOT DETECTED Final   Escherichia coli NOT DETECTED NOT DETECTED Final   Klebsiella aerogenes NOT DETECTED NOT DETECTED Final   Klebsiella oxytoca NOT DETECTED NOT DETECTED Final   Klebsiella pneumoniae NOT DETECTED NOT DETECTED Final   Proteus species NOT DETECTED NOT DETECTED Final   Salmonella species NOT DETECTED NOT DETECTED Final   Serratia marcescens NOT DETECTED NOT DETECTED Final   Haemophilus influenzae NOT DETECTED NOT DETECTED Final   Neisseria meningitidis NOT DETECTED NOT DETECTED Final   Pseudomonas aeruginosa NOT DETECTED NOT DETECTED Final   Stenotrophomonas maltophilia NOT DETECTED NOT DETECTED Final   Candida albicans NOT DETECTED NOT DETECTED Final    Candida auris NOT DETECTED NOT DETECTED Final   Candida glabrata NOT DETECTED NOT DETECTED Final   Candida krusei NOT DETECTED NOT DETECTED Final   Candida parapsilosis NOT DETECTED NOT DETECTED Final   Candida tropicalis NOT DETECTED NOT DETECTED Final   Cryptococcus neoformans/gattii NOT DETECTED NOT DETECTED Final   Meth resistant mecA/C and MREJ DETECTED (A) NOT DETECTED Final    Comment: CRITICAL RESULT CALLED TO, READ BACK BY AND VERIFIED WITHErling Cruz [facility] 03/25/21 2301 JDW Performed at [facility], [address], [place], [place] [address]   SARS CORONAVIRUS 2 (TAT 6-24 HRS) Nasopharyngeal Nasopharyngeal Swab     Status: None   Collection Time: 03/25/21  5:15 AM   Specimen: Nasopharyngeal Swab  Result Value Ref Range Status   SARS Coronavirus 2 NEGATIVE NEGATIVE Final    Comment: (NOTE) SARS-CoV-2 target nucleic acids are NOT DETECTED.  The SARS-CoV-2 RNA is generally detectable in upper and lower respiratory specimens during the acute phase of infection. Negative results do not preclude SARS-CoV-2 infection, do not rule out co-infections with other pathogens, and should not be used as the sole basis for treatment or other patient management decisions. Negative results must be combined with clinical observations, patient history, and epidemiological information. The expected result is Negative.  Fact Sheet for Patients: HairSlick.nohttps://www.fda.gov/media/138098/download  Fact Sheet for Healthcare Providers: quierodirigir.comhttps://www.fda.gov/media/138095/download  This test is not yet approved or cleared by the [place] FDA and  has been authorized for detection and/or diagnosis of SARS-CoV-2 by FDA under an Emergency Use Authorization (EUA). This EUA will remain  in effect (meaning this test can be used) for the duration of the COVID-19 declaration under Se ction 564(b)(1) of the Act, 21 U.S.C. section 360bbb-3(b)(1), unless the authorization is terminated or revoked  sooner.  Performed at [facility], [address] [place], [place] [address]   Blood culture (routine x 2)     Status: Abnormal   Collection Time: 03/25/21  5:49 AM   Specimen: BLOOD  Result Value Ref Range Status   Specimen Description   Final    BLOOD LEFT ARM Performed at [facility], [address] [place], [place] [address]    Special Requests   Final    BOTTLES DRAWN AEROBIC AND ANAEROBIC Blood Culture results may not be optimal due to an inadequate volume of blood received in culture bottles Performed at [facility], [address] [place], [place] [address]    Culture  Setup Time   Final    GRAM POSITIVE COCCI IN CLUSTERS IN BOTH AEROBIC AND ANAEROBIC BOTTLES IDENTIFICATION TO FOLLOW CRITICAL VALUE NOTED.  VALUE IS CONSISTENT WITH PREVIOUSLY REPORTED AND CALLED VALUE.    Culture (A)  Final    STAPHYLOCOCCUS AUREUS SUSCEPTIBILITIES PERFORMED ON PREVIOUS CULTURE WITHIN THE LAST 5 DAYS. Performed at [facility], [address] [place], [place] [address]    Report Status 03/27/2021 FINAL  Final    Studies/Results: ECHOCARDIOGRAM COMPLETE  Result Date: 03/26/2021    ECHOCARDIOGRAM REPORT   Patient Name:   Duane MetzHOMAS Duane Date of Exam: 03/26/2021 Medical Rec #:  [id number]     Height:       72.0 in Accession #:    [id number]    Weight:       160.0 lb Date of Birth:  1986/02/21     BSA:          1.938 m Patient Age:    34 years      BP:           100/60 mmHg Patient Gender: M             HR:           90 bpm. Exam Location:  Inpatient Procedure: 2D Echo, 3D Echo, Cardiac Doppler and Color Doppler                             MODIFIED REPORT: This report was modified by Olga MillersBrian Crenshaw MD on 03/26/2021 due to Change.  Indications:     Bacteremia  History:         Patient has no prior history of Echocardiogram examinations.  Signs/Symptoms:Chest Pain. Septic embolism. IVDU.  Sonographer:     Sheralyn Boatman RDCS Referring  Phys:  [id number] [facility] Diagnosing Phys: Olga Millers MD  Sonographer Comments: Image acquisition challenging due to uncooperative patient. Patient could not stop rolling back and forth in bed throughout exam. IMPRESSIONS  1. Large (1.9 x 1.5 cm) oscillating mass on TV consistent with vegetation. Findings discussed with Cipriano Bunker MD.  2. Left ventricular ejection fraction, by estimation, is 60 to 65%. The left ventricle has normal function. The left ventricle has no regional wall motion abnormalities. Left ventricular diastolic parameters were normal.  3. Right ventricular systolic function is normal. The right ventricular size is normal. There is normal pulmonary artery systolic pressure.  4. The mitral valve is normal in structure. Trivial mitral valve regurgitation. No evidence of mitral stenosis.  5. The aortic valve is tricuspid. Aortic valve regurgitation is not visualized. No aortic stenosis is present.  6. The inferior vena cava is normal in size with greater than 50% respiratory variability, suggesting right atrial pressure of 3 mmHg. FINDINGS  Left Ventricle: Left ventricular ejection fraction, by estimation, is 60 to 65%. The left ventricle has normal function. The left ventricle has no regional wall motion abnormalities. The left ventricular internal cavity size was normal in size. There is  no left ventricular hypertrophy. Left ventricular diastolic parameters were normal. Right Ventricle: The right ventricular size is normal. Right ventricular systolic function is normal. There is normal pulmonary artery systolic pressure. The tricuspid regurgitant velocity is 2.13 m/s, and with an assumed right atrial pressure of 3 mmHg,  the estimated right ventricular systolic pressure is 21.1 mmHg. Left Atrium: Left atrial size was normal in size. Right Atrium: Right atrial size was normal in size. Pericardium: Trivial pericardial effusion is present. Mitral Valve: The mitral valve is normal in  structure. Trivial mitral valve regurgitation. No evidence of mitral valve stenosis. Tricuspid Valve: The tricuspid valve is normal in structure. Tricuspid valve regurgitation is mild . No evidence of tricuspid stenosis. Aortic Valve: The aortic valve is tricuspid. Aortic valve regurgitation is not visualized. No aortic stenosis is present. Pulmonic Valve: The pulmonic valve was normal in structure. Pulmonic valve regurgitation is not visualized. No evidence of pulmonic stenosis. Aorta: The aortic root is normal in size and structure. Venous: The inferior vena cava is normal in size with greater than 50% respiratory variability, suggesting right atrial pressure of 3 mmHg. IAS/Shunts: No atrial level shunt detected by color flow Doppler. Additional Comments: Large (1.9 x 1.5 cm) oscillating mass on TV consistent with vegetation. Findings discussed with Cipriano Bunker MD.  LEFT VENTRICLE PLAX 2D LVIDd:         4.80 cm     Diastology LVIDs:         2.90 cm     LV e' medial:    11.20 cm/s LV PW:         1.20 cm     LV E/e' medial:  5.9 LV IVS:        1.00 cm     LV e' lateral:   12.40 cm/s LVOT diam:     2.30 cm     LV E/e' lateral: 5.4 LV SV:         73 LV SV Index:   38 LVOT Area:     4.15 cm  LV Volumes (MOD) LV vol d, MOD A2C: 87.5 ml LV vol s, MOD A2C: 31.8 ml LV SV MOD A2C:  55.7 ml RIGHT VENTRICLE             IVC RV S prime:     14.70 cm/s  IVC diam: 1.60 cm TAPSE (M-mode): 2.7 cm LEFT ATRIUM             Index       RIGHT ATRIUM           Index LA diam:        4.40 cm 2.27 cm/m  RA Area:     11.40 cm LA Vol (A2C):   32.1 ml 16.57 ml/m RA Volume:   23.50 ml  12.13 ml/m LA Vol (A4C):   20.5 ml 10.58 ml/m LA Biplane Vol: 27.0 ml 13.93 ml/m  AORTIC VALVE LVOT Vmax:   106.00 cm/s LVOT Vmean:  67.900 cm/s LVOT VTI:    0.176 m  AORTA Ao Root diam: 2.90 cm Ao Asc diam:  3.00 cm MITRAL VALVE               TRICUSPID VALVE MV Area (PHT): 3.42 cm    TR Peak grad:   18.1 mmHg MV Decel Time: 222 msec    TR Vmax:         213.00 cm/s MV E velocity: 66.35 cm/s MV A velocity: 57.20 cm/s  SHUNTS MV E/A ratio:  1.16        Systemic VTI:  0.18 m                            Systemic Diam: 2.30 cm Olga Millers MD Electronically signed by Olga Millers MD Signature Date/Time: 03/26/2021/12:56:55 PM    Final (Updated)      Assessment/Plan: Bacteremia, MRSA TV IE Septic Pulm embolism Hep C Ab+ IVDA  Total days of antibiotics: 2 vanco Cr stable on vanco No change in anbx Repeat BCx canceled this AM. Will re-order. Will check Hep C rna and genotype Appreciate CV eval- consider CVTS (curbside?) to see if he would need intervention (and therefore need TEE).          Johny Sax MD, FACP Infectious Diseases (pager) [phone] [email or web address] 03/27/2021, 11:21 AM  LOS: 2 days

## 2021-03-27 NOTE — Plan of Care (Signed)
  Problem: Education: Goal: Knowledge of General Education information will improve Description: Including pain rating scale, medication(s)/side effects and non-pharmacologic comfort measures Outcome: Completed/Met
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
## 2021-03-28 DIAGNOSIS — I330 Acute and subacute infective endocarditis: Secondary | ICD-10-CM

## 2021-03-28 DIAGNOSIS — B9562 Methicillin resistant Staphylococcus aureus infection as the cause of diseases classified elsewhere: Secondary | ICD-10-CM

## 2021-03-28 DIAGNOSIS — I2699 Other pulmonary embolism without acute cor pulmonale: Secondary | ICD-10-CM

## 2021-03-28 DIAGNOSIS — I339 Acute and subacute endocarditis, unspecified: Secondary | ICD-10-CM

## 2021-03-28 DIAGNOSIS — I361 Nonrheumatic tricuspid (valve) insufficiency: Secondary | ICD-10-CM

## 2021-03-28 DIAGNOSIS — R9389 Abnormal findings on diagnostic imaging of other specified body structures: Secondary | ICD-10-CM

## 2021-03-28 LAB — BASIC METABOLIC PANEL
Anion gap: 11 (ref 5–15)
BUN: 11 mg/dL (ref 6–20)
CO2: 26 mmol/L (ref 22–32)
Calcium: 8.6 mg/dL — ABNORMAL LOW (ref 8.9–10.3)
Chloride: 94 mmol/L — ABNORMAL LOW (ref 98–111)
Creatinine, Ser: 0.72 mg/dL (ref 0.61–1.24)
GFR, Estimated: >60 mL/min (ref >60)
Glucose, Bld: 148 mg/dL — ABNORMAL HIGH (ref 70–99)
Potassium: 3.8 mmol/L (ref 3.5–5.1)
Sodium: 131 mmol/L — ABNORMAL LOW (ref 135–145)

## 2021-03-28 LAB — TYPE AND SCREEN
ABO/RH(D): B NEG
Antibody Screen: NEGATIVE

## 2021-03-28 LAB — CBC
HCT: 33.4 % — ABNORMAL LOW (ref 39.0–52.0)
Hemoglobin: 11.8 g/dL — ABNORMAL LOW (ref 13.0–17.0)
MCH: 29.3 pg (ref 26.0–34.0)
MCHC: 35.3 g/dL (ref 30.0–36.0)
MCV: 82.9 fL (ref 80.0–100.0)
Platelets: 264 10*3/uL (ref 150–400)
RBC: 4.03 MIL/uL — ABNORMAL LOW (ref 4.22–5.81)
RDW: 12.9 % (ref 11.5–15.5)
WBC: 23.3 10*3/uL — ABNORMAL HIGH (ref 4.0–10.5)
nRBC: 0 % (ref 0.0–0.2)

## 2021-03-28 LAB — PHOSPHORUS: Phosphorus: 3.1 mg/dL (ref 2.5–4.6)

## 2021-03-28 LAB — VANCOMYCIN, RANDOM: Vancomycin Rm: 32

## 2021-03-28 LAB — ABO/RH: ABO/RH(D): B NEG

## 2021-03-28 LAB — MAGNESIUM: Magnesium: 1.8 mg/dL (ref 1.7–2.4)

## 2021-03-28 LAB — VANCOMYCIN, TROUGH: Vancomycin Tr: 10 ug/mL — ABNORMAL LOW (ref 15–20)

## 2021-03-28 LAB — HCV RNA QUANT RFLX ULTRA OR GENOTYP

## 2021-03-28 NOTE — Progress Notes (Signed)
Progress Note  Patient Name: Duane Price Date of Encounter: 03/28/2021  [facility] Cardiologist: None new- Dr Rennis Golden  Subjective   Complains of pain in chest - all over. Wants more pain medication  Inpatient Medications    Scheduled Meds:  enoxaparin (LOVENOX) injection  40 mg Subcutaneous Q24H   Continuous Infusions:  vancomycin 1,250 mg (03/27/21 2255)   PRN Meds: acetaminophen **OR** acetaminophen, oxyCODONE-acetaminophen, traZODone   Vital Signs    Vitals:   03/27/21 0536 03/27/21 1411 03/27/21 1917 03/28/21 0306  BP: 126/63 124/60 120/73 115/75  Pulse: 88 89 83 80  Resp: 18 16 20 18   Temp: 98.6 F (37 C) 98.6 F (37 C) 98.3 F (36.8 C) 99.5 F (37.5 C)  TempSrc: Oral  Oral Oral  SpO2: 100% 98% 99% 95%  Weight:   70.9 kg   Height:   6' (1.829 m)     Intake/Output Summary (Last 24 hours) at 03/28/2021 0747 Last data filed at 03/28/2021 0521 Gross per 24 hour  Intake --  Output 1500 ml  Net -1500 ml   Last 3 Weights 03/27/2021 03/25/2021 03/05/2019  Weight (lbs) 156 lb 4.8 oz 160 lb 174 lb 2.6 oz  Weight (kg) 70.897 kg 72.576 kg 79 kg      Telemetry    NSR rate 66. Some intermittent sinus tachy - Personally Reviewed  ECG    normal - Personally Reviewed  Physical Exam    GEN: No acute distress.   Neck: No JVD Cardiac: RRR, no murmurs, rubs, or gallops.  Respiratory: Clear to auscultation bilaterally. GI: Soft, nontender, non-distended  MS: No edema; No deformity. Neuro:  Nonfocal  Psych: Normal affect   Labs    High Sensitivity Troponin:   Recent Labs  Lab 03/25/21 1109 03/25/21 1400  TROPONINIHS 9 5      Chemistry Recent Labs  Lab 03/25/21 0507 03/25/21 0745 03/25/21 1109 03/26/21 0404 03/27/21 0712  NA 130* 131*  --  130* 131*  K 4.3 3.1*  --  3.2* 3.6  CL 89* 91*  --  90* 93*  CO2 25  --   --  26 27  GLUCOSE 139* 151*  --  169* 141*  BUN 24* 22*  --  27* 14  CREATININE 1.20 1.00 1.02 1.00 0.71  CALCIUM 9.0  --   --   8.8* 8.7*  PROT 8.2*  --   --  8.1  --   ALBUMIN 3.3*  --   --  2.8*  --   AST 23  --   --  18  --   ALT 12  --   --  12  --   ALKPHOS 153*  --   --  159*  --   BILITOT 1.2  --   --  1.1  --   GFRNONAA >60  --  >60 >60 >60  ANIONGAP 16*  --   --  14 11     Hematology Recent Labs  Lab 03/25/21 1109 03/26/21 0404 03/27/21 2006  WBC 26.6* 24.9* 23.4*  RBC 4.02*  3.92* 4.28 4.09*  HGB 11.8* 12.2* 12.0*  HCT 33.9* 35.8* 33.8*  MCV 84.3 83.6 82.6  MCH 29.4 28.5 29.3  MCHC 34.8 34.1 35.5  RDW 12.6 12.9 12.9  PLT 213 216 235    BNPNo results for input(s): BNP, PROBNP in the last 168 hours.   DDimer No results for input(s): DDIMER in the last 168 hours.   Radiology    ECHOCARDIOGRAM  COMPLETE  Result Date: 03/26/2021    ECHOCARDIOGRAM REPORT   Patient Name:   Duane Price Date of Exam: 03/26/2021 Medical Rec #:  [id number]     Height:       72.0 in Accession #:    [id number]    Weight:       160.0 lb Date of Birth:  Apr 25, 1986     BSA:          1.938 m Patient Age:    34 years      BP:           100/60 mmHg Patient Gender: M             HR:           90 bpm. Exam Location:  Inpatient Procedure: 2D Echo, 3D Echo, Cardiac Doppler and Color Doppler                             MODIFIED REPORT: This report was modified by Olga MillersBrian Crenshaw MD on 03/26/2021 due to Change.  Indications:     Bacteremia  History:         Patient has no prior history of Echocardiogram examinations.                  Signs/Symptoms:Chest Pain. Septic embolism. IVDU.  Sonographer:     Sheralyn Boatmanina West RDCS Referring Phys:  [id number] Meryle ReadyRSHAD N [facility] Diagnosing Phys: Olga MillersBrian Crenshaw MD  Sonographer Comments: Image acquisition challenging due to uncooperative patient. Patient could not stop rolling back and forth in bed throughout exam. IMPRESSIONS  1. Large (1.9 x 1.5 cm) oscillating mass on TV consistent with vegetation. Findings discussed with Cipriano BunkerPardeep Kumar MD.  2. Left ventricular ejection fraction, by estimation, is 60 to 65%. The  left ventricle has normal function. The left ventricle has no regional wall motion abnormalities. Left ventricular diastolic parameters were normal.  3. Right ventricular systolic function is normal. The right ventricular size is normal. There is normal pulmonary artery systolic pressure.  4. The mitral valve is normal in structure. Trivial mitral valve regurgitation. No evidence of mitral stenosis.  5. The aortic valve is tricuspid. Aortic valve regurgitation is not visualized. No aortic stenosis is present.  6. The inferior vena cava is normal in size with greater than 50% respiratory variability, suggesting right atrial pressure of 3 mmHg. FINDINGS  Left Ventricle: Left ventricular ejection fraction, by estimation, is 60 to 65%. The left ventricle has normal function. The left ventricle has no regional wall motion abnormalities. The left ventricular internal cavity size was normal in size. There is  no left ventricular hypertrophy. Left ventricular diastolic parameters were normal. Right Ventricle: The right ventricular size is normal. Right ventricular systolic function is normal. There is normal pulmonary artery systolic pressure. The tricuspid regurgitant velocity is 2.13 m/s, and with an assumed right atrial pressure of 3 mmHg,  the estimated right ventricular systolic pressure is 21.1 mmHg. Left Atrium: Left atrial size was normal in size. Right Atrium: Right atrial size was normal in size. Pericardium: Trivial pericardial effusion is present. Mitral Valve: The mitral valve is normal in structure. Trivial mitral valve regurgitation. No evidence of mitral valve stenosis. Tricuspid Valve: The tricuspid valve is normal in structure. Tricuspid valve regurgitation is mild . No evidence of tricuspid stenosis. Aortic Valve: The aortic valve is tricuspid. Aortic valve regurgitation is not visualized. No aortic stenosis is present. Pulmonic Valve: The pulmonic valve  was normal in structure. Pulmonic valve  regurgitation is not visualized. No evidence of pulmonic stenosis. Aorta: The aortic root is normal in size and structure. Venous: The inferior vena cava is normal in size with greater than 50% respiratory variability, suggesting right atrial pressure of 3 mmHg. IAS/Shunts: No atrial level shunt detected by color flow Doppler. Additional Comments: Large (1.9 x 1.5 cm) oscillating mass on TV consistent with vegetation. Findings discussed with Cipriano Bunker MD.  LEFT VENTRICLE PLAX 2D LVIDd:         4.80 cm     Diastology LVIDs:         2.90 cm     LV e' medial:    11.20 cm/s LV PW:         1.20 cm     LV E/e' medial:  5.9 LV IVS:        1.00 cm     LV e' lateral:   12.40 cm/s LVOT diam:     2.30 cm     LV E/e' lateral: 5.4 LV SV:         73 LV SV Index:   38 LVOT Area:     4.15 cm  LV Volumes (MOD) LV vol d, MOD A2C: 87.5 ml LV vol s, MOD A2C: 31.8 ml LV SV MOD A2C:     55.7 ml RIGHT VENTRICLE             IVC RV S prime:     14.70 cm/s  IVC diam: 1.60 cm TAPSE (M-mode): 2.7 cm LEFT ATRIUM             Index       RIGHT ATRIUM           Index LA diam:        4.40 cm 2.27 cm/m  RA Area:     11.40 cm LA Vol (A2C):   32.1 ml 16.57 ml/m RA Volume:   23.50 ml  12.13 ml/m LA Vol (A4C):   20.5 ml 10.58 ml/m LA Biplane Vol: 27.0 ml 13.93 ml/m  AORTIC VALVE LVOT Vmax:   106.00 cm/s LVOT Vmean:  67.900 cm/s LVOT VTI:    0.176 m  AORTA Ao Root diam: 2.90 cm Ao Asc diam:  3.00 cm MITRAL VALVE               TRICUSPID VALVE MV Area (PHT): 3.42 cm    TR Peak grad:   18.1 mmHg MV Decel Time: 222 msec    TR Vmax:        213.00 cm/s MV E velocity: 66.35 cm/s MV A velocity: 57.20 cm/s  SHUNTS MV E/A ratio:  1.16        Systemic VTI:  0.18 m                            Systemic Diam: 2.30 cm Olga Millers MD Electronically signed by Olga Millers MD Signature Date/Time: 03/26/2021/12:56:55 PM    Final (Updated)     Cardiac Studies   See above  Patient Profile     35 y.o. male who is being seen today for the evaluation of TV  vegetation at the request of Dr. Lucianne Muss. This is a 35 year old male with history of IV drug use who presented with 1 week of chest pain.  He was found to have signs of septic pulmonary emboli with a high white blood cell count.  Blood cultures of  grown 3 out of 4 bottles with MRSA.  Surface echocardiogram demonstrated a large tricuspid valve vegetation.  Cardiology is asked to further evaluate for this. His main complaint is chest pain - says he is not getting enough pain meds. Has been using heroin. Suspect pain is due to septic emboli.  Assessment & Plan    Tricuspid valve endocarditis with staph aureus. TEE is not absolutely necessary as the diagnosis is made, unless it changes management - there is mild TV insufficiency currently - t his will need serial follow up with Echo.  No evidence of perforation. AV and MV are spared. Mangement options are long-term antibiotics, probably unlikely to get open-heart surgery (there is large vegetation, however, no current evidence of valve destruction) - the other possibility is angiojet debulking- Would recommend discussion with CT surgery. We could pursue TEE if intervention is planned. Continue antibiotics per ID.       For questions or updates, please contact [facility] Please consult [email or web address] for contact info under        Signed, Delphin Funes [place], MD  03/28/2021, 7:47 AM

## 2021-03-28 NOTE — TOC Progression Note (Signed)
Transition of Care [facility]) - Progression Note    Patient Details  Name: Duane Price MRN: [id number] Date of Birth: 25-Nov-1985  Transition of Care [facility]) CM/SW [place], [place] Phone Number: 03/28/2021, 1:08 PM  Clinical Narrative:    CSW acknowledged consult for substance abuse resources. CSW met with pt at bedside. He was hard to arouse and then stated he was in too much pain to talk and asked if SW could come back another time. CSW left a resource packet and asked him to look it over and let TOC know if he had any questions. SW will follow for any additional needs.        Expected Discharge Plan and Services                                                 Social Determinants of Health (SDOH) Interventions    Readmission Risk Interventions No flowsheet data found.

## 2021-03-28 NOTE — Progress Notes (Signed)
Pharmacy Antibiotic Note  Duane Price is a 35 y.o. male admitted on 03/25/2021 with MRSA endocarditis w/ TV vegetation, septic emboli.  Pharmacy has been consulted for vancomycin.    Ordered vancomycin levels 7/4.  Plan: F/u vancomycin levels and adjust PRN Vancomycin 1,250mg  IV Q12h F/u LOT    Height: 6' (182.9 cm) Weight: 70.9 kg (156 lb 4.8 oz) IBW/kg (Calculated) : 77.6  Temp (24hrs), Avg:98.9 F (37.2 C), Min:98.3 F (36.8 C), Max:99.5 F (37.5 C)  Recent Labs  Lab 03/25/21 0507 03/25/21 0630 03/25/21 0641 03/25/21 0745 03/25/21 1109 03/26/21 0404 03/27/21 0712 03/27/21 2006  WBC  --   --   --  25.8* 26.6* 24.9*  --  23.4*  CREATININE 1.20  --   --  1.00 1.02 1.00 0.71  --   LATICACIDVEN  --  2.4* 2.1*  --   --   --   --   --      Estimated Creatinine Clearance: 132.9 mL/min (by C-G formula based on SCr of 0.71 mg/dL).    No Known Allergies  Antimicrobials this admission: Cefepime 7/1 >>7/2 Vancomycin 7/1 >>   Dose adjustments this admission:   Microbiology results: 7/1 Bcx 2/2: MRSA   7/3 Bcx    Alphia Moh, PharmD, BCPS, [facility] Clinical Pharmacist  Please check AMION for all [facility] phone numbers After 10:00 PM, call Main Pharmacy [phone]
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
## 2021-03-28 NOTE — Progress Notes (Signed)
Pharmacy Antibiotic Note  Duane Price is a 35 y.o. male admitted on 03/25/2021 with MRSA endocarditis w/ TV vegetation, septic emboli.  Pharmacy has been consulted for vancomycin.  Vanc peak 32, Vanc trough 10 Calculated AUC 530 (at goal) on 1250mg  IV q12h   Plan: Continue Vancomycin 1250mg  IV Q12h F/u LOT    Height: 6' (182.9 cm) Weight: 70.9 kg (156 lb 4.8 oz) IBW/kg (Calculated) : 77.6  Temp (24hrs), Avg:99.4 F (37.4 C), Min:99.2 F (37.3 C), Max:99.5 F (37.5 C)  Recent Labs  Lab 03/25/21 0507 03/25/21 0630 03/25/21 0641 03/25/21 0745 03/25/21 1109 03/26/21 0404 03/27/21 0712 03/27/21 2006 03/28/21 1158 03/28/21 2000  WBC  --   --   --  25.8* 26.6* 24.9*  --  23.4*  --   --   CREATININE 1.20  --   --  1.00 1.02 1.00 0.71  --   --   --   LATICACIDVEN  --  2.4* 2.1*  --   --   --   --   --   --   --   VANCOTROUGH  --   --   --   --   --   --   --   --   --  10*  VANCORANDOM  --   --   --   --   --   --   --   --  32  --      Estimated Creatinine Clearance: 132.9 mL/min (by C-G formula based on SCr of 0.71 mg/dL).    No Known Allergies  Antimicrobials this admission: Cefepime 7/1 >>7/2 Vancomycin 7/1 >>   Microbiology results: 7/1 Bcx 2/2: MRSA   7/3 Bcx   9/1, PharmD, BCPS Please see amion for complete clinical pharmacist phone list 03/28/2021 9:27 PM

## 2021-03-29 ENCOUNTER — Encounter (HOSPITAL_COMMUNITY): Payer: Self-pay | Admitting: Internal Medicine

## 2021-03-29 ENCOUNTER — Inpatient Hospital Stay (HOSPITAL_COMMUNITY): Payer: Self-pay

## 2021-03-29 ENCOUNTER — Inpatient Hospital Stay (HOSPITAL_COMMUNITY): Payer: Self-pay | Admitting: Anesthesiology

## 2021-03-29 ENCOUNTER — Encounter (HOSPITAL_COMMUNITY)
Admission: EM | Disposition: A | Discharge status: Home / Self Care | Payer: Self-pay | Source: Home / Self Care | Attending: Internal Medicine

## 2021-03-29 DIAGNOSIS — R7881 Bacteremia: Secondary | ICD-10-CM

## 2021-03-29 DIAGNOSIS — R768 Other specified abnormal immunological findings in serum: Secondary | ICD-10-CM

## 2021-03-29 DIAGNOSIS — I361 Nonrheumatic tricuspid (valve) insufficiency: Secondary | ICD-10-CM

## 2021-03-29 DIAGNOSIS — I339 Acute and subacute endocarditis, unspecified: Secondary | ICD-10-CM

## 2021-03-29 DIAGNOSIS — I330 Acute and subacute infective endocarditis: Secondary | ICD-10-CM

## 2021-03-29 HISTORY — PX: APPLICATION OF ANGIOVAC: SHX6777

## 2021-03-29 LAB — SURGICAL PCR SCREEN
MRSA, PCR: POSITIVE — AB
Staphylococcus aureus: POSITIVE — AB

## 2021-03-29 LAB — GC/CHLAMYDIA PROBE AMP (~~LOC~~) NOT AT ARMC
Chlamydia: NEGATIVE
Comment: NEGATIVE
Comment: NORMAL
Neisseria Gonorrhea: NEGATIVE

## 2021-03-29 LAB — CBC
HCT: 30.2 % — ABNORMAL LOW (ref 39.0–52.0)
Hemoglobin: 10.7 g/dL — ABNORMAL LOW (ref 13.0–17.0)
MCH: 29.5 pg (ref 26.0–34.0)
MCHC: 35.4 g/dL (ref 30.0–36.0)
MCV: 83.2 fL (ref 80.0–100.0)
Platelets: 252 10*3/uL (ref 150–400)
RBC: 3.63 MIL/uL — ABNORMAL LOW (ref 4.22–5.81)
RDW: 13.1 % (ref 11.5–15.5)
WBC: 27.5 10*3/uL — ABNORMAL HIGH (ref 4.0–10.5)
nRBC: 0 % (ref 0.0–0.2)

## 2021-03-29 LAB — QUANTIFERON-TB GOLD PLUS: QuantiFERON-TB Gold Plus: UNDETERMINED — AB

## 2021-03-29 LAB — QUANTIFERON-TB GOLD PLUS (RQFGPL)
QuantiFERON Mitogen Value: 0.02 IU/mL
QuantiFERON Nil Value: 0 IU/mL
QuantiFERON TB1 Ag Value: 0 IU/mL
QuantiFERON TB2 Ag Value: 0 IU/mL

## 2021-03-29 LAB — COMPREHENSIVE METABOLIC PANEL
ALT: 16 U/L (ref 0–44)
AST: 19 U/L (ref 15–41)
Albumin: 1.9 g/dL — ABNORMAL LOW (ref 3.5–5.0)
Alkaline Phosphatase: 85 U/L (ref 38–126)
Anion gap: 11 (ref 5–15)
BUN: 13 mg/dL (ref 6–20)
CO2: 23 mmol/L (ref 22–32)
Calcium: 8.5 mg/dL — ABNORMAL LOW (ref 8.9–10.3)
Chloride: 94 mmol/L — ABNORMAL LOW (ref 98–111)
Creatinine, Ser: 0.81 mg/dL (ref 0.61–1.24)
GFR, Estimated: >60 mL/min (ref >60)
Glucose, Bld: 175 mg/dL — ABNORMAL HIGH (ref 70–99)
Potassium: 3.8 mmol/L (ref 3.5–5.1)
Sodium: 128 mmol/L — ABNORMAL LOW (ref 135–145)
Total Bilirubin: 0.6 mg/dL (ref 0.3–1.2)
Total Protein: 7 g/dL (ref 6.5–8.1)

## 2021-03-29 SURGERY — APPLICATION OF ANGIOVAC
Anesthesia: General

## 2021-03-29 MED ORDER — ROCURONIUM BROMIDE 10 MG/ML (PF) SYRINGE
PREFILLED_SYRINGE | INTRAVENOUS | Status: AC
  Filled 2021-03-29: qty 10

## 2021-03-29 MED ORDER — CHLORHEXIDINE GLUCONATE 0.12 % MT SOLN: 15.0000 mL | Freq: Once | OROMUCOSAL | Status: AC

## 2021-03-29 MED ORDER — FENTANYL CITRATE (PF) 250 MCG/5ML IJ SOLN
INTRAMUSCULAR | Status: DC | PRN
  Administered 2021-03-29 (×4): 50 ug via INTRAVENOUS
  Administered 2021-03-29: 100 ug via INTRAVENOUS
  Administered 2021-03-29: 50 ug via INTRAVENOUS

## 2021-03-29 MED ORDER — PROPOFOL 10 MG/ML IV BOLUS
INTRAVENOUS | Status: DC | PRN
  Administered 2021-03-29: 170 mg via INTRAVENOUS

## 2021-03-29 MED ORDER — HEPARIN 6000 UNIT IRRIGATION SOLUTION
Status: AC
  Filled 2021-03-29: qty 500

## 2021-03-29 MED ORDER — PHENYLEPHRINE 40 MCG/ML (10ML) SYRINGE FOR IV PUSH (FOR BLOOD PRESSURE SUPPORT)
PREFILLED_SYRINGE | INTRAVENOUS | Status: DC | PRN
  Administered 2021-03-29: 80 ug via INTRAVENOUS
  Administered 2021-03-29 (×4): 160 ug via INTRAVENOUS

## 2021-03-29 MED ORDER — OXYCODONE HCL 5 MG/5ML PO SOLN: 5.0000 mg | Freq: Once | ORAL | Status: DC | PRN

## 2021-03-29 MED ORDER — ACETAMINOPHEN 160 MG/5ML PO SOLN: 1000.0000 mg | Freq: Once | ORAL | Status: DC | PRN

## 2021-03-29 MED ORDER — MIDAZOLAM HCL 5 MG/5ML IJ SOLN
INTRAMUSCULAR | Status: DC | PRN
  Administered 2021-03-29 (×2): 1 mg via INTRAVENOUS

## 2021-03-29 MED ORDER — LIDOCAINE 2% (20 MG/ML) 5 ML SYRINGE
INTRAMUSCULAR | Status: AC
  Filled 2021-03-29: qty 5

## 2021-03-29 MED ORDER — DEXAMETHASONE SODIUM PHOSPHATE 10 MG/ML IJ SOLN
INTRAMUSCULAR | Status: DC | PRN
  Administered 2021-03-29: 5 mg via INTRAVENOUS

## 2021-03-29 MED ORDER — 0.9 % SODIUM CHLORIDE (POUR BTL) OPTIME
TOPICAL | Status: DC | PRN
  Administered 2021-03-29: 2000 mL

## 2021-03-29 MED ORDER — MIDAZOLAM HCL 2 MG/2ML IJ SOLN
INTRAMUSCULAR | Status: AC
  Filled 2021-03-29: qty 2

## 2021-03-29 MED ORDER — OXYCODONE HCL 5 MG PO TABS: 5.0000 mg | ORAL_TABLET | Freq: Once | ORAL | Status: DC | PRN

## 2021-03-29 MED ORDER — CHLORHEXIDINE GLUCONATE CLOTH 2 % EX PADS: 6.0000 | MEDICATED_PAD | Freq: Every day | CUTANEOUS | Status: AC

## 2021-03-29 MED ORDER — ACETAMINOPHEN 10 MG/ML IV SOLN: 1000.0000 mg | Freq: Once | INTRAVENOUS | Status: DC | PRN

## 2021-03-29 MED ORDER — FENTANYL CITRATE (PF) 100 MCG/2ML IJ SOLN
INTRAMUSCULAR | Status: AC
  Filled 2021-03-29: qty 2

## 2021-03-29 MED ORDER — HEPARIN SODIUM (PORCINE) 1000 UNIT/ML IJ SOLN
INTRAMUSCULAR | Status: DC | PRN
  Administered 2021-03-29: 10000 [IU] via INTRAVENOUS

## 2021-03-29 MED ORDER — BUPIVACAINE HCL (PF) 0.5 % IJ SOLN
INTRAMUSCULAR | Status: AC
  Filled 2021-03-29: qty 30

## 2021-03-29 MED ORDER — PROTAMINE SULFATE 10 MG/ML IV SOLN
INTRAVENOUS | Status: DC | PRN
  Administered 2021-03-29: 10 mg via INTRAVENOUS

## 2021-03-29 MED ORDER — FENTANYL CITRATE (PF) 250 MCG/5ML IJ SOLN
INTRAMUSCULAR | Status: AC
  Filled 2021-03-29: qty 5

## 2021-03-29 MED ORDER — ORAL CARE MOUTH RINSE: 15.0000 mL | Freq: Once | OROMUCOSAL | Status: AC

## 2021-03-29 MED ORDER — ROCURONIUM BROMIDE 10 MG/ML (PF) SYRINGE
PREFILLED_SYRINGE | INTRAVENOUS | Status: DC | PRN
  Administered 2021-03-29: 80 mg via INTRAVENOUS
  Administered 2021-03-29: 20 mg via INTRAVENOUS

## 2021-03-29 MED ORDER — SODIUM CHLORIDE 0.9 % IV SOLN: INTRAVENOUS | Status: DC

## 2021-03-29 MED ORDER — SUGAMMADEX SODIUM 200 MG/2ML IV SOLN
INTRAVENOUS | Status: DC | PRN
  Administered 2021-03-29: 200 mg via INTRAVENOUS

## 2021-03-29 MED ORDER — HEPARIN 6000 UNIT IRRIGATION SOLUTION
Status: DC | PRN
  Administered 2021-03-29: 1

## 2021-03-29 MED ORDER — PHENYLEPHRINE HCL (PRESSORS) 10 MG/ML IV SOLN
INTRAVENOUS | Status: AC
  Filled 2021-03-29: qty 1

## 2021-03-29 MED ORDER — ONDANSETRON HCL 4 MG/2ML IJ SOLN
INTRAMUSCULAR | Status: DC | PRN
  Administered 2021-03-29: 4 mg via INTRAVENOUS

## 2021-03-29 MED ORDER — ACETAMINOPHEN 500 MG PO TABS: 1000.0000 mg | ORAL_TABLET | Freq: Once | ORAL | Status: DC | PRN

## 2021-03-29 MED ORDER — PROPOFOL 10 MG/ML IV BOLUS
INTRAVENOUS | Status: AC
  Filled 2021-03-29: qty 20

## 2021-03-29 MED ORDER — LACTATED RINGERS IV SOLN: INTRAVENOUS | Status: DC

## 2021-03-29 MED ORDER — LIDOCAINE 2% (20 MG/ML) 5 ML SYRINGE
INTRAMUSCULAR | Status: DC | PRN
  Administered 2021-03-29: 60 mg via INTRAVENOUS

## 2021-03-29 MED ORDER — CHLORHEXIDINE GLUCONATE 0.12 % MT SOLN
OROMUCOSAL | Status: AC
  Administered 2021-03-29: 15 mL via OROMUCOSAL
  Filled 2021-03-29: qty 15

## 2021-03-29 MED ORDER — MINERAL OIL LIGHT 100 % EX OIL
TOPICAL_OIL | CUTANEOUS | Status: DC | PRN
  Administered 2021-03-29: 1 via TOPICAL

## 2021-03-29 MED ORDER — MUPIROCIN 2 % EX OINT
1.0000 "application " | TOPICAL_OINTMENT | Freq: Two times a day (BID) | CUTANEOUS | Status: AC
  Administered 2021-03-29 – 2021-04-02 (×10): 1 via NASAL
  Filled 2021-03-29 (×2): qty 22

## 2021-03-29 MED ORDER — CHLORHEXIDINE GLUCONATE CLOTH 2 % EX PADS
6.0000 | MEDICATED_PAD | Freq: Every day | CUTANEOUS | Status: AC
  Administered 2021-03-29 – 2021-03-31 (×3): 6 via TOPICAL

## 2021-03-29 MED ORDER — CHLORHEXIDINE GLUCONATE CLOTH 2 % EX PADS: 6.0000 | MEDICATED_PAD | Freq: Every day | CUTANEOUS | Status: DC

## 2021-03-29 MED ORDER — FENTANYL CITRATE (PF) 100 MCG/2ML IJ SOLN
25.0000 ug | INTRAMUSCULAR | Status: DC | PRN
  Administered 2021-03-29 (×2): 50 ug via INTRAVENOUS

## 2021-03-29 SURGICAL SUPPLY — 68 items
ADH SKN CLS APL DERMABOND .7 (GAUZE/BANDAGES/DRESSINGS)
ANGIOVAC CIRCUIT GEN3 (MISCELLANEOUS)
APL PRP STRL LF DISP 70% ISPRP (MISCELLANEOUS) ×1
BAG BANDED W/RUBBER/TAPE 36X54 (MISCELLANEOUS) IMPLANT
BAG EQP BAND 135X91 W/RBR TAPE (MISCELLANEOUS)
CANISTER SUCT 3000ML PPV (MISCELLANEOUS) ×2 IMPLANT
CANNULA ANGIOVAC F18 (CANNULA) ×1 IMPLANT
CANNULA C180 ANGIOVAC 180 DEG (CANNULA) IMPLANT
CANNULA C20 ANGIOVAC 20 DEG (CANNULA) IMPLANT
CANNULA OPTISITE PERFUSION 16F (CANNULA) IMPLANT
CANNULA OPTISITE PERFUSION 18F (CANNULA) ×1 IMPLANT
CANNULA OPTISITE PERFUSION 20F (CANNULA) IMPLANT
CATH ROBINSON RED A/P 18FR (CATHETERS) ×2 IMPLANT
CHLORAPREP W/TINT 26 (MISCELLANEOUS) ×2 IMPLANT
CLIP VESOCCLUDE MED 24/CT (CLIP) IMPLANT
CLIP VESOCCLUDE SM WIDE 24/CT (CLIP) IMPLANT
CONT SPEC 4OZ CLIKSEAL STRL BL (MISCELLANEOUS) ×1 IMPLANT
COVER PROBE W GEL 5X96 (DRAPES) ×2 IMPLANT
COVER SURGICAL LIGHT HANDLE (MISCELLANEOUS) ×2 IMPLANT
DERMABOND ADVANCED (GAUZE/BANDAGES/DRESSINGS)
DERMABOND ADVANCED .7 DNX12 (GAUZE/BANDAGES/DRESSINGS) IMPLANT
DRAPE C-ARM 42X72 X-RAY (DRAPES) ×1 IMPLANT
DRAPE INCISE IOBAN 66X45 STRL (DRAPES) ×3 IMPLANT
DRYSEAL FLEXSHEATH 18FR 33CM (SHEATH)
DRYSEAL FLEXSHEATH 22FR 33CM (SHEATH) ×1
DRYSEAL FLEXSHEATH 26FR 33CM (SHEATH)
ELECT REM PT RETURN 9FT ADLT (ELECTROSURGICAL) ×2
ELECTRODE REM PT RTRN 9FT ADLT (ELECTROSURGICAL) ×1 IMPLANT
FELT TEFLON 1X6 (MISCELLANEOUS) ×2 IMPLANT
GAUZE 4X4 16PLY ~~LOC~~+RFID DBL (SPONGE) ×1 IMPLANT
GAUZE SPONGE 4X4 12PLY STRL (GAUZE/BANDAGES/DRESSINGS) ×2 IMPLANT
GLOVE SURG ENC MOIS LTX SZ7 (GLOVE) ×2 IMPLANT
GLOVE SURG UNDER POLY LF SZ6 (GLOVE) ×2 IMPLANT
GOWN STRL REUS W/ TWL LRG LVL3 (GOWN DISPOSABLE) ×2 IMPLANT
GOWN STRL REUS W/ TWL XL LVL3 (GOWN DISPOSABLE) ×1 IMPLANT
GOWN STRL REUS W/TWL LRG LVL3 (GOWN DISPOSABLE) ×4
GOWN STRL REUS W/TWL XL LVL3 (GOWN DISPOSABLE) ×2
KIT BASIN OR (CUSTOM PROCEDURE TRAY) ×2 IMPLANT
KIT DILATOR VASC 18G NDL (KITS) ×3 IMPLANT
KIT MICROPUNCTURE NIT STIFF (SHEATH) ×1 IMPLANT
KIT TURNOVER KIT B (KITS) IMPLANT
NDL HYPO 25GX1X1/2 BEV (NEEDLE) IMPLANT
NEEDLE HYPO 25GX1X1/2 BEV (NEEDLE) IMPLANT
PACK CIRCUIT ANGIOVAC GEN3 (MISCELLANEOUS) IMPLANT
PACK ENDO MINOR (CUSTOM PROCEDURE TRAY) ×2 IMPLANT
PACK UNIVERSAL I (CUSTOM PROCEDURE TRAY) ×2 IMPLANT
PAD ARMBOARD 7.5X6 YLW CONV (MISCELLANEOUS) ×4 IMPLANT
POSITIONER HEAD DONUT 9IN (MISCELLANEOUS) ×2 IMPLANT
PUMP SARN DELFIN (MISCELLANEOUS) ×1 IMPLANT
SET MICROPUNCTURE 5F STIFF (MISCELLANEOUS) IMPLANT
SHEATH DRYSEAL FLEX 18FR 33CM (SHEATH) IMPLANT
SHEATH DRYSEAL FLEX 22FR 33CM (SHEATH) IMPLANT
SHEATH DRYSEAL FLEX 26FR 33CM (SHEATH) IMPLANT
SHEATH PINNACLE 8F 10CM (SHEATH) ×6 IMPLANT
SPONGE T-LAP 18X18 ~~LOC~~+RFID (SPONGE) ×2 IMPLANT
STOPCOCK 4 WAY LG BORE MALE ST (IV SETS) IMPLANT
SUT ETHIBOND X763 2 0 SH 1 (SUTURE) ×4 IMPLANT
SUT PROLENE 6 0 BV (SUTURE) IMPLANT
SUT SILK  1 MH (SUTURE) ×4
SUT SILK 1 MH (SUTURE) ×2 IMPLANT
SYR CONTROL 10ML LL (SYRINGE) IMPLANT
TAPE CLOTH SURG 4X10 WHT LF (GAUZE/BANDAGES/DRESSINGS) ×1 IMPLANT
TAPE PAPER 3X10 WHT MICROPORE (GAUZE/BANDAGES/DRESSINGS) ×1 IMPLANT
TOWEL GREEN STERILE (TOWEL DISPOSABLE) ×4 IMPLANT
TRAY FOLEY SLVR 14FR TEMP STAT (SET/KITS/TRAYS/PACK) IMPLANT
TRAY FOLEY SLVR 16FR TEMP STAT (SET/KITS/TRAYS/PACK) IMPLANT
WATER STERILE IRR 1000ML POUR (IV SOLUTION) ×2 IMPLANT
WIRE AMPLATZ SS-J .035X180CM (WIRE) ×3 IMPLANT
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
## 2021-03-29 NOTE — Anesthesia Procedure Notes (Signed)
Procedure Name: Intubation Date/Time: 03/29/2021 3:16 PM Performed by: Rande Brunt, CRNA Pre-anesthesia Checklist: Patient identified, Emergency Drugs available, Suction available and Patient being monitored Patient Re-evaluated:Patient Re-evaluated prior to induction Oxygen Delivery Method: Circle System Utilized Preoxygenation: Pre-oxygenation with 100% oxygen Induction Type: IV induction Ventilation: Mask ventilation without difficulty Laryngoscope Size: Mac and 4 Grade View: Grade II Tube type: Oral Tube size: 7.5 mm Number of attempts: 1 Airway Equipment and Method: Stylet and Oral airway Placement Confirmation: ETT inserted through vocal cords under direct vision, positive ETCO2 and breath sounds checked- equal and bilateral Secured at: 23 cm Tube secured with: Tape Dental Injury: Teeth and Oropharynx as per pre-operative assessment  Comments: Inserted by Paulina Fusi, SRNA

## 2021-03-29 NOTE — Op Note (Signed)
      [address]       [place] [address]             [phone]          07/23/2019   Patient: Duane Price Pre-Op Dx:     Tricuspid valve endocarditis   MRSA bacteremia                         Sepsis                         Septic pulmonary emboli   Post-op Dx:  same Procedure: -Right femoral vein cannulation with a 18 F cannula - Right internal jugular vein cannulation with a 2F Sheath - Right heart cannulation - Debridement of right atrial mass - Debridement of tricuspid valve vegetation   Surgeon and Role:      * Katena Petitjean, Eliezer Lofts, MD - Primary    Webb Laws, PA-C - assisting Anesthesia  general EBL: 50 ml Blood Administration: None Specimen:  Tricuspid valve vegetation   Indications: [name] was admitted to the hospital with tricuspid valve endocarditis and MRSA bacteremia.  Due to ongoing drug use, the patient was not a good surgical candidate for valve replacement.  The patient did however have a large tricuspid valve vegetation and evidence of multiple septic pulmonary emboli.  Catheter-based debridement of the tricuspid valve vegetation was recommended.   Findings: We were able to engage the tricuspid valve.  We were able to debride all the mobile components of the tricuspid valve.  The tricuspid valve regurgitation remained unchanged.  It appears that most of it is coming from the perforation of the anterior leaflet.   Operative Technique: After the risks, benefits and alternatives were thoroughly discussed, the patient was brought to the operative theatre.  Anesthesia was induced, and she was prepped and draped in normal sterile fashion.  An appropriate surgical pause was performed and preoperative antibiotics were dosed accordingly.   We began with ultrasound-guided cannulation of the right femoral vein using a micropuncture set.  We confirmed that our wire was in the IVC using fluoroscopy.  After systemically heparinizing the  patient, the venotomy was then sequentially dilated over wire and our 19 French catheter was in place.  This catheter was then connected to the angiovac circuit.   Next we moved to the right internal jugular vein.  Using ultrasound guidance and a micropuncture set we accessed the vein.  Wire was then threaded down the SVC into the heart and down into the abdominal IVC.  The tract was then dilated sequentially using fluoroscopy.  In the 22 [place] dry seal sheath was then inserted.  After we confirmed therapeutic ACT, the ECMO circuit was initiated and we used the Angiovac to debride the tricuspid valve with TEE guidance.   After achieving an optimal result we discontinued our procedure, and returned the remaining blood from the Angiovac circuit to the patient.  The catheters were removed and the sites were closed with a pledgeted mattress suture.  Pressure was held while heparinization was reversed with protamine.   The patient tolerated the procedure without any immediate complications, and was transferred to the PACU in stable condition.   Sebastian Lurz Keane Scrape

## 2021-03-29 NOTE — Anesthesia Preprocedure Evaluation (Signed)
Anesthesia Evaluation  Patient identified by MRN, date of birth, ID band Patient awake    Reviewed: Allergy & Precautions, NPO status , Patient's Chart, lab work & pertinent test results  History of Anesthesia Complications Negative for: history of anesthetic complications  Airway Mallampati: II  TM Distance: >3 FB Neck ROM: Full    Dental  (+) Dental Advisory Given   Pulmonary neg pulmonary ROS, neg shortness of breath, neg sleep apnea, neg COPD, neg recent URI, Current Smoker and Patient abstained from smoking.,    breath sounds clear to auscultation       Cardiovascular + Valvular Problems/Murmurs  Rhythm:Regular  1. Large (1.9 x 1.5 cm) oscillating mass on TV consistent with  vegetation. Findings discussed with Cipriano Bunker MD.  2. Left ventricular ejection fraction, by estimation, is 60 to 65%. The  left ventricle has normal function. The left ventricle has no regional  wall motion abnormalities. Left ventricular diastolic parameters were  normal.  3. Right ventricular systolic function is normal. The right ventricular  size is normal. There is normal pulmonary artery systolic pressure.  4. The mitral valve is normal in structure. Trivial mitral valve  regurgitation. No evidence of mitral stenosis.  5. The aortic valve is tricuspid. Aortic valve regurgitation is not  visualized. No aortic stenosis is present.  6. The inferior vena cava is normal in size with greater than 50%  respiratory variability, suggesting right atrial pressure of 3 mmHg.    Neuro/Psych negative neurological ROS  negative psych ROS   GI/Hepatic negative GI ROS, (+)     substance abuse  , Hepatitis -, C  Endo/Other    Renal/GU Lab Results      Component                Value               Date                      CREATININE               0.81                03/29/2021                Musculoskeletal negative musculoskeletal ROS (+)    Abdominal   Peds  Hematology  (+) Blood dyscrasia, , Lab Results      Component                Value               Date                      WBC                      27.5 (H)            03/29/2021                HGB                      10.7 (L)            03/29/2021                HCT                      30.2 (L)  03/29/2021                MCV                      83.2                03/29/2021                PLT                      252                 03/29/2021              Anesthesia Other Findings   Reproductive/Obstetrics                             Anesthesia Physical Anesthesia Plan  ASA: 4  Anesthesia Plan: General   Post-op Pain Management:    Induction: Intravenous  PONV Risk Score and Plan: 1 and Ondansetron and Dexamethasone  Airway Management Planned: Oral ETT  Additional Equipment: Arterial line, CVP and Ultrasound Guidance Line Placement  Intra-op Plan:   Post-operative Plan: Extubation in OR and Possible Post-op intubation/ventilation  Informed Consent: I have reviewed the patients History and Physical, chart, labs and discussed the procedure including the risks, benefits and alternatives for the proposed anesthesia with the patient or authorized representative who has indicated his/her understanding and acceptance.     Dental advisory given  Plan Discussed with: CRNA and Surgeon  Anesthesia Plan Comments:         Anesthesia Quick Evaluation

## 2021-03-29 NOTE — Brief Op Note (Signed)
03/29/2021  4:46 PM  PATIENT:  Duane Price  35 y.o. male  PRE-OPERATIVE DIAGNOSIS:  Tricuspid valve endocarditis  POST-OPERATIVE DIAGNOSIS:  Tricuspid valve endocarditis  PROCEDURE:  Procedure(s): APPLICATION OF ANGIOVAC (N/A)  SURGEON:  Surgeon(s) and Role:    * Lightfoot, Eliezer Lofts, MD - Primary  PHYSICIAN ASSISTANT: Rylen Hou PA-C  ASSISTANTS: STAFF   ANESTHESIA:   general  EBL:  50 CC  BLOOD ADMINISTERED:none  DRAINS: none   LOCAL MEDICATIONS USED:  NONE  SPECIMEN:  Source of Specimen:  TRICUSPID ENDOCARDITIS  DISPOSITION OF SPECIMEN:   MICRO  COUNTS:  YES  TOURNIQUET:  * No tourniquets in log *  DICTATION: .Dragon Dictation  PLAN OF CARE: Admit to inpatient   PATIENT DISPOSITION:  PACU - hemodynamically stable.   Delay start of Pharmacological VTE agent (>24hrs) due to surgical blood loss or risk of bleeding: no  COMPLICATIONS: NO KNOWN

## 2021-03-29 NOTE — Anesthesia Procedure Notes (Signed)
Arterial Line Insertion Start/End7/01/2021 1:40 PM, 03/29/2021 1:47 PM Performed by: Quentin Ore, CRNA  Patient location: Pre-op. Preanesthetic checklist: patient identified, IV checked, site marked, risks and benefits discussed, surgical consent, monitors and equipment checked, pre-op evaluation, timeout performed and anesthesia consent Lidocaine 1% used for infiltration Right, radial was placed Catheter size: 20 G Hand hygiene performed  and maximum sterile barriers used   Attempts: 1 Procedure performed without using ultrasound guided technique. Following insertion, dressing applied and Biopatch. Patient tolerated the procedure well with no immediate complications.

## 2021-03-29 NOTE — Progress Notes (Signed)
[facility]  Date of Admission:  03/25/2021           Reason for visit: Follow up on bacteremia and endocarditis  Current antibiotics: Vancomycin 7/1--present  ASSESSMENT:    MRSA bacteremia: Blood cultures positive from admission on 03/25/2021.  No repeat blood cultures obtained.  Complicated by tricuspid valve endocarditis and septic pulmonary emboli.  Planning for angio vac debridement by cardiothoracic surgery today.  Continues on vancomycin per pharmacy dosing. Hepatitis C antibody positive: LFTs normal, HCV RNA ordered, however, test not performed. Injection drug use Medication monitoring: Creatinine has been stable on vancomycin  PLAN:    Continue vancomycin per pharmacy Follow-up repeat blood cultures obtained today Appreciate CT surgery evaluation Will reorder HCV RNA for the morning Will follow   Principal Problem:   MRSA bacteremia Active Problems:   Septic embolism (HCC)   Normocytic anemia   IV drug abuse (HCC)   Endocarditis of tricuspid valve   HCV antibody positive    MEDICATIONS:    Scheduled Meds:  [COMPLETED] Chlorhexidine Gluconate Cloth  6 each Topical Q0600   Chlorhexidine Gluconate Cloth  6 each Topical Q0600   enoxaparin (LOVENOX) injection  40 mg Subcutaneous Q24H   mupirocin ointment  1 application Nasal BID   Continuous Infusions:  vancomycin 167 mL/hr at 03/29/21 1125   PRN Meds:.acetaminophen **OR** acetaminophen, oxyCODONE-acetaminophen, traZODone  SUBJECTIVE:    No acute events noted No new complaints Complains of chest pain that is stable Going for procedure later today Asking about how long he will be in the hospital  Review of Systems  All other systems reviewed and are negative.    OBJECTIVE:   Blood pressure (!) 100/57, pulse 84, temperature (!) 97.5 F (36.4 C), temperature source Oral, resp. rate 18, height 6' (1.829 m), weight 70.9 kg, SpO2 93 %. Body mass index is 21.2  kg/m.  Physical Exam Constitutional:      General: He is not in acute distress.    Appearance: Normal appearance.     Comments: Nurse at the bedside drawing blood work  HENT:     Head: Normocephalic and atraumatic.  Pulmonary:     Effort: Pulmonary effort is normal. No respiratory distress.  Musculoskeletal:        General: Normal range of motion.  Neurological:     General: No focal deficit present.     Mental Status: He is alert and oriented to person, place, and time.  Psychiatric:        Mood and Affect: Mood normal.        Behavior: Behavior normal.     Lab Results: Lab Results  Component Value Date   WBC 27.5 (H) 03/29/2021   HGB 10.7 (L) 03/29/2021   HCT 30.2 (L) 03/29/2021   MCV 83.2 03/29/2021   PLT 252 03/29/2021    Lab Results  Component Value Date   NA 128 (L) 03/29/2021   K 3.8 03/29/2021   CO2 23 03/29/2021   GLUCOSE 175 (H) 03/29/2021   BUN 13 03/29/2021   CREATININE 0.81 03/29/2021   CALCIUM 8.5 (L) 03/29/2021   GFRNONAA >60 03/29/2021   GFRAA >60 01/29/2018    Lab Results  Component Value Date   ALT 16 03/29/2021   AST 19 03/29/2021   ALKPHOS 85 03/29/2021   BILITOT 0.6 03/29/2021       Component Value Date/Time   CRP 45.7 (H) 03/25/2021 0646       Component  Value Date/Time   ESRSEDRATE 88 (H) 03/25/2021 9169     I have reviewed the micro and lab results in Epic.  Imaging: No results found.   Imaging independently reviewed in Epic.    [facility] [phone] pager 03/29/2021, 11:48 AM  I spent greater than 35 minutes with the patient including greater than 50% of time in face to face counsel of the patient and in coordination of their care.

## 2021-03-29 NOTE — Progress Notes (Signed)
Progress Note  Patient Name: Duane Price Date of Encounter: 03/29/2021  [facility] Cardiologist: Hilty   Subjective  35 year old gentleman admitted March 25, 2021 with MRSA endocarditis with tricuspid valve vegetation, septic pulmonary emboli. Still having lots of pleuretic cp  Inpatient Medications    Scheduled Meds:  [COMPLETED] Chlorhexidine Gluconate Cloth  6 each Topical Q0600   Chlorhexidine Gluconate Cloth  6 each Topical Q0600   enoxaparin (LOVENOX) injection  40 mg Subcutaneous Q24H   mupirocin ointment  1 application Nasal BID   Continuous Infusions:  vancomycin 1,250 mg (03/29/21 0923)   PRN Meds: acetaminophen **OR** acetaminophen, oxyCODONE-acetaminophen, traZODone   Vital Signs    Vitals:   03/28/21 1100 03/28/21 2127 03/29/21 0435 03/29/21 0742  BP: 105/60 117/71 (!) 104/53 (!) 100/57  Pulse: 81 97 97 84  Resp: 20 19 18 18   Temp: 99.2 F (37.3 C) 99.1 F (37.3 C) 98.6 F (37 C) (!) 97.5 F (36.4 C)  TempSrc: Oral Oral Oral Oral  SpO2: 100% 97% 93%   Weight:      Height:        Intake/Output Summary (Last 24 hours) at 03/29/2021 1024 Last data filed at 03/28/2021 1100 Gross per 24 hour  Intake 240 ml  Output 425 ml  Net -185 ml   Last 3 Weights 03/27/2021 03/25/2021 03/05/2019  Weight (lbs) 156 lb 4.8 oz 160 lb 174 lb 2.6 oz  Weight (kg) 70.897 kg 72.576 kg 79 kg      Telemetry    NSR  - Personally Reviewed  ECG     - Personally Reviewed  Physical Exam   GEN: young gentleman,  mild distress,  appears somewhat incomfortable in bed   Neck: No JVD Cardiac: RRR, soft systolic murmur  Respiratory: Clear to auscultation bilaterally. GI: Soft, nontender, non-distended  MS: No edema; No deformity. Neuro:  Nonfocal  Psych: Normal affect   Labs    High Sensitivity Troponin:   Recent Labs  Lab 03/25/21 1109 03/25/21 1400  TROPONINIHS 9 5      Chemistry Recent Labs  Lab 03/25/21 0507 03/25/21 0745 03/26/21 0404 03/27/21 0712  03/28/21 0555 03/29/21 0250  NA 130*   < > 130* 131* 131* 128*  K 4.3   < > 3.2* 3.6 3.8 3.8  CL 89*   < > 90* 93* 94* 94*  CO2 25  --  26 27 26 23   GLUCOSE 139*   < > 169* 141* 148* 175*  BUN 24*   < > 27* 14 11 13   CREATININE 1.20   < > 1.00 0.71 0.72 0.81  CALCIUM 9.0  --  8.8* 8.7* 8.6* 8.5*  PROT 8.2*  --  8.1  --   --  7.0  ALBUMIN 3.3*  --  2.8*  --   --  1.9*  AST 23  --  18  --   --  19  ALT 12  --  12  --   --  16  ALKPHOS 153*  --  159*  --   --  85  BILITOT 1.2  --  1.1  --   --  0.6  GFRNONAA >60   < > >60 >60 >60 >60  ANIONGAP 16*  --  14 11 11 11    < > = values in this interval not displayed.     Hematology Recent Labs  Lab 03/27/21 2006 03/28/21 0555 03/29/21 0250  WBC 23.4* 23.3* 27.5*  RBC 4.09* 4.03* 3.63*  HGB 12.0* 11.8* 10.7*  HCT 33.8* 33.4* 30.2*  MCV 82.6 82.9 83.2  MCH 29.3 29.3 29.5  MCHC 35.5 35.3 35.4  RDW 12.9 12.9 13.1  PLT 235 264 252    BNPNo results for input(s): BNP, PROBNP in the last 168 hours.   DDimer No results for input(s): DDIMER in the last 168 hours.   Radiology    No results found.  Cardiac Studies      Patient Profile     35 y.o. male with hx of IVDA,  admitted with TV endocarditis and septic pulmonary emboli  Assessment & Plan     TV endocarditis:   for angio vac debridement by Dr. Cliffton Asters today  Following .          For questions or updates, please contact [facility] Please consult [email or web address] for contact info under        Signed, Kristeen Miss, MD  03/29/2021, 10:24 AM

## 2021-03-29 NOTE — Progress Notes (Signed)
Patient taken to OR via bed at 1250hrs.  Jewelery removed and placed in bag with patient label with belongings.

## 2021-03-29 NOTE — Progress Notes (Signed)
Patient returned from PACU at 1808hrs.  Reviewed post procedure plan of care with patient.  Dressing to right groin and right neck CDI.

## 2021-03-29 NOTE — Progress Notes (Signed)
     [address]       [place] [address]             [phone]       No events Vitals:   03/29/21 0435 03/29/21 0742  BP: (!) 104/53 (!) 100/57  Pulse: 97 84  Resp: 18 18  Temp: 98.6 F (37 C) (!) 97.5 F (36.4 C)  SpO2: 93%     OR today for Angiovac debridement of the tricuspid valve.  Milany Geck Keane Scrape

## 2021-03-29 NOTE — Transfer of Care (Signed)
Immediate Anesthesia Transfer of Care Note  Patient: Duane Price  Procedure(s) Performed: APPLICATION OF ANGIOVAC  Patient Location: PACU  Anesthesia Type:General  Level of Consciousness: awake  Airway & Oxygen Therapy: Patient Spontanous Breathing and Patient connected to face mask oxygen  Post-op Assessment: Report given to RN and Post -op Vital signs reviewed and stable  Post vital signs: Reviewed and stable  Last Vitals:  Vitals Value Taken Time  BP 136/70 03/29/21 1700  Temp 37.9 C 03/29/21 1700  Pulse 113 03/29/21 1700  Resp 25 03/29/21 1700  SpO2 98 % 03/29/21 1700  Vitals shown include unvalidated device data.  Last Pain:  Vitals:   03/29/21 1309  TempSrc:   PainSc: 10-Worst pain ever      Patients Stated Pain Goal: 0 (03/29/21 0742)  Complications: No notable events documented.

## 2021-03-29 NOTE — Anesthesia Procedure Notes (Signed)
Central Venous Catheter Insertion Performed by: Val Eagle, MD, anesthesiologist Start/End7/01/2021 3:17 PM, 03/29/2021 3:23 PM Patient location: OR. Preanesthetic checklist: patient identified, IV checked, site marked, risks and benefits discussed, surgical consent, monitors and equipment checked, pre-op evaluation, timeout performed and anesthesia consent Position: supine Hand hygiene performed  and maximum sterile barriers used  Catheter size: 8 Fr Total catheter length 16. Central line was placed.Double lumen Procedure performed using ultrasound guided technique. Ultrasound Notes:anatomy identified, needle tip was noted to be adjacent to the nerve/plexus identified, no ultrasound evidence of intravascular and/or intraneural injection and image(s) printed for medical record Attempts: 1 Following insertion, dressing applied, line sutured and Biopatch. Post procedure assessment: blood return through all ports and free fluid flow  Patient tolerated the procedure well with no immediate complications.

## 2021-03-30 ENCOUNTER — Encounter (HOSPITAL_COMMUNITY): Payer: Self-pay | Admitting: Thoracic Surgery (Cardiothoracic Vascular Surgery)

## 2021-03-30 LAB — BLOOD CULTURE ID PANEL (REFLEXED) - BCID2

## 2021-03-30 LAB — COMPREHENSIVE METABOLIC PANEL
ALT: 14 U/L (ref 0–44)
AST: 16 U/L (ref 15–41)
Albumin: 1.6 g/dL — ABNORMAL LOW (ref 3.5–5.0)
Alkaline Phosphatase: 70 U/L (ref 38–126)
Anion gap: 5 (ref 5–15)
BUN: 13 mg/dL (ref 6–20)
CO2: 26 mmol/L (ref 22–32)
Calcium: 7.9 mg/dL — ABNORMAL LOW (ref 8.9–10.3)
Chloride: 101 mmol/L (ref 98–111)
Creatinine, Ser: 0.72 mg/dL (ref 0.61–1.24)
GFR, Estimated: >60 mL/min (ref >60)
Glucose, Bld: 158 mg/dL — ABNORMAL HIGH (ref 70–99)
Potassium: 4.3 mmol/L (ref 3.5–5.1)
Sodium: 132 mmol/L — ABNORMAL LOW (ref 135–145)
Total Bilirubin: 0.5 mg/dL (ref 0.3–1.2)
Total Protein: 6.5 g/dL (ref 6.5–8.1)

## 2021-03-30 LAB — POCT ACTIVATED CLOTTING TIME
Activated Clotting Time: 302 seconds
Activated Clotting Time: 188 seconds

## 2021-03-30 LAB — CBC
HCT: 26.2 % — ABNORMAL LOW (ref 39.0–52.0)
Hemoglobin: 9.1 g/dL — ABNORMAL LOW (ref 13.0–17.0)
MCH: 29.4 pg (ref 26.0–34.0)
MCHC: 34.7 g/dL (ref 30.0–36.0)
MCV: 84.5 fL (ref 80.0–100.0)
Platelets: 244 10*3/uL (ref 150–400)
RBC: 3.1 MIL/uL — ABNORMAL LOW (ref 4.22–5.81)
RDW: 13.2 % (ref 11.5–15.5)
WBC: 26.2 10*3/uL — ABNORMAL HIGH (ref 4.0–10.5)
nRBC: 0 % (ref 0.0–0.2)

## 2021-03-30 MED ORDER — LORAZEPAM 1 MG PO TABS
1.0000 mg | ORAL_TABLET | Freq: Four times a day (QID) | ORAL | Status: DC | PRN
  Administered 2021-03-30 – 2021-04-16 (×23): 1 mg via ORAL
  Filled 2021-03-30 (×25): qty 1

## 2021-03-30 MED ORDER — HYDROMORPHONE HCL 2 MG PO TABS
2.0000 mg | ORAL_TABLET | ORAL | Status: DC | PRN
  Administered 2021-03-30 – 2021-04-06 (×25): 2 mg via ORAL
  Filled 2021-03-30 (×26): qty 1
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
## 2021-03-30 NOTE — Progress Notes (Signed)
[facility]  Date of Admission:  03/25/2021           Reason for visit: Follow up on bacteremia and endocarditis  Current antibiotics: Vancomycin 7/1--present  ASSESSMENT:    MRSA bacteremia: Admission blood cultures positive from 03/25/2021.  Repeat blood cultures obtained 03/29/2021 currently no growth to date.  His bacteremia is complicated by tricuspid valve endocarditis and septic pulmonary emboli status post angio vac procedure 03/29/2021.  Continues on vancomycin per pharmacy dosing. Hepatitis C antibody positive: LFTs normal and HCVRNA pending. Injection drug use: Discussed with patient that he would not be a PICC line candidate as an outpatient.  He is tentatively agreeable to staying for duration of IV antibiotics. Medication monitoring: Creatinine is stable on vancomycin.  PLAN:    Continue vancomycin per pharmacy dosing Follow-up pending cultures Follow-up HCV RNA to determine if he needs outpatient treatment Will continue to follow   Principal Problem:   MRSA bacteremia Active Problems:   Septic embolism (HCC)   Normocytic anemia   IV drug abuse (HCC)   Endocarditis of tricuspid valve   HCV antibody positive    MEDICATIONS:    Scheduled Meds:  Chlorhexidine Gluconate Cloth  6 each Topical Q0600   enoxaparin (LOVENOX) injection  40 mg Subcutaneous Q24H   mupirocin ointment  1 application Nasal BID   Continuous Infusions:  sodium chloride 75 mL/hr at 03/30/21 0421   vancomycin 1,250 mg (03/30/21 0920)   PRN Meds:.acetaminophen **OR** acetaminophen, oxyCODONE-acetaminophen, traZODone  SUBJECTIVE:   24 hour events:  Status post angio vac procedure yesterday with cardiothoracic surgery Afebrile Continues on vancomycin Repeat blood cultures currently no growth Cultures from his TV vegetation pending No new imaging HCV RNA obtained and pending  Patient seen this morning.  He has no new complaints.  He is tolerating  antibiotics thus far.  No fevers, chills, nausea, vomiting, diarrhea.  No new back pain or other complaints.  Review of Systems  All other systems reviewed and are negative.    OBJECTIVE:   Blood pressure (!) 121/55, pulse 78, temperature 98.4 F (36.9 C), temperature source Oral, resp. rate 19, height 6' (1.829 m), weight 70.9 kg, SpO2 97 %. Body mass index is 21.2 kg/m.  Physical Exam Constitutional:      Comments: Lying in bed, no acute distress, quiet, comfortable.  HENT:     Head: Normocephalic and atraumatic.  Pulmonary:     Effort: Pulmonary effort is normal. No respiratory distress.  Skin:    General: Skin is warm and dry.     Findings: No rash.  Neurological:     General: No focal deficit present.     Mental Status: He is oriented to person, place, and time.  Psychiatric:        Mood and Affect: Mood normal.        Behavior: Behavior normal.     Lab Results: Lab Results  Component Value Date   WBC 26.2 (H) 03/30/2021   HGB 9.1 (L) 03/30/2021   HCT 26.2 (L) 03/30/2021   MCV 84.5 03/30/2021   PLT 244 03/30/2021    Lab Results  Component Value Date   NA 132 (L) 03/30/2021   K 4.3 03/30/2021   CO2 26 03/30/2021   GLUCOSE 158 (H) 03/30/2021   BUN 13 03/30/2021   CREATININE 0.72 03/30/2021   CALCIUM 7.9 (L) 03/30/2021   GFRNONAA >60 03/30/2021   GFRAA >60 01/29/2018    Lab Results  Component Value Date   ALT 14 03/30/2021   AST 16 03/30/2021   ALKPHOS 70 03/30/2021   BILITOT 0.5 03/30/2021       Component Value Date/Time   CRP 45.7 (H) 03/25/2021 0646       Component Value Date/Time   ESRSEDRATE 88 (H) 03/25/2021 0646     I have reviewed the micro and lab results in Epic.  Imaging: DG C-Arm 1-60 Min-No Report  Result Date: 03/29/2021 Fluoroscopy was utilized by the requesting physician.  No radiographic interpretation.     Imaging independently reviewed in Epic.    Vedia Coffer for Infectious Disease [facility] [phone] pager 03/30/2021, 10:22 AM

## 2021-03-30 NOTE — Progress Notes (Addendum)
      [address]       [place] [address]             [phone]      1 Day Post-Op Procedure(s) (LRB): APPLICATION OF ANGIOVAC (N/A) Subjective: Sleeping when I entered the room. Only complaint is pain in his chest.   Objective: Vital signs in last 24 hours: Temp:  [97.6 F (36.4 C)-100.2 F (37.9 C)] 98.4 F (36.9 C) (07/06 0449) Pulse Rate:  [60-113] 78 (07/06 0449) Cardiac Rhythm: Normal sinus rhythm (07/05 1837) Resp:  [18-25] 19 (07/06 0449) BP: (103-136)/(52-74) 121/55 (07/06 0449) SpO2:  [92 %-98 %] 97 % (07/06 0449) Arterial Line BP: (129-152)/(49-52) 129/49 (07/05 1745)     Intake/Output from previous day: 07/05 0701 - 07/06 0700 In: 4745.2 [P.O.:360; I.V.:2721.5; IV Piggyback:1663.7] Out: 1600 [Urine:1600] Intake/Output this shift: No intake/output data recorded.  General appearance: alert, cooperative, and no distress Heart: regular rate and rhythm, S1, S2 normal, no murmur, click, rub or gallop Lungs: clear to auscultation bilaterally Abdomen: soft, non-tender; bowel sounds normal; no masses,  no organomegaly Extremities: extremities normal, atraumatic, no cyanosis or edema Wound: covered with dressing  Lab Results: Recent Labs    03/29/21 0250 03/30/21 0500  WBC 27.5* 26.2*  HGB 10.7* 9.1*  HCT 30.2* 26.2*  PLT 252 244   BMET:  Recent Labs    03/28/21 0555 03/29/21 0250  NA 131* 128*  K 3.8 3.8  CL 94* 94*  CO2 26 23  GLUCOSE 148* 175*  BUN 11 13  CREATININE 0.72 0.81  CALCIUM 8.6* 8.5*    PT/INR: No results for input(s): LABPROT, INR in the last 72 hours. ABG    Component Value Date/Time   TCO2 29 03/25/2021 0745   CBG (last 3)  No results for input(s): GLUCAP in the last 72 hours.  Assessment/Plan: S/P Procedure(s) (LRB): APPLICATION OF ANGIOVAC (N/A)  NSR in the 70s, BP well controlled On room air with good oxygen saturation, no CXR this morning Blood glucose well controlled H and H stable Pain  control up to medicine. He has a hx of IVDU and he states that 2 percocets isn't working for him.   Plan: Will remove sutures tomorrow. Continue supportive care. Encouraged ambulation.    LOS: 5 days    Sharlene Dory 03/30/2021  Agree with above. Will remove sutures tomorrow.  Tarisha Fader Keane Scrape

## 2021-03-30 NOTE — Anesthesia Postprocedure Evaluation (Addendum)
Anesthesia Post Note  Patient: Duane Price  Procedure(s) Performed: APPLICATION OF ANGIOVAC     Patient location during evaluation: PACU Anesthesia Type: General Level of consciousness: patient cooperative and awake Pain management: pain level controlled Vital Signs Assessment: post-procedure vital signs reviewed and stable Respiratory status: spontaneous breathing, nonlabored ventilation, respiratory function stable and patient connected to nasal cannula oxygen Cardiovascular status: blood pressure returned to baseline and stable Postop Assessment: no apparent nausea or vomiting Anesthetic complications: no   No notable events documented.  Last Vitals:  Vitals:   03/30/21 1549 03/30/21 1947  BP: 116/66 115/60  Pulse: 85 86  Resp: 16 17  Temp: 36.9 C 37.1 C  SpO2: 97% 97%    Last Pain:  Vitals:   03/30/21 1947  TempSrc: Oral  PainSc:                  [name]

## 2021-03-30 NOTE — Progress Notes (Signed)
PHARMACY - PHYSICIAN COMMUNICATION CRITICAL VALUE ALERT - BLOOD CULTURE IDENTIFICATION (BCID)  Duane Price is a 35 y.o. male admitted on 03/25/2021 with MRSA endocarditis w/ TV vegetation, septic emboli. Blood cultures are still positive for MRSA, he will continue on vancomycin.   Name of physician (or Provider) Contacted: [place] MD  Current antibiotics: Vancomycin  Changes to prescribed antibiotics recommended:  Patient is on recommended antibiotics - No changes needed  Results for orders placed or performed during the hospital encounter of 03/25/21  Blood Culture ID Panel (Reflexed) (Collected: 03/29/2021 10:00 AM)  Result Value Ref Range   Enterococcus faecalis NOT DETECTED NOT DETECTED   Enterococcus Faecium NOT DETECTED NOT DETECTED   Listeria monocytogenes NOT DETECTED NOT DETECTED   Staphylococcus species DETECTED (A) NOT DETECTED   Staphylococcus aureus (BCID) DETECTED (A) NOT DETECTED   Staphylococcus epidermidis NOT DETECTED NOT DETECTED   Staphylococcus lugdunensis NOT DETECTED NOT DETECTED   Streptococcus species NOT DETECTED NOT DETECTED   Streptococcus agalactiae NOT DETECTED NOT DETECTED   Streptococcus pneumoniae NOT DETECTED NOT DETECTED   Streptococcus pyogenes NOT DETECTED NOT DETECTED   A.calcoaceticus-baumannii NOT DETECTED NOT DETECTED   Bacteroides fragilis NOT DETECTED NOT DETECTED   Enterobacterales NOT DETECTED NOT DETECTED   Enterobacter cloacae complex NOT DETECTED NOT DETECTED   Escherichia coli NOT DETECTED NOT DETECTED   Klebsiella aerogenes NOT DETECTED NOT DETECTED   Klebsiella oxytoca NOT DETECTED NOT DETECTED   Klebsiella pneumoniae NOT DETECTED NOT DETECTED   Proteus species NOT DETECTED NOT DETECTED   Salmonella species NOT DETECTED NOT DETECTED   Serratia marcescens NOT DETECTED NOT DETECTED   Haemophilus influenzae NOT DETECTED NOT DETECTED   Neisseria meningitidis NOT DETECTED NOT DETECTED   Pseudomonas aeruginosa NOT DETECTED NOT DETECTED    Stenotrophomonas maltophilia NOT DETECTED NOT DETECTED   Candida albicans NOT DETECTED NOT DETECTED   Candida auris NOT DETECTED NOT DETECTED   Candida glabrata NOT DETECTED NOT DETECTED   Candida krusei NOT DETECTED NOT DETECTED   Candida parapsilosis NOT DETECTED NOT DETECTED   Candida tropicalis NOT DETECTED NOT DETECTED   Cryptococcus neoformans/gattii NOT DETECTED NOT DETECTED   Meth resistant mecA/C and MREJ DETECTED (A) NOT DETECTED   Sheppard Coil PharmD., BCPS Clinical Pharmacist 03/30/2021 6:22 PM

## 2021-03-30 NOTE — Progress Notes (Signed)
Progress Note  Patient Name: Duane Price Date of Encounter: 03/30/2021  [facility] Cardiologist: Hilty   Subjective  35 year old gentleman admitted March 25, 2021 with MRSA endocarditis with tricuspid valve vegetation, septic pulmonary emboli. Had angio jet removal of his TV vegetation    Inpatient Medications    Scheduled Meds:  Chlorhexidine Gluconate Cloth  6 each Topical Q0600   enoxaparin (LOVENOX) injection  40 mg Subcutaneous Q24H   mupirocin ointment  1 application Nasal BID   Continuous Infusions:  sodium chloride 75 mL/hr at 03/30/21 0421   vancomycin 1,250 mg (03/30/21 0920)   PRN Meds: acetaminophen **OR** acetaminophen, oxyCODONE-acetaminophen, traZODone   Vital Signs    Vitals:   03/29/21 1911 03/29/21 1926 03/29/21 1941 03/30/21 0449  BP: 108/65 123/61 126/67 (!) 121/55  Pulse: 80 60 68 78  Resp:    19  Temp:    98.4 F (36.9 C)  TempSrc:    Oral  SpO2: 96% 97% 94% 97%  Weight:      Height:        Intake/Output Summary (Last 24 hours) at 03/30/2021 1035 Last data filed at 03/30/2021 1610 Gross per 24 hour  Intake 4985.2 ml  Output 2100 ml  Net 2885.2 ml    Last 3 Weights 03/27/2021 03/25/2021 03/05/2019  Weight (lbs) 156 lb 4.8 oz 160 lb 174 lb 2.6 oz  Weight (kg) 70.897 kg 72.576 kg 79 kg      Telemetry    NSR - Personally Reviewed  ECG     - Personally Reviewed  Physical Exam   GEN: young gentleman,  mild distress,  appears somewhat incomfortable in bed   Neck: No JVD Cardiac: RRR, soft systolic murmur  Respiratory: Clear to auscultation bilaterally. GI: Soft, nontender, non-distended  MS: No edema; No deformity. Neuro:  Nonfocal  Psych: Normal affect   Labs    High Sensitivity Troponin:   Recent Labs  Lab 03/25/21 1109 03/25/21 1400  TROPONINIHS 9 5       Chemistry Recent Labs  Lab 03/26/21 0404 03/27/21 0712 03/28/21 0555 03/29/21 0250 03/30/21 0500  NA 130*   < > 131* 128* 132*  K 3.2*   < > 3.8 3.8 4.3   CL 90*   < > 94* 94* 101  CO2 26   < > 26 23 26   GLUCOSE 169*   < > 148* 175* 158*  BUN 27*   < > 11 13 13   CREATININE 1.00   < > 0.72 0.81 0.72  CALCIUM 8.8*   < > 8.6* 8.5* 7.9*  PROT 8.1  --   --  7.0 6.5  ALBUMIN 2.8*  --   --  1.9* 1.6*  AST 18  --   --  19 16  ALT 12  --   --  16 14  ALKPHOS 159*  --   --  85 70  BILITOT 1.1  --   --  0.6 0.5  GFRNONAA >60   < > >60 >60 >60  ANIONGAP 14   < > 11 11 5    < > = values in this interval not displayed.      Hematology Recent Labs  Lab 03/28/21 0555 03/29/21 0250 03/30/21 0500  WBC 23.3* 27.5* 26.2*  RBC 4.03* 3.63* 3.10*  HGB 11.8* 10.7* 9.1*  HCT 33.4* 30.2* 26.2*  MCV 82.9 83.2 84.5  MCH 29.3 29.5 29.4  MCHC 35.3 35.4 34.7  RDW 12.9 13.1 13.2  PLT 264 252 244  BNPNo results for input(s): BNP, PROBNP in the last 168 hours.   DDimer No results for input(s): DDIMER in the last 168 hours.   Radiology    DG C-Arm 1-60 Min-No Report  Result Date: 03/29/2021 Fluoroscopy was utilized by the requesting physician.  No radiographic interpretation.    Cardiac Studies      Patient Profile     35 y.o. male with hx of IVDA,  admitted with TV endocarditis and septic pulmonary emboli  Assessment & Plan     TV endocarditis:   for angio vac debridement by Dr. Cliffton Asters today  Procedure apparently went well Needs long term Abx. Is not a PICC candidate   No additional recs      For questions or updates, please contact [facility] Please consult [email or web address] for contact info under        Signed, Kristeen Miss, MD  03/30/2021, 10:35 AM

## 2021-03-31 ENCOUNTER — Inpatient Hospital Stay (HOSPITAL_COMMUNITY): Payer: Self-pay

## 2021-03-31 LAB — ACID FAST SMEAR (AFB, MYCOBACTERIA): Acid Fast Smear: NEGATIVE

## 2021-03-31 LAB — CBC
HCT: 27.1 % — ABNORMAL LOW (ref 39.0–52.0)
Hemoglobin: 9.4 g/dL — ABNORMAL LOW (ref 13.0–17.0)
MCH: 28.9 pg (ref 26.0–34.0)
MCHC: 34.7 g/dL (ref 30.0–36.0)
MCV: 83.4 fL (ref 80.0–100.0)
Platelets: 308 10*3/uL (ref 150–400)
RBC: 3.25 MIL/uL — ABNORMAL LOW (ref 4.22–5.81)
RDW: 13.1 % (ref 11.5–15.5)
WBC: 23.8 10*3/uL — ABNORMAL HIGH (ref 4.0–10.5)
nRBC: 0 % (ref 0.0–0.2)

## 2021-03-31 LAB — BASIC METABOLIC PANEL
Anion gap: 8 (ref 5–15)
BUN: 12 mg/dL (ref 6–20)
CO2: 24 mmol/L (ref 22–32)
Calcium: 8.1 mg/dL — ABNORMAL LOW (ref 8.9–10.3)
Chloride: 99 mmol/L (ref 98–111)
Creatinine, Ser: 0.71 mg/dL (ref 0.61–1.24)
GFR, Estimated: >60 mL/min (ref >60)
Glucose, Bld: 129 mg/dL — ABNORMAL HIGH (ref 70–99)
Potassium: 4.1 mmol/L (ref 3.5–5.1)
Sodium: 131 mmol/L — ABNORMAL LOW (ref 135–145)

## 2021-03-31 LAB — TROPONIN I (HIGH SENSITIVITY)
Troponin I (High Sensitivity): 15 ng/L (ref <18)
Troponin I (High Sensitivity): 19 ng/L — ABNORMAL HIGH (ref <18)

## 2021-03-31 IMAGING — DX DG CHEST 1V PORT
1 series · 1 of 1 positions shown · non-contrast
Comparison: [DATE]

CLINICAL DATA: Reason for exam: chest pains Patient reports center
chest pains with slight sob for past few hours. Current smoker.

EXAM:
PORTABLE CHEST - 1 VIEW

[chest]
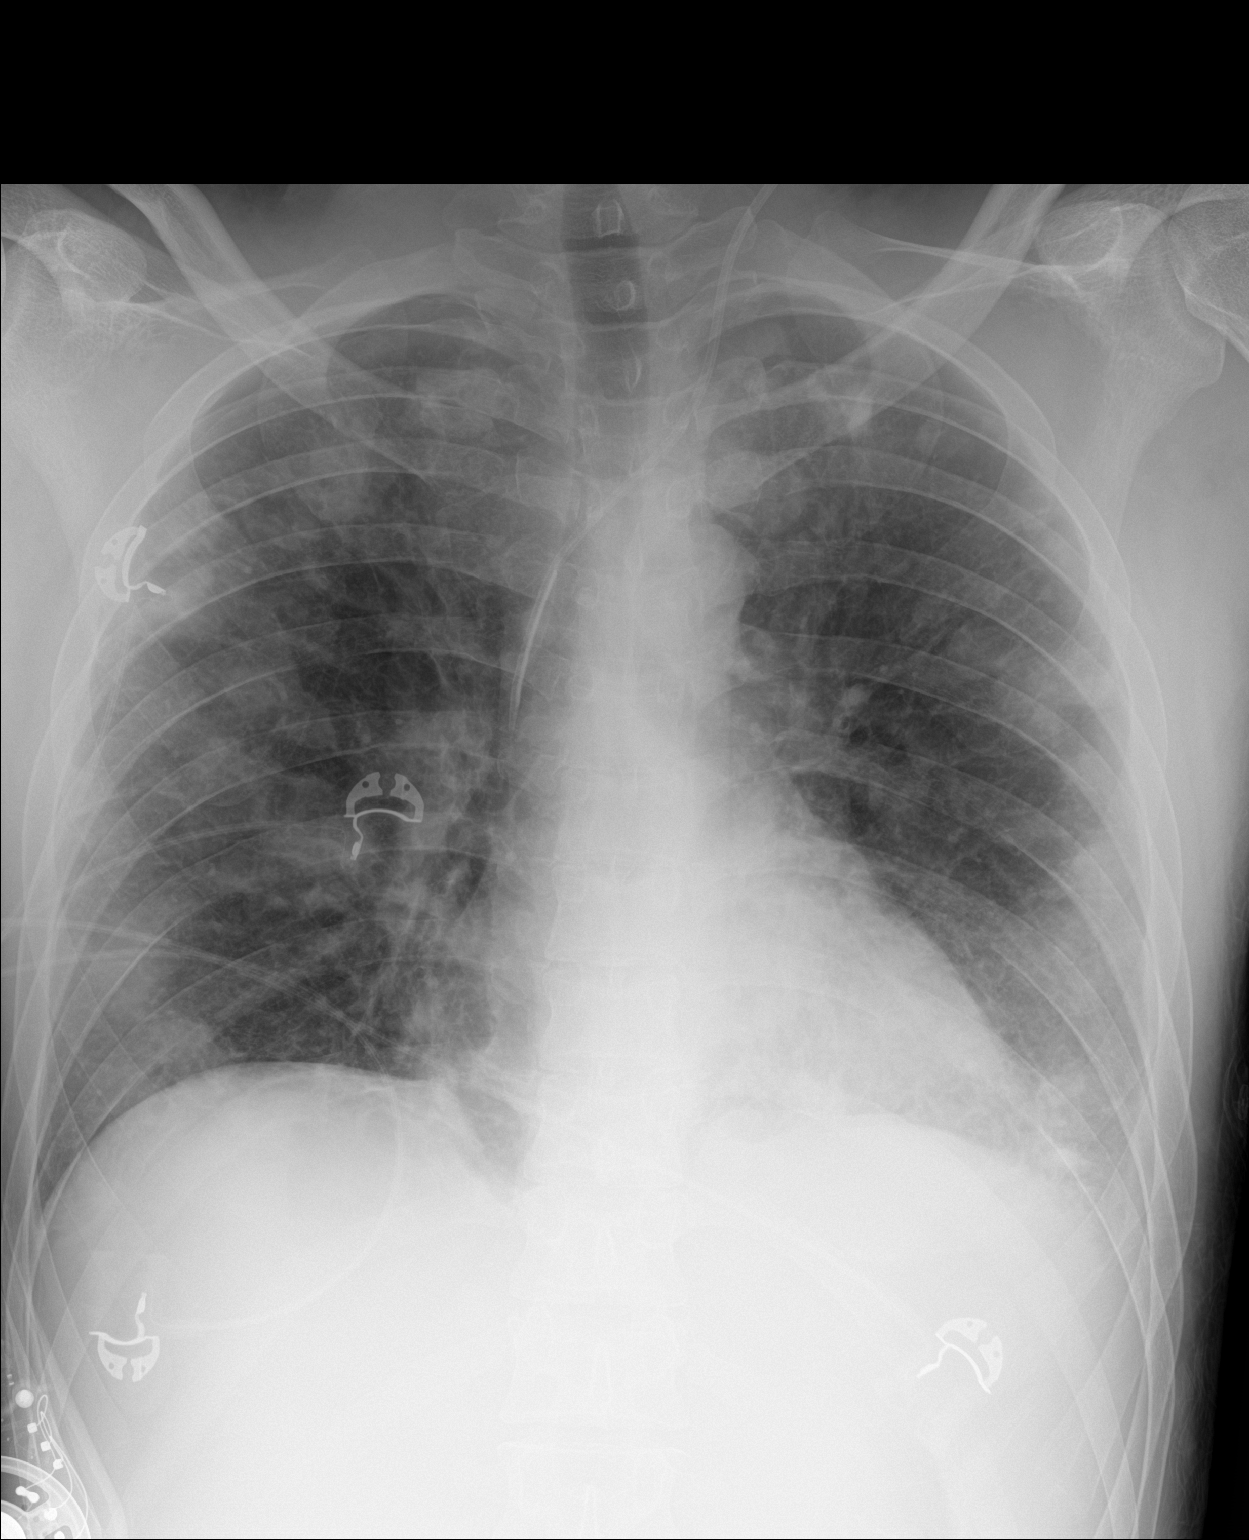

[1 of 1 positions shown; findings below may reference images not displayed]

FINDINGS: Interval increase in size and number of peripheral nodular pulmonary
opacities. Left IJ central venous catheter placement to the mid SVC.
No pneumothorax.

Heart size and mediastinal contours are within normal limits.

No effusion.

Visualized bones unremarkable.
IMPRESSION: 1. Worsening bilateral nodular pulmonary opacities.
2. Central line to the SVC without pneumothorax.

## 2021-03-31 MED ORDER — NITROGLYCERIN 0.4 MG SL SUBL: 0.4000 mg | SUBLINGUAL_TABLET | SUBLINGUAL | Status: DC | PRN

## 2021-03-31 MED ORDER — SODIUM CHLORIDE 0.9% FLUSH
10.0000 mL | Freq: Two times a day (BID) | INTRAVENOUS | Status: DC
  Administered 2021-04-01 – 2021-04-08 (×13): 10 mL

## 2021-03-31 MED ORDER — PANTOPRAZOLE SODIUM 40 MG PO TBEC
40.0000 mg | DELAYED_RELEASE_TABLET | Freq: Every day | ORAL | Status: DC
  Administered 2021-03-31 – 2021-04-25 (×26): 40 mg via ORAL
  Filled 2021-03-31: qty 2
  Filled 2021-03-31 (×6): qty 1
  Filled 2021-03-31: qty 2
  Filled 2021-03-31 (×18): qty 1

## 2021-03-31 MED ORDER — MORPHINE SULFATE (PF) 2 MG/ML IV SOLN
2.0000 mg | Freq: Once | INTRAVENOUS | Status: AC
  Administered 2021-03-31: 2 mg via INTRAVENOUS
  Filled 2021-03-31: qty 1

## 2021-03-31 MED ORDER — SODIUM CHLORIDE 0.9% FLUSH
10.0000 mL | INTRAVENOUS | Status: DC | PRN
  Administered 2021-03-31: 30 mL

## 2021-03-31 NOTE — Progress Notes (Signed)
Pharmacy Antibiotic Note  Duane Price is a 35 y.o. male admitted on 03/25/2021 with MRSA endocarditis w/ TV vegetation, septic emboli.  Pharmacy has been consulted for vancomycin.  Recent levels on 7/3 were therapeutic. Bcx with 1/4 bottles still growing MRSA. Scr remains stable at 0.7. WBC 23.8. Afebrile   Plan: Continue Vancomycin 1250mg  IV Q12h F/u repeat Bcx, ID recs, clinical pic    Height: 6' (182.9 cm) Weight: 70.9 kg (156 lb 4.8 oz) IBW/kg (Calculated) : 77.6  Temp (24hrs), Avg:98.6 F (37 C), Min:98.4 F (36.9 C), Max:98.8 F (37.1 C)  Recent Labs  Lab 03/25/21 0630 03/25/21 0641 03/25/21 0745 03/27/21 0712 03/27/21 2006 03/28/21 0555 03/28/21 1158 03/28/21 2000 03/29/21 0250 03/30/21 0500 03/31/21 0810  WBC  --   --    < >  --  23.4* 23.3*  --   --  27.5* 26.2* 23.8*  CREATININE  --   --    < > 0.71  --  0.72  --   --  0.81 0.72 0.71  LATICACIDVEN 2.4* 2.1*  --   --   --   --   --   --   --   --   --   VANCOTROUGH  --   --   --   --   --   --   --  10*  --   --   --   VANCORANDOM  --   --   --   --   --   --  32  --   --   --   --    < > = values in this interval not displayed.     Estimated Creatinine Clearance: 132.9 mL/min (by C-G formula based on SCr of 0.71 mg/dL).    No Known Allergies  Antimicrobials this admission: Cefepime 7/1 >>7/2 Vancomycin 7/1 >>   Microbiology results: 7/7 Bcx: sent 7/5 Tissue veg cx: pending 7/5 Bcx: 1/4 MRSA 7/1 BCx: 3/3 staph aureus, MecA+, MREJ+ 7/1 UA mod Hgb, + nitrite, specific gravity >1 7/1 HCV ab reactive, HIV neg, hep A/B neg  9/1, PharmD, BCCCP Clinical Pharmacist  Phone: [phone] 03/31/2021 1:54 PM  Please check AMION for all [facility] phone numbers After 10:00 PM, call Main Pharmacy [phone]
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
## 2021-03-31 NOTE — Progress Notes (Signed)
[facility]  Date of Admission:  03/25/2021     Total days of antibiotics 7         ASSESSMENT:  [name] has one bottle from his blood cultures that is positive for MRSA. Surgical specimen culture is pending. Will need to have central line removed/holiday as it was placed when he was bacteremic. Recheck blood cultures today and consider additional set of cultures once central line is removed. Renal function remains stable with no nephrotoxicity from vancomycin. Will need 6 weeks starting from clearance of cultures. Pain currently adequately controlled.   PLAN:  Continue current dose of vancomycin. Remove central line when able for line holiday  Repeat blood cultures today. Remaining care per primary team.    Principal Problem:   MRSA bacteremia Active Problems:   Septic embolism (HCC)   Normocytic anemia   IV drug abuse (HCC)   Endocarditis of tricuspid valve   HCV antibody positive    Chlorhexidine Gluconate Cloth  6 each Topical Q0600   enoxaparin (LOVENOX) injection  40 mg Subcutaneous Q24H   mupirocin ointment  1 application Nasal BID   pantoprazole  40 mg Oral Daily   sodium chloride flush  10-40 mL Intracatheter Q12H    SUBJECTIVE:  Afebrile overnight with no acute events with WBC count stable. Pain adequately controlled with no new concerns/complaints.   No Known Allergies   Review of Systems: Review of Systems  Constitutional:  Negative for chills, fever and weight loss.  Respiratory:  Negative for cough, shortness of breath and wheezing.   Cardiovascular:  Negative for chest pain and leg swelling.  Gastrointestinal:  Negative for abdominal pain, constipation, diarrhea, nausea and vomiting.  Skin:  Negative for rash.     OBJECTIVE: Vitals:   03/30/21 0449 03/30/21 1549 03/30/21 1947 03/31/21 0630  BP: (!) 121/55 116/66 115/60 123/76  Pulse: 78 85 86 82  Resp: 19 16 17 16   Temp: 98.4 F (36.9 C) 98.4 F (36.9 C) 98.8  F (37.1 C) 98.6 F (37 C)  TempSrc: Oral Oral Oral Oral  SpO2: 97% 97% 97% 96%  Weight:      Height:       Body mass index is 21.2 kg/m.  Physical Exam Constitutional:      General: He is not in acute distress.    Appearance: He is well-developed.     Comments: Lying in bed with head of bed elevated; sleeping on entry and easily arousible.   Cardiovascular:     Rate and Rhythm: Normal rate and regular rhythm.     Heart sounds: Normal heart sounds.     Comments: Central line in left IJ with clean and dry dressing.  Pulmonary:     Effort: Pulmonary effort is normal.     Breath sounds: Normal breath sounds.  Skin:    General: Skin is warm and dry.  Neurological:     Mental Status: He is alert and oriented to person, place, and time.    Lab Results Lab Results  Component Value Date   WBC 23.8 (H) 03/31/2021   HGB 9.4 (L) 03/31/2021   HCT 27.1 (L) 03/31/2021   MCV 83.4 03/31/2021   PLT 308 03/31/2021    Lab Results  Component Value Date   CREATININE 0.71 03/31/2021   BUN 12 03/31/2021   NA 131 (L) 03/31/2021   K 4.1 03/31/2021   CL 99 03/31/2021   CO2 24 03/31/2021    Lab Results  Component Value Date   ALT 14 03/30/2021   AST 16 03/30/2021   ALKPHOS 70 03/30/2021   BILITOT 0.5 03/30/2021     Microbiology: Recent Results (from the past 240 hour(s))  Blood culture (routine x 2)     Status: Abnormal   Collection Time: 03/25/21  5:07 AM   Specimen: BLOOD  Result Value Ref Range Status   Specimen Description   Final    BLOOD RIGHT ANTECUBITAL Performed at [facility], [address] [place], [place] [address]    Special Requests   Final    BOTTLES DRAWN AEROBIC ONLY Blood Culture adequate volume Performed at [facility], [address], [place], [place] [address]    Culture  Setup Time   Final    GRAM POSITIVE COCCI IN CLUSTERS AEROBIC BOTTLE ONLY CRITICAL RESULT CALLED TO, READ BACK BY AND VERIFIED WITH: Erling Cruz [facility] 03/25/21 2301 JDW Performed at [facility], [address], [place], [place] [address]    Culture METHICILLIN RESISTANT STAPHYLOCOCCUS AUREUS (A)  Final   Report Status 03/27/2021 FINAL  Final   Organism ID, Bacteria METHICILLIN RESISTANT STAPHYLOCOCCUS AUREUS  Final      Susceptibility   Methicillin resistant staphylococcus aureus - MIC*    CIPROFLOXACIN >=8 RESISTANT Resistant     ERYTHROMYCIN >=8 RESISTANT Resistant     GENTAMICIN <=0.5 SENSITIVE Sensitive     OXACILLIN >=4 RESISTANT Resistant     TETRACYCLINE <=1 SENSITIVE Sensitive     VANCOMYCIN 1 SENSITIVE Sensitive     TRIMETH/SULFA <=10 SENSITIVE Sensitive     CLINDAMYCIN <=0.25 SENSITIVE Sensitive     RIFAMPIN <=0.5 SENSITIVE Sensitive     Inducible Clindamycin NEGATIVE Sensitive     * METHICILLIN RESISTANT STAPHYLOCOCCUS AUREUS  Blood Culture ID Panel (Reflexed)     Status: Abnormal   Collection Time: 03/25/21  5:07 AM  Result Value Ref Range Status   Enterococcus faecalis NOT DETECTED NOT DETECTED Final   Enterococcus Faecium NOT DETECTED NOT DETECTED Final   Listeria monocytogenes NOT DETECTED NOT DETECTED Final   Staphylococcus species DETECTED (A) NOT DETECTED Final    Comment: CRITICAL RESULT CALLED TO, READ BACK BY AND VERIFIED WITH: E JACKSON PHARMD 03/25/21 2301 JDW    Staphylococcus aureus (BCID) DETECTED (A) NOT DETECTED Final    Comment: Methicillin (oxacillin)-resistant Staphylococcus aureus (MRSA). MRSA is predictably resistant to beta-lactam antibiotics (except ceftaroline). Preferred therapy is vancomycin unless clinically contraindicated. Patient requires contact precautions if  hospitalized. CRITICAL RESULT CALLED TO, READ BACK BY AND VERIFIED WITH: E JACKSON PHARMD 03/25/21 2301 JDW    Staphylococcus epidermidis NOT DETECTED NOT DETECTED Final   Staphylococcus lugdunensis NOT DETECTED NOT DETECTED Final   Streptococcus species NOT DETECTED NOT DETECTED Final   Streptococcus agalactiae  NOT DETECTED NOT DETECTED Final   Streptococcus pneumoniae NOT DETECTED NOT DETECTED Final   Streptococcus pyogenes NOT DETECTED NOT DETECTED Final   A.calcoaceticus-baumannii NOT DETECTED NOT DETECTED Final   Bacteroides fragilis NOT DETECTED NOT DETECTED Final   Enterobacterales NOT DETECTED NOT DETECTED Final   Enterobacter cloacae complex NOT DETECTED NOT DETECTED Final   Escherichia coli NOT DETECTED NOT DETECTED Final   Klebsiella aerogenes NOT DETECTED NOT DETECTED Final   Klebsiella oxytoca NOT DETECTED NOT DETECTED Final   Klebsiella pneumoniae NOT DETECTED NOT DETECTED Final   Proteus species NOT DETECTED NOT DETECTED Final   Salmonella species NOT DETECTED NOT DETECTED Final   Serratia marcescens NOT DETECTED NOT DETECTED  Final   Haemophilus influenzae NOT DETECTED NOT DETECTED Final   Neisseria meningitidis NOT DETECTED NOT DETECTED Final   Pseudomonas aeruginosa NOT DETECTED NOT DETECTED Final   Stenotrophomonas maltophilia NOT DETECTED NOT DETECTED Final   Candida albicans NOT DETECTED NOT DETECTED Final   Candida auris NOT DETECTED NOT DETECTED Final   Candida glabrata NOT DETECTED NOT DETECTED Final   Candida krusei NOT DETECTED NOT DETECTED Final   Candida parapsilosis NOT DETECTED NOT DETECTED Final   Candida tropicalis NOT DETECTED NOT DETECTED Final   Cryptococcus neoformans/gattii NOT DETECTED NOT DETECTED Final   Meth resistant mecA/C and MREJ DETECTED (A) NOT DETECTED Final    Comment: CRITICAL RESULT CALLED TO, READ BACK BY AND VERIFIED WITHErling Cruz [facility] 03/25/21 2301 JDW Performed at [facility], [address] [place], [place] [address]   SARS CORONAVIRUS 2 (TAT 6-24 HRS) Nasopharyngeal Nasopharyngeal Swab     Status: None   Collection Time: 03/25/21  5:15 AM   Specimen: Nasopharyngeal Swab  Result Value Ref Range Status   SARS Coronavirus 2 NEGATIVE NEGATIVE Final    Comment: (NOTE) SARS-CoV-2 target nucleic acids are NOT DETECTED.  The  SARS-CoV-2 RNA is generally detectable in upper and lower respiratory specimens during the acute phase of infection. Negative results do not preclude SARS-CoV-2 infection, do not rule out co-infections with other pathogens, and should not be used as the sole basis for treatment or other patient management decisions. Negative results must be combined with clinical observations, patient history, and epidemiological information. The expected result is Negative.  Fact Sheet for Patients: HairSlick.no  Fact Sheet for Healthcare Providers: quierodirigir.com  This test is not yet approved or cleared by the [place] FDA and  has been authorized for detection and/or diagnosis of SARS-CoV-2 by FDA under an Emergency Use Authorization (EUA). This EUA will remain  in effect (meaning this test can be used) for the duration of the COVID-19 declaration under Se ction 564(b)(1) of the Act, 21 U.S.C. section 360bbb-3(b)(1), unless the authorization is terminated or revoked sooner.  Performed at [facility], [address] [place], [place] [address]   Blood culture (routine x 2)     Status: Abnormal   Collection Time: 03/25/21  5:49 AM   Specimen: BLOOD  Result Value Ref Range Status   Specimen Description   Final    BLOOD LEFT ARM Performed at [facility], [address] [place], [place] [address]    Special Requests   Final    BOTTLES DRAWN AEROBIC AND ANAEROBIC Blood Culture results may not be optimal due to an inadequate volume of blood received in culture bottles Performed at [facility], [address], [place], [place] [address]    Culture  Setup Time   Final    GRAM POSITIVE COCCI IN CLUSTERS IN BOTH AEROBIC AND ANAEROBIC BOTTLES IDENTIFICATION TO FOLLOW CRITICAL VALUE NOTED.  VALUE IS CONSISTENT WITH PREVIOUSLY REPORTED AND CALLED VALUE.    Culture (A)  Final     STAPHYLOCOCCUS AUREUS SUSCEPTIBILITIES PERFORMED ON PREVIOUS CULTURE WITHIN THE LAST 5 DAYS. Performed at [facility], [address], [place], [place] [address]    Report Status 03/27/2021 FINAL  Final  Surgical pcr screen     Status: Abnormal   Collection Time: 03/29/21  5:20 AM   Specimen: Nasal Mucosa; Nasal Swab  Result Value Ref Range Status   MRSA, PCR POSITIVE (A) NEGATIVE Final  Comment: RESULT CALLED TO, READ BACK BY AND VERIFIED WITH: DJoesphine Bare RN, AT 3888 03/29/21 D. VANHOOK    Staphylococcus aureus POSITIVE (A) NEGATIVE Final    Comment: (NOTE) The Xpert SA Assay (FDA approved for NASAL specimens in patients 93 years of age and older), is one component of a comprehensive surveillance program. It is not intended to diagnose infection nor to guide or monitor treatment. Performed at [facility], [address], [place], [place] [address]   Culture, blood (routine x 2)     Status: Abnormal (Preliminary result)   Collection Time: 03/29/21 10:00 AM   Specimen: BLOOD LEFT HAND  Result Value Ref Range Status   Specimen Description BLOOD LEFT HAND  Final   Special Requests   Final    BOTTLES DRAWN AEROBIC AND ANAEROBIC Blood Culture results may not be optimal due to an inadequate volume of blood received in culture bottles   Culture  Setup Time   Final    GRAM POSITIVE COCCI IN CLUSTERS ANAEROBIC BOTTLE ONLY Organism ID to follow CRITICAL RESULT CALLED TO, READ BACK BY AND VERIFIED WITHKelby Fam PHARMD 1811 03/30/21 A BROWNING    Culture (A)  Final    STAPHYLOCOCCUS AUREUS SUSCEPTIBILITIES TO FOLLOW Performed at [facility], [address], [place], [place] [address]    Report Status PENDING  Incomplete  Blood Culture ID Panel (Reflexed)     Status: Abnormal   Collection Time: 03/29/21 10:00 AM  Result Value Ref Range Status   Enterococcus faecalis NOT DETECTED NOT DETECTED Final   Enterococcus Faecium NOT DETECTED NOT DETECTED Final   Listeria  monocytogenes NOT DETECTED NOT DETECTED Final   Staphylococcus species DETECTED (A) NOT DETECTED Final    Comment: CRITICAL RESULT CALLED TO, READ BACK BY AND VERIFIED WITHKelby Fam PHARMD 1811 03/30/21 A BROWNING    Staphylococcus aureus (BCID) DETECTED (A) NOT DETECTED Final    Comment: Methicillin (oxacillin)-resistant Staphylococcus aureus (MRSA). MRSA is predictably resistant to beta-lactam antibiotics (except ceftaroline). Preferred therapy is vancomycin unless clinically contraindicated. Patient requires contact precautions if  hospitalized. CRITICAL RESULT CALLED TO, READ BACK BY AND VERIFIED WITH: Kelby Fam PHARMD 1505 03/30/21 A BROWNING    Staphylococcus epidermidis NOT DETECTED NOT DETECTED Final   Staphylococcus lugdunensis NOT DETECTED NOT DETECTED Final   Streptococcus species NOT DETECTED NOT DETECTED Final   Streptococcus agalactiae NOT DETECTED NOT DETECTED Final   Streptococcus pneumoniae NOT DETECTED NOT DETECTED Final   Streptococcus pyogenes NOT DETECTED NOT DETECTED Final   A.calcoaceticus-baumannii NOT DETECTED NOT DETECTED Final   Bacteroides fragilis NOT DETECTED NOT DETECTED Final   Enterobacterales NOT DETECTED NOT DETECTED Final   Enterobacter cloacae complex NOT DETECTED NOT DETECTED Final   Escherichia coli NOT DETECTED NOT DETECTED Final   Klebsiella aerogenes NOT DETECTED NOT DETECTED Final   Klebsiella oxytoca NOT DETECTED NOT DETECTED Final   Klebsiella pneumoniae NOT DETECTED NOT DETECTED Final   Proteus species NOT DETECTED NOT DETECTED Final   Salmonella species NOT DETECTED NOT DETECTED Final   Serratia marcescens NOT DETECTED NOT DETECTED Final   Haemophilus influenzae NOT DETECTED NOT DETECTED Final   Neisseria meningitidis NOT DETECTED NOT DETECTED Final   Pseudomonas aeruginosa NOT DETECTED NOT DETECTED Final   Stenotrophomonas maltophilia NOT DETECTED NOT DETECTED Final   Candida albicans NOT DETECTED NOT DETECTED Final   Candida auris NOT  DETECTED NOT DETECTED Final   Candida glabrata NOT DETECTED NOT DETECTED Final   Candida krusei  NOT DETECTED NOT DETECTED Final   Candida parapsilosis NOT DETECTED NOT DETECTED Final   Candida tropicalis NOT DETECTED NOT DETECTED Final   Cryptococcus neoformans/gattii NOT DETECTED NOT DETECTED Final   Meth resistant mecA/C and MREJ DETECTED (A) NOT DETECTED Final    Comment: CRITICAL RESULT CALLED TO, READ BACK BY AND VERIFIED WITHKelby Fam [facility] 03/30/21 A BROWNING Performed at [facility], [address] [place], [place] [address]   Culture, blood (routine x 2)     Status: None (Preliminary result)   Collection Time: 03/29/21 11:30 AM   Specimen: BLOOD LEFT HAND  Result Value Ref Range Status   Specimen Description BLOOD LEFT HAND  Final   Special Requests   Final    BOTTLES DRAWN AEROBIC AND ANAEROBIC Blood Culture results may not be optimal due to an inadequate volume of blood received in culture bottles   Culture   Final    NO GROWTH 2 DAYS Performed at [facility], [address], [place], [place] [address]    Report Status PENDING  Incomplete  Aerobic/Anaerobic Culture w Gram Stain (surgical/deep wound)     Status: None (Preliminary result)   Collection Time: 03/29/21  4:32 PM   Specimen: Heart Valve; Tissue  Result Value Ref Range Status   Specimen Description TISSUE  Final   Special Requests TRICUSPID VEGETATION  Final   Gram Stain PENDING  Incomplete   Culture   Final    TOO YOUNG TO READ Performed at [facility], [address] [place], [place] [address]    Report Status PENDING  Incomplete  Acid Fast Smear (AFB)     Status: None   Collection Time: 03/29/21  4:32 PM   Specimen: Heart Valve; Tissue  Result Value Ref Range Status   AFB Specimen Processing Concentration  Final   Acid Fast Smear Negative  Final    Comment: (NOTE) Performed At: [facility] [address] [place], [place] [id number] Jolene Schimke MD  JF:[phone]    Source (AFB) TISSUE  Final    Comment: TRICUSPID VEGETATION Performed at [facility], [address], [place], [place] [address]      Marcos Eke, NP [facility]  03/31/2021  10:39 AM

## 2021-03-31 NOTE — Progress Notes (Signed)
Progress Note  Patient Name: Duane Price Date of Encounter: 03/31/2021  [facility] Cardiologist: Hilty   Subjective  35 year old gentleman admitted March 25, 2021 with MRSA endocarditis with tricuspid valve vegetation, septic pulmonary emboli. Had angio vac removal of his TV vegetation Seems to be stable    Inpatient Medications    Scheduled Meds:  Chlorhexidine Gluconate Cloth  6 each Topical Q0600   enoxaparin (LOVENOX) injection  40 mg Subcutaneous Q24H   mupirocin ointment  1 application Nasal BID   pantoprazole  40 mg Oral Daily   sodium chloride flush  10-40 mL Intracatheter Q12H   Continuous Infusions:  vancomycin 1,250 mg (03/31/21 0906)   PRN Meds: acetaminophen **OR** acetaminophen, HYDROmorphone, LORazepam, nitroGLYCERIN, sodium chloride flush, traZODone   Vital Signs    Vitals:   03/30/21 0449 03/30/21 1549 03/30/21 1947 03/31/21 0630  BP: (!) 121/55 116/66 115/60 123/76  Pulse: 78 85 86 82  Resp: 19 16 17 16   Temp: 98.4 F (36.9 C) 98.4 F (36.9 C) 98.8 F (37.1 C) 98.6 F (37 C)  TempSrc: Oral Oral Oral Oral  SpO2: 97% 97% 97% 96%  Weight:      Height:        Intake/Output Summary (Last 24 hours) at 03/31/2021 0946 Last data filed at 03/31/2021 0631 Gross per 24 hour  Intake 240 ml  Output 875 ml  Net -635 ml    Last 3 Weights 03/27/2021 03/25/2021 03/05/2019  Weight (lbs) 156 lb 4.8 oz 160 lb 174 lb 2.6 oz  Weight (kg) 70.897 kg 72.576 kg 79 kg      Telemetry    NSR - Personally Reviewed  ECG     - Personally Reviewed  Physical Exam    Physical Exam: Blood pressure 123/76, pulse 82, temperature 98.6 F (37 C), temperature source Oral, resp. rate 16, height 6' (1.829 m), weight 70.9 kg, SpO2 96 %.  GEN:  Well nourished, young male.  HEENT: Normal NECK: No JVD; No carotid bruits LYMPHATICS: No lymphadenopathy CARDIAC: RRR   RESPIRATORY:  Clear to auscultation without rales, wheezing or rhonchi  ABDOMEN: Soft, non-tender,  non-distended MUSCULOSKELETAL:  No edema; No deformity  SKIN: Warm and dry NEUROLOGIC:  Alert and oriented x 3   Labs    High Sensitivity Troponin:   Recent Labs  Lab 03/25/21 1109 03/25/21 1400 03/31/21 0810  TROPONINIHS 9 5 15        Chemistry Recent Labs  Lab 03/26/21 0404 03/27/21 0712 03/29/21 0250 03/30/21 0500 03/31/21 0810  NA 130*   < > 128* 132* 131*  K 3.2*   < > 3.8 4.3 4.1  CL 90*   < > 94* 101 99  CO2 26   < > 23 26 24   GLUCOSE 169*   < > 175* 158* 129*  BUN 27*   < > 13 13 12   CREATININE 1.00   < > 0.81 0.72 0.71  CALCIUM 8.8*   < > 8.5* 7.9* 8.1*  PROT 8.1  --  7.0 6.5  --   ALBUMIN 2.8*  --  1.9* 1.6*  --   AST 18  --  19 16  --   ALT 12  --  16 14  --   ALKPHOS 159*  --  85 70  --   BILITOT 1.1  --  0.6 0.5  --   GFRNONAA >60   < > >60 >60 >60  ANIONGAP 14   < > 11 5 8    < > =  values in this interval not displayed.      Hematology Recent Labs  Lab 03/29/21 0250 03/30/21 0500 03/31/21 0810  WBC 27.5* 26.2* 23.8*  RBC 3.63* 3.10* 3.25*  HGB 10.7* 9.1* 9.4*  HCT 30.2* 26.2* 27.1*  MCV 83.2 84.5 83.4  MCH 29.5 29.4 28.9  MCHC 35.4 34.7 34.7  RDW 13.1 13.2 13.1  PLT 252 244 308     BNPNo results for input(s): BNP, PROBNP in the last 168 hours.   DDimer No results for input(s): DDIMER in the last 168 hours.   Radiology    DG C-Arm 1-60 Min-No Report  Result Date: 03/29/2021 Fluoroscopy was utilized by the requesting physician.  No radiographic interpretation.    Cardiac Studies      Patient Profile     35 y.o. male with hx of IVDA,  admitted with TV endocarditis and septic pulmonary emboli  Assessment & Plan     TV endocarditis:   for angio vac debridement by Dr. Cliffton Asters     Doing well  He will need follow up with Dr. Rennis Golden in several months     [facility] will sign off.   Medication Recommendations:  cont plan per primary team and ID  Other recommendations (labs, testing, etc):   Follow up as an outpatient:   with Dr. Rennis Golden in several months    For questions or updates, please contact [facility] Please consult [email or web address] for contact info under        Signed, Kristeen Miss, MD  03/31/2021, 9:46 AM

## 2021-03-31 NOTE — Progress Notes (Addendum)
      [address]       [place] [address]             [phone]      2 Days Post-Op Procedure(s) (LRB): APPLICATION OF ANGIOVAC (N/A) Subjective:  Resting quietly but awakens easily and responds appropriately. Says he feels OK, no new concerns.   Objective: Vital signs in last 24 hours: Temp:  [98.4 F (36.9 C)-98.8 F (37.1 C)] 98.6 F (37 C) (07/07 0630) Pulse Rate:  [82-86] 82 (07/07 0630) Cardiac Rhythm: Normal sinus rhythm (07/07 0752) Resp:  [16-17] 16 (07/07 0630) BP: (115-123)/(60-76) 123/76 (07/07 0630) SpO2:  [96 %-97 %] 96 % (07/07 0630)    EXAM: General appearance: alert, cooperative, and no distress Heart: regular rate and rhythm Lungs: respirations unlabored and clear Abdomen: soft, non-tender Wounds: the right IJ and femoral vein cannulation sites are soft and hemostatic.   Lab Results: Recent Labs    03/30/21 0500 03/31/21 0810  WBC 26.2* 23.8*  HGB 9.1* 9.4*  HCT 26.2* 27.1*  PLT 244 308    BMET:  Recent Labs    03/30/21 0500 03/31/21 0810  NA 132* 131*  K 4.3 4.1  CL 101 99  CO2 26 24  GLUCOSE 158* 129*  BUN 13 12  CREATININE 0.72 0.71  CALCIUM 7.9* 8.1*     PT/INR: No results for input(s): LABPROT, INR in the last 72 hours. ABG    Component Value Date/Time   TCO2 29 03/25/2021 0745   CBG (last 3)  No results for input(s): GLUCAP in the last 72 hours.  Assessment/Plan: S/P Procedure(s) (LRB): APPLICATION OF ANGIOVAC (N/A)  -POD-2 angiovac procedure for debridement of tricuspid valve vegetations due to MRSA endocarditis. Cultures from OR are pending. WBC is trending down, ABX per primary team. The right IJ and right femoral cannulation site sutures were removed. NSR in the 70's-80's, BP remains well controlled  -On room air with good oxygen saturation,  CXR from this AM not read yet but is showing similar pattern of diffuse nodularity c/w septic emboli and some increase in LLL volume loss.   -CT surgery  will be available as needed.      LOS: 6 days    Leary Roca, [place] [phone] 03/31/2021  Agree with above Stitches removed Continue abx  Corliss Skains

## 2021-04-01 LAB — CREATININE, SERUM
Creatinine, Ser: 0.66 mg/dL (ref 0.61–1.24)
GFR, Estimated: >60 mL/min (ref >60)

## 2021-04-01 LAB — CULTURE, BLOOD (ROUTINE X 2)

## 2021-04-01 LAB — HCV RNA QUANT: HCV Quantitative: NOT DETECTED IU/mL (ref >50)

## 2021-04-01 MED ORDER — MORPHINE SULFATE (PF) 2 MG/ML IV SOLN: 1.0000 mg | Freq: Once | INTRAVENOUS | Status: DC

## 2021-04-01 NOTE — Progress Notes (Signed)
Lab called me and relayed that they were not able to do a gram stain on a tissue culture that was collected on him but a growth of staph aureus has come back. I relayed this to D.r [place].

## 2021-04-01 NOTE — Discharge Instructions (Addendum)
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
## 2021-04-01 NOTE — Progress Notes (Signed)
[facility]  Date of Admission:  03/25/2021           Reason for visit: Follow up on MRSA bacteremia and endocarditis  Current antibiotics: Vancomycin  ASSESSMENT:    MRSA bacteremia: Complicated by septic pulmonary emboli and tricuspid valve endocarditis status post angio vac 03/29/2021.  Continues on vancomycin per pharmacy dosing. Hepatitis C antibody positive: HCV RNA viral load pending. Injection drug use: Have discussed with patient that he will not be a PICC line candidate as an outpatient.  Currently he is agreeable to staying for duration of IV antibiotics. Medication monitoring: Creatinine is stable on vancomycin.  PLAN:    Continue vancomycin per pharmacy dosing Routine lab monitoring Follow-up repeat blood cultures drawn yesterday to ensure clearance of bacteremia Follow-up HCV RNA to determine if he needs outpatient treatment Will follow.  Dr. Drue Second available as needed over the weekend.   Principal Problem:   MRSA bacteremia Active Problems:   Septic embolism (HCC)   Normocytic anemia   IV drug abuse (HCC)   Endocarditis of tricuspid valve   HCV antibody positive    MEDICATIONS:    Scheduled Meds:  Chlorhexidine Gluconate Cloth  6 each Topical Q0600   enoxaparin (LOVENOX) injection  40 mg Subcutaneous Q24H   mupirocin ointment  1 application Nasal BID   pantoprazole  40 mg Oral Daily   sodium chloride flush  10-40 mL Intracatheter Q12H   Continuous Infusions:  vancomycin 1,250 mg (04/01/21 1038)   PRN Meds:.acetaminophen **OR** acetaminophen, HYDROmorphone, LORazepam, nitroGLYCERIN, sodium chloride flush, traZODone  SUBJECTIVE:   24 hour events:  No acute events noted overnight Left IJ central line removed Afebrile Repeat blood cultures drawn yesterday no growth to date Tricuspid valve vegetation cultures with abundant MRSA  No new complaints.  No fevers or chills.  No nausea or vomiting.  Respiratory status  stable  Review of Systems  All other systems reviewed and are negative.    OBJECTIVE:   Blood pressure 112/66, pulse 78, temperature 98.6 F (37 C), temperature source Oral, resp. rate 16, height 6' (1.829 m), weight 70.9 kg, SpO2 97 %. Body mass index is 21.2 kg/m.  Physical Exam Constitutional:      Comments: Lying in bed, lethargic, no acute distress  HENT:     Head: Normocephalic and atraumatic.  Eyes:     Extraocular Movements: Extraocular movements intact.     Conjunctiva/sclera: Conjunctivae normal.  Abdominal:     General: There is no distension.     Palpations: Abdomen is soft.     Tenderness: There is no abdominal tenderness.  Musculoskeletal:     Right lower leg: No edema.     Left lower leg: No edema.  Skin:    General: Skin is warm and dry.     Findings: No rash.  Neurological:     General: No focal deficit present.     Mental Status: He is oriented to person, place, and time.     Lab Results: Lab Results  Component Value Date   WBC 23.8 (H) 03/31/2021   HGB 9.4 (L) 03/31/2021   HCT 27.1 (L) 03/31/2021   MCV 83.4 03/31/2021   PLT 308 03/31/2021    Lab Results  Component Value Date   NA 131 (L) 03/31/2021   K 4.1 03/31/2021   CO2 24 03/31/2021   GLUCOSE 129 (H) 03/31/2021   BUN 12 03/31/2021   CREATININE 0.66 04/01/2021   CALCIUM 8.1 (L)  03/31/2021   GFRNONAA >60 04/01/2021   GFRAA >60 01/29/2018    Lab Results  Component Value Date   ALT 14 03/30/2021   AST 16 03/30/2021   ALKPHOS 70 03/30/2021   BILITOT 0.5 03/30/2021       Component Value Date/Time   CRP 45.7 (H) 03/25/2021 0646       Component Value Date/Time   ESRSEDRATE 88 (H) 03/25/2021 0646     I have reviewed the micro and lab results in Epic.  Imaging: DG CHEST PORT 1 VIEW  Result Date: 03/31/2021 CLINICAL DATA:  Reason for exam: chest pains Patient reports center chest pains with slight sob for past few hours. Current smoker. EXAM: PORTABLE CHEST - 1 VIEW  COMPARISON:  03/25/2021 FINDINGS: Interval increase in size and number of peripheral nodular pulmonary opacities. Left IJ central venous catheter placement to the mid SVC. No pneumothorax. Heart size and mediastinal contours are within normal limits. No effusion. Visualized bones unremarkable. IMPRESSION: 1. Worsening bilateral nodular pulmonary opacities. 2. Central line to the SVC without pneumothorax. Electronically Signed   By: Corlis Leak M.D.   On: 03/31/2021 10:51     Imaging independently reviewed in Epic.    [facility] [phone] pager 04/01/2021, 11:01 AM

## 2021-04-02 MED ORDER — LOPERAMIDE HCL 2 MG PO CAPS
2.0000 mg | ORAL_CAPSULE | ORAL | Status: DC | PRN
  Administered 2021-04-02 – 2021-04-04 (×2): 2 mg via ORAL
  Filled 2021-04-02 (×2): qty 1

## 2021-04-02 MED ORDER — TRAZODONE HCL 50 MG PO TABS
50.0000 mg | ORAL_TABLET | Freq: Every day | ORAL | Status: DC
  Administered 2021-04-02 – 2021-04-03 (×2): 50 mg via ORAL
  Filled 2021-04-02: qty 1

## 2021-04-02 NOTE — Plan of Care (Signed)
Patient refuses to bathe or allow CHG baths
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
## 2021-04-03 LAB — CULTURE, BLOOD (ROUTINE X 2)

## 2021-04-03 LAB — ECHO INTRAOPERATIVE TEE
AV Mean grad: 6 mmHg
AV Peak grad: 11 mmHg
Ao pk vel: 1.66 m/s
Height: 72 in
Weight: 2500.8 oz

## 2021-04-03 LAB — AEROBIC/ANAEROBIC CULTURE W GRAM STAIN (SURGICAL/DEEP WOUND)

## 2021-04-03 MED ORDER — ENSURE ENLIVE PO LIQD
237.0000 mL | Freq: Two times a day (BID) | ORAL | Status: DC
  Administered 2021-04-05 – 2021-04-25 (×34): 237 mL via ORAL
  Filled 2021-04-03 (×4): qty 237

## 2021-04-03 MED ORDER — SODIUM CHLORIDE 0.9 % IV SOLN: INTRAVENOUS | Status: DC | PRN
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
## 2021-04-04 LAB — BASIC METABOLIC PANEL
Anion gap: 9 (ref 5–15)
BUN: 15 mg/dL (ref 6–20)
CO2: 19 mmol/L — ABNORMAL LOW (ref 22–32)
Calcium: 7.7 mg/dL — ABNORMAL LOW (ref 8.9–10.3)
Chloride: 102 mmol/L (ref 98–111)
Creatinine, Ser: 0.53 mg/dL — ABNORMAL LOW (ref 0.61–1.24)
GFR, Estimated: >60 mL/min (ref >60)
Glucose, Bld: 95 mg/dL (ref 70–99)
Potassium: 4.5 mmol/L (ref 3.5–5.1)
Sodium: 130 mmol/L — ABNORMAL LOW (ref 135–145)

## 2021-04-04 LAB — MAGNESIUM: Magnesium: 1.8 mg/dL (ref 1.7–2.4)

## 2021-04-04 LAB — CBC
HCT: 28.4 % — ABNORMAL LOW (ref 39.0–52.0)
Hemoglobin: 9.5 g/dL — ABNORMAL LOW (ref 13.0–17.0)
MCH: 28.3 pg (ref 26.0–34.0)
MCHC: 33.5 g/dL (ref 30.0–36.0)
MCV: 84.5 fL (ref 80.0–100.0)
Platelets: 460 10*3/uL — ABNORMAL HIGH (ref 150–400)
RBC: 3.36 MIL/uL — ABNORMAL LOW (ref 4.22–5.81)
RDW: 13.2 % (ref 11.5–15.5)
WBC: 24.8 10*3/uL — ABNORMAL HIGH (ref 4.0–10.5)
nRBC: 0 % (ref 0.0–0.2)

## 2021-04-04 LAB — C-REACTIVE PROTEIN: CRP: 11.7 mg/dL — ABNORMAL HIGH (ref <1.0)

## 2021-04-04 MED ORDER — MAGNESIUM SULFATE 2 GM/50ML IV SOLN
2.0000 g | Freq: Once | INTRAVENOUS | Status: AC
  Administered 2021-04-04: 2 g via INTRAVENOUS
  Filled 2021-04-04: qty 50

## 2021-04-04 MED ORDER — TRAZODONE HCL 50 MG PO TABS
25.0000 mg | ORAL_TABLET | Freq: Every evening | ORAL | Status: DC | PRN
  Administered 2021-04-04 – 2021-04-07 (×3): 25 mg via ORAL
  Filled 2021-04-04 (×3): qty 1

## 2021-04-04 NOTE — Progress Notes (Signed)
[facility]  Date of Admission:  03/25/2021      Total days of antibiotics 10   Vancomycin            ASSESSMENT: Duane Price is a 35 y.o. 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 Cultures from OR with heavy growth MRSA (S-doxy/bactrim).  CT chest with multifocal peripheral opacities bilaterally, some that are cavitating.   No fevers overnight with stable leukocytosis 24.8k today. Tachycardic in 120s when he wakes on telemetry.  He is resting quietly and breathing comfortably on room air. Continue vancomycin IV. Blood cultures clear following debridement of vegetation 7/07. Not a candidate for home IV therapy give active drug use (amphetamines and cocaine). Will continue to follow up intermittently while inpatient. Anticipated D/C Aug 16th unless he has significant improvement earlier or desires earlier discharge. If he desires discharge prematurely, please contact us for recommendations to continue outpatient PO antibiotics for treatment.   Hepatis C RNA is negative indicating he has already achieved spontaneous cure of this infection. Nothing further needed aside from screening with repeated RNA testing in the future if he remains at risk for re-infection through IV drug use. 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 Will check Hep b sAb to see if we need to vaccinate him.    PLAN: Continue vancomycin IV Follow serum creatinine at 2 x weekly minimum No need for hepatitis c treatment  Hep B sAb in AM to determine vaccine benefit.    Principal Problem:   MRSA bacteremia Active Problems:   Septic embolism (HCC)   Normocytic anemia   IV drug abuse (HCC)   Endocarditis of tricuspid valve   HCV antibody positive    enoxaparin (LOVENOX) injection  40 mg Subcutaneous Q24H   feeding supplement  237 mL Oral BID BM   pantoprazole  40 mg Oral Daily   sodium  chloride flush  10-40 mL Intracatheter Q12H    SUBJECTIVE: Asking for more ativan - having a lot of anxiety and trouble sleeping.  No new concerns from over weekend.    Review of Systems: Review of Systems  Constitutional:  Negative for chills and fever.  HENT:  Negative for tinnitus.   Eyes:  Negative for photophobia.  Respiratory:  Negative for cough.   Cardiovascular:  Negative for chest pain.  Gastrointestinal:  Negative for diarrhea, nausea and vomiting.  Genitourinary:  Negative for dysuria.  Skin:  Negative for rash.  Neurological:  Negative for headaches.  Psychiatric/Behavioral:  Positive for sleep disturbance. The patient is nervous/anxious.     No Known Allergies  OBJECTIVE: Vitals:   04/03/21 0449 04/03/21 1524 04/03/21 2015 04/04/21 0434  BP: 132/83 135/71 134/79 132/79  Pulse: (!) 108 (!) 105 90 (!) 110  Resp: 15 16 16 20   Temp: 98.3 F (36.8 C) 99.9 F (37.7 C) 98.5 F (36.9 C) 98.5 F (36.9 C)  TempSrc: Axillary Oral Oral Oral  SpO2: 99% 98% 99% 100%  Weight:      Height:       Body mass index is 21.2 kg/m.  Physical Exam Vitals reviewed.  Constitutional:      Appearance: He is well-developed.     Comments: Sleeping in bed. Awakens easily   HENT:     Mouth/Throat:     Dentition: Normal dentition. No dental abscesses.  Cardiovascular:     Rate and Rhythm:  Regular rhythm. Tachycardia present.  Pulmonary:     Effort: Pulmonary effort is normal.     Breath sounds: Normal breath sounds.  Abdominal:     General: There is no distension.     Palpations: Abdomen is soft.     Tenderness: There is no abdominal tenderness.  Lymphadenopathy:     Cervical: No cervical adenopathy.  Skin:    General: Skin is warm and dry.     Findings: No rash.  Neurological:     Mental Status: He is alert and oriented to person, place, and time.  Psychiatric:        Judgment: Judgment normal.    Lab Results Lab Results  Component Value Date   WBC 24.8 (H)  04/04/2021   HGB 9.5 (L) 04/04/2021   HCT 28.4 (L) 04/04/2021   MCV 84.5 04/04/2021   PLT 460 (H) 04/04/2021    Lab Results  Component Value Date   CREATININE 0.53 (L) 04/04/2021   BUN 15 04/04/2021   NA 130 (L) 04/04/2021   K 4.5 04/04/2021   CL 102 04/04/2021   CO2 19 (L) 04/04/2021    Lab Results  Component Value Date   ALT 14 03/30/2021   AST 16 03/30/2021   ALKPHOS 70 03/30/2021   BILITOT 0.5 03/30/2021     Microbiology: Recent Results (from the past 240 hour(s))  Surgical pcr screen     Status: Abnormal   Collection Time: 03/29/21  5:20 AM   Specimen: Nasal Mucosa; Nasal Swab  Result Value Ref Range Status   MRSA, PCR POSITIVE (A) NEGATIVE Final    Comment: RESULT CALLED TO, READ BACK BY AND VERIFIED WITH: DJoesphine Bare RN, AT 3009 03/29/21 D. VANHOOK    Staphylococcus aureus POSITIVE (A) NEGATIVE Final    Comment: (NOTE) The Xpert SA Assay (FDA approved for NASAL specimens in patients 59 years of age and older), is one component of a comprehensive surveillance program. It is not intended to diagnose infection nor to guide or monitor treatment. Performed at [facility], [address], [place], [place] [address]   Culture, blood (routine x 2)     Status: Abnormal   Collection Time: 03/29/21 10:00 AM   Specimen: BLOOD LEFT HAND  Result Value Ref Range Status   Specimen Description BLOOD LEFT HAND  Final   Special Requests   Final    BOTTLES DRAWN AEROBIC AND ANAEROBIC Blood Culture results may not be optimal due to an inadequate volume of blood received in culture bottles   Culture  Setup Time   Final    GRAM POSITIVE COCCI IN CLUSTERS ANAEROBIC BOTTLE ONLY Organism ID to follow CRITICAL RESULT CALLED TO, READ BACK BY AND VERIFIED WITHKelby Fam [facility] [id number] 03/30/21 A BROWNING Performed at [facility], [address], [place], [place] [address]    Culture METHICILLIN RESISTANT STAPHYLOCOCCUS AUREUS (A)  Final   Report Status 04/01/2021 FINAL  Final    Organism ID, Bacteria METHICILLIN RESISTANT STAPHYLOCOCCUS AUREUS  Final      Susceptibility   Methicillin resistant staphylococcus aureus - MIC*    CIPROFLOXACIN >=8 RESISTANT Resistant     ERYTHROMYCIN >=8 RESISTANT Resistant     GENTAMICIN <=0.5 SENSITIVE Sensitive     OXACILLIN >=4 RESISTANT Resistant     TETRACYCLINE <=1 SENSITIVE Sensitive     VANCOMYCIN 1 SENSITIVE Sensitive     TRIMETH/SULFA <=10 SENSITIVE Sensitive     CLINDAMYCIN <=0.25 SENSITIVE Sensitive  RIFAMPIN <=0.5 SENSITIVE Sensitive     Inducible Clindamycin NEGATIVE Sensitive     * METHICILLIN RESISTANT STAPHYLOCOCCUS AUREUS  Blood Culture ID Panel (Reflexed)     Status: Abnormal   Collection Time: 03/29/21 10:00 AM  Result Value Ref Range Status   Enterococcus faecalis NOT DETECTED NOT DETECTED Final   Enterococcus Faecium NOT DETECTED NOT DETECTED Final   Listeria monocytogenes NOT DETECTED NOT DETECTED Final   Staphylococcus species DETECTED (A) NOT DETECTED Final    Comment: CRITICAL RESULT CALLED TO, READ BACK BY AND VERIFIED WITHKelby Fam PHARMD 1811 03/30/21 A BROWNING    Staphylococcus aureus (BCID) DETECTED (A) NOT DETECTED Final    Comment: Methicillin (oxacillin)-resistant Staphylococcus aureus (MRSA). MRSA is predictably resistant to beta-lactam antibiotics (except ceftaroline). Preferred therapy is vancomycin unless clinically contraindicated. Patient requires contact precautions if  hospitalized. CRITICAL RESULT CALLED TO, READ BACK BY AND VERIFIED WITH: Kelby Fam PHARMD 1610 03/30/21 A BROWNING    Staphylococcus epidermidis NOT DETECTED NOT DETECTED Final   Staphylococcus lugdunensis NOT DETECTED NOT DETECTED Final   Streptococcus species NOT DETECTED NOT DETECTED Final   Streptococcus agalactiae NOT DETECTED NOT DETECTED Final   Streptococcus pneumoniae NOT DETECTED NOT DETECTED Final   Streptococcus pyogenes NOT DETECTED NOT DETECTED Final   A.calcoaceticus-baumannii NOT DETECTED NOT DETECTED  Final   Bacteroides fragilis NOT DETECTED NOT DETECTED Final   Enterobacterales NOT DETECTED NOT DETECTED Final   Enterobacter cloacae complex NOT DETECTED NOT DETECTED Final   Escherichia coli NOT DETECTED NOT DETECTED Final   Klebsiella aerogenes NOT DETECTED NOT DETECTED Final   Klebsiella oxytoca NOT DETECTED NOT DETECTED Final   Klebsiella pneumoniae NOT DETECTED NOT DETECTED Final   Proteus species NOT DETECTED NOT DETECTED Final   Salmonella species NOT DETECTED NOT DETECTED Final   Serratia marcescens NOT DETECTED NOT DETECTED Final   Haemophilus influenzae NOT DETECTED NOT DETECTED Final   Neisseria meningitidis NOT DETECTED NOT DETECTED Final   Pseudomonas aeruginosa NOT DETECTED NOT DETECTED Final   Stenotrophomonas maltophilia NOT DETECTED NOT DETECTED Final   Candida albicans NOT DETECTED NOT DETECTED Final   Candida auris NOT DETECTED NOT DETECTED Final   Candida glabrata NOT DETECTED NOT DETECTED Final   Candida krusei NOT DETECTED NOT DETECTED Final   Candida parapsilosis NOT DETECTED NOT DETECTED Final   Candida tropicalis NOT DETECTED NOT DETECTED Final   Cryptococcus neoformans/gattii NOT DETECTED NOT DETECTED Final   Meth resistant mecA/C and MREJ DETECTED (A) NOT DETECTED Final    Comment: CRITICAL RESULT CALLED TO, READ BACK BY AND VERIFIED WITHKelby Fam [facility] 9604 03/30/21 A BROWNING Performed at [facility], [address] [place], [place] [address]   Culture, blood (routine x 2)     Status: Abnormal   Collection Time: 03/29/21 11:30 AM   Specimen: BLOOD LEFT HAND  Result Value Ref Range Status   Specimen Description BLOOD LEFT HAND  Final   Special Requests   Final    BOTTLES DRAWN AEROBIC AND ANAEROBIC Blood Culture results may not be optimal due to an inadequate volume of blood received in culture bottles   Culture  Setup Time   Final    GRAM POSITIVE COCCI IN CLUSTERS ANAEROBIC BOTTLE ONLY CRITICAL VALUE NOTED.  VALUE IS CONSISTENT WITH  PREVIOUSLY REPORTED AND CALLED VALUE.    Culture (A)  Final    STAPHYLOCOCCUS AUREUS SUSCEPTIBILITIES PERFORMED ON PREVIOUS CULTURE WITHIN THE LAST 5 DAYS. Performed at [facility], [address], [place], [place] [address]    Report Status 04/03/2021 FINAL  Final  Fungus Culture With Stain     Status: None (Preliminary result)   Collection Time: 03/29/21  4:32 PM   Specimen: Heart Valve; Tissue  Result Value Ref Range Status   Fungus Stain Final report  Final    Comment: (NOTE) Performed At: [facility] [address] [place], [place] [id number] Jolene Schimke MD XB:[id number]    Fungus (Mycology) Culture PENDING  Incomplete   Fungal Source TISSUE  Final    Comment: TRICUSPID VEGETATION Performed at [facility], [address] [place], [place] [address]   Aerobic/Anaerobic Culture w Gram Stain (surgical/deep wound)     Status: None   Collection Time: 03/29/21  4:32 PM   Specimen: Heart Valve; Tissue  Result Value Ref Range Status   Specimen Description TISSUE  Final   Special Requests TRICUSPID VEGETATION  Final   Culture   Final    ABUNDANT METHICILLIN RESISTANT STAPHYLOCOCCUS AUREUS NO ANAEROBES ISOLATED CRITICAL RESULT CALLED TO, READ BACK BY AND VERIFIED WITH: RN C.DAVIS AT 1055 ON 04/01/2021 BY T.SAAD. Performed at [facility], [address], [place], [place] [address]    Report Status 04/03/2021 FINAL  Final   Organism ID, Bacteria METHICILLIN RESISTANT STAPHYLOCOCCUS AUREUS  Final      Susceptibility   Methicillin resistant staphylococcus aureus - MIC*    CIPROFLOXACIN >=8 RESISTANT Resistant     ERYTHROMYCIN >=8 RESISTANT Resistant     GENTAMICIN <=0.5 SENSITIVE Sensitive     OXACILLIN >=4 RESISTANT Resistant     TETRACYCLINE <=1 SENSITIVE Sensitive     VANCOMYCIN 1 SENSITIVE Sensitive     TRIMETH/SULFA <=10 SENSITIVE Sensitive     CLINDAMYCIN <=0.25 SENSITIVE Sensitive     RIFAMPIN <=0.5 SENSITIVE Sensitive     Inducible  Clindamycin NEGATIVE Sensitive     * ABUNDANT METHICILLIN RESISTANT STAPHYLOCOCCUS AUREUS  Acid Fast Smear (AFB)     Status: None   Collection Time: 03/29/21  4:32 PM   Specimen: Heart Valve; Tissue  Result Value Ref Range Status   AFB Specimen Processing Concentration  Final   Acid Fast Smear Negative  Final    Comment: (NOTE) Performed At: [facility] [address] [place], [place] [id number] Jolene Schimke MD KG:[id number]    Source (AFB) TISSUE  Final    Comment: TRICUSPID VEGETATION Performed at [facility], [address], [place], [place] [address]   Fungus Culture Result     Status: None   Collection Time: 03/29/21  4:32 PM  Result Value Ref Range Status   Result 1 Comment  Final    Comment: (NOTE) KOH/Calcofluor preparation:  no fungus observed. Performed At: [facility] [address] [place], [place] [id number] Jolene Schimke MD OV:[id number]   Culture, blood (routine x 2)     Status: None (Preliminary result)   Collection Time: 03/31/21 10:25 AM   Specimen: BLOOD LEFT HAND  Result Value Ref Range Status   Specimen Description BLOOD LEFT HAND  Final   Special Requests   Final    BOTTLES DRAWN AEROBIC AND ANAEROBIC Blood Culture adequate volume   Culture   Final    NO GROWTH 4 DAYS Performed at [facility], [address] [place], [place] [address]    Report Status PENDING  Incomplete  Culture, blood (routine x 2)     Status: None (Preliminary result)   Collection Time: 03/31/21 10:28  AM   Specimen: BLOOD LEFT HAND  Result Value Ref Range Status   Specimen Description BLOOD LEFT HAND  Final   Special Requests   Final    BOTTLES DRAWN AEROBIC AND ANAEROBIC Blood Culture adequate volume   Culture   Final    NO GROWTH 4 DAYS Performed at [facility], [address] [place], [place] [id number]    Report Status PENDING  Incomplete    Rexene AlbertsStephanie Dari Carpenito, MSN, NP-C [facility]  [email or web address] Pager: [phone] Office: [phone] [facility] Main Line: [phone]
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
## 2021-04-05 LAB — CULTURE, BLOOD (ROUTINE X 2)
Culture: NO GROWTH
Culture: NO GROWTH
Special Requests: ADEQUATE
Special Requests: ADEQUATE

## 2021-04-05 LAB — VANCOMYCIN, TROUGH: Vancomycin Tr: 7 ug/mL — ABNORMAL LOW (ref 15–20)
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
## 2021-04-05 NOTE — Progress Notes (Signed)
Pharmacy Antibiotic Note  Duane Price is a 35 y.o. male admitted on 03/25/2021 with MRSA endocarditis w/ TV vegetation, septic emboli.  Pharmacy has been consulted for vancomycin.  Recent levels on 7/3 were therapeutic. Cultures on 7/5 positive for MRSA, cultures from 7/7 are no growth. Scr normal at 0.5, wbc stable at 24.8.   Vancomycin this morning is down this morning to 7, patient appears to be clearing vancomycin at a faster rate than last week. Will check peak and trough tomorrow to reassess dosing.  Plan: Continue Vancomycin 1250mg  IV Q12h for now Recheck peak tonight and trough in am F/u repeat Bcx, ID recs, clinical pic    Height: 6' (182.9 cm) Weight: 70.9 kg (156 lb 4.8 oz) IBW/kg (Calculated) : 77.6  Temp (24hrs), Avg:98.8 F (37.1 C), Min:98.4 F (36.9 C), Max:99.2 F (37.3 C)  Recent Labs  Lab 03/30/21 0500 03/31/21 0810 04/01/21 0820 04/04/21 0155 04/04/21 0618 04/05/21 0903  WBC 26.2* 23.8*  --   --  24.8*  --   CREATININE 0.72 0.71 0.66 0.53*  --   --   VANCOTROUGH  --   --   --   --   --  7*     Estimated Creatinine Clearance: 132.9 mL/min (A) (by C-G formula based on SCr of 0.53 mg/dL (L)).    No Known Allergies  Antimicrobials this admission: Cefepime 7/1 >>7/2 Vancomycin 7/1 >>   Microbiology results: 7/7 Bcx: ng 7/5 Tissue veg cx: MRSA 7/5 Bcx: 1/4 MRSA 7/1 BCx: 3/3 staph aureus, MecA+, MREJ+ 7/1 UA mod Hgb, + nitrite, specific gravity >1 7/1 HCV ab reactive, HIV neg, hep A/B neg  9/1 PharmD., BCPS Clinical Pharmacist 04/05/2021 12:30 PM  Please check AMION for all [facility] Pharmacy phone numbers After 10:00 PM, call Main Pharmacy [phone]

## 2021-04-06 LAB — HEPATITIS B SURFACE ANTIBODY, QUANTITATIVE: Hep B S AB Quant (Post): >1000 m[IU]/mL (ref >9.9)

## 2021-04-06 LAB — VANCOMYCIN, PEAK: Vancomycin Pk: 17 ug/mL — ABNORMAL LOW (ref 30–40)

## 2021-04-06 LAB — VANCOMYCIN, TROUGH: Vancomycin Tr: 16 ug/mL (ref 15–20)

## 2021-04-06 MED ORDER — NICOTINE POLACRILEX 2 MG MT GUM
2.0000 mg | CHEWING_GUM | OROMUCOSAL | Status: DC | PRN
  Filled 2021-04-06: qty 1

## 2021-04-06 MED ORDER — ACETAMINOPHEN 325 MG PO TABS
650.0000 mg | ORAL_TABLET | Freq: Four times a day (QID) | ORAL | Status: DC | PRN
  Administered 2021-04-09 – 2021-04-24 (×9): 650 mg via ORAL
  Filled 2021-04-06 (×9): qty 2

## 2021-04-06 MED ORDER — HYDROMORPHONE HCL 2 MG PO TABS
3.0000 mg | ORAL_TABLET | ORAL | Status: DC | PRN
  Administered 2021-04-06 – 2021-04-12 (×27): 3 mg via ORAL
  Filled 2021-04-06 (×28): qty 2

## 2021-04-06 MED ORDER — KETOROLAC TROMETHAMINE 15 MG/ML IJ SOLN
15.0000 mg | Freq: Four times a day (QID) | INTRAMUSCULAR | Status: DC
  Administered 2021-04-06 – 2021-04-07 (×5): 15 mg via INTRAVENOUS
  Filled 2021-04-06 (×5): qty 1

## 2021-04-06 MED ORDER — ACETAMINOPHEN 500 MG PO TABS
1000.0000 mg | ORAL_TABLET | Freq: Three times a day (TID) | ORAL | Status: AC
  Administered 2021-04-06 – 2021-04-09 (×9): 1000 mg via ORAL
  Filled 2021-04-06 (×9): qty 2

## 2021-04-06 MED ORDER — NICOTINE 21 MG/24HR TD PT24
21.0000 mg | MEDICATED_PATCH | Freq: Every day | TRANSDERMAL | Status: DC
  Administered 2021-04-06 – 2021-04-25 (×20): 21 mg via TRANSDERMAL
  Filled 2021-04-06 (×21): qty 1

## 2021-04-06 NOTE — Progress Notes (Signed)
Pharmacy Antibiotic Note  Duane Price is a 35 y.o. male admitted on 03/25/2021 with MRSA endocarditis w/ TV vegetation, septic emboli.  Pharmacy has been consulted for vancomycin.  Recent levels on 7/3 were therapeutic. Cultures on 7/5 positive for MRSA, cultures from 7/7 are no growth. Scr normal at 0.5, wbc stable at 24.8 on 7/11.  Vancomycin trough was low yesterday 7/12 at 7. Decided to check peak (17) and trough (16) levels today.   Calculated AUC 403, Ke 0.0069. AUC within goal, trough level appears appropriate will continue current dosing.   Given unusual lab results will consider rechecking levels and bmet in a few days.   Plan: Continue Vancomycin 1250mg  IV Q12h for now F/u repeat Bcx, ID recs    Height: 6' (182.9 cm) Weight: 70.9 kg (156 lb 4.8 oz) IBW/kg (Calculated) : 77.6  Temp (24hrs), Avg:98.6 F (37 C), Min:98.5 F (36.9 C), Max:98.7 F (37.1 C)  Recent Labs  Lab 03/31/21 0810 04/01/21 0820 04/04/21 0155 04/04/21 0618 04/05/21 0903 04/06/21 0058 04/06/21 0943  WBC 23.8*  --   --  24.8*  --   --   --   CREATININE 0.71 0.66 0.53*  --   --   --   --   VANCOTROUGH  --   --   --   --  7*  --  16  VANCOPEAK  --   --   --   --   --  17*  --      Estimated Creatinine Clearance: 132.9 mL/min (A) (by C-G formula based on SCr of 0.53 mg/dL (L)).    No Known Allergies  Antimicrobials this admission: Cefepime 7/1 >>7/2 Vancomycin 7/1 >>   Microbiology results: 7/7 Bcx: ng 7/5 Tissue veg cx: MRSA 7/5 Bcx: 1/4 MRSA 7/1 BCx: 3/3 staph aureus, MecA+, MREJ+ 7/1 UA mod Hgb, + nitrite, specific gravity >1 7/1 HCV ab reactive, HIV neg, hep A/B neg  9/1 PharmD., BCPS Clinical Pharmacist 04/06/2021 3:07 PM  Please check AMION for all [facility] Pharmacy phone numbers After 10:00 PM, call Main Pharmacy [phone]
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
## 2021-04-07 LAB — COMPREHENSIVE METABOLIC PANEL
ALT: 21 U/L (ref 0–44)
AST: 10 U/L — ABNORMAL LOW (ref 15–41)
Albumin: 1.7 g/dL — ABNORMAL LOW (ref 3.5–5.0)
Alkaline Phosphatase: 101 U/L (ref 38–126)
Anion gap: 5 (ref 5–15)
BUN: 11 mg/dL (ref 6–20)
CO2: 26 mmol/L (ref 22–32)
Calcium: 8 mg/dL — ABNORMAL LOW (ref 8.9–10.3)
Chloride: 102 mmol/L (ref 98–111)
Creatinine, Ser: 0.66 mg/dL (ref 0.61–1.24)
GFR, Estimated: >60 mL/min (ref >60)
Glucose, Bld: 104 mg/dL — ABNORMAL HIGH (ref 70–99)
Potassium: 3.9 mmol/L (ref 3.5–5.1)
Sodium: 133 mmol/L — ABNORMAL LOW (ref 135–145)
Total Bilirubin: 0.3 mg/dL (ref 0.3–1.2)
Total Protein: 6.3 g/dL — ABNORMAL LOW (ref 6.5–8.1)

## 2021-04-07 LAB — CBC WITH DIFFERENTIAL/PLATELET
Abs Immature Granulocytes: 0.05 10*3/uL (ref 0.00–0.07)
Basophils Absolute: 0 10*3/uL (ref 0.0–0.1)
Basophils Relative: 0 %
Eosinophils Absolute: 0.1 10*3/uL (ref 0.0–0.5)
Eosinophils Relative: 1 %
HCT: 24.8 % — ABNORMAL LOW (ref 39.0–52.0)
Hemoglobin: 8.4 g/dL — ABNORMAL LOW (ref 13.0–17.0)
Immature Granulocytes: 0 %
Lymphocytes Relative: 17 %
Lymphs Abs: 2.1 10*3/uL (ref 0.7–4.0)
MCH: 28.8 pg (ref 26.0–34.0)
MCHC: 33.9 g/dL (ref 30.0–36.0)
MCV: 84.9 fL (ref 80.0–100.0)
Monocytes Absolute: 0.7 10*3/uL (ref 0.1–1.0)
Monocytes Relative: 6 %
Neutro Abs: 9.4 10*3/uL — ABNORMAL HIGH (ref 1.7–7.7)
Neutrophils Relative %: 76 %
Platelets: 406 10*3/uL — ABNORMAL HIGH (ref 150–400)
RBC: 2.92 MIL/uL — ABNORMAL LOW (ref 4.22–5.81)
RDW: 13.3 % (ref 11.5–15.5)
WBC: 12.5 10*3/uL — ABNORMAL HIGH (ref 4.0–10.5)
nRBC: 0 % (ref 0.0–0.2)

## 2021-04-07 LAB — PHOSPHORUS: Phosphorus: 3.8 mg/dL (ref 2.5–4.6)

## 2021-04-07 LAB — MAGNESIUM: Magnesium: 2.1 mg/dL (ref 1.7–2.4)

## 2021-04-07 MED ORDER — KETOROLAC TROMETHAMINE 30 MG/ML IJ SOLN
30.0000 mg | Freq: Four times a day (QID) | INTRAMUSCULAR | Status: AC
  Administered 2021-04-07 – 2021-04-11 (×16): 30 mg via INTRAVENOUS
  Filled 2021-04-07 (×16): qty 1
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
## 2021-04-08 ENCOUNTER — Ambulatory Visit: Payer: Self-pay | Admitting: Thoracic Surgery (Cardiothoracic Vascular Surgery)

## 2021-04-08 LAB — CBC WITH DIFFERENTIAL/PLATELET
Abs Immature Granulocytes: 0.06 10*3/uL (ref 0.00–0.07)
Basophils Absolute: 0 10*3/uL (ref 0.0–0.1)
Basophils Relative: 0 %
Eosinophils Absolute: 0.1 10*3/uL (ref 0.0–0.5)
Eosinophils Relative: 1 %
HCT: 24.3 % — ABNORMAL LOW (ref 39.0–52.0)
Hemoglobin: 7.9 g/dL — ABNORMAL LOW (ref 13.0–17.0)
Immature Granulocytes: 1 %
Lymphocytes Relative: 18 %
Lymphs Abs: 2.2 10*3/uL (ref 0.7–4.0)
MCH: 28.5 pg (ref 26.0–34.0)
MCHC: 32.5 g/dL (ref 30.0–36.0)
MCV: 87.7 fL (ref 80.0–100.0)
Monocytes Absolute: 0.7 10*3/uL (ref 0.1–1.0)
Monocytes Relative: 6 %
Neutro Abs: 9 10*3/uL — ABNORMAL HIGH (ref 1.7–7.7)
Neutrophils Relative %: 74 %
Platelets: 437 10*3/uL — ABNORMAL HIGH (ref 150–400)
RBC: 2.77 MIL/uL — ABNORMAL LOW (ref 4.22–5.81)
RDW: 13.8 % (ref 11.5–15.5)
WBC: 12.1 10*3/uL — ABNORMAL HIGH (ref 4.0–10.5)
nRBC: 0 % (ref 0.0–0.2)

## 2021-04-08 LAB — COMPREHENSIVE METABOLIC PANEL
ALT: 17 U/L (ref 0–44)
AST: 10 U/L — ABNORMAL LOW (ref 15–41)
Albumin: 1.7 g/dL — ABNORMAL LOW (ref 3.5–5.0)
Alkaline Phosphatase: 100 U/L (ref 38–126)
Anion gap: 5 (ref 5–15)
BUN: 13 mg/dL (ref 6–20)
CO2: 24 mmol/L (ref 22–32)
Calcium: 7.9 mg/dL — ABNORMAL LOW (ref 8.9–10.3)
Chloride: 105 mmol/L (ref 98–111)
Creatinine, Ser: 0.69 mg/dL (ref 0.61–1.24)
GFR, Estimated: >60 mL/min (ref >60)
Glucose, Bld: 121 mg/dL — ABNORMAL HIGH (ref 70–99)
Potassium: 3.8 mmol/L (ref 3.5–5.1)
Sodium: 134 mmol/L — ABNORMAL LOW (ref 135–145)
Total Bilirubin: 0.3 mg/dL (ref 0.3–1.2)
Total Protein: 6.5 g/dL (ref 6.5–8.1)

## 2021-04-08 LAB — MAGNESIUM: Magnesium: 1.8 mg/dL (ref 1.7–2.4)

## 2021-04-08 LAB — PHOSPHORUS: Phosphorus: 4 mg/dL (ref 2.5–4.6)

## 2021-04-08 MED ORDER — TRAZODONE HCL 100 MG PO TABS
100.0000 mg | ORAL_TABLET | Freq: Every evening | ORAL | Status: DC | PRN
  Administered 2021-04-08 – 2021-04-12 (×5): 100 mg via ORAL
  Filled 2021-04-08 (×5): qty 1

## 2021-04-08 MED ORDER — HYDROXYZINE HCL 25 MG PO TABS
25.0000 mg | ORAL_TABLET | Freq: Three times a day (TID) | ORAL | Status: DC | PRN
  Administered 2021-04-08 – 2021-04-19 (×8): 25 mg via ORAL
  Filled 2021-04-08 (×8): qty 1

## 2021-04-08 NOTE — Progress Notes (Signed)
Brief ID note:  Patient chart reviewed.  He remains on vancomycin for MRSA bacteremia complicated by tricuspid valve endocarditis and septic pulmonary emboli status post angio vac debulking 03/29/2021.  His blood cultures are clear as of 7/7.  His serum creatinine has been normal and he has been stable on current dose of vancomycin being managed by pharmacy.  Will continue with current treatment plan and continue to follow intermittently.  Dr. Daiva Eves is available as needed over the weekend, otherwise, new ID team will take over on Monday.   Vedia Coffer for Infectious Disease [facility] 04/08/2021, 2:48 PM

## 2021-04-08 NOTE — Progress Notes (Signed)
Pharmacy Antibiotic Note  Duane Price is a 35 y.o. male admitted on 03/25/2021 with MRSA endocarditis w/ TV vegetation, septic emboli.  Pharmacy has been consulted for vancomycin (abx end date 8/16/22_ -Cultures on 7/5 positive for MRSA, cultures from 7/7 are negative. Scr normal at 0.69, wbc 12.1   Plan: Continue Vancomycin 1250mg  IV Q12 Recheck vancomycin levels in 2-3 days Will follow renal function and clinical progress   Height: 6' (182.9 cm) Weight: 70.9 kg (156 lb 4.8 oz) IBW/kg (Calculated) : 77.6  Temp (24hrs), Avg:97.6 F (36.4 C), Min:97.6 F (36.4 C), Max:97.6 F (36.4 C)  Recent Labs  Lab 04/04/21 0155 04/04/21 0618 04/05/21 0903 04/06/21 0058 04/06/21 0943 04/07/21 0214 04/08/21 0224  WBC  --  24.8*  --   --   --  12.5* 12.1*  CREATININE 0.53*  --   --   --   --  0.66 0.69  VANCOTROUGH  --   --  7*  --  16  --   --   VANCOPEAK  --   --   --  17*  --   --   --      Estimated Creatinine Clearance: 132.9 mL/min (by C-G formula based on SCr of 0.69 mg/dL).    No Known Allergies  Antimicrobials this admission: Cefepime 7/1 >>7/2 Vancomycin 7/1 >>   Microbiology results: 7/7 Bcx: ng 7/5 Tissue veg cx: MRSA 7/5 Bcx: 1/4 MRSA 7/1 BCx: 3/3 staph aureus, MecA+, MREJ+ 7/1 UA mod Hgb, + nitrite, specific gravity >1 7/1 HCV ab reactive, HIV neg, hep A/B neg  9/1, PharmD Clinical Pharmacist **Pharmacist phone directory can now be found on [email or web address] ([email or web address]).  Listed under [facility].
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
## 2021-04-08 NOTE — Evaluation (Signed)
Physical Therapy Evaluation Patient Details Name: Duane Price MRN: [id number] DOB: [birth year] Today's Date: 04/08/2021   History of Present Illness  35 y/o. male presenting to [facility] ED on 03/25/2021 with chest pain. Blood cultures reveal MRSA, echo reveals tricuspid valve vegetation. Imaging further reveals significant pulmonary embolism, likely septic in nature. PMH: IV drug use, hx of brain surgery.  Clinical Impression  Pt presents to PT with mild deficits in balance, endurance, and strength, but is able to complete all mobility required in a community setting independently. Pt requires no further acute PT services and is in agreement with plan to ambulate out of the room at least 3 times a day for the remainder of admission. If the pt demonstrates a decline in functional status please re-consult PT services. Acute PT signing off.    Follow Up Recommendations No PT follow up    Equipment Recommendations  None recommended by PT    Recommendations for Other Services       Precautions / Restrictions Precautions Precautions: None Restrictions Weight Bearing Restrictions: No      Mobility  Bed Mobility Overal bed mobility: Independent                  Transfers Overall transfer level: Independent                  Ambulation/Gait Ambulation/Gait assistance: Independent Gait Distance (Feet): 400 Feet Assistive device: None Gait Pattern/deviations: WFL(Within Functional Limits) Gait velocity: functional Gait velocity interpretation: >2.62 ft/sec, indicative of community ambulatory General Gait Details: pt with mild lateral drift intermittently with dynamic gait tasks. Pt completes head turns, quick turns, changes gait speed, stops abruptly, changes step length and height without loss of balance  Stairs Stairs: Yes Stairs assistance: Independent Stair Management: No rails;Alternating pattern Number of Stairs: 6    Wheelchair Mobility    Modified Rankin  (Stroke Patients Only)       Balance Overall balance assessment: Independent (pt holds SLS for >10 seconds, picks up object from floor from standing independently)                                           Pertinent Vitals/Pain Pain Assessment: No/denies pain    Home Living Family/patient expects to be discharged to:: Shelter/Homeless                 Additional Comments: Reports homeless; states "my brother can check in on me." Does not own DME    Prior Function Level of Independence: Independent               Hand Dominance        Extremity/Trunk Assessment   Upper Extremity Assessment Upper Extremity Assessment: Overall WFL for tasks assessed    Lower Extremity Assessment Lower Extremity Assessment: Overall WFL for tasks assessed    Cervical / Trunk Assessment Cervical / Trunk Assessment: Normal  Communication   Communication: No difficulties  Cognition Arousal/Alertness: Awake/alert Behavior During Therapy: WFL for tasks assessed/performed Overall Cognitive Status: Within Functional Limits for tasks assessed                                        General Comments General comments (skin integrity, edema, etc.): pt tachy up to 139 observed by PT, reports 4/10 fatigue  with activity    Exercises     Assessment/Plan    PT Assessment Patent does not need any further PT services  PT Problem List         PT Treatment Interventions      PT Goals (Current goals can be found in the Care Plan section)       Frequency     Barriers to discharge        Co-evaluation               AM-PAC PT "6 Clicks" Mobility  Outcome Measure Help needed turning from your back to your side while in a flat bed without using bedrails?: None Help needed moving from lying on your back to sitting on the side of a flat bed without using bedrails?: None Help needed moving to and from a bed to a chair (including a wheelchair)?:  None Help needed standing up from a chair using your arms (e.g., wheelchair or bedside chair)?: None Help needed to walk in hospital room?: None Help needed climbing 3-5 steps with a railing? : None 6 Click Score: 24    End of Session   Activity Tolerance: Patient tolerated treatment well Patient left: in bed;with call bell/phone within reach Nurse Communication: Mobility status      Time: 2549-8264 PT Time Calculation (min) (ACUTE ONLY): 15 min   Charges:   PT Evaluation $PT Eval Low Complexity: 1 Low          Arlyss Gandy, PT, DPT Acute Rehabilitation Pager: [phone]   Arlyss Gandy 04/08/2021, 9:43 AM
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
## 2021-04-10 ENCOUNTER — Inpatient Hospital Stay (HOSPITAL_COMMUNITY): Payer: Self-pay

## 2021-04-10 LAB — CBC
HCT: 23.9 % — ABNORMAL LOW (ref 39.0–52.0)
Hemoglobin: 7.8 g/dL — ABNORMAL LOW (ref 13.0–17.0)
MCH: 28.7 pg (ref 26.0–34.0)
MCHC: 32.6 g/dL (ref 30.0–36.0)
MCV: 87.9 fL (ref 80.0–100.0)
Platelets: 464 10*3/uL — ABNORMAL HIGH (ref 150–400)
RBC: 2.72 MIL/uL — ABNORMAL LOW (ref 4.22–5.81)
RDW: 14.9 % (ref 11.5–15.5)
WBC: 10 10*3/uL (ref 4.0–10.5)
nRBC: 0 % (ref 0.0–0.2)

## 2021-04-10 LAB — VANCOMYCIN, PEAK: Vancomycin Pk: 50 ug/mL — ABNORMAL HIGH (ref 30–40)

## 2021-04-10 LAB — VANCOMYCIN, TROUGH: Vancomycin Tr: 7 ug/mL — ABNORMAL LOW (ref 15–20)

## 2021-04-10 IMAGING — MR MR THORACIC SPINE WO/W CM
5 of 9 series · 24 of 48 positions shown · IV contrast (gadavist)
Comparison: None.

CLINICAL DATA: Bacteremia. Mid back pain. Rule out infection. IV
drug use.

EXAM:
MRI THORACIC WITHOUT AND WITH CONTRAST
TECHNIQUE: Multiplanar and multiecho pulse sequences of the thoracic spine were
obtained without and with intravenous contrast.
CONTRAST:  7mL GADAVIST GADOBUTROL 1 MMOL/ML IV SOLN

[Series 17: T1 · sagittal · 6.0mm · 1.23mm/px · 1 of 9 slices shown (1 of 3)]
[im 1/9]
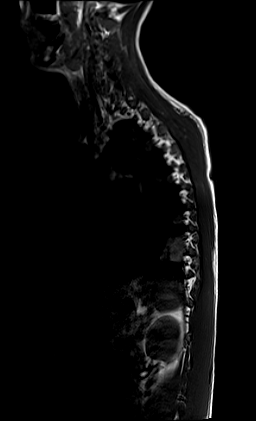

[Series 18: T2 · sagittal · 3.0mm · 0.76mm/px · 3 of 17 slices shown (1 of 2)]
[im 1/17]
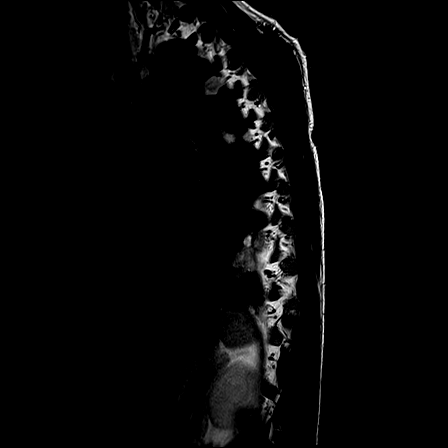
[im 9/17]
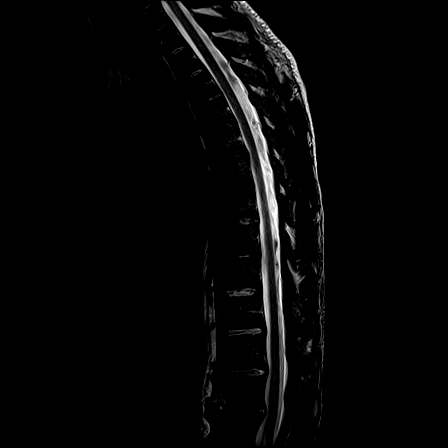
[im 17/17]
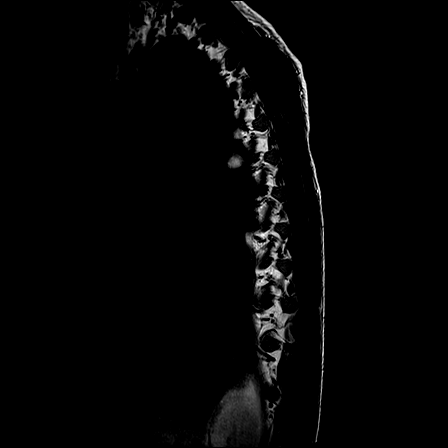

[Series 19: T1 · sagittal · 3.0mm · 0.76mm/px · 4 of 17 slices shown (2 of 3)]
[im 1/17]
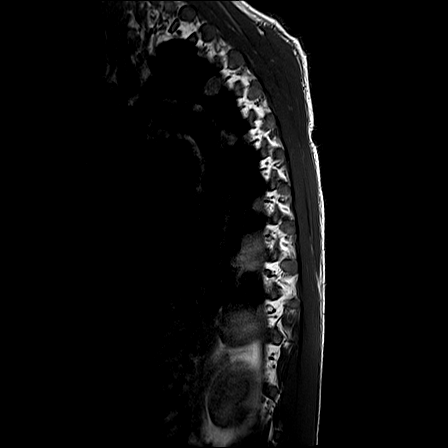
[im 6/17]
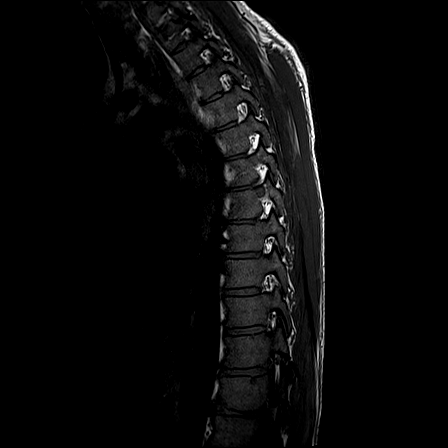
[im 11/17]
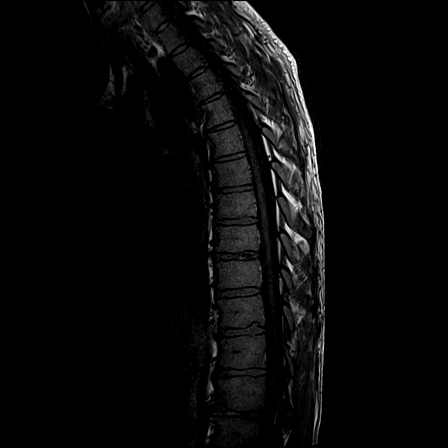
[im 17/17]
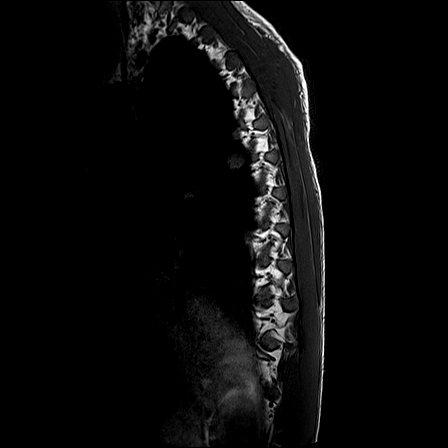

[Series 21: T2 · axial · 4.0mm · 0.59mm/px · z∈[-165,+98]mm · 8 of 39 slices shown (2 of 2)]
[im 1/39]
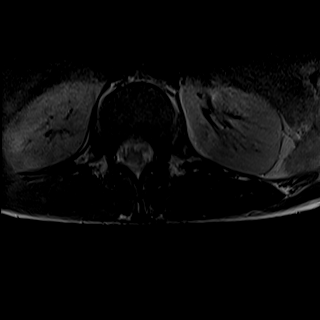
[im 6/39]
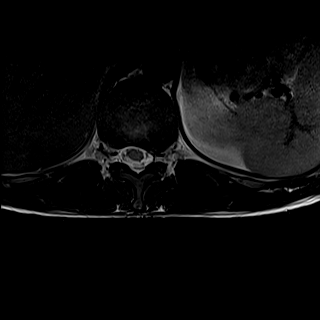
[im 11/39]
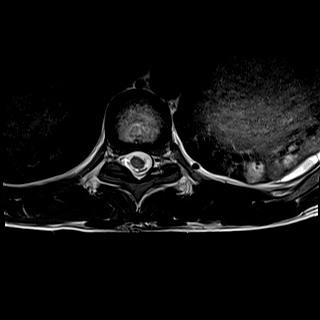
[im 17/39]
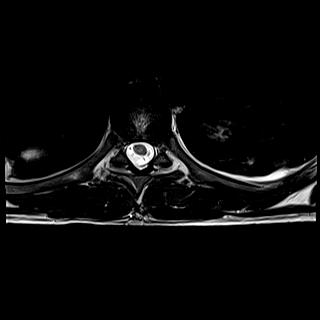
[im 22/39]
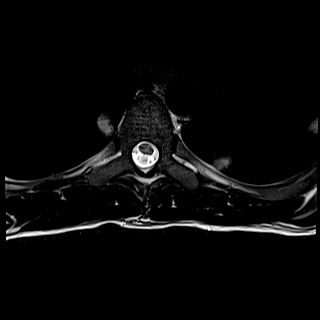
[im 28/39]
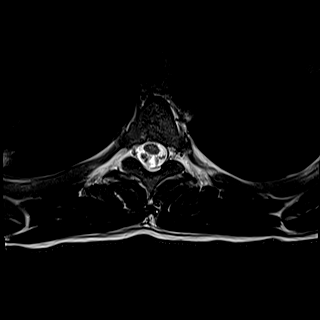
[im 33/39]
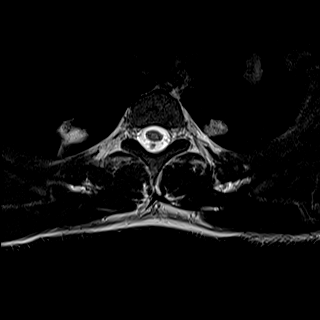
[im 39/39]
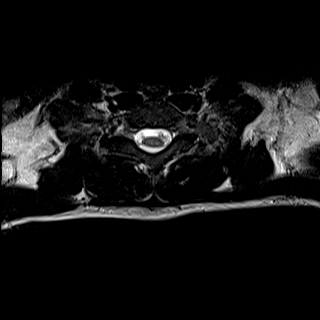

[Series 23: T1 · axial · non-contrast · 4.0mm · 0.31mm/px · z∈[-165,+98]mm · 8 of 39 slices shown (3 of 3)]
[im 1/39]
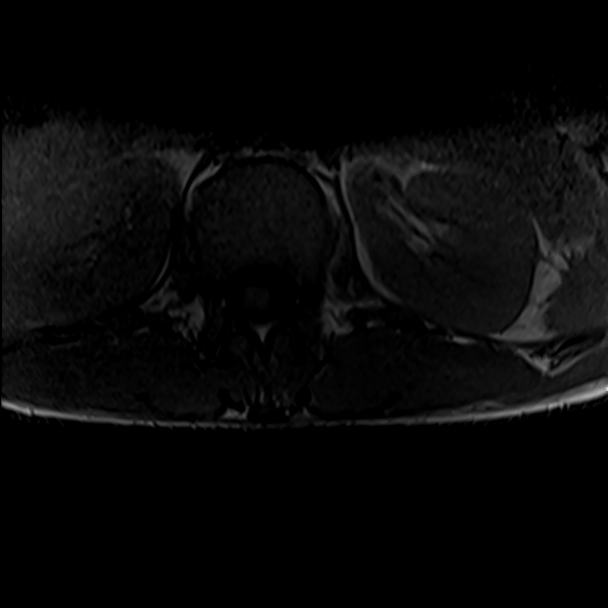
[im 6/39]
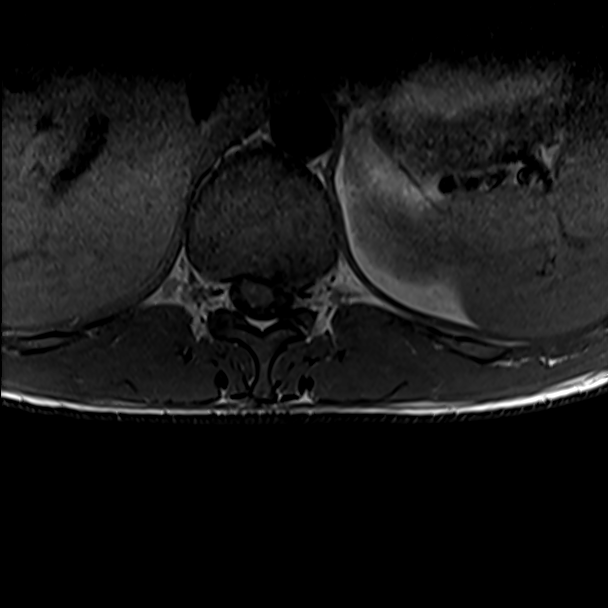
[im 11/39]
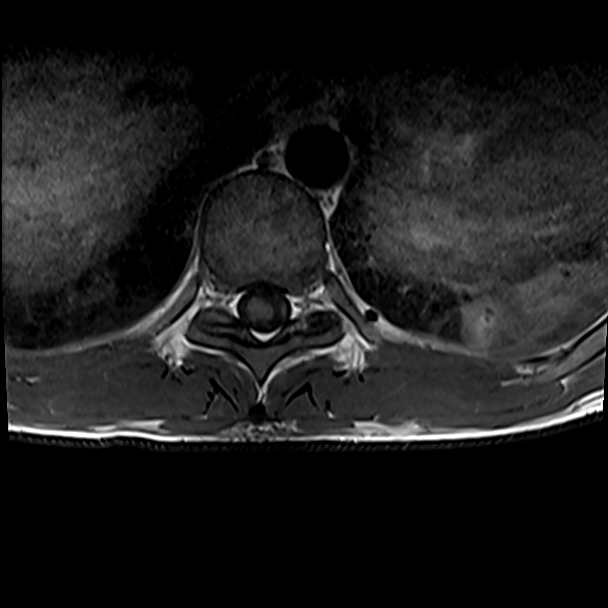
[im 17/39]
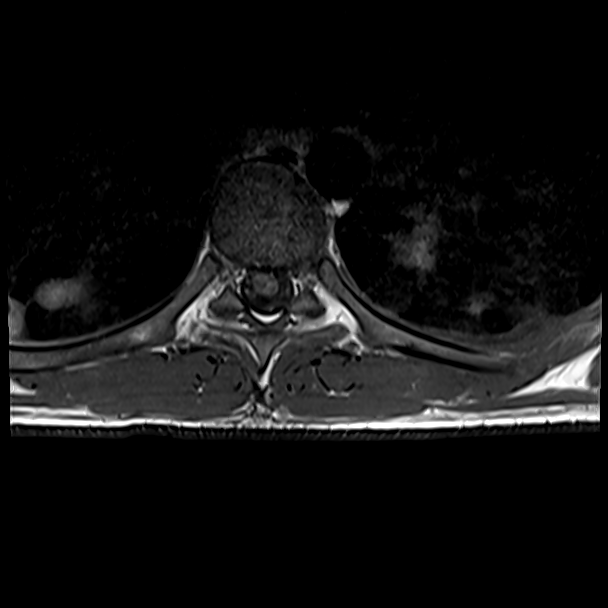
[im 22/39]
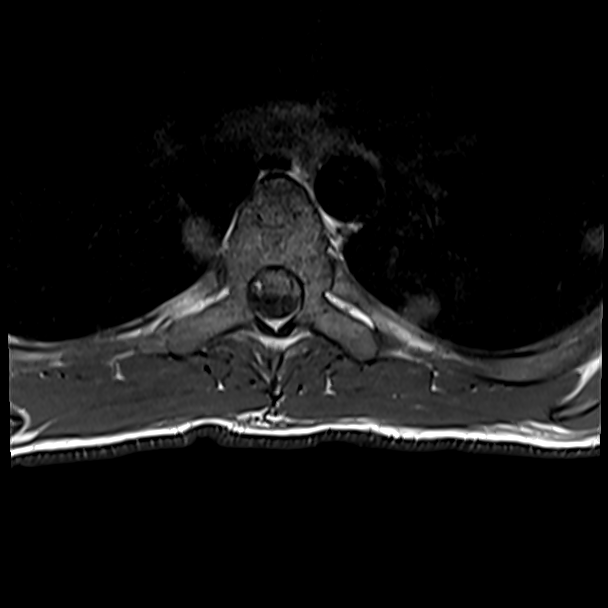
[im 28/39]
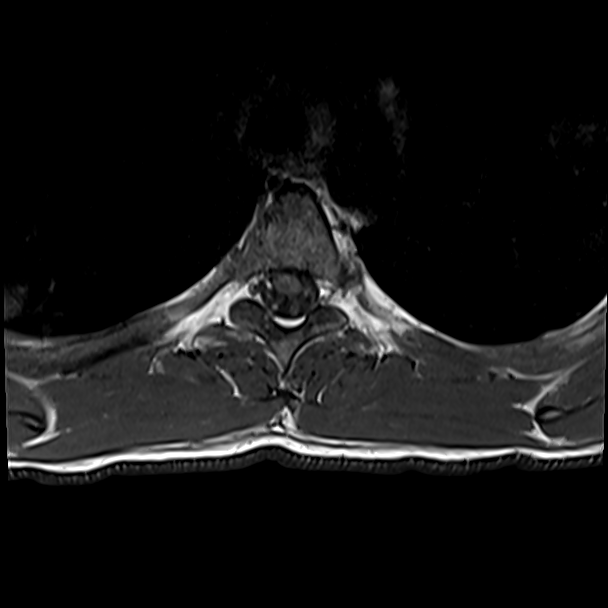
[im 33/39]
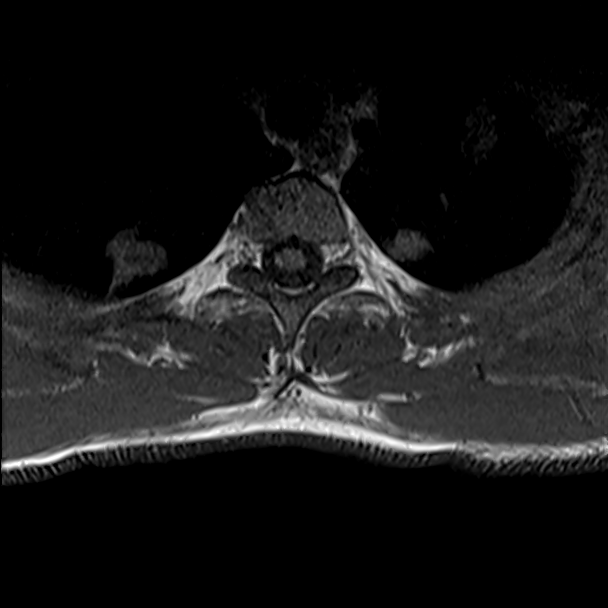
[im 39/39]
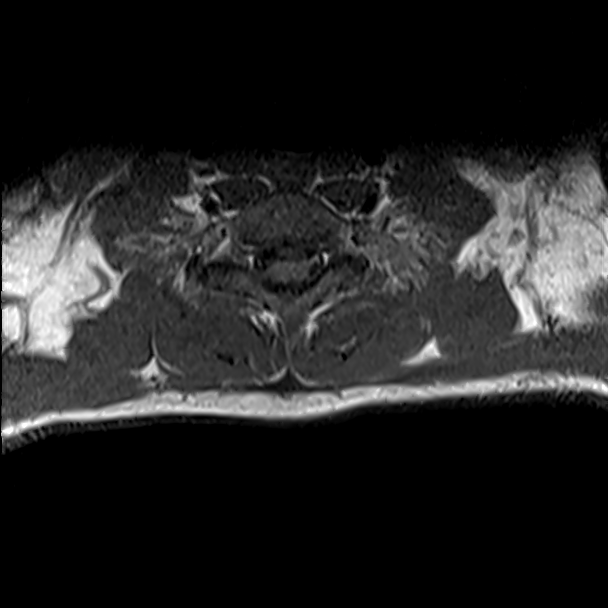

[24 of 48 positions shown; findings below may reference images not displayed]

FINDINGS: Alignment:  Normal

Vertebrae: Negative for fracture or mass. No evidence of spinal
infection. There is mild edema at T12-L1 which is likely
degenerative due to Schmorl's node anteriorly

Cord:  Normal signal and morphology.  No cord compression.

Paraspinal and other soft tissues: Numerous pulmonary nodules
bilaterally. These may be due to septic emboli infection given the
history.

Disc levels:

Negative for thoracic spinal stenosis. Small right-sided disc
protrusion at T8-9. Mild disc degeneration and spurring T11-12 and
T12-L1.
IMPRESSION: Negative for thoracic spinal infection

Mild thoracic disc degeneration without spinal stenosis

Numerous pulmonary nodules bilaterally which may be due to septic
emboli given history.

## 2021-04-10 MED ORDER — LIDOCAINE 5 % EX PTCH
1.0000 | MEDICATED_PATCH | CUTANEOUS | Status: DC
  Administered 2021-04-10 – 2021-04-25 (×16): 1 via TRANSDERMAL
  Filled 2021-04-10 (×16): qty 1

## 2021-04-10 MED ORDER — GADOBUTROL 1 MMOL/ML IV SOLN
7.0000 mL | Freq: Once | INTRAVENOUS | Status: AC | PRN
  Administered 2021-04-10: 7 mL via INTRAVENOUS
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
## 2021-04-10 NOTE — Progress Notes (Signed)
Pharmacy Antibiotic Note  Duane Price is a 35 y.o. male admitted on 03/25/2021 with MRSA endocarditis w/ TV vegetation, septic emboli.  Pharmacy has been consulted for vancomycin.  VT 7 - previously was 7 on 7/12 as well but repeat trough on 7/13 was 16 on same dose.  VP 50 - peak drawn either at end of infusion or immediately after infusion which did not allow for redistribution, will re-attempt for tomorrow.   Plan: Continue Vancomycin 1250mg  IV Q12 Recheck vanc levels on 7/18 F/u BMP, renal fx, clinical status.  F/u abx end date per ID.   Height: 6' (182.9 cm) Weight: 70.9 kg (156 lb 4.8 oz) IBW/kg (Calculated) : 77.6  Temp (24hrs), Avg:98.4 F (36.9 C), Min:98.4 F (36.9 C), Max:98.4 F (36.9 C)  Recent Labs  Lab 04/04/21 0155 04/04/21 0618 04/05/21 0903 04/06/21 0058 04/06/21 0943 04/07/21 0214 04/08/21 0224 04/10/21 0753 04/10/21 0820 04/10/21 1012  WBC  --  24.8*  --   --   --  12.5* 12.1*  --  10.0  --   CREATININE 0.53*  --   --   --   --  0.66 0.69  --   --   --   VANCOTROUGH  --   --    < >  --  16  --   --  7*  --   --   VANCOPEAK  --   --   --  17*  --   --   --   --   --  50*   < > = values in this interval not displayed.    Estimated Creatinine Clearance: 132.9 mL/min (by C-G formula based on SCr of 0.69 mg/dL).    No Known Allergies  Antimicrobials this admission: Cefepime 7/1 >> 7/2 Vancomycin 7/1 >>   Vanc levels:  7/4 VP 32/VT 10 - AUC 530 (at goal) - no change 7/12 VT 7 - no change 7/13 VP 17/VT 16, AUC 403, continue 1250q12 7/17 VT 7/VP 50 (both suspected drawn incorrectly), continue 1250q12  Microbiology results: 7/7 Bcx: ngtd 7/5 Tissue veg cx: MRSA 7/5 Bcx: 1/4 MRSA 7/1 BCx: 3/3 staph aureus, MecA+, MREJ+ 7/1 UA mod Hgb, + nitrite, specific gravity >1 7/1 HCV ab reactive, HIV neg, hep A/B neg  9/1, PharmD PGY1 Resident 04/10/2021 11:25 AM   Please check AMION for all [facility] phone numbers After 10:00 PM, call Main  Pharmacy [phone]

## 2021-04-11 ENCOUNTER — Ambulatory Visit: Payer: Self-pay

## 2021-04-11 LAB — CBC WITH DIFFERENTIAL/PLATELET
Abs Immature Granulocytes: 0.09 10*3/uL — ABNORMAL HIGH (ref 0.00–0.07)
Basophils Absolute: 0 10*3/uL (ref 0.0–0.1)
Basophils Relative: 0 %
Eosinophils Absolute: 0.2 10*3/uL (ref 0.0–0.5)
Eosinophils Relative: 2 %
HCT: 26.6 % — ABNORMAL LOW (ref 39.0–52.0)
Hemoglobin: 8.7 g/dL — ABNORMAL LOW (ref 13.0–17.0)
Immature Granulocytes: 1 %
Lymphocytes Relative: 20 %
Lymphs Abs: 2.3 10*3/uL (ref 0.7–4.0)
MCH: 28.8 pg (ref 26.0–34.0)
MCHC: 32.7 g/dL (ref 30.0–36.0)
MCV: 88.1 fL (ref 80.0–100.0)
Monocytes Absolute: 0.6 10*3/uL (ref 0.1–1.0)
Monocytes Relative: 5 %
Neutro Abs: 8.5 10*3/uL — ABNORMAL HIGH (ref 1.7–7.7)
Neutrophils Relative %: 72 %
Platelets: 514 10*3/uL — ABNORMAL HIGH (ref 150–400)
RBC: 3.02 MIL/uL — ABNORMAL LOW (ref 4.22–5.81)
RDW: 15.3 % (ref 11.5–15.5)
WBC: 11.7 10*3/uL — ABNORMAL HIGH (ref 4.0–10.5)
nRBC: 0 % (ref 0.0–0.2)

## 2021-04-11 LAB — COMPREHENSIVE METABOLIC PANEL
ALT: 18 U/L (ref 0–44)
AST: 13 U/L — ABNORMAL LOW (ref 15–41)
Albumin: 2 g/dL — ABNORMAL LOW (ref 3.5–5.0)
Alkaline Phosphatase: 93 U/L (ref 38–126)
Anion gap: 6 (ref 5–15)
BUN: 17 mg/dL (ref 6–20)
CO2: 27 mmol/L (ref 22–32)
Calcium: 8.4 mg/dL — ABNORMAL LOW (ref 8.9–10.3)
Chloride: 104 mmol/L (ref 98–111)
Creatinine, Ser: 0.65 mg/dL (ref 0.61–1.24)
GFR, Estimated: >60 mL/min (ref >60)
Glucose, Bld: 120 mg/dL — ABNORMAL HIGH (ref 70–99)
Potassium: 4.1 mmol/L (ref 3.5–5.1)
Sodium: 137 mmol/L (ref 135–145)
Total Bilirubin: 0.5 mg/dL (ref 0.3–1.2)
Total Protein: 7.1 g/dL (ref 6.5–8.1)

## 2021-04-11 LAB — PHOSPHORUS: Phosphorus: 3.8 mg/dL (ref 2.5–4.6)

## 2021-04-11 LAB — C-REACTIVE PROTEIN: CRP: 2.2 mg/dL — ABNORMAL HIGH (ref <1.0)

## 2021-04-11 LAB — MAGNESIUM: Magnesium: 1.8 mg/dL (ref 1.7–2.4)

## 2021-04-11 LAB — VANCOMYCIN, PEAK: Vancomycin Pk: 23 ug/mL — ABNORMAL LOW (ref 30–40)

## 2021-04-11 LAB — TSH: TSH: 2.159 u[IU]/mL (ref 0.350–4.500)

## 2021-04-11 MED ORDER — ENOXAPARIN SODIUM 40 MG/0.4ML IJ SOSY
40.0000 mg | PREFILLED_SYRINGE | INTRAMUSCULAR | Status: DC
  Administered 2021-04-11 – 2021-04-12 (×2): 40 mg via SUBCUTANEOUS
  Filled 2021-04-11 (×2): qty 0.4
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
## 2021-04-11 NOTE — Progress Notes (Signed)
[facility]  Date of Admission:  03/25/2021     CC: Mrsa bacteremia  Lines: Peirpheral iv's  Abx: 7/01-c vanc  ASSESSMENT: 35 yo male ivdu admitted 7/01 for 1 week chest pain found to have severe sepsis in setting mrsa bacteremia along with septic pulm emboli and echo finding of tv endocarditis  Patient is s/p angiovac on 7/05 for persistent bactermia on 7/1, 7/2, and 7/05 Bcx cleared on 7/07 Clinically doing much better  Has thoracic pain but negative mri 7/17  Planned 6 weeks abx. Reasonable to transition the last 2 weeks to po linezolid   Prior hep C infection, cleared.   PLAN: Continue vancomycin for now; 4 weeks duration planned from 7/05 until 8/02, then can finish the last 2 weeks with PO linezolid 600 mg bid Discussed with primary team/id pharmacy Please reengage ID around beginning of august so we can reassess prior to discharge Please repeat tte at the end of abx therapy ID will sign off  I spent more than 35 minute reviewing data/chart, and coordinating care and >50% direct face to face time providing counseling/discussing diagnostics/treatment plan with patient   Principal Problem:   MRSA bacteremia Active Problems:   Septic embolism (HCC)   Normocytic anemia   IV drug abuse (HCC)   Endocarditis of tricuspid valve   HCV antibody positive   No Known Allergies  Scheduled Meds:  enoxaparin (LOVENOX) injection  40 mg Subcutaneous Q24H   feeding supplement  237 mL Oral BID BM   lidocaine  1 patch Transdermal Q24H   nicotine  21 mg Transdermal Daily   pantoprazole  40 mg Oral Daily   Continuous Infusions:  sodium chloride Stopped (04/04/21 0127)   vancomycin 1,250 mg (04/11/21 0807)   PRN Meds:.sodium chloride, [COMPLETED] acetaminophen **FOLLOWED BY** acetaminophen, HYDROmorphone, hydrOXYzine, loperamide, LORazepam, nicotine polacrilex, nitroGLYCERIN, traZODone   SUBJECTIVE: Doing well Mild chest pain still  but no dyspnea/cough/sob No back pain No n/v/diarrhea No rash No other joint pain  Review of Systems: ROS All other ROS was negative, except mentioned above     OBJECTIVE: Vitals:   04/10/21 1331 04/10/21 2026 04/11/21 0445 04/11/21 1407  BP: 138/79 125/78 123/79 116/71  Pulse: 97 (!) 110 (!) 104 (!) 105  Resp:  15 19 18   Temp: 98.4 F (36.9 C) 98.4 F (36.9 C) 98.4 F (36.9 C) 97.9 F (36.6 C)  TempSrc: Oral Oral Oral Oral  SpO2:  98% 99% 98%  Weight:      Height:       Body mass index is 21.2 kg/m.  Physical Exam General/constitutional: no distress, pleasant HEENT: Normocephalic, PER, Conj Clear, EOMI, Oropharynx clear Neck supple CV: rrr no mrg Lungs: clear to auscultation, normal respiratory effort Abd: Soft, Nontender Ext: no edema Skin: No Rash Neuro: nonfocal MSK: no peripheral joint swelling/tenderness/warmth; back spines nontender    Lab Results Lab Results  Component Value Date   WBC 11.7 (H) 04/11/2021   HGB 8.7 (L) 04/11/2021   HCT 26.6 (L) 04/11/2021   MCV 88.1 04/11/2021   PLT 514 (H) 04/11/2021    Lab Results  Component Value Date   CREATININE 0.65 04/11/2021   BUN 17 04/11/2021   NA 137 04/11/2021   K 4.1 04/11/2021   CL 104 04/11/2021   CO2 27 04/11/2021    Lab Results  Component Value Date   ALT 18 04/11/2021   AST 13 (L) 04/11/2021   ALKPHOS 93  04/11/2021   BILITOT 0.5 04/11/2021      Microbiology: No results found for this or any previous visit (from the past 240 hour(s)).   Serology: Gc/chlam, rpr, hepatitis a serology, hep c RNA quant negative Hep b sag, core ab negative Hep b sAb >1000 Hiv screen negative Hep c Ab positive  Imaging: If present, new imagings (plain films, ct scans, and mri) have been personally visualized and interpreted; radiology reports have been reviewed. Decision making incorporated into the Impression / Recommendations.  7/01 chest cta No definite evidence of pulmonary embolus.    Multifocal patchy airspace opacities are noted bilaterally, some of which are cavitary, consistent with septic emboli   7/02 tte  1. Large (1.9 x 1.5 cm) oscillating mass on TV consistent with  vegetation. Findings discussed with Cipriano Bunker MD.   2. Left ventricular ejection fraction, by estimation, is 60 to 65%. The  left ventricle has normal function. The left ventricle has no regional  wall motion abnormalities. Left ventricular diastolic parameters were  normal.   3. Right ventricular systolic function is normal. The right ventricular  size is normal. There is normal pulmonary artery systolic pressure.   4. The mitral valve is normal in structure. Trivial mitral valve  regurgitation. No evidence of mitral stenosis.   5. The aortic valve is tricuspid. Aortic valve regurgitation is not  visualized. No aortic stenosis is present.   6. The inferior vena cava is normal in size with greater than 50%  respiratory variability, suggesting right atrial pressure of 3 mmHg.    7/17 mr thoracic spine Negative for thoracic spinal infection   Mild thoracic disc degeneration without spinal stenosis   Numerous pulmonary nodules bilaterally which may be due to septic emboli given history.   Raymondo Band, MD [facility] [phone] pager    04/11/2021, 3:34 PM

## 2021-04-12 ENCOUNTER — Inpatient Hospital Stay (HOSPITAL_COMMUNITY): Payer: Self-pay

## 2021-04-12 LAB — VANCOMYCIN, TROUGH: Vancomycin Tr: 8 ug/mL — ABNORMAL LOW (ref 15–20)

## 2021-04-12 LAB — VANCOMYCIN, PEAK: Vancomycin Pk: 28 ug/mL — ABNORMAL LOW (ref 30–40)

## 2021-04-12 LAB — CREATININE, SERUM
Creatinine, Ser: 0.64 mg/dL (ref 0.61–1.24)
GFR, Estimated: >60 mL/min (ref >60)

## 2021-04-12 IMAGING — CT CT ANGIO CHEST
2 of 6 series · 18 of 36 positions shown · IV contrast (omnipaque)
Comparison: [DATE]

CLINICAL DATA: Chest pain, shortness of breath

EXAM:
CT ANGIOGRAPHY CHEST WITH CONTRAST
TECHNIQUE: Multidetector CT imaging of the chest was performed using the
standard protocol during bolus administration of intravenous
contrast. Multiplanar CT image reconstructions and MIPs were
obtained to evaluate the vascular anatomy.
CONTRAST:  60mL OMNIPAQUE IOHEXOL 350 MG/ML SOLN

[Series 6: pe thins · axial · 0.71mm/px · z∈[+1278,+1524]mm · 17 of 387 slices shown]
[im 20/387  lung]
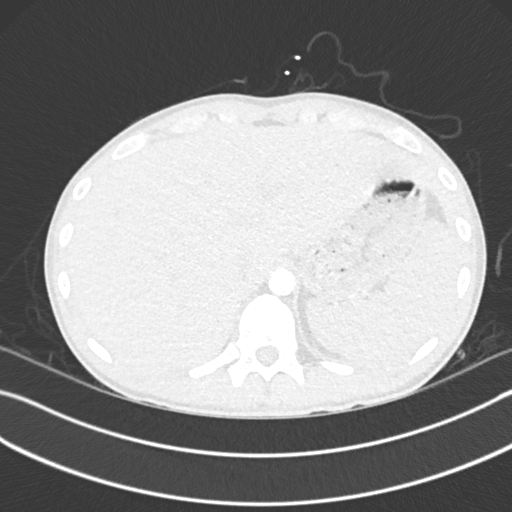
[im 39/387  mediastinal]
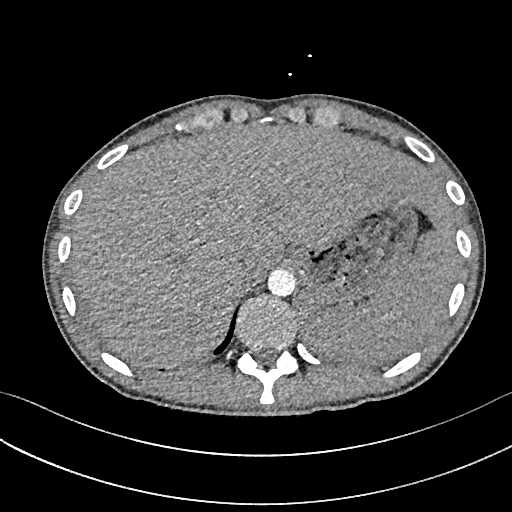
[im 58/387  lung]
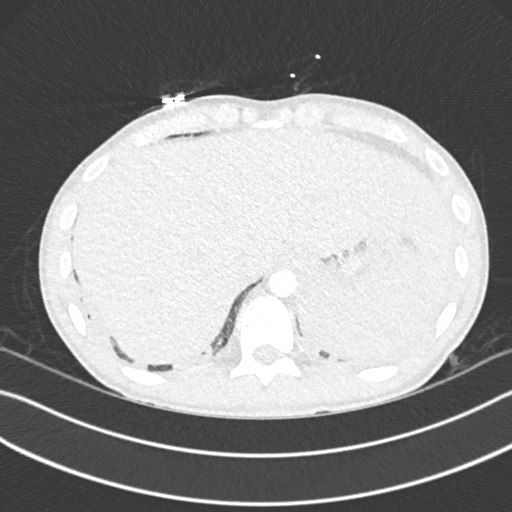
[im 78/387  mediastinal]
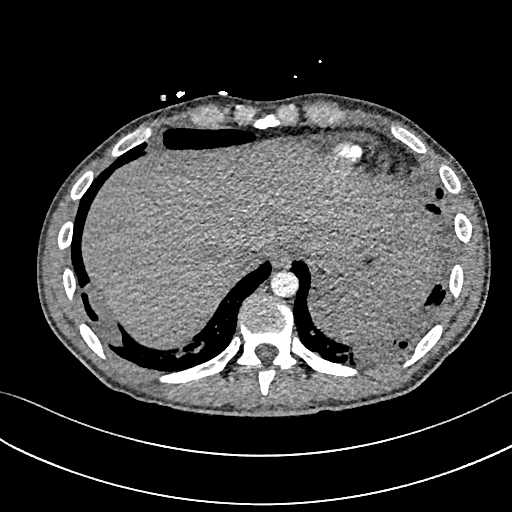
[im 116/387  lung]
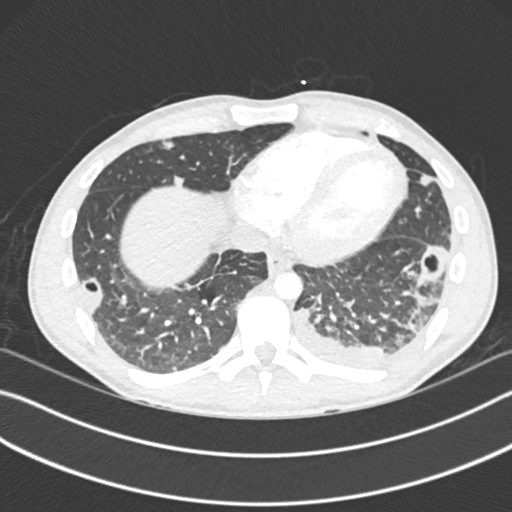
[im 136/387  mediastinal]
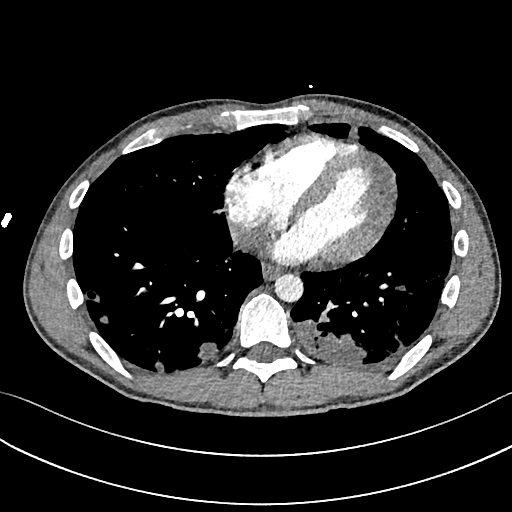
[im 155/387  lung]
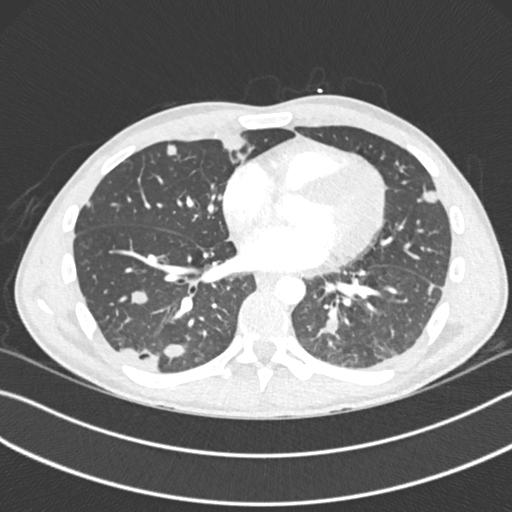
[im 174/387  mediastinal]
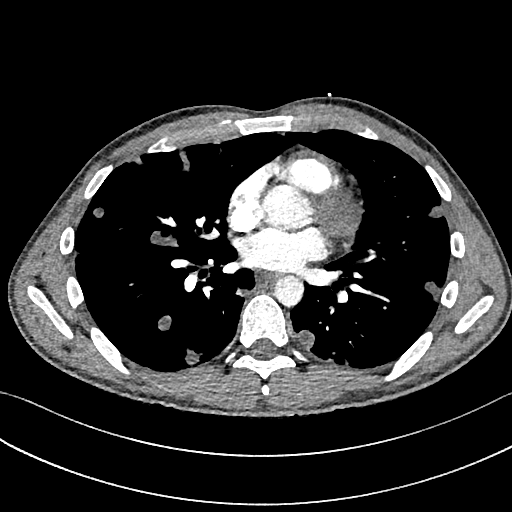
[im 194/387  lung]
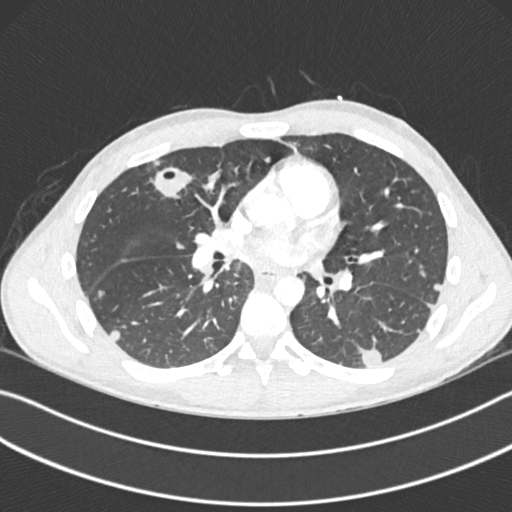
[im 213/387  mediastinal]
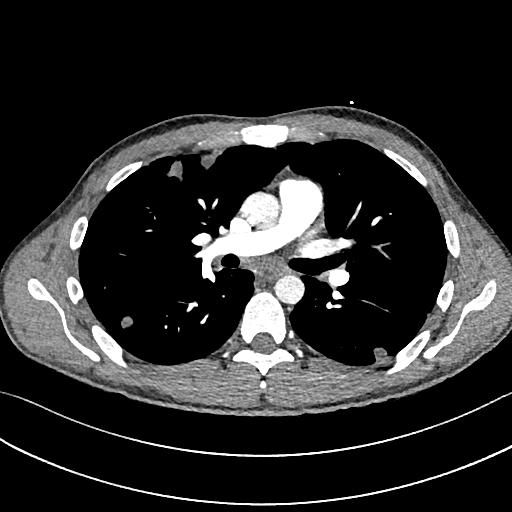
[im 232/387  lung]
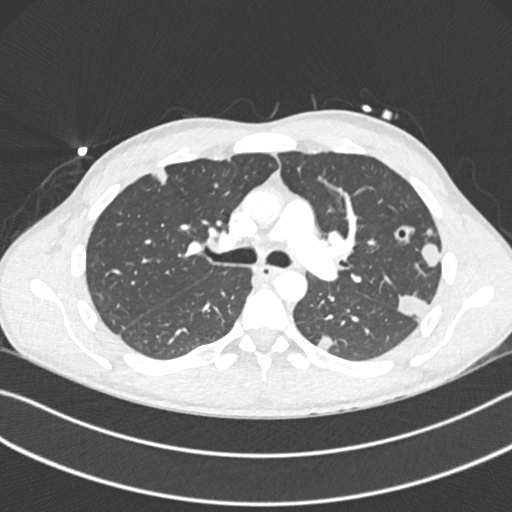
[im 251/387  mediastinal]
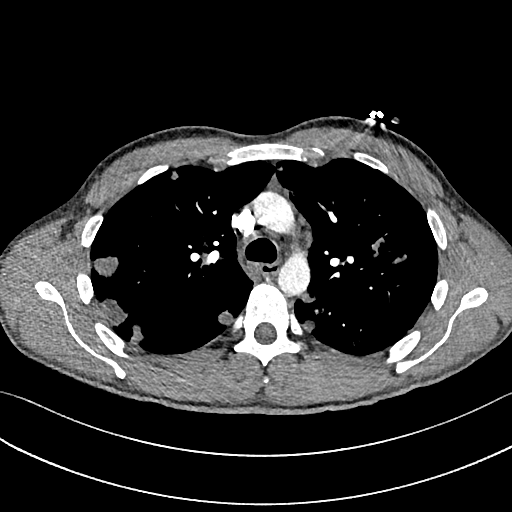
[im 271/387  lung]
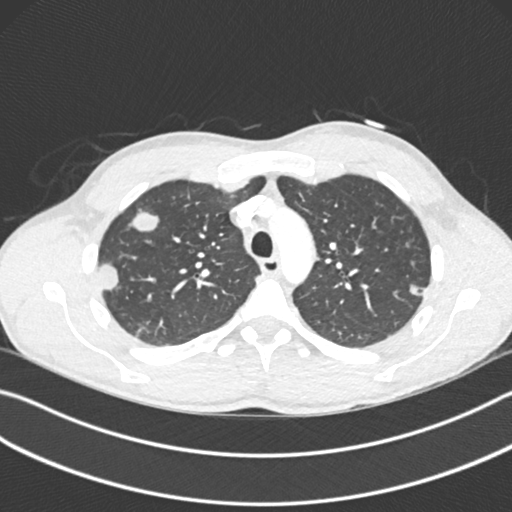
[im 309/387  mediastinal]
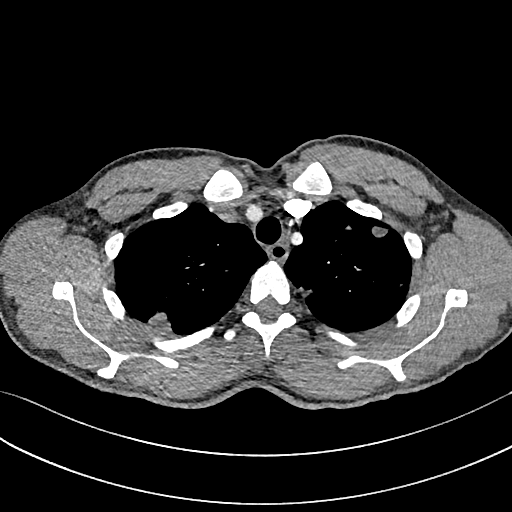
[im 329/387  lung]
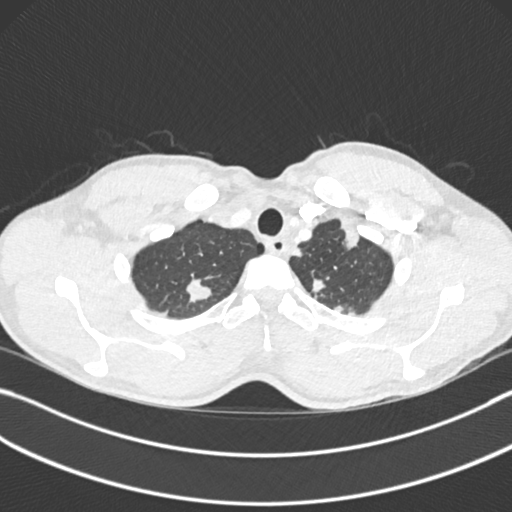
[im 348/387  mediastinal]
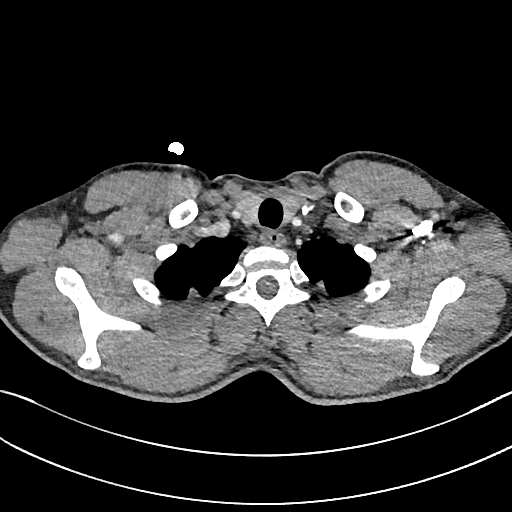
[im 367/387  lung]
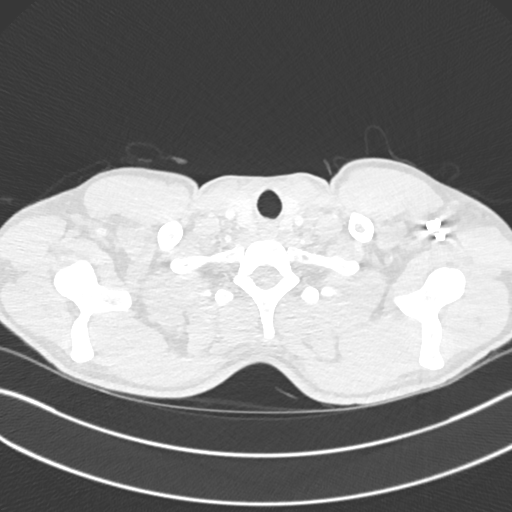

[Series 8: pe 2mm cor · coronal · 0.54mm/px · 1 of 117 slices shown]
[im 59/117  mediastinal]
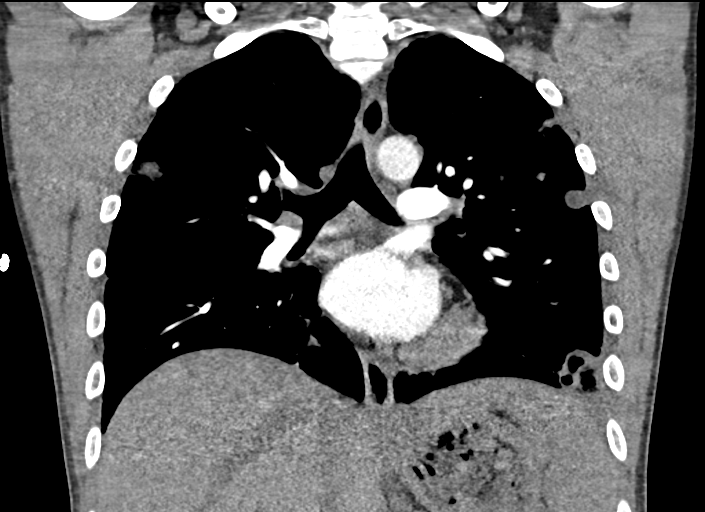

[18 of 36 positions shown; findings below may reference images not displayed]

FINDINGS: Cardiovascular: Heart size normal. Trace pericardial fluid. The RV
is nondilated. There is good contrast opacification of the pulmonary
arterial tree. Single solitary segmental occlusion in the medial
basal segment left lower lobe pulmonary artery branch, new since
previous. Adequate contrast opacification of the thoracic aorta with
no evidence of dissection, aneurysm, or stenosis. There is classic
3-vessel brachiocephalic arch anatomy without proximal stenosis. No
significant atheromatous change.

Mediastinum/Nodes: Subcentimeter prevascular and AP window lymph
nodes. No hilar adenopathy. No mediastinal mass or hemorrhage.

Lungs/Pleura: Multiple predominantly peripheral nodular opacities,
some cavitary. Overall increase in disease load compared to prior
study, although some discrete lesions have clearly enlarged e.g.
posterior right upper lobe image [DATE], others improved e.g. lateral
left upper lobe image 35/7. There is new confluent airspace
consolidation in the left lower lobe superior and posterior basal
segments. No pneumothorax. No pleural effusion.

Upper Abdomen: No acute findings.

Musculoskeletal: No chest wall abnormality. No acute or significant
osseous findings.

Review of the MIP images confirms the above findings.
IMPRESSION: 1. Interval progression of bilateral septic pulmonary emboli.
2. Single posterior left lower lobe branch segmental pulmonary
embolus.

## 2021-04-12 MED ORDER — IOHEXOL 350 MG/ML SOLN
60.0000 mL | Freq: Once | INTRAVENOUS | Status: AC | PRN
  Administered 2021-04-12: 60 mL via INTRAVENOUS

## 2021-04-12 MED ORDER — OXYCODONE HCL 5 MG PO TABS: 5.0000 mg | ORAL_TABLET | ORAL | Status: DC | PRN

## 2021-04-12 MED ORDER — OXYCODONE HCL 5 MG PO TABS
10.0000 mg | ORAL_TABLET | ORAL | Status: DC | PRN
  Administered 2021-04-12 – 2021-04-14 (×8): 10 mg via ORAL
  Filled 2021-04-12 (×8): qty 2

## 2021-04-12 MED ORDER — IBUPROFEN 200 MG PO TABS
400.0000 mg | ORAL_TABLET | ORAL | Status: DC | PRN
  Administered 2021-04-12 – 2021-04-20 (×14): 400 mg via ORAL
  Filled 2021-04-12 (×14): qty 2

## 2021-04-12 MED ORDER — ENOXAPARIN SODIUM 80 MG/0.8ML IJ SOSY
70.0000 mg | PREFILLED_SYRINGE | Freq: Two times a day (BID) | INTRAMUSCULAR | Status: DC
  Administered 2021-04-12 – 2021-04-22 (×20): 70 mg via SUBCUTANEOUS
  Filled 2021-04-12 (×20): qty 0.8

## 2021-04-12 NOTE — Progress Notes (Signed)
Pharmacy Antibiotic Note  Duane Price is a 35 y.o. male admitted on 03/25/2021 with MRSA endocarditis w/ TV vegetation, septic emboli.  Pharmacy has been consulted for vancomycin.  Vanc peak 23     7/18 1157 Vanc trough 8     7/18 2029  Initially it was thought that this trough was drawn after RN hung the dose but that is not the case - it just took a long time to result Vanc evening dose given on 7/18 2050  Calculated AUC 420 (within goal of 400-550)  Plan: Continue Vancomycin 1250mg  IV Q12 F/u BMP, renal fxn, clinical status.  F/u abx end date per ID.   Height: 6' (182.9 cm) Weight: 70.9 kg (156 lb 4.8 oz) IBW/kg (Calculated) : 77.6  Temp (24hrs), Avg:98.2 F (36.8 C), Min:97.9 F (36.6 C), Max:98.4 F (36.9 C)  Recent Labs  Lab 04/07/21 0214 04/08/21 0224 04/10/21 0753 04/10/21 0820 04/10/21 1012 04/11/21 0557 04/11/21 1157 04/11/21 2029 04/11/21 2329  WBC 12.5* 12.1*  --  10.0  --  11.7*  --   --   --   CREATININE 0.66 0.69  --   --   --  0.65  --   --   --   VANCOTROUGH  --   --  7*  --   --   --   --  8*  --   VANCOPEAK  --   --   --   --    < >  --  23*  --  28*   < > = values in this interval not displayed.     Estimated Creatinine Clearance: 132.9 mL/min (by C-G formula based on SCr of 0.65 mg/dL).    No Known Allergies  Antimicrobials this admission: Cefepime 7/1 >> 7/2 Vancomycin 7/1 >>   Vanc levels:  7/4 VP 32/VT 10 - AUC 530 (at goal) - no change 7/12 VT 7 - no change 7/13 VP 17/VT 16, AUC 403, continue 1250q12 7/17 VT 7/VP 50 (both suspected drawn incorrectly), continue 1250q12 7/18 VP 23/VT 8, AUC 420, continue 1250mg  q12h  Microbiology results: 7/7 Bcx: ngtd 7/5 Tissue veg cx: MRSA 7/5 Bcx: 1/4 MRSA 7/1 BCx: 3/3 staph aureus, MecA+, MREJ+ 7/1 UA mod Hgb, + nitrite, specific gravity >1 7/1 HCV ab reactive, HIV neg, hep A/B neg  9/1, PharmD, BCPS Please see amion for complete clinical pharmacist phone list 04/12/2021 1:22 AM    Please check AMION for all [facility] Pharmacy phone numbers After 10:00 PM, call Main Pharmacy [phone]

## 2021-04-12 NOTE — Plan of Care (Signed)

## 2021-04-12 NOTE — Progress Notes (Signed)
ANTICOAGULATION CONSULT NOTE - Initial Consult  Pharmacy Consult for enoxaparin Indication: pulmonary embolus 7/19   No Known Allergies  Patient Measurements: Height: 6' (182.9 cm) Weight: 67.4 kg (148 lb 8 oz) IBW/kg (Calculated) : 77.6    Vital Signs: Temp: 98.1 F (36.7 C) (07/19 0641) Temp Source: Axillary (07/19 0641) BP: 120/73 (07/19 0641) Pulse Rate: 112 (07/19 0641)  Labs: Recent Labs    04/10/21 0820 04/11/21 0557 04/12/21 0610  HGB 7.8* 8.7*  --   HCT 23.9* 26.6*  --   PLT 464* 514*  --   CREATININE  --  0.65 0.64    Estimated Creatinine Clearance: 126.4 mL/min (by C-G formula based on SCr of 0.64 mg/dL).    Assessment: 35 yo M with interval progression of bilateral septic pulmonary emboli 7/19 while on prophylactic enoxaparin in the setting of MRSA endocarditis. Pharmacy consulted for enoxaparin.   Good renal function. Last dose enoxaparin 40mg  at 12:30  Goal of Therapy:  Monitor platelets by anticoagulation protocol: Yes   Plan:  Start enoxaparin 70mg  Q12 hr Monitor for signs/symptoms of bleeding  F/u long term plan  , PharmD, BCPS, BCCP Clinical Pharmacist  Please check AMION for all [facility] Pharmacy phone numbers After 10:00 PM, call Main Pharmacy [phone]
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
## 2021-04-13 ENCOUNTER — Inpatient Hospital Stay (HOSPITAL_COMMUNITY): Payer: Self-pay

## 2021-04-13 DIAGNOSIS — I2602 Saddle embolus of pulmonary artery with acute cor pulmonale: Secondary | ICD-10-CM

## 2021-04-13 LAB — ECHOCARDIOGRAM LIMITED
Height: 72 in
S' Lateral: 2.3 cm
Weight: 2376 oz

## 2021-04-13 IMAGING — CT CT HEAD W/O CM
3 series · 18 of 37 positions shown, 20 images · non-contrast
Comparison: [DATE]

CLINICAL DATA: Syncope

EXAM:
CT HEAD WITHOUT CONTRAST
TECHNIQUE: Contiguous axial images were obtained from the base of the skull
through the vertex without intravenous contrast.

[Series 3: head bone · axial · 0.42mm/px · z∈[-270,-144]mm · 8 of 79 slices shown]
[im 8/79  bone]
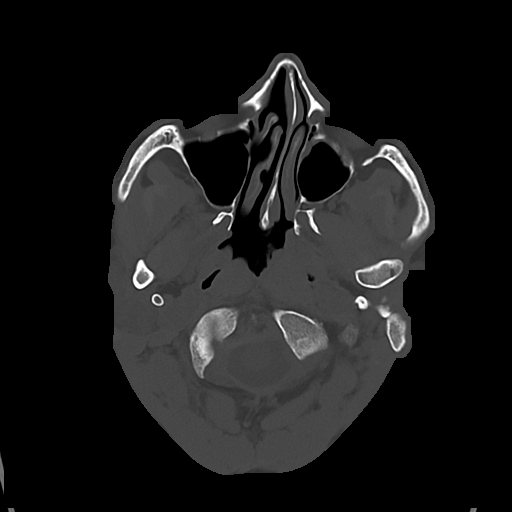
[im 16/79  bone]
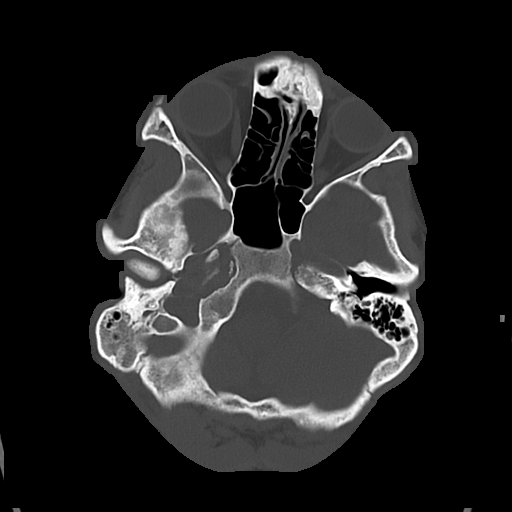
[im 24/79  bone]
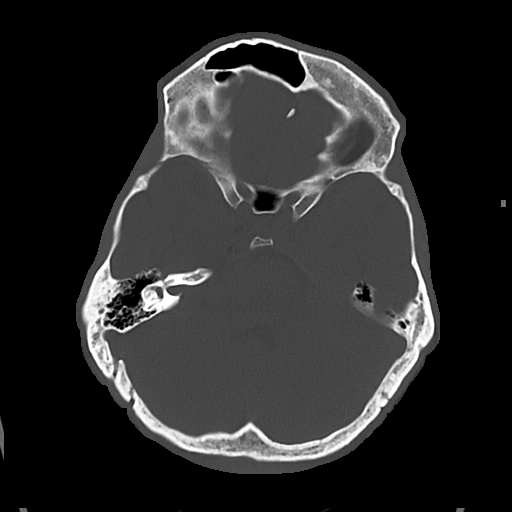
[im 36/79  bone]
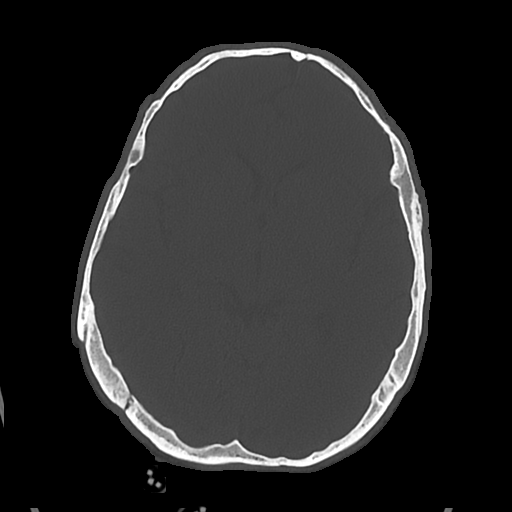
[im 43/79  bone]
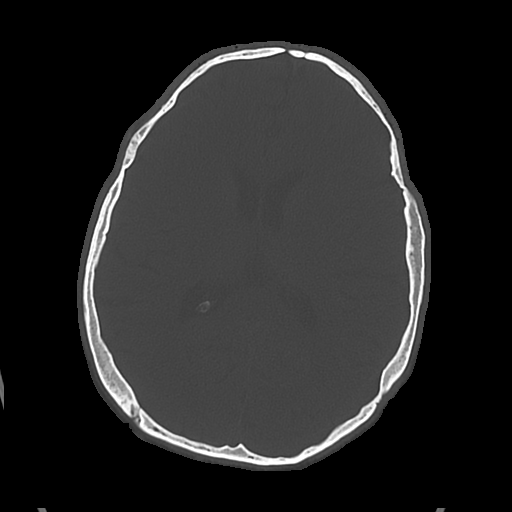
[im 55/79  bone]
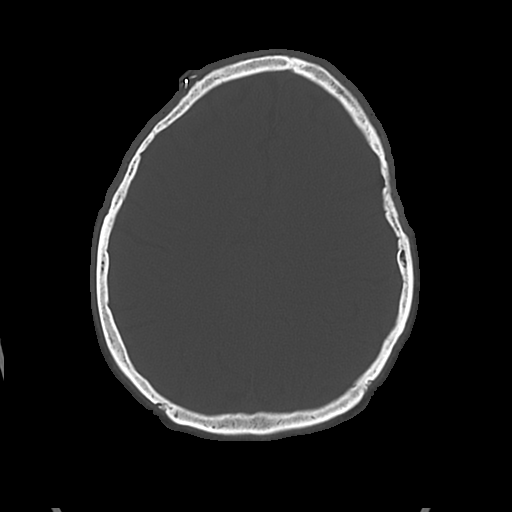
[im 63/79  bone]
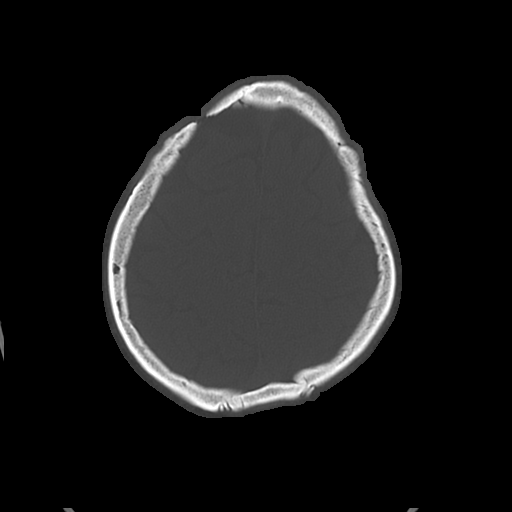
[im 71/79  bone]
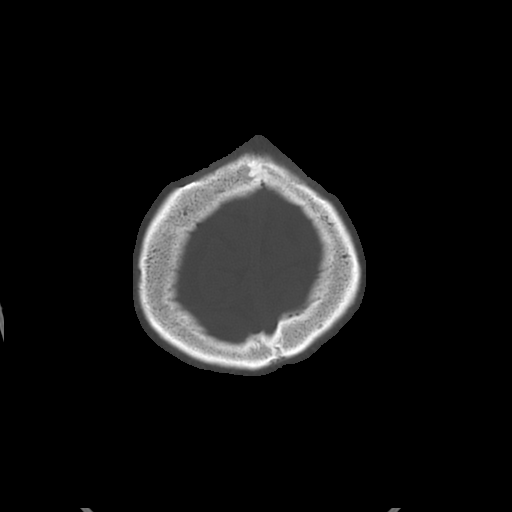

[Series 4: head wo · axial · 0.42mm/px · z∈[-268,-148]mm · 7 of 32 slices shown, 9 images]
[im 4/32  brain]
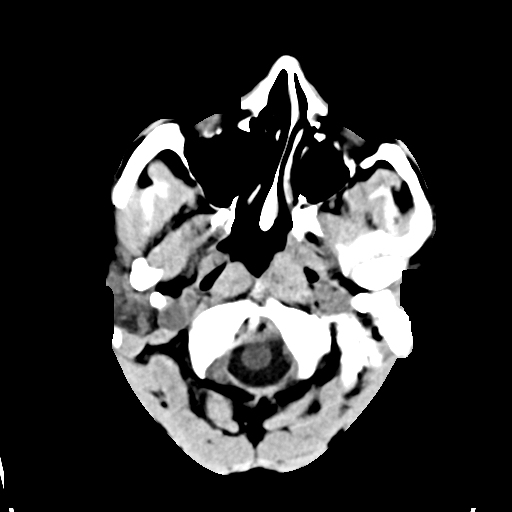
[im 4/32  bone]
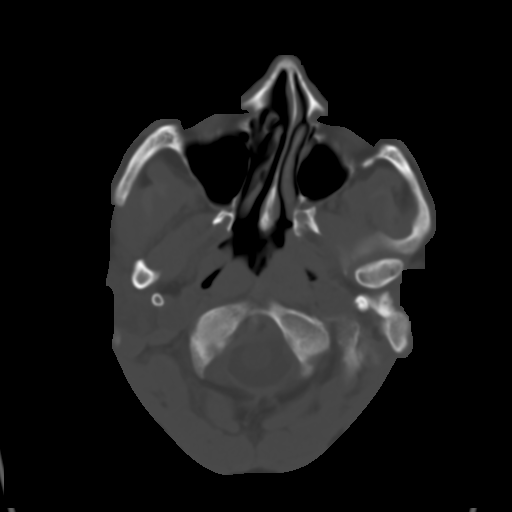
[im 8/32  brain]
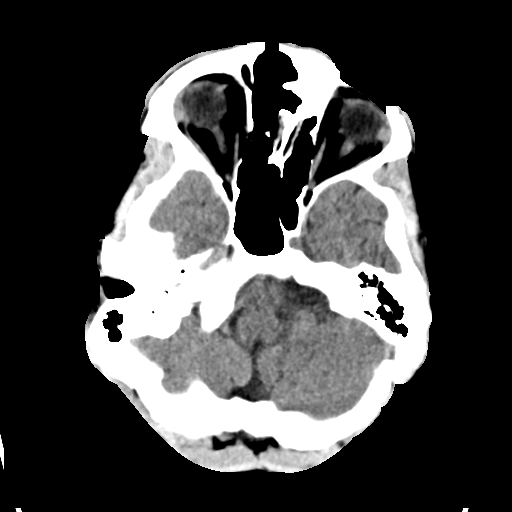
[im 12/32  brain]
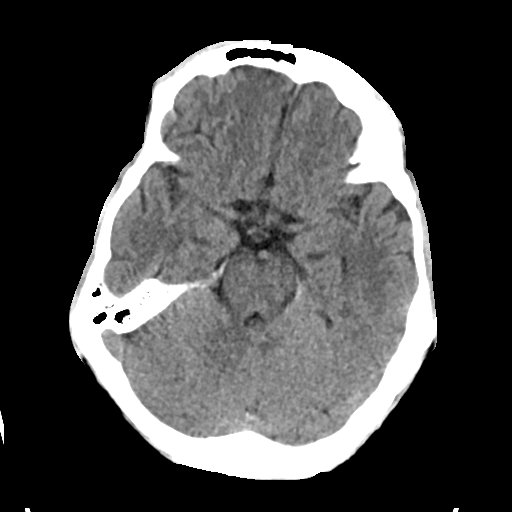
[im 16/32  brain]
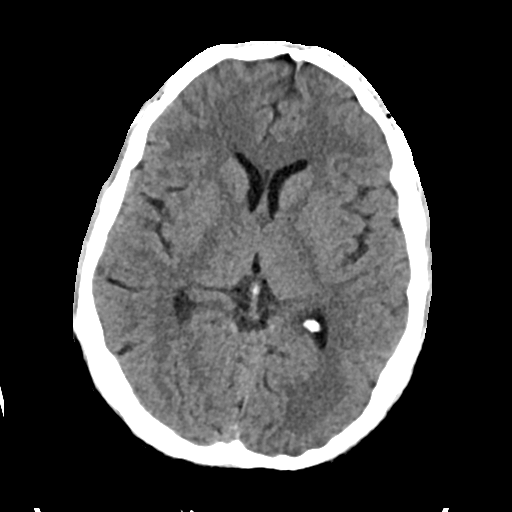
[im 20/32  brain]
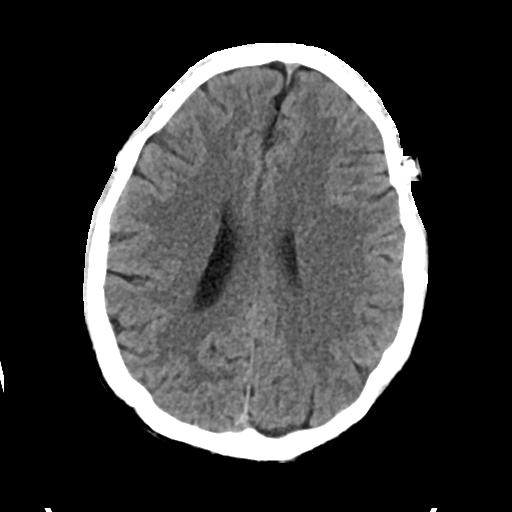
[im 20/32  bone]
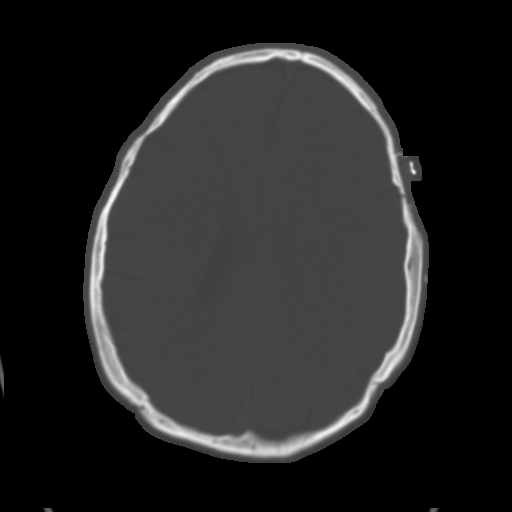
[im 24/32  brain]
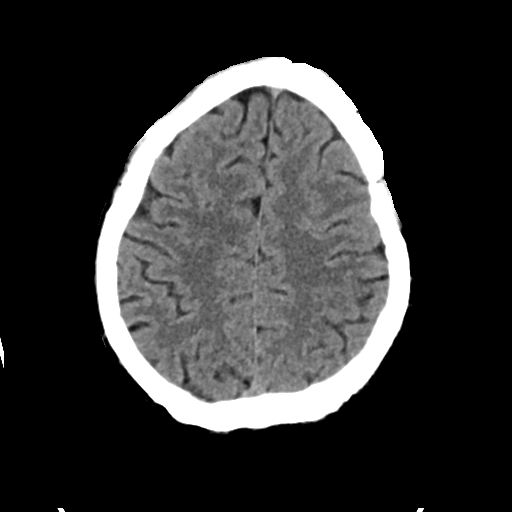
[im 28/32  brain]
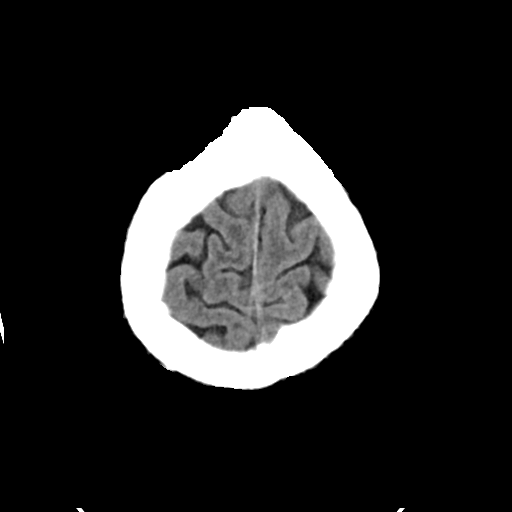

[Series 6: sag soft · sagittal · 0.37mm/px · 3 of 63 slices shown]
[im 21/63  brain]
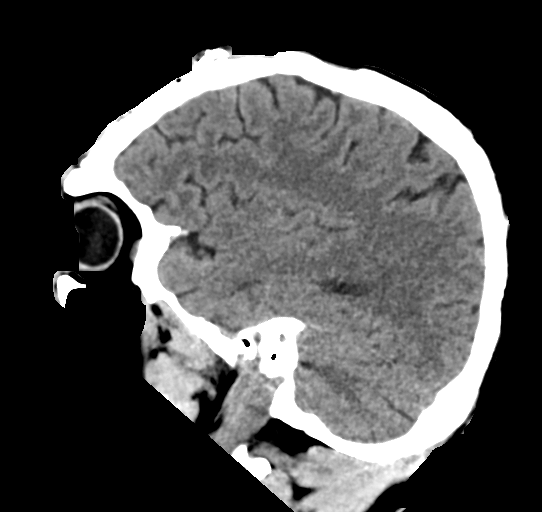
[im 32/63  brain]
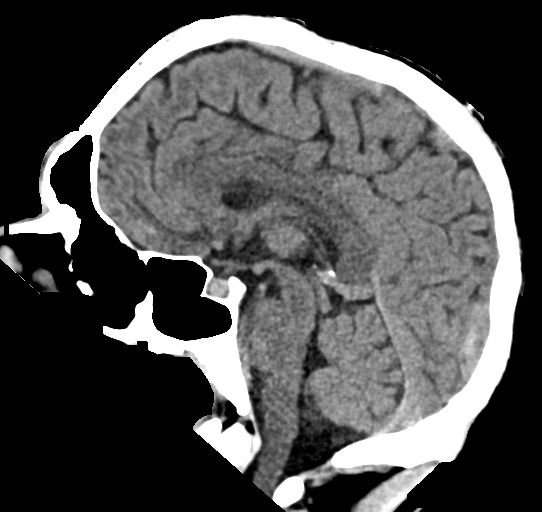
[im 42/63  brain]
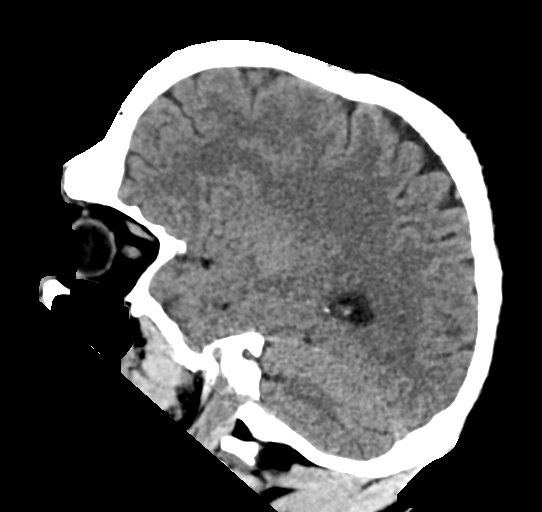

[18 of 37 positions shown; findings below may reference images not displayed]

FINDINGS: Brain: No evidence of acute infarction, hemorrhage, hydrocephalus,
extra-axial collection or mass lesion/mass effect.

Vascular: No hyperdense vessel or unexpected calcification.

Skull: Prior frontal cranioplasty. Negative for acute calvarial
fracture.

Sinuses/Orbits: No acute finding.

Other: None.
IMPRESSION: 1. No acute intracranial findings.
2. Prior frontal cranioplasty.

## 2021-04-13 MED ORDER — PERFLUTREN LIPID MICROSPHERE: 1.0000 mL | INTRAVENOUS | Status: DC | PRN

## 2021-04-13 MED ORDER — METHADONE HCL 5 MG PO TABS
2.5000 mg | ORAL_TABLET | Freq: Two times a day (BID) | ORAL | Status: DC
  Administered 2021-04-13 – 2021-04-14 (×2): 2.5 mg via ORAL
  Filled 2021-04-13 (×2): qty 1

## 2021-04-13 MED ORDER — LACTATED RINGERS IV SOLN: INTRAVENOUS | Status: DC

## 2021-04-13 NOTE — Progress Notes (Signed)
Called by RN. Pt had unwitnessed fall in room. RN had checked on him and he was sleeping in bed. A short time later he got up to use urinal by himself and got dizzy. RN's in hallway outside room heard him fall and report when they first entered room he did not immediately respond. No convulsions or seizure like activity. He fell backward and landed on back and hit head on floor.  No complaints of head, neck or back pain.        Obtain Head CT.  Check Orthostatic BP readings. Start LR IVF at 100 ml/hr.

## 2021-04-13 NOTE — Progress Notes (Signed)
   04/13/21 0401  Assess: MEWS Score  BP (!) 88/39  ECG Heart Rate (!) 119  Assess: MEWS Score  MEWS Temp 0  MEWS Systolic 1  MEWS Pulse 2  MEWS RR 0  MEWS LOC 0  MEWS Score 3  MEWS Score Color Yellow  Assess: if the MEWS score is Yellow or Red  Were vital signs taken at a resting state? Yes  Focused Assessment Change from prior assessment (see assessment flowsheet) (positive orthostatics and possible syncopal episode with fall)  Early Detection of Sepsis Score *See Row Information* Low  MEWS guidelines implemented *See Row Information* Yes  Treat  MEWS Interventions Escalated (See documentation below)  Take Vital Signs  Increase Vital Sign Frequency  Yellow: Q 2hr X 2 then Q 4hr X 2, if remains yellow, continue Q 4hrs  Escalate  MEWS: Escalate Yellow: discuss with charge nurse/RN and consider discussing with provider and RRT  Notify: Charge Nurse/RN  Name of Charge Nurse/RN Notified Adolphus Birchwood, RN  Date Charge Nurse/RN Notified 04/13/21  Time Charge Nurse/RN Notified 0330  Notify: Provider  Provider Name/Title Dr. Rachael Darby  Date Provider Notified 04/13/21  Time Provider Notified [phone]  Notification Type Page  Notification Reason Change in status (patient fall)  Provider response See new orders (call back)  Date of Provider Response 04/13/21  Time of Provider Response [phone]  Document  Patient Outcome Stabilized after interventions  Progress note created (see row info) Yes  Assess: SIRS CRITERIA  SIRS Temperature  0  SIRS Pulse 1  SIRS Respirations  0  SIRS WBC 0  SIRS Score Sum  1

## 2021-04-13 NOTE — Progress Notes (Addendum)
PROGRESS NOTE    Duane Price  YTK:[id number] DOB: [birth year] DOA: 03/25/2021 PCP: Patient, No Pcp Per (Inactive)    Chief Complaint  Patient presents with   Chest Injury    Brief Narrative:   History of IVDU with daily heroin, found to have MRSA bacteremia, septic pulmonary emboli , tricuspid valve vegetation status post angio Vac debridement  He is on IV antibiotics He is also started on anticoagulation for acute PE , diagnosed on 7/19  Subjective:   Patient had an unwitnessed fall last night due to feeling dizzy when getting up, orthostatic  vital sign check and he was orthostatic, bp dropped to 88/39 upon standing, he started on hydration overnight  Currently he denies acute complaints  Assessment & Plan:   Principal Problem:   MRSA bacteremia Active Problems:   Septic embolism (HCC)   Normocytic anemia   IV drug abuse (HCC)   Endocarditis of tricuspid valve   HCV antibody positive   MRSA bacteremia Tricuspid valve endocarditis, status post angio vac debridement on 7/5 Septic pulmonary emboli -Appreciate ID and thoracic surgery input -Last blood culture from 7/7 is clean -ID recommended total 6 weeks of antibiotics with iv vanc first 4-week ( from 7/5 to 8/2) , then transition to p.o. linezolid 600 mg twice daily for the last 2 weeks  -Please reengage ID around beginning of august to  reassess prior to discharge, ID would like to repeat echo at the end of abx therapy as well  Thoracic pain Negative MRI spine on 7/17  Prior hep C infection, cleared  Acute PE/single posterior left lower lobe branch segmental pulmonary PE -Identified on CTA on 7/19 -Started on Lovenox -Echocardiogram and lower extremity Doppler was ordered yesterday, will follow up on result  Normocytic anemia -In acute illness -Hemoglobin was normal in 2019, hemoglobin on presentation 12.6, hemoglobin range from 8-9 in last few weeks -No overt bleeding -Monitor hemoglobin  intermittently  Leukocytosis/thrombocytosis -Likely reactive -WBC improving Monitor platelet number  Hyponatremia Resolved   Polysubstance abuse/IVDU -He report in the past to use methadone, he would like to start methadone here in hospital, he understand you need to follow-up with methadone clinic immediately after discharge -Report he used to follow at methadone clinic across the street, he knows where to go -Start low-dose methadone and titrate      Body mass index is 20.14 kg/m.Marland Kitchen       Unresulted Labs (From admission, onward)     Start     Ordered   04/11/21 0500  C-reactive protein  Weekly,   R     Question:  Specimen collection method  Answer:  Lab=Lab collect   04/06/21 1321   03/29/21 1635  Acid Fast Culture with reflexed sensitivities  RELEASE UPON ORDERING,   TIMED       Comments: Specimen A: Phone #{PHONE #:[phone] Immunocompromised?  No  Antibiotic Treatment:  zenacef 2 grams Is the patient on airborne/droplet precautions? No Clinical History:  Tricuspid valve vegitation Special Instructions:  none Specimen Disposition:  Microbiology     03/29/21 1635              DVT prophylaxis:   Was on prophylaxis Lovenox , on therapeutic Lovenox from 7/19   Code Status: Full Family Communication: Patient Disposition:   Status is: Inpatient  Dispo: The patient is from: Home              Anticipated d/c is to: Home  Anticipated d/c date is: Early August after reevaluated and cleared by ID                Consultants:  ID Thoracic surgery   Procedures:  Angio VAC debridement of tricuspid valve vegetation  Antimicrobials:   Iv vanc     Objective: Vitals:   04/13/21 0615 04/13/21 0813 04/13/21 1205 04/13/21 1615  BP: 110/73 114/68 114/69 117/70  Pulse: (!) 101 (!) 107 (!) 103   Resp: 18 17 18 17   Temp: 97.8 F (36.6 C) 97.7 F (36.5 C) 97.8 F (36.6 C) 97.9 F (36.6 C)  TempSrc: Oral Oral Oral Oral  SpO2: 96% 95% 100%  100%  Weight:      Height:        Intake/Output Summary (Last 24 hours) at 04/13/2021 1822 Last data filed at 04/13/2021 1738 Gross per 24 hour  Intake 473.83 ml  Output 1175 ml  Net -701.17 ml   Filed Weights   03/25/21 0304 03/27/21 1917 04/12/21 0636  Weight: 72.6 kg 70.9 kg 67.4 kg    Examination:  General exam: calm, NAD Respiratory system: Clear to auscultation. Respiratory effort normal. Cardiovascular system: S1 & S2 heard, RRR. No pedal edema. Gastrointestinal system: Abdomen is nondistended, soft and nontender. No organomegaly or masses felt. Normal bowel sounds heard. Central nervous system: Alert and oriented. No focal neurological deficits. Extremities: Symmetric 5 x 5 power. Skin: No rashes, lesions or ulcers Psychiatry: Judgement and insight appear normal. Mood & affect appropriate.     Data Reviewed: I have personally reviewed following labs and imaging studies  CBC: Recent Labs  Lab 04/07/21 0214 04/08/21 0224 04/10/21 0820 04/11/21 0557  WBC 12.5* 12.1* 10.0 11.7*  NEUTROABS 9.4* 9.0*  --  8.5*  HGB 8.4* 7.9* 7.8* 8.7*  HCT 24.8* 24.3* 23.9* 26.6*  MCV 84.9 87.7 87.9 88.1  PLT 406* 437* 464* 514*    Basic Metabolic Panel: Recent Labs  Lab 04/07/21 0214 04/08/21 0224 04/11/21 0557 04/12/21 0610  NA 133* 134* 137  --   K 3.9 3.8 4.1  --   CL 102 105 104  --   CO2 26 24 27   --   GLUCOSE 104* 121* 120*  --   BUN 11 13 17   --   CREATININE 0.66 0.69 0.65 0.64  CALCIUM 8.0* 7.9* 8.4*  --   MG 2.1 1.8 1.8  --   PHOS 3.8 4.0 3.8  --     GFR: Estimated Creatinine Clearance: 126.4 mL/min (by C-G formula based on SCr of 0.64 mg/dL).  Liver Function Tests: Recent Labs  Lab 04/07/21 0214 04/08/21 0224 04/11/21 0557  AST 10* 10* 13*  ALT 21 17 18   ALKPHOS 101 100 93  BILITOT 0.3 0.3 0.5  PROT 6.3* 6.5 7.1  ALBUMIN 1.7* 1.7* 2.0*    CBG: No results for input(s): GLUCAP in the last 168 hours.   No results found for this or any  previous visit (from the past 240 hour(s)).       Radiology Studies: CT HEAD WO CONTRAST  Result Date: 04/13/2021 CLINICAL DATA:  Syncope EXAM: CT HEAD WITHOUT CONTRAST TECHNIQUE: Contiguous axial images were obtained from the base of the skull through the vertex without intravenous contrast. COMPARISON:  01/25/2009 FINDINGS: Brain: No evidence of acute infarction, hemorrhage, hydrocephalus, extra-axial collection or mass lesion/mass effect. Vascular: No hyperdense vessel or unexpected calcification. Skull: Prior frontal cranioplasty. Negative for acute calvarial fracture. Sinuses/Orbits: No acute finding. Other: None. IMPRESSION: 1. No  acute intracranial findings. 2. Prior frontal cranioplasty. Electronically Signed   By: Duanne Guess D.O.   On: 04/13/2021 08:03   CT Angio Chest Pulmonary Embolism (PE) W or WO Contrast  Result Date: 04/12/2021 CLINICAL DATA:  Chest pain, shortness of breath EXAM: CT ANGIOGRAPHY CHEST WITH CONTRAST TECHNIQUE: Multidetector CT imaging of the chest was performed using the standard protocol during bolus administration of intravenous contrast. Multiplanar CT image reconstructions and MIPs were obtained to evaluate the vascular anatomy. CONTRAST:  65mL OMNIPAQUE IOHEXOL 350 MG/ML SOLN COMPARISON:  03/25/2021 FINDINGS: Cardiovascular: Heart size normal. Trace pericardial fluid. The RV is nondilated. There is good contrast opacification of the pulmonary arterial tree. Single solitary segmental occlusion in the medial basal segment left lower lobe pulmonary artery branch, new since previous. Adequate contrast opacification of the thoracic aorta with no evidence of dissection, aneurysm, or stenosis. There is classic 3-vessel brachiocephalic arch anatomy without proximal stenosis. No significant atheromatous change. Mediastinum/Nodes: Subcentimeter prevascular and AP window lymph nodes. No hilar adenopathy. No mediastinal mass or hemorrhage. Lungs/Pleura: Multiple  predominantly peripheral nodular opacities, some cavitary. Overall increase in disease load compared to prior study, although some discrete lesions have clearly enlarged e.g. posterior right upper lobe image 31/7, others improved e.g. lateral left upper lobe image 35/7. There is new confluent airspace consolidation in the left lower lobe superior and posterior basal segments. No pneumothorax. No pleural effusion. Upper Abdomen: No acute findings. Musculoskeletal: No chest wall abnormality. No acute or significant osseous findings. Review of the MIP images confirms the above findings. IMPRESSION: 1. Interval progression of bilateral septic pulmonary emboli. 2. Single posterior left lower lobe branch segmental pulmonary embolus. Electronically Signed   By: Corlis Leak M.D.   On: 04/12/2021 15:35   VAS [place] LOWER EXTREMITY VENOUS (DVT)  Result Date: 04/13/2021  Lower Venous DVT Study Patient Name:  MAOR MECKEL  Date of Exam:   04/13/2021 Medical Rec #: [id number]      Accession #:    [id number] Date of Birth: 1985-12-21      Patient Gender: M Patient Age:   34Y Exam Location:  [facility] Procedure:      VAS [place] LOWER EXTREMITY VENOUS (DVT) Referring Phys: [id number] A CALDWELL POWELL JR --------------------------------------------------------------------------------  Indications: Pulmonary embolism.  Comparison Study: no prior Performing Technologist: [place] Ponder RVS  Examination Guidelines: A complete evaluation includes B-mode imaging, spectral Doppler, color Doppler, and power Doppler as needed of all accessible portions of each vessel. Bilateral testing is considered an integral part of a complete examination. Limited examinations for reoccurring indications may be performed as noted. The reflux portion of the exam is performed with the patient in reverse Trendelenburg.    *See table(s) above for measurements and observations.    Preliminary    ECHOCARDIOGRAM LIMITED  Result Date: 04/13/2021     ECHOCARDIOGRAM LIMITED REPORT   Patient Name:   LAQUINN SHIPPY Date of Exam: 04/13/2021 Medical Rec #:  [id number]     Height:       72.0 in Accession #:    [id number]    Weight:       148.5 lb Date of Birth:  1986-03-12     BSA:          1.877 m Patient Age:    34 years      BP:           114/68 mmHg Patient Gender: M             HR:  109 bpm. Exam Location:  Inpatient Procedure: Cardiac Doppler, Color Doppler and Limited Echo Indications:    I26.02 Pulmonary embolus  History:        Patient has prior history of Echocardiogram examinations, most                 recent 03/29/2021. Angiovac 03/29/21; Endocarditis. IVDU                 hospitalized for 21 days now.  Sonographer:    Roosvelt Maser RDCS Referring Phys: [id number] Lile Mccurley IMPRESSIONS  1. Left ventricular ejection fraction, by estimation, is 65 to 70%. The left ventricle has hyperdynamic function. The left ventricle has no regional wall motion abnormalities.  2. Peak RV-RA gradient 33 mmHg. IVC not visualized. Right ventricular systolic function is normal. The right ventricular size is normal.  3. The mitral valve is normal in structure. No evidence of mitral valve regurgitation. No evidence of mitral stenosis. No vegetation.  4. 1.2 x 1 cm mobile vegetation on tricuspid valve, still significant but smaller than prior. The tricuspid valve is abnormal. Tricuspid valve regurgitation is mild.  5. The aortic valve is tricuspid. Aortic valve regurgitation is not visualized. No aortic stenosis is present. No vegetation. FINDINGS  Left Ventricle: Left ventricular ejection fraction, by estimation, is 65 to 70%. The left ventricle has hyperdynamic function. The left ventricle has no regional wall motion abnormalities. There is no left ventricular hypertrophy. Right Ventricle: Peak RV-RA gradient 33 mmHg. IVC not visualized. The right ventricular size is normal. No increase in right ventricular wall thickness. Right ventricular systolic function is normal. Left Atrium:  Left atrial size was normal in size. Right Atrium: Right atrial size was normal in size. Pericardium: Trivial pericardial effusion is present. Mitral Valve: The mitral valve is normal in structure. No evidence of mitral valve stenosis. Tricuspid Valve: 1.2 x 1 cm mobile vegetation on tricuspid valve, still significant but smaller than prior. The tricuspid valve is abnormal. Tricuspid valve regurgitation is mild. Aortic Valve: The aortic valve is tricuspid. Aortic valve regurgitation is not visualized. No aortic stenosis is present. Pulmonic Valve: The pulmonic valve was normal in structure. Pulmonic valve regurgitation is not visualized. Aorta: The aortic root is normal in size and structure. IAS/Shunts: No atrial level shunt detected by color flow Doppler. LEFT VENTRICLE PLAX 2D LVIDd:         4.10 cm LVIDs:         2.30 cm LV PW:         0.60 cm LV IVS:        1.00 cm  LEFT ATRIUM         Index LA diam:    3.90 cm 2.08 cm/m   AORTA Ao Root diam: 2.70 cm TRICUSPID VALVE TR Peak grad:   32.7 mmHg TR Vmax:        286.00 cm/s Marca Ancona MD Electronically signed by Marca Ancona MD Signature Date/Time: 04/13/2021/1:14:33 PM    Final         Scheduled Meds:  enoxaparin (LOVENOX) injection  70 mg Subcutaneous Q12H   feeding supplement  237 mL Oral BID BM   lidocaine  1 patch Transdermal Q24H   methadone  2.5 mg Oral BID   nicotine  21 mg Transdermal Daily   pantoprazole  40 mg Oral Daily   Continuous Infusions:  sodium chloride 10 mL/hr at 04/12/21 2148   lactated ringers 100 mL/hr at 04/13/21 1350   vancomycin 1,250 mg (04/13/21  5597)     LOS: 19 days   Time spent: Greater than 50% of this time was spent in counseling, explanation of diagnosis, planning of further management, and coordination of care.   Voice Recognition Reubin Milan dictation system was used to create this note, attempts have been made to correct errors. Please contact the author with questions and/or  clarifications.   Albertine Grates, MD PhD FACP [facility]  Available via Epic secure chat 7am-7pm for nonurgent issues Please page for urgent issues To page the attending provider between 7A-7P or the covering provider during after hours 7P-7A, please log into the web site [email or web address] and access using universal [place] password for that web site. If you do not have the password, please call the hospital operator.    04/13/2021, 6:22 PM

## 2021-04-13 NOTE — Progress Notes (Signed)
Lower extremity venous has been completed.   Preliminary results in CV Proc.   Blanch Media 04/13/2021 11:36 AM

## 2021-04-13 NOTE — Progress Notes (Signed)
   04/13/21 0430  What Happened  Was fall witnessed? No  Was patient injured? No  Patient found on floor (lying on his back)  Found by Staff-comment Adolphus Birchwood, RN at [phone])  Stated prior activity other (comment) (Pt states he was standing at bedside using urinal.  He got dizzy and fell.)  Follow Up  MD notified Dr. Rachael Darby  Time MD notified [phone]  Family notified No - patient refusal  Additional tests Yes-comment (Head CT ordered)  Progress note created (see row info) Yes  PCA/Epidural/Spinal Assessment  Respiratory Pattern Regular;Unlabored  Neurological  Neuro (WDL) WDL  Level of Consciousness Alert  Orientation Level Oriented X4  Cognition Appropriate at baseline  Speech Clear  Pupil Assessment  Yes  R Pupil Size (mm) 3  R Pupil Shape Round  R Pupil Reaction Brisk  L Pupil Size (mm) 3  L Pupil Shape Round  L Pupil Reaction Brisk  Additional Pupil Assessments No  Neuro Symptoms None  Glasgow Coma Scale  Eye Opening 4  Best Verbal Response (NON-intubated) 5  Best Motor Response 6  Glasgow Coma Scale Score 15  Musculoskeletal  Musculoskeletal (WDL) WDL  Assistive Device None  Generalized Weakness Yes  Weight Bearing Restrictions No  Integumentary  Integumentary (WDL) X  Skin Color Appropriate for ethnicity  Skin Condition Dry  Skin Integrity Intact;Cracking  Cracking Location Foot  Cracking Location Orientation Bilateral  Cracking Intervention Other (Comment) (assessed)  Skin Turgor Non-tenting  Pain Assessment  Date Pain First Started 03/25/21  Result of Injury No  Pain Assessment  Work-Related Injury No  Pain Screening  Clinical Progression Not changed

## 2021-04-14 MED ORDER — METHADONE HCL 5 MG PO TABS
5.0000 mg | ORAL_TABLET | Freq: Two times a day (BID) | ORAL | Status: DC
  Administered 2021-04-14 – 2021-04-16 (×4): 5 mg via ORAL
  Filled 2021-04-14 (×4): qty 1

## 2021-04-14 MED ORDER — OXYCODONE HCL 5 MG PO TABS
15.0000 mg | ORAL_TABLET | ORAL | Status: DC | PRN
  Administered 2021-04-14 – 2021-04-15 (×3): 15 mg via ORAL
  Filled 2021-04-14 (×4): qty 3

## 2021-04-14 MED ORDER — OXYCODONE HCL 5 MG PO TABS
15.0000 mg | ORAL_TABLET | Freq: Once | ORAL | Status: AC
  Administered 2021-04-14: 15 mg via ORAL
  Filled 2021-04-14: qty 3

## 2021-04-14 MED ORDER — GUAIFENESIN ER 600 MG PO TB12
600.0000 mg | ORAL_TABLET | Freq: Two times a day (BID) | ORAL | Status: DC
  Administered 2021-04-14 – 2021-04-19 (×10): 600 mg via ORAL
  Filled 2021-04-14 (×10): qty 1

## 2021-04-14 MED ORDER — LACTATED RINGERS IV SOLN: INTRAVENOUS | Status: AC

## 2021-04-14 MED ORDER — OXYCODONE HCL 5 MG PO TABS: 5.0000 mg | ORAL_TABLET | ORAL | Status: DC | PRN

## 2021-04-14 MED ORDER — IBUPROFEN 200 MG PO TABS
400.0000 mg | ORAL_TABLET | Freq: Three times a day (TID) | ORAL | Status: AC
  Administered 2021-04-14 – 2021-04-15 (×3): 400 mg via ORAL
  Filled 2021-04-14 (×3): qty 2

## 2021-04-14 NOTE — Progress Notes (Signed)
PROGRESS NOTE    Duane Price  ZOX:[id number] DOB: March 05, 1986 DOA: 03/25/2021 PCP: Patient, No Pcp Per (Inactive)    Chief Complaint  Patient presents with   Chest Injury    Brief Narrative:   History of IVDU with daily heroin, found to have MRSA bacteremia, septic pulmonary emboli , tricuspid valve vegetation status post angio Vac debridement  He is on IV antibiotics He is also started on anticoagulation for acute PE , diagnosed on 7/19  Subjective:  C/o chest pain and back pain , not well controlled, occasional cough with pleuritic chest pain Report methadone is helping He want pain medication increased He thinks his fall the other night was due to dehydration He received hydration, he is feeling better    Assessment & Plan:   Principal Problem:   MRSA bacteremia Active Problems:   Septic embolism (HCC)   Normocytic anemia   IV drug abuse (HCC)   Endocarditis of tricuspid valve   HCV antibody positive   MRSA bacteremia Tricuspid valve endocarditis, status post angio vac debridement on 7/5 Septic pulmonary emboli -Appreciate ID and thoracic surgery input -Last blood culture from 7/7 is clean -ID recommended total 6 weeks of antibiotics with iv vanc first 4-week ( from 7/5 to 8/2) , then transition to p.o. linezolid 600 mg twice daily for the last 2 weeks  -Please reengage ID around beginning of august to  reassess prior to discharge, ID would like to repeat echo at the end of abx therapy as well  Thoracic pain Negative MRI spine on 7/17  Prior hep C infection, cleared  Acute PE/single posterior left lower lobe branch segmental pulmonary PE -Identified on CTA on 7/19 -Started on Lovenox -Echocardiogram and lower extremity Doppler was ordered yesterday, will follow up on result  Normocytic anemia -In acute illness -Hemoglobin was normal in 2019, hemoglobin on presentation 12.6, hemoglobin range from 8-9 in last few weeks -No overt bleeding -Monitor  hemoglobin intermittently  Leukocytosis/thrombocytosis -Likely reactive -WBC improving Monitor platelet number  Hyponatremia Resolved   Polysubstance abuse/IVDU -He report in the past to use methadone, he would like to start methadone here in hospital, he understand you need to follow-up with methadone clinic immediately after discharge -Report he used to follow at methadone clinic across the street, he knows where to go -Start low-dose methadone and titrate, monitor QTC      Body mass index is 20.14 kg/m.Marland Kitchen.       Unresulted Labs (From admission, onward)     Start     Ordered   04/15/21 0500  CBC  Tomorrow morning,   R       Question:  Specimen collection method  Answer:  Lab=Lab collect   04/14/21 1804   04/15/21 0500  Basic metabolic panel  Tomorrow morning,   R       Question:  Specimen collection method  Answer:  Lab=Lab collect   04/14/21 1804   04/15/21 0500  Magnesium  Tomorrow morning,   R       Question:  Specimen collection method  Answer:  Lab=Lab collect   04/14/21 1804   04/15/21 0500  Phosphorus  Tomorrow morning,   R       Question:  Specimen collection method  Answer:  Lab=Lab collect   04/14/21 1804   04/11/21 0500  C-reactive protein  Weekly,   R     Question:  Specimen collection method  Answer:  Lab=Lab collect   04/06/21 1321   03/29/21 1635  Acid Fast Culture with reflexed sensitivities  RELEASE UPON ORDERING,   TIMED       Comments: Specimen A: Phone #{PHONE #:[phone] Immunocompromised?  No  Antibiotic Treatment:  zenacef 2 grams Is the patient on airborne/droplet precautions? No Clinical History:  Tricuspid valve vegitation Special Instructions:  none Specimen Disposition:  Microbiology     03/29/21 1635              DVT prophylaxis:   Was on prophylaxis Lovenox , on therapeutic Lovenox from 7/19   Code Status: Full Family Communication: Patient Disposition:   Status is: Inpatient  Dispo: The patient is from: Home               Anticipated d/c is to: Home              Anticipated d/c date is: Early August after reevaluated and cleared by ID                Consultants:  ID Thoracic surgery   Procedures:  Angio VAC debridement of tricuspid valve vegetation  Antimicrobials:   Iv vanc     Objective: Vitals:   04/13/21 2307 04/14/21 0344 04/14/21 0800 04/14/21 1217  BP: 114/66 101/70 121/65 111/71  Pulse: (!) 115 95 (!) 108 (!) 105  Resp: 17 17 20 19   Temp: 98.3 F (36.8 C) 98 F (36.7 C) 98.8 F (37.1 C) 99 F (37.2 C)  TempSrc: Oral Oral Oral Oral  SpO2: 96% 98% 96% 97%  Weight:      Height:        Intake/Output Summary (Last 24 hours) at 04/14/2021 1805 Last data filed at 04/14/2021 1551 Gross per 24 hour  Intake 480 ml  Output 3250 ml  Net -2770 ml   Filed Weights   03/25/21 0304 03/27/21 1917 04/12/21 0636  Weight: 72.6 kg 70.9 kg 67.4 kg    Examination:  General exam: calm, NAD Respiratory system: Clear to auscultation. Respiratory effort normal. Cardiovascular system: S1 & S2 heard, RRR. No pedal edema. Gastrointestinal system: Abdomen is nondistended, soft and nontender. No organomegaly or masses felt. Normal bowel sounds heard. Central nervous system: Alert and oriented. No focal neurological deficits. Extremities: Symmetric 5 x 5 power. Skin: No rashes, lesions or ulcers Psychiatry: Judgement and insight appear normal. Mood & affect appropriate.     Data Reviewed: I have personally reviewed following labs and imaging studies  CBC: Recent Labs  Lab 04/08/21 0224 04/10/21 0820 04/11/21 0557  WBC 12.1* 10.0 11.7*  NEUTROABS 9.0*  --  8.5*  HGB 7.9* 7.8* 8.7*  HCT 24.3* 23.9* 26.6*  MCV 87.7 87.9 88.1  PLT 437* 464* 514*    Basic Metabolic Panel: Recent Labs  Lab 04/08/21 0224 04/11/21 0557 04/12/21 0610  NA 134* 137  --   K 3.8 4.1  --   CL 105 104  --   CO2 24 27  --   GLUCOSE 121* 120*  --   BUN 13 17  --   CREATININE 0.69 0.65 0.64  CALCIUM  7.9* 8.4*  --   MG 1.8 1.8  --   PHOS 4.0 3.8  --     GFR: Estimated Creatinine Clearance: 126.4 mL/min (by C-G formula based on SCr of 0.64 mg/dL).  Liver Function Tests: Recent Labs  Lab 04/08/21 0224 04/11/21 0557  AST 10* 13*  ALT 17 18  ALKPHOS 100 93  BILITOT 0.3 0.5  PROT 6.5 7.1  ALBUMIN 1.7* 2.0*  CBG: No results for input(s): GLUCAP in the last 168 hours.   No results found for this or any previous visit (from the past 240 hour(s)).       Radiology Studies: CT HEAD WO CONTRAST  Result Date: 04/13/2021 CLINICAL DATA:  Syncope EXAM: CT HEAD WITHOUT CONTRAST TECHNIQUE: Contiguous axial images were obtained from the base of the skull through the vertex without intravenous contrast. COMPARISON:  01/25/2009 FINDINGS: Brain: No evidence of acute infarction, hemorrhage, hydrocephalus, extra-axial collection or mass lesion/mass effect. Vascular: No hyperdense vessel or unexpected calcification. Skull: Prior frontal cranioplasty. Negative for acute calvarial fracture. Sinuses/Orbits: No acute finding. Other: None. IMPRESSION: 1. No acute intracranial findings. 2. Prior frontal cranioplasty. Electronically Signed   By: Duanne Guess D.O.   On: 04/13/2021 08:03   VAS [place] LOWER EXTREMITY VENOUS (DVT)  Result Date: 04/13/2021  Lower Venous DVT Study Patient Name:  Duane Price  Date of Exam:   04/13/2021 Medical Rec #: [id number]      Accession #:    [id number] Date of Birth: [birth year]      Patient Gender: M Patient Age:   34Y Exam Location:  [facility] Procedure:      VAS [place] LOWER EXTREMITY VENOUS (DVT) Referring Phys: [id number] A CALDWELL POWELL JR --------------------------------------------------------------------------------  Indications: Pulmonary embolism.  Comparison Study: no prior Performing Technologist: [place] Ponder RVS  Examination Guidelines: A complete evaluation includes B-mode imaging, spectral Doppler, color Doppler, and power Doppler as needed of all  accessible portions of each vessel. Bilateral testing is considered an integral part of a complete examination. Limited examinations for reoccurring indications may be performed as noted. The reflux portion of the exam is performed with the patient in reverse Trendelenburg.    *See table(s) above for measurements and observations.    Preliminary    ECHOCARDIOGRAM LIMITED  Result Date: 04/13/2021    ECHOCARDIOGRAM LIMITED REPORT   Patient Name:   Duane Price Date of Exam: 04/13/2021 Medical Rec #:  [id number]     Height:       72.0 in Accession #:    [id number]    Weight:       148.5 lb Date of Birth:  07/25/1986     BSA:          1.877 m Patient Age:    34 years      BP:           114/68 mmHg Patient Gender: M             HR:           109 bpm. Exam Location:  Inpatient Procedure: Cardiac Doppler, Color Doppler and Limited Echo Indications:    I26.02 Pulmonary embolus  History:        Patient has prior history of Echocardiogram examinations, most                 recent 03/29/2021. Angiovac 03/29/21; Endocarditis. IVDU                 hospitalized for 21 days now.  Sonographer:    Roosvelt Maser RDCS Referring Phys: [id number] Haylee Mcanany IMPRESSIONS  1. Left ventricular ejection fraction, by estimation, is 65 to 70%. The left ventricle has hyperdynamic function. The left ventricle has no regional wall motion abnormalities.  2. Peak RV-RA gradient 33 mmHg. IVC not visualized. Right ventricular systolic function is normal. The right ventricular size is normal.  3. The mitral valve is normal in structure. No evidence  of mitral valve regurgitation. No evidence of mitral stenosis. No vegetation.  4. 1.2 x 1 cm mobile vegetation on tricuspid valve, still significant but smaller than prior. The tricuspid valve is abnormal. Tricuspid valve regurgitation is mild.  5. The aortic valve is tricuspid. Aortic valve regurgitation is not visualized. No aortic stenosis is present. No vegetation. FINDINGS  Left Ventricle: Left ventricular  ejection fraction, by estimation, is 65 to 70%. The left ventricle has hyperdynamic function. The left ventricle has no regional wall motion abnormalities. There is no left ventricular hypertrophy. Right Ventricle: Peak RV-RA gradient 33 mmHg. IVC not visualized. The right ventricular size is normal. No increase in right ventricular wall thickness. Right ventricular systolic function is normal. Left Atrium: Left atrial size was normal in size. Right Atrium: Right atrial size was normal in size. Pericardium: Trivial pericardial effusion is present. Mitral Valve: The mitral valve is normal in structure. No evidence of mitral valve stenosis. Tricuspid Valve: 1.2 x 1 cm mobile vegetation on tricuspid valve, still significant but smaller than prior. The tricuspid valve is abnormal. Tricuspid valve regurgitation is mild. Aortic Valve: The aortic valve is tricuspid. Aortic valve regurgitation is not visualized. No aortic stenosis is present. Pulmonic Valve: The pulmonic valve was normal in structure. Pulmonic valve regurgitation is not visualized. Aorta: The aortic root is normal in size and structure. IAS/Shunts: No atrial level shunt detected by color flow Doppler. LEFT VENTRICLE PLAX 2D LVIDd:         4.10 cm LVIDs:         2.30 cm LV PW:         0.60 cm LV IVS:        1.00 cm  LEFT ATRIUM         Index LA diam:    3.90 cm 2.08 cm/m   AORTA Ao Root diam: 2.70 cm TRICUSPID VALVE TR Peak grad:   32.7 mmHg TR Vmax:        286.00 cm/s Marca Ancona MD Electronically signed by Marca Ancona MD Signature Date/Time: 04/13/2021/1:14:33 PM    Final         Scheduled Meds:  enoxaparin (LOVENOX) injection  70 mg Subcutaneous Q12H   feeding supplement  237 mL Oral BID BM   guaiFENesin  600 mg Oral BID   ibuprofen  400 mg Oral TID   lidocaine  1 patch Transdermal Q24H   methadone  5 mg Oral BID   nicotine  21 mg Transdermal Daily   pantoprazole  40 mg Oral Daily   Continuous Infusions:  sodium chloride 10 mL/hr at  04/12/21 2148   lactated ringers 100 mL/hr at 04/14/21 1235   vancomycin 1,250 mg (04/14/21 0949)     LOS: 20 days   Time spent: Greater than 50% of this time was spent in counseling, explanation of diagnosis, planning of further management, and coordination of care.   Voice Recognition Reubin Milan dictation system was used to create this note, attempts have been made to correct errors. Please contact the author with questions and/or clarifications.   Albertine Grates, MD PhD FACP [facility]  Available via Epic secure chat 7am-7pm for nonurgent issues Please page for urgent issues To page the attending provider between 7A-7P or the covering provider during after hours 7P-7A, please log into the web site [email or web address] and access using universal [place] password for that web site. If you do not have the password, please call the hospital operator.    04/14/2021, 6:05 PM

## 2021-04-15 LAB — CBC
HCT: 26 % — ABNORMAL LOW (ref 39.0–52.0)
Hemoglobin: 8.5 g/dL — ABNORMAL LOW (ref 13.0–17.0)
MCH: 29.9 pg (ref 26.0–34.0)
MCHC: 32.7 g/dL (ref 30.0–36.0)
MCV: 91.5 fL (ref 80.0–100.0)
Platelets: 375 10*3/uL (ref 150–400)
RBC: 2.84 MIL/uL — ABNORMAL LOW (ref 4.22–5.81)
RDW: 17.2 % — ABNORMAL HIGH (ref 11.5–15.5)
WBC: 14.7 10*3/uL — ABNORMAL HIGH (ref 4.0–10.5)
nRBC: 0 % (ref 0.0–0.2)

## 2021-04-15 LAB — BASIC METABOLIC PANEL
Anion gap: 7 (ref 5–15)
BUN: 18 mg/dL (ref 6–20)
CO2: 27 mmol/L (ref 22–32)
Calcium: 8.7 mg/dL — ABNORMAL LOW (ref 8.9–10.3)
Chloride: 99 mmol/L (ref 98–111)
Creatinine, Ser: 0.67 mg/dL (ref 0.61–1.24)
GFR, Estimated: >60 mL/min (ref >60)
Glucose, Bld: 119 mg/dL — ABNORMAL HIGH (ref 70–99)
Potassium: 4.2 mmol/L (ref 3.5–5.1)
Sodium: 133 mmol/L — ABNORMAL LOW (ref 135–145)

## 2021-04-15 LAB — PHOSPHORUS: Phosphorus: 4.4 mg/dL (ref 2.5–4.6)

## 2021-04-15 LAB — MAGNESIUM: Magnesium: 2 mg/dL (ref 1.7–2.4)

## 2021-04-15 MED ORDER — OXYCODONE HCL 5 MG PO TABS
5.0000 mg | ORAL_TABLET | ORAL | Status: DC | PRN
  Administered 2021-04-15 – 2021-04-16 (×5): 15 mg via ORAL
  Filled 2021-04-15 (×4): qty 3

## 2021-04-15 NOTE — Progress Notes (Signed)
PROGRESS NOTE    Duane Price  UMP:[id number] DOB: September 11, 1986 DOA: 03/25/2021 PCP: Patient, No Pcp Per (Inactive)    Chief Complaint  Patient presents with   Chest Injury    Brief Narrative:   History of IVDU with daily heroin, found to have MRSA bacteremia, septic pulmonary emboli , tricuspid valve vegetation status post angio Vac debridement  He is on IV antibiotics He is also started on anticoagulation for acute PE , diagnosed on 7/19  Subjective:  C/o chest pain and back pain , slightly better with adjustment of pain regimen  occasional cough with pleuritic chest pain Report methadone is helping Having bm He is on room air, no fever, continue to have sinus tachycardia  C/o insomnia  Assessment & Plan:   Principal Problem:   MRSA bacteremia Active Problems:   Septic embolism (HCC)   Normocytic anemia   IV drug abuse (HCC)   Endocarditis of tricuspid valve   HCV antibody positive   MRSA bacteremia Tricuspid valve endocarditis, status post angio vac debridement on 7/5 Septic pulmonary emboli -Appreciate ID and thoracic surgery input -Last blood culture from 7/7 is clean -ID recommended total 6 weeks of antibiotics with iv vanc first 4-week ( from 7/5 to 8/2) , then transition to p.o. linezolid 600 mg twice daily for the last 2 weeks  -Please reengage ID around beginning of august to  reassess prior to discharge, ID would like to repeat echo at the end of abx therapy as well  Thoracic pain Negative MRI spine on 7/17  Prior hep C infection, cleared  Acute PE/single posterior left lower lobe branch segmental pulmonary PE -Identified on CTA on 7/19 -Started on Lovenox -Echocardiogram and lower extremity Doppler was done, reports still pending  will follow up on result  Normocytic anemia -In acute illness -Hemoglobin was normal in 2019, hemoglobin on presentation 12.6, hemoglobin range from 8-9 in last few weeks -No overt bleeding -Monitor hemoglobin  intermittently  Leukocytosis/thrombocytosis -Likely reactive -WBC improving Monitor platelet number  Hyponatremia Resolved   Polysubstance abuse/IVDU -He report in the past to use methadone, he would like to start methadone here in hospital, he understand you need to follow-up with methadone clinic immediately after discharge -Report he used to follow at methadone clinic across the street, he knows where to go -Start low-dose methadone and titrate, monitor QTC ( qtc 446 on 7/22)      Body mass index is 20.14 kg/m.Marland Kitchen       Unresulted Labs (From admission, onward)     Start     Ordered   04/11/21 0500  C-reactive protein  Weekly,   R     Question:  Specimen collection method  Answer:  Lab=Lab collect   04/06/21 1321   03/29/21 1635  Acid Fast Culture with reflexed sensitivities  RELEASE UPON ORDERING,   TIMED       Comments: Specimen A: Phone #{PHONE #:[phone] Immunocompromised?  No  Antibiotic Treatment:  zenacef 2 grams Is the patient on airborne/droplet precautions? No Clinical History:  Tricuspid valve vegitation Special Instructions:  none Specimen Disposition:  Microbiology     03/29/21 1635              DVT prophylaxis:   Was on prophylaxis Lovenox , on therapeutic Lovenox from 7/19   Code Status: Full Family Communication: Patient Disposition:   Status is: Inpatient  Dispo: The patient is from: Home              Anticipated d/c  is to: Home              Anticipated d/c date is: Early August after reevaluated and cleared by ID                Consultants:  ID Thoracic surgery   Procedures:  Angio VAC debridement of tricuspid valve vegetation  Antimicrobials:   Iv vanc     Objective: Vitals:   04/14/21 0800 04/14/21 1217 04/14/21 2100 04/15/21 0300  BP: 121/65 111/71 108/63 108/61  Pulse: (!) 108 (!) 105 (!) 104 (!) 102  Resp: 20 19 18 19   Temp: 98.8 F (37.1 C) 99 F (37.2 C) 98.3 F (36.8 C) 98 F (36.7 C)  TempSrc: Oral  Oral Oral Oral  SpO2: 96% 97% 97% 98%  Weight:      Height:        Intake/Output Summary (Last 24 hours) at 04/15/2021 1058 Last data filed at 04/15/2021 1030 Gross per 24 hour  Intake 480 ml  Output 2500 ml  Net -2020 ml   Filed Weights   03/25/21 0304 03/27/21 1917 04/12/21 0636  Weight: 72.6 kg 70.9 kg 67.4 kg    Examination:  General exam: calm, NAD Respiratory system: Clear to auscultation. Respiratory effort normal. Cardiovascular system: S1 & S2 heard, RRR. No pedal edema. Gastrointestinal system: Abdomen is nondistended, soft and nontender. No organomegaly or masses felt. Normal bowel sounds heard. Central nervous system: Alert and oriented. No focal neurological deficits. Extremities: Symmetric 5 x 5 power. Skin: No rashes, lesions or ulcers Psychiatry: Judgement and insight appear normal. Mood & affect appropriate.     Data Reviewed: I have personally reviewed following labs and imaging studies  CBC: Recent Labs  Lab 04/10/21 0820 04/11/21 0557 04/15/21 0153  WBC 10.0 11.7* 14.7*  NEUTROABS  --  8.5*  --   HGB 7.8* 8.7* 8.5*  HCT 23.9* 26.6* 26.0*  MCV 87.9 88.1 91.5  PLT 464* 514* 375    Basic Metabolic Panel: Recent Labs  Lab 04/11/21 0557 04/12/21 0610 04/15/21 0153  NA 137  --  133*  K 4.1  --  4.2  CL 104  --  99  CO2 27  --  27  GLUCOSE 120*  --  119*  BUN 17  --  18  CREATININE 0.65 0.64 0.67  CALCIUM 8.4*  --  8.7*  MG 1.8  --  2.0  PHOS 3.8  --  4.4    GFR: Estimated Creatinine Clearance: 126.4 mL/min (by C-G formula based on SCr of 0.67 mg/dL).  Liver Function Tests: Recent Labs  Lab 04/11/21 0557  AST 13*  ALT 18  ALKPHOS 93  BILITOT 0.5  PROT 7.1  ALBUMIN 2.0*    CBG: No results for input(s): GLUCAP in the last 168 hours.   No results found for this or any previous visit (from the past 240 hour(s)).       Radiology Studies: VAS [place] LOWER EXTREMITY VENOUS (DVT)  Result Date: 04/13/2021  Lower Venous DVT  Study Patient Name:  Duane Price  Date of Exam:   04/13/2021 Medical Rec #: [id number]      Accession #:    [id number] Date of Birth: 07-Apr-1986      Patient Gender: M Patient Age:   34Y [facility]  Procedure:      VAS [place] LOWER EXTREMITY VENOUS (DVT) Referring Phys: [id number] A CALDWELL POWELL JR --------------------------------------------------------------------------------  Indications: Pulmonary embolism.  Comparison Study: no prior Performing  Technologist: [place] Ponder RVS  Examination Guidelines: A complete evaluation includes B-mode imaging, spectral Doppler, color Doppler, and power Doppler as needed of all accessible portions of each vessel. Bilateral testing is considered an integral part of a complete examination. Limited examinations for reoccurring indications may be performed as noted. The reflux portion of the exam is performed with the patient in reverse Trendelenburg.    *See table(s) above for measurements and observations.    Preliminary    ECHOCARDIOGRAM LIMITED  Result Date: 04/13/2021    ECHOCARDIOGRAM LIMITED REPORT   Patient Name:   MAURI TOLEN Date of Exam: 04/13/2021 Medical Rec #:  [id number]     Height:       72.0 in Accession #:    [id number]    Weight:       148.5 lb Date of Birth:  03-16-1986     BSA:          1.877 m Patient Age:    34 years      BP:           114/68 mmHg Patient Gender: M             HR:           109 bpm. Exam Location:  Inpatient Procedure: Cardiac Doppler, Color Doppler and Limited Echo Indications:    I26.02 Pulmonary embolus  History:        Patient has prior history of Echocardiogram examinations, most                 recent 03/29/2021. Angiovac 03/29/21; Endocarditis. IVDU                 hospitalized for 21 days now.  Sonographer:    Roosvelt Maser RDCS Referring Phys: [id number] Medford Staheli IMPRESSIONS  1. Left ventricular ejection fraction, by estimation, is 65 to 70%. The left ventricle has hyperdynamic function. The left ventricle has no  regional wall motion abnormalities.  2. Peak RV-RA gradient 33 mmHg. IVC not visualized. Right ventricular systolic function is normal. The right ventricular size is normal.  3. The mitral valve is normal in structure. No evidence of mitral valve regurgitation. No evidence of mitral stenosis. No vegetation.  4. 1.2 x 1 cm mobile vegetation on tricuspid valve, still significant but smaller than prior. The tricuspid valve is abnormal. Tricuspid valve regurgitation is mild.  5. The aortic valve is tricuspid. Aortic valve regurgitation is not visualized. No aortic stenosis is present. No vegetation. FINDINGS  Left Ventricle: Left ventricular ejection fraction, by estimation, is 65 to 70%. The left ventricle has hyperdynamic function. The left ventricle has no regional wall motion abnormalities. There is no left ventricular hypertrophy. Right Ventricle: Peak RV-RA gradient 33 mmHg. IVC not visualized. The right ventricular size is normal. No increase in right ventricular wall thickness. Right ventricular systolic function is normal. Left Atrium: Left atrial size was normal in size. Right Atrium: Right atrial size was normal in size. Pericardium: Trivial pericardial effusion is present. Mitral Valve: The mitral valve is normal in structure. No evidence of mitral valve stenosis. Tricuspid Valve: 1.2 x 1 cm mobile vegetation on tricuspid valve, still significant but smaller than prior. The tricuspid valve is abnormal. Tricuspid valve regurgitation is mild. Aortic Valve: The aortic valve is tricuspid. Aortic valve regurgitation is not visualized. No aortic stenosis is present. Pulmonic Valve: The pulmonic valve was normal in structure. Pulmonic valve regurgitation is not visualized. Aorta: The aortic root is normal in size and structure.  IAS/Shunts: No atrial level shunt detected by color flow Doppler. LEFT VENTRICLE PLAX 2D LVIDd:         4.10 cm LVIDs:         2.30 cm LV PW:         0.60 cm LV IVS:        1.00 cm  LEFT  ATRIUM         Index LA diam:    3.90 cm 2.08 cm/m   AORTA Ao Root diam: 2.70 cm TRICUSPID VALVE TR Peak grad:   32.7 mmHg TR Vmax:        286.00 cm/s Marca Ancona MD Electronically signed by Marca Ancona MD Signature Date/Time: 04/13/2021/1:14:33 PM    Final         Scheduled Meds:  enoxaparin (LOVENOX) injection  70 mg Subcutaneous Q12H   feeding supplement  237 mL Oral BID BM   guaiFENesin  600 mg Oral BID   lidocaine  1 patch Transdermal Q24H   methadone  5 mg Oral BID   nicotine  21 mg Transdermal Daily   pantoprazole  40 mg Oral Daily   Continuous Infusions:  sodium chloride 10 mL/hr at 04/12/21 2148   lactated ringers 100 mL/hr at 04/15/21 1610   vancomycin 1,250 mg (04/15/21 0850)     LOS: 21 days   Time spent: Greater than 50% of this time was spent in counseling, explanation of diagnosis, planning of further management, and coordination of care.   Voice Recognition Reubin Milan dictation system was used to create this note, attempts have been made to correct errors. Please contact the author with questions and/or clarifications.   Albertine Grates, MD PhD FACP [facility]  Available via Epic secure chat 7am-7pm for nonurgent issues Please page for urgent issues To page the attending provider between 7A-7P or the covering provider during after hours 7P-7A, please log into the web site [email or web address] and access using universal [place] password for that web site. If you do not have the password, please call the hospital operator.    04/15/2021, 10:58 AM

## 2021-04-15 NOTE — Progress Notes (Signed)
Pharmacy Antibiotic Note  Duane Price is a 34 y.o. male admitted on 03/25/2021 with MRSA endocarditis w/ TV vegetation, septic emboli.  Pharmacy has been consulted for vancomycin.  Pt's renal function stable. WBC increased to 14 today.   Plan: Continue Vancomycin 1250mg  IV Q12 Vanc peak and Vanc trough planned for 7/25 F/u BMP, renal fxn, clinical status.  Vanc planned until 8/2, followed by 2 weeks linezolid   Height: 6' (182.9 cm) Weight: 67.4 kg (148 lb 8 oz) IBW/kg (Calculated) : 77.6  Temp (24hrs), Avg:98.4 F (36.9 C), Min:98 F (36.7 C), Max:99 F (37.2 C)  Recent Labs  Lab 04/10/21 0753 04/10/21 0820 04/10/21 1012 04/11/21 0557 04/11/21 1157 04/11/21 2029 04/11/21 2329 04/12/21 0610 04/15/21 0153  WBC  --  10.0  --  11.7*  --   --   --   --  14.7*  CREATININE  --   --   --  0.65  --   --   --  0.64 0.67  VANCOTROUGH 7*  --   --   --   --  8*  --   --   --   VANCOPEAK  --   --    < >  --  23*  --  28*  --   --    < > = values in this interval not displayed.     Estimated Creatinine Clearance: 126.4 mL/min (by C-G formula based on SCr of 0.67 mg/dL).    No Known Allergies  Antimicrobials this admission: Cefepime 7/1 >> 7/2 Vancomycin 7/1 >>   Vanc levels:  7/4 VP 32/VT 10 - AUC 530 (at goal) - no change 7/12 VT 7 - no change 7/13 VP 17/VT 16, AUC 403, continue 1250q12 7/17 VT 7/VP 50 (both suspected drawn incorrectly), continue 8/17 7/18 VP 23/VT 8, AUC 420, continue 1250mg  q12h  Microbiology results: 7/7 Bcx: ngtd 7/5 Tissue veg cx: MRSA 7/5 Bcx: 1/4 MRSA 7/1 BCx: 3/3 staph aureus, MecA+, MREJ+ 7/1 UA mod Hgb, + nitrite, specific gravity >1 7/1 HCV ab reactive, HIV neg, hep A/B neg  Thank you for involving pharmacy in this patient's care.  9/1, PharmD PGY1 Ambulatory Care Pharmacy Resident 04/15/2021 9:28 AM  **Pharmacist phone directory can be found on [email or web address] listed under [facility]**

## 2021-04-16 MED ORDER — METHADONE HCL 5 MG PO TABS
7.5000 mg | ORAL_TABLET | Freq: Two times a day (BID) | ORAL | Status: DC
  Administered 2021-04-16 – 2021-04-17 (×2): 7.5 mg via ORAL
  Filled 2021-04-16 (×2): qty 2

## 2021-04-16 MED ORDER — OXYCODONE HCL 5 MG PO TABS
20.0000 mg | ORAL_TABLET | ORAL | Status: DC | PRN
  Administered 2021-04-16 – 2021-04-25 (×47): 20 mg via ORAL
  Filled 2021-04-16 (×48): qty 4

## 2021-04-16 MED ORDER — MELATONIN 3 MG PO TABS
3.0000 mg | ORAL_TABLET | Freq: Every day | ORAL | Status: DC
  Administered 2021-04-16 – 2021-04-24 (×8): 3 mg via ORAL
  Filled 2021-04-16 (×10): qty 1

## 2021-04-16 NOTE — Progress Notes (Signed)
PROGRESS NOTE    Duane Price  ZDG:[id number] DOB: 1985-12-06 DOA: 03/25/2021 PCP: Patient, No Pcp Per (Inactive)    Chief Complaint  Patient presents with   Chest Injury    Brief Narrative:   History of IVDU with daily heroin, found to have MRSA bacteremia, septic pulmonary emboli , tricuspid valve vegetation status post angio Vac debridement  He is on IV antibiotics He is also started on anticoagulation for acute PE , diagnosed on 7/19  Subjective:  C/o chest pain and back pain , slightly better with adjustment of pain regimen  occasional cough with pleuritic chest pain Report methadone is helping, want oxycodone increased to 20mg   Having bm He is on room air, no fever, less sinus tachycardia  He wants cxr repeated in the next 2-3 days to make sure infection is clearing up  Assessment & Plan:   Principal Problem:   MRSA bacteremia Active Problems:   Septic embolism (HCC)   Normocytic anemia   IV drug abuse (HCC)   Endocarditis of tricuspid valve   HCV antibody positive   MRSA bacteremia Tricuspid valve endocarditis, status post angio vac debridement on 7/5 Septic pulmonary emboli -Appreciate ID and thoracic surgery input -Last blood culture from 7/7 is clean -ID recommended total 6 weeks of antibiotics with iv vanc first 4-week ( from 7/5 to 8/2) , then transition to p.o. linezolid 600 mg twice daily for the last 2 weeks  -Please reengage ID around beginning of august to  reassess prior to discharge, ID would like to repeat echo at the end of abx therapy as well  Thoracic pain Negative MRI spine on 7/17  Prior hep C infection, cleared  Acute PE/single posterior left lower lobe branch segmental pulmonary PE -Identified on CTA on 7/19 -Started on Lovenox -Echocardiogram and lower extremity Doppler was done, reports still pending  will follow up on result  Normocytic anemia -In acute illness -Hemoglobin was normal in 2019, hemoglobin on presentation 12.6,  hemoglobin range from 8-9 in last few weeks -No overt bleeding -Monitor hemoglobin intermittently  Leukocytosis/thrombocytosis -Likely reactive -WBC improving Monitor platelet number  Hyponatremia Resolved   Polysubstance abuse/IVDU -He report in the past to use methadone, he would like to start methadone here in hospital, he understand you need to follow-up with methadone clinic immediately after discharge -Report he used to follow at methadone clinic across the street, he knows where to go -Start low-dose methadone and titrate, monitor QTC ( qtc 446 on 7/22)      Body mass index is 20.14 kg/m.8/22       Unresulted Labs (From admission, onward)     Start     Ordered   04/11/21 0500  C-reactive protein  Weekly,   R     Question:  Specimen collection method  Answer:  Lab=Lab collect   04/06/21 1321   03/29/21 1635  Acid Fast Culture with reflexed sensitivities  RELEASE UPON ORDERING,   TIMED       Comments: Specimen A: Phone #{PHONE 05/30/21 Immunocompromised?  No  Antibiotic Treatment:  zenacef 2 grams Is the patient on airborne/droplet precautions? No Clinical History:  Tricuspid valve vegitation Special Instructions:  none Specimen Disposition:  Microbiology     03/29/21 1635              DVT prophylaxis:   Was on prophylaxis Lovenox , on therapeutic Lovenox from 7/19   Code Status: Full Family Communication: Patient Disposition:   Status is: Inpatient  Dispo: The patient  is from: Home              Anticipated d/c is to: Home              Anticipated d/c date is: Early August after reevaluated and cleared by ID                Consultants:  ID Thoracic surgery   Procedures:  Angio VAC debridement of tricuspid valve vegetation  Antimicrobials:   Iv vanc     Objective: Vitals:   04/15/21 2218 04/16/21 0540 04/16/21 0900 04/16/21 1215  BP: (!) 108/57 97/65 104/63 (!) 98/51  Pulse: (!) 110 95 (!) 112 100  Resp: 17 16 17 18   Temp: 98.4  F (36.9 C) 98.1 F (36.7 C) 98.3 F (36.8 C) 97.8 F (36.6 C)  TempSrc: Oral Oral Oral Oral  SpO2: 94% 100% 100% 97%  Weight:      Height:        Intake/Output Summary (Last 24 hours) at 04/16/2021 1318 Last data filed at 04/16/2021 1200 Gross per 24 hour  Intake 731.32 ml  Output 1775 ml  Net -1043.68 ml   Filed Weights   03/25/21 0304 03/27/21 1917 04/12/21 0636  Weight: 72.6 kg 70.9 kg 67.4 kg    Examination:  General exam: calm, NAD Respiratory system: Clear to auscultation. Respiratory effort normal. Cardiovascular system: S1 & S2 heard, RRR. No pedal edema. Gastrointestinal system: Abdomen is nondistended, soft and nontender. No organomegaly or masses felt. Normal bowel sounds heard. Central nervous system: Alert and oriented. No focal neurological deficits. Extremities: Symmetric 5 x 5 power. Skin: No rashes, lesions or ulcers Psychiatry: Judgement and insight appear normal. Mood & affect appropriate.     Data Reviewed: I have personally reviewed following labs and imaging studies  CBC: Recent Labs  Lab 04/10/21 0820 04/11/21 0557 04/15/21 0153  WBC 10.0 11.7* 14.7*  NEUTROABS  --  8.5*  --   HGB 7.8* 8.7* 8.5*  HCT 23.9* 26.6* 26.0*  MCV 87.9 88.1 91.5  PLT 464* 514* 375    Basic Metabolic Panel: Recent Labs  Lab 04/11/21 0557 04/12/21 0610 04/15/21 0153  NA 137  --  133*  K 4.1  --  4.2  CL 104  --  99  CO2 27  --  27  GLUCOSE 120*  --  119*  BUN 17  --  18  CREATININE 0.65 0.64 0.67  CALCIUM 8.4*  --  8.7*  MG 1.8  --  2.0  PHOS 3.8  --  4.4    GFR: Estimated Creatinine Clearance: 126.4 mL/min (by C-G formula based on SCr of 0.67 mg/dL).  Liver Function Tests: Recent Labs  Lab 04/11/21 0557  AST 13*  ALT 18  ALKPHOS 93  BILITOT 0.5  PROT 7.1  ALBUMIN 2.0*    CBG: No results for input(s): GLUCAP in the last 168 hours.   No results found for this or any previous visit (from the past 240 hour(s)).       Radiology  Studies: No results found.      Scheduled Meds:  enoxaparin (LOVENOX) injection  70 mg Subcutaneous Q12H   feeding supplement  237 mL Oral BID BM   guaiFENesin  600 mg Oral BID   lidocaine  1 patch Transdermal Q24H   melatonin  3 mg Oral QHS   methadone  7.5 mg Oral BID   nicotine  21 mg Transdermal Daily   pantoprazole  40 mg Oral Daily  Continuous Infusions:  sodium chloride 10 mL/hr at 04/12/21 2148   vancomycin 1,250 mg (04/16/21 0924)     LOS: 22 days   Time spent: Greater than 50% of this time was spent in counseling, explanation of diagnosis, planning of further management, and coordination of care.   Voice Recognition Reubin [place] dictation system was used to create this note, attempts have been made to correct errors. Please contact the author with questions and/or clarifications.   Albertine Grates, MD PhD FACP [facility]  Available via Epic secure chat 7am-7pm for nonurgent issues Please page for urgent issues To page the attending provider between 7A-7P or the covering provider during after hours 7P-7A, please log into the web site [email or web address] and access using universal [place] password for that web site. If you do not have the password, please call the hospital operator.    04/16/2021, 1:18 PM

## 2021-04-16 NOTE — Plan of Care (Signed)
Pt is independent with all tasks. C/o pain chest and back; managed with oxycodone and ibuprofen prn. Per pt request 1 dose of PO ativan for anxiety. Remains ST 110-120s per baseline. Afebrile with plan for IV antibiotic. RA no dyspnea. Pt is on Lovenox for PE treatment. 1 BM for this shift. Adequate I/Os. PIV L FA s/l and intact. Independence encouraged. Call bell within reach.    Problem: Health Behavior/Discharge Planning: Goal: Ability to manage health-related needs will improve Outcome: Progressing   Problem: Clinical Measurements: Goal: Ability to maintain clinical measurements within normal limits will improve Outcome: Progressing Goal: Will remain free from infection Outcome: Progressing Goal: Diagnostic test results will improve Outcome: Progressing Goal: Respiratory complications will improve Outcome: Progressing Goal: Cardiovascular complication will be avoided Outcome: Progressing   Problem: Activity: Goal: Risk for activity intolerance will decrease Outcome: Progressing   Problem: Nutrition: Goal: Adequate nutrition will be maintained Outcome: Progressing   Problem: Coping: Goal: Level of anxiety will decrease Outcome: Progressing   Problem: Elimination: Goal: Will not experience complications related to bowel motility Outcome: Progressing Goal: Will not experience complications related to urinary retention Outcome: Progressing   Problem: Pain Managment: Goal: General experience of comfort will improve Outcome: Progressing   Problem: Safety: Goal: Ability to remain free from injury will improve Outcome: Progressing   Problem: Skin Integrity: Goal: Risk for impaired skin integrity will decrease Outcome: Progressing

## 2021-04-17 LAB — BASIC METABOLIC PANEL
Anion gap: 8 (ref 5–15)
BUN: 15 mg/dL (ref 6–20)
CO2: 27 mmol/L (ref 22–32)
Calcium: 8.8 mg/dL — ABNORMAL LOW (ref 8.9–10.3)
Chloride: 99 mmol/L (ref 98–111)
Creatinine, Ser: 0.7 mg/dL (ref 0.61–1.24)
GFR, Estimated: >60 mL/min (ref >60)
Glucose, Bld: 107 mg/dL — ABNORMAL HIGH (ref 70–99)
Potassium: 4.1 mmol/L (ref 3.5–5.1)
Sodium: 134 mmol/L — ABNORMAL LOW (ref 135–145)

## 2021-04-17 LAB — CBC
HCT: 26.6 % — ABNORMAL LOW (ref 39.0–52.0)
Hemoglobin: 8.8 g/dL — ABNORMAL LOW (ref 13.0–17.0)
MCH: 29.8 pg (ref 26.0–34.0)
MCHC: 33.1 g/dL (ref 30.0–36.0)
MCV: 90.2 fL (ref 80.0–100.0)
Platelets: 339 10*3/uL (ref 150–400)
RBC: 2.95 MIL/uL — ABNORMAL LOW (ref 4.22–5.81)
RDW: 17.3 % — ABNORMAL HIGH (ref 11.5–15.5)
WBC: 12.5 10*3/uL — ABNORMAL HIGH (ref 4.0–10.5)
nRBC: 0 % (ref 0.0–0.2)

## 2021-04-17 MED ORDER — PROPRANOLOL HCL 10 MG PO TABS
10.0000 mg | ORAL_TABLET | Freq: Two times a day (BID) | ORAL | Status: DC
  Administered 2021-04-17 – 2021-04-18 (×3): 10 mg via ORAL
  Filled 2021-04-17 (×6): qty 1

## 2021-04-17 MED ORDER — SODIUM CHLORIDE 0.9 % IV SOLN
INTRAVENOUS | Status: AC
  Administered 2021-04-17: 75 mL/h via INTRAVENOUS

## 2021-04-17 MED ORDER — METHADONE HCL 10 MG PO TABS
10.0000 mg | ORAL_TABLET | Freq: Two times a day (BID) | ORAL | Status: DC
  Administered 2021-04-17 – 2021-04-20 (×6): 10 mg via ORAL
  Filled 2021-04-17 (×6): qty 1

## 2021-04-17 MED ORDER — METOPROLOL TARTRATE 5 MG/5ML IV SOLN: 2.5000 mg | Freq: Four times a day (QID) | INTRAVENOUS | Status: DC | PRN

## 2021-04-17 MED ORDER — MAGNESIUM SULFATE 2 GM/50ML IV SOLN
2.0000 g | Freq: Once | INTRAVENOUS | Status: AC
  Administered 2021-04-17: 2 g via INTRAVENOUS
  Filled 2021-04-17: qty 50

## 2021-04-17 NOTE — Progress Notes (Signed)
PROGRESS NOTE    Duane Price  [id number]:[id number] DOB: 10/23/1985 DOA: 03/25/2021 PCP: Patient, No Pcp Per (Inactive)    Chief Complaint  Patient presents with   Chest Injury    Brief Narrative:   History of IVDU with daily heroin, found to have MRSA bacteremia, septic pulmonary emboli , tricuspid valve vegetation status post angio Vac debridement  He is on IV antibiotics He is also started on anticoagulation for acute PE , diagnosed on 7/19 Need to reconsult ID on 8/1  Subjective:  He has Sinus tachycardia, sometimes in the 130's, He is on room air, no fever,  He does not drink much fluids at all,  He also reports being anxious Reports chest pain and back pain has slowly getting better with adjustment of pain regimen  occasional cough with pleuritic chest pain Report methadone and oxycodone 20mg   prn is helping  Having bm  Assessment & Plan:   Principal Problem:   MRSA bacteremia Active Problems:   Septic embolism (HCC)   Normocytic anemia   IV drug abuse (HCC)   Endocarditis of tricuspid valve   HCV antibody positive   MRSA bacteremia Tricuspid valve endocarditis, status post angio vac debridement on 7/5 Septic pulmonary emboli -Appreciate ID and thoracic surgery input -Last blood culture from 7/7 is clean -ID recommended total 6 weeks of antibiotics with iv vanc first 4-week ( from 7/5 to 8/2) , then transition to p.o. linezolid 600 mg twice daily for the last 2 weeks   -ID would like to repeat echo , order placed to repeat echo on 8/2-Please reengage ID on 8/1 to  reassess prior to discharge.  Thoracic pain Negative MRI spine on 7/17  Prior hep C infection, cleared  Acute PE/single posterior left lower lobe branch segmental pulmonary PE -Identified on CTA on 7/19 -Started on Lovenox -lower extremity Doppler was done on 7/20, reports still pending , he does not have lower extremity edema on exam -echocardiogram on 7/20 showed "1.2 x 1 cm mobile vegetation on  tricuspid valve, still significant but  smaller than prior"    Normocytic anemia, In acute illness -Hemoglobin was normal in 2019, hemoglobin on presentation 12.6, hemoglobin range from 8-9 in last few weeks -No overt bleeding -Monitor hemoglobin intermittently  Leukocytosis/thrombocytosis -Likely reactive -WBC improving, plt normalized    Hyponatremia Improved, he does not drink enough fluids, encourage fluids intake, on iv hydration intermittently, most recently started on 7/24  Sinus tachycardia -reports being anxious, also appear dehydrated -started on  propranolol, on prn hydroxyzine, restarted hydration on 7/24   Polysubstance abuse/IVDU -He reports followed with  methadone clinic in the past , he would like to start methadone here in hospital, he understand the need to follow-up with methadone clinic immediately after discharge -Report he used to follow at methadone clinic across the street, he knows where to go -Start low-dose methadone and titrate, monitor QTC ( qtc 446 on 7/22) -he still need prn oxycodone quit frequently, gradually increase methadone     Body mass index is 20.14 kg/m.8/22       Unresulted Labs (From admission, onward)     Start     Ordered   04/18/21 2030  Vancomycin, trough  Once-Timed,   TIMED       Question:  Specimen collection method  Answer:  Lab=Lab collect  See Hyperspace for full Linked Orders Report.   04/17/21 1529   04/18/21 1130  Vancomycin, peak  Once-Timed,   TIMED  Question:  Specimen collection method  Answer:  Lab=Lab collect  See Hyperspace for full Linked Orders Report.   04/17/21 1529   04/17/21 0000  CBC  Every 72 hours,   R     Question:  Specimen collection method  Answer:  Lab=Lab collect   04/16/21 1319   04/17/21 0000  Basic metabolic panel  Every 72 hours,   R     Question:  Specimen collection method  Answer:  Lab=Lab collect   04/16/21 1320   04/11/21 0500  C-reactive protein  Weekly,   R (with TIMED  occurrences)     Question:  Specimen collection method  Answer:  Lab=Lab collect   04/06/21 1321   03/29/21 1635  Acid Fast Culture with reflexed sensitivities  RELEASE UPON ORDERING,   TIMED       Comments: Specimen A: Phone #{PHONE #:[phone] Immunocompromised?  No  Antibiotic Treatment:  zenacef 2 grams Is the patient on airborne/droplet precautions? No Clinical History:  Tricuspid valve vegitation Special Instructions:  none Specimen Disposition:  Microbiology     03/29/21 1635              DVT prophylaxis:   Was on prophylaxis Lovenox , started on therapeutic Lovenox from 7/19   Code Status: Full Family Communication: Patient Disposition:   Status is: Inpatient  Dispo: The patient is from: Home              Anticipated d/c is to: Home              Anticipated d/c date is: Early August after reevaluated and cleared by ID                Consultants:  ID Thoracic surgery   Procedures:  Angio VAC debridement of tricuspid valve vegetation  Antimicrobials:   Iv vanc     Objective: Vitals:   04/17/21 0439 04/17/21 0900 04/17/21 1141 04/17/21 1600  BP: (!) 100/58 113/68 103/61 101/65  Pulse:  (!) 108 (!) 111   Resp:  19  18  Temp: 98.2 F (36.8 C) 98.4 F (36.9 C)  98.5 F (36.9 C)  TempSrc: Oral Oral  Oral  SpO2:  98%  97%  Weight:      Height:        Intake/Output Summary (Last 24 hours) at 04/17/2021 1915 Last data filed at 04/17/2021 1800 Gross per 24 hour  Intake 258 ml  Output 1925 ml  Net -1667 ml   Filed Weights   03/25/21 0304 03/27/21 1917 04/12/21 0636  Weight: 72.6 kg 70.9 kg 67.4 kg    Examination:  General exam: calm, NAD Respiratory system: Clear to auscultation. Respiratory effort normal. Cardiovascular system: Sinus tachycardia, No pedal edema. Gastrointestinal system: Abdomen is nondistended, soft and nontender. Normal bowel sounds heard. Central nervous system: Alert and oriented. No focal neurological  deficits. Extremities: Symmetric 5 x 5 power. Skin: No rashes, lesions or ulcers Psychiatry: Judgement and insight appear normal. Mood & affect appropriate.     Data Reviewed: I have personally reviewed following labs and imaging studies  CBC: Recent Labs  Lab 04/11/21 0557 04/15/21 0153 04/17/21 0239  WBC 11.7* 14.7* 12.5*  NEUTROABS 8.5*  --   --   HGB 8.7* 8.5* 8.8*  HCT 26.6* 26.0* 26.6*  MCV 88.1 91.5 90.2  PLT 514* 375 339    Basic Metabolic Panel: Recent Labs  Lab 04/11/21 0557 04/12/21 0610 04/15/21 0153 04/17/21 0239  NA 137  --  133*  134*  K 4.1  --  4.2 4.1  CL 104  --  99 99  CO2 27  --  27 27  GLUCOSE 120*  --  119* 107*  BUN 17  --  18 15  CREATININE 0.65 0.64 0.67 0.70  CALCIUM 8.4*  --  8.7* 8.8*  MG 1.8  --  2.0  --   PHOS 3.8  --  4.4  --     GFR: Estimated Creatinine Clearance: 126.4 mL/min (by C-G formula based on SCr of 0.7 mg/dL).  Liver Function Tests: Recent Labs  Lab 04/11/21 0557  AST 13*  ALT 18  ALKPHOS 93  BILITOT 0.5  PROT 7.1  ALBUMIN 2.0*    CBG: No results for input(s): GLUCAP in the last 168 hours.   No results found for this or any previous visit (from the past 240 hour(s)).       Radiology Studies: No results found.      Scheduled Meds:  enoxaparin (LOVENOX) injection  70 mg Subcutaneous Q12H   feeding supplement  237 mL Oral BID BM   guaiFENesin  600 mg Oral BID   lidocaine  1 patch Transdermal Q24H   melatonin  3 mg Oral QHS   methadone  10 mg Oral BID   nicotine  21 mg Transdermal Daily   pantoprazole  40 mg Oral Daily   propranolol  10 mg Oral BID   Continuous Infusions:  sodium chloride Stopped (04/17/21 1106)   sodium chloride 75 mL/hr (04/17/21 1712)   vancomycin Stopped (04/17/21 1030)     LOS: 23 days   Time spent: Greater than 50% of this time was spent in counseling, explanation of diagnosis, planning of further management, and coordination of care.   Voice  Recognition Reubin [place] dictation system was used to create this note, attempts have been made to correct errors. Please contact the author with questions and/or clarifications.   Albertine Grates, MD PhD FACP [facility]  Available via Epic secure chat 7am-7pm for nonurgent issues Please page for urgent issues To page the attending provider between 7A-7P or the covering provider during after hours 7P-7A, please log into the web site [email or web address] and access using universal [place] password for that web site. If you do not have the password, please call the hospital operator.    04/17/2021, 7:15 PM

## 2021-04-17 NOTE — Plan of Care (Signed)
Propanolol added for ST 160s. Encourage PO intake of fluids. Maintenance IV NS @ 75 cc/hr added. PRN metoprolol IV for sustained tachycardia. Mag IV repleted. PRN oxy and ibuprofen given.   Problem: Health Behavior/Discharge Planning: Goal: Ability to manage health-related needs will improve Outcome: Progressing   Problem: Clinical Measurements: Goal: Ability to maintain clinical measurements within normal limits will improve Outcome: Progressing Goal: Will remain free from infection Outcome: Progressing Goal: Diagnostic test results will improve Outcome: Progressing Goal: Respiratory complications will improve Outcome: Progressing Goal: Cardiovascular complication will be avoided Outcome: Progressing   Problem: Activity: Goal: Risk for activity intolerance will decrease Outcome: Progressing   Problem: Nutrition: Goal: Adequate nutrition will be maintained Outcome: Progressing   Problem: Coping: Goal: Level of anxiety will decrease Outcome: Progressing   Problem: Elimination: Goal: Will not experience complications related to bowel motility Outcome: Progressing Goal: Will not experience complications related to urinary retention Outcome: Progressing   Problem: Pain Managment: Goal: General experience of comfort will improve Outcome: Progressing   Problem: Safety: Goal: Ability to remain free from injury will improve Outcome: Progressing   Problem: Skin Integrity: Goal: Risk for impaired skin integrity will decrease Outcome: Progressing

## 2021-04-18 LAB — VANCOMYCIN, PEAK: Vancomycin Pk: 18 ug/mL — ABNORMAL LOW (ref 30–40)

## 2021-04-18 LAB — C-REACTIVE PROTEIN: CRP: 3.5 mg/dL — ABNORMAL HIGH (ref <1.0)

## 2021-04-18 NOTE — Progress Notes (Signed)
PROGRESS NOTE    Duane Price  CWC:[id number] DOB: 09/12/1986 DOA: 03/25/2021 PCP: Patient, No Pcp Per (Inactive)    Chief Complaint  Patient presents with   Chest Injury    Brief Narrative:  History of IVDU with daily heroin, found to have MRSA bacteremia, septic pulmonary emboli , tricuspid valve vegetation status post angio Vac debridement He is on IV antibiotics He is also started on anticoagulation for acute PE , diagnosed on 7/19 Need to reconsult ID on 8/1  Assessment & Plan:   Principal Problem:   MRSA bacteremia Active Problems:   Septic embolism (HCC)   Normocytic anemia   IV drug abuse (HCC)   Endocarditis of tricuspid valve   HCV antibody positive   MRSA bacteremia Tricuspid valve endocarditis s/p angio vac debridement on 03/29/2021 with associated septic pulmonary emboli. Infectious disease recommended total of 6 weeks of IV antibiotics,. IV vancomycin to complete on 04/26/2021 and transition to linezolid 600 mg twice daily for the last 2 weeks. Plan for repeat echocardiogram on 04/26/2021.    Acute PE/ On Lovenox. Echocardiogram reviewed with the patient.   Anemia of acute illness Normocytic anemia Transfuse to keep hemoglobin greater than 7.   History of a hepatitis C Outpatient follow-up with ID.    History of polysubstance abuse/IV drug abuse  Recommend outpatient follow-up with methadone clinic on discharge.    Mild hyponatremia Improved    Sinus tachycardia Probably secondary to the disease process.    DVT prophylaxis: (Lovenox/) Code Status: (Full code) Family Communication: none at bedside.  Disposition:   Status is: Inpatient  Remains inpatient appropriate because:IV treatments appropriate due to intensity of illness or inability to take PO  Dispo: The patient is from: Home              Anticipated d/c is to: Home              Patient currently is not medically stable to d/c.   Difficult to place patient No        Consultants:  ID   Procedures: none.   Antimicrobials:  Antibiotics Given (last 72 hours)     Date/Time Action Medication Dose Rate   04/15/21 2137 New Bag/Given   vancomycin (VANCOREADY) IVPB 1250 mg/250 mL 1,250 mg 166.7 mL/hr   04/16/21 0924 New Bag/Given   vancomycin (VANCOREADY) IVPB 1250 mg/250 mL 1,250 mg 166.7 mL/hr   04/16/21 2226 New Bag/Given   vancomycin (VANCOREADY) IVPB 1250 mg/250 mL 1,250 mg 166.7 mL/hr   04/17/21 0850 New Bag/Given   vancomycin (VANCOREADY) IVPB 1250 mg/250 mL 1,250 mg 166.7 mL/hr   04/17/21 2011 New Bag/Given   vancomycin (VANCOREADY) IVPB 1250 mg/250 mL 1,250 mg 166.7 mL/hr   04/18/21 0906 New Bag/Given   vancomycin (VANCOREADY) IVPB 1250 mg/250 mL 1,250 mg 166.7 mL/hr         Subjective: Reports felt feverish and had chills.   Objective: Vitals:   04/18/21 0019 04/18/21 0515 04/18/21 0756 04/18/21 1239  BP: 106/61 (!) 101/58 101/60 103/64  Pulse: (!) 102  95 100  Resp: 20 18 20 18   Temp: 98.2 F (36.8 C) 98.6 F (37 C) 98.7 F (37.1 C)   TempSrc: Oral Oral Oral   SpO2:   99% 99%  Weight:      Height:        Intake/Output Summary (Last 24 hours) at 04/18/2021 1345 Last data filed at 04/18/2021 1231 Gross per 24 hour  Intake 945.38 ml  Output 3100 ml  Net -2154.62 ml   Filed Weights   03/25/21 0304 03/27/21 1917 04/12/21 0636  Weight: 72.6 kg 70.9 kg 67.4 kg    Examination:  General exam: Appears calm and comfortable  Respiratory system: Clear to auscultation. Respiratory effort normal. Cardiovascular system: S1 & S2 heard, RRR. No JVD,  No pedal edema. Gastrointestinal system: Abdomen is nondistended, soft and nontender.  Normal bowel sounds heard. Central nervous system: Alert and oriented. No focal neurological deficits. Extremities: Symmetric 5 x 5 power. Skin: No rashes, lesions or ulcers Psychiatry: Mood & affect appropriate.     Data Reviewed: I have personally reviewed following labs and imaging  studies  CBC: Recent Labs  Lab 04/15/21 0153 04/17/21 0239  WBC 14.7* 12.5*  HGB 8.5* 8.8*  HCT 26.0* 26.6*  MCV 91.5 90.2  PLT 375 339    Basic Metabolic Panel: Recent Labs  Lab 04/12/21 0610 04/15/21 0153 04/17/21 0239  NA  --  133* 134*  K  --  4.2 4.1  CL  --  99 99  CO2  --  27 27  GLUCOSE  --  119* 107*  BUN  --  18 15  CREATININE 0.64 0.67 0.70  CALCIUM  --  8.7* 8.8*  MG  --  2.0  --   PHOS  --  4.4  --     GFR: Estimated Creatinine Clearance: 126.4 mL/min (by C-G formula based on SCr of 0.7 mg/dL).  Liver Function Tests: No results for input(s): AST, ALT, ALKPHOS, BILITOT, PROT, ALBUMIN in the last 168 hours.  CBG: No results for input(s): GLUCAP in the last 168 hours.   No results found for this or any previous visit (from the past 240 hour(s)).       Radiology Studies: No results found.      Scheduled Meds:  enoxaparin (LOVENOX) injection  70 mg Subcutaneous Q12H   feeding supplement  237 mL Oral BID BM   guaiFENesin  600 mg Oral BID   lidocaine  1 patch Transdermal Q24H   melatonin  3 mg Oral QHS   methadone  10 mg Oral BID   nicotine  21 mg Transdermal Daily   pantoprazole  40 mg Oral Daily   propranolol  10 mg Oral BID   Continuous Infusions:  sodium chloride Stopped (04/17/21 1106)   sodium chloride 75 mL/hr at 04/18/21 1128   vancomycin 1,250 mg (04/18/21 0906)     LOS: 24 days        Kathlen Mody, MD [facility]   To contact the attending provider between 7A-7P or the covering provider during after hours 7P-7A, please log into the web site [email or web address] and access using universal [place] password for that web site. If you do not have the password, please call the hospital operator.  04/18/2021, 1:45 PM

## 2021-04-18 NOTE — Progress Notes (Signed)
Pharmacy Consult for Vancomycin   Ref. Range 04/18/2021 14:44  Vancomycin Pk Latest Ref Range: 30 - 40 ug/mL 18 (L)   Dose was given on 7/25@0900  - level drawn 4 hours after the end of the infusion - makes difficult for interpretation given late draw so will retime levels for tonight and tomorrow morning.   Sherron Monday, PharmD, BCCCP Clinical Pharmacist  Phone: [phone] 04/18/2021 4:50 PM  Please check AMION for all [facility] Pharmacy phone numbers After 10:00 PM, call Main Pharmacy [phone]

## 2021-04-19 ENCOUNTER — Inpatient Hospital Stay (HOSPITAL_COMMUNITY): Payer: Self-pay

## 2021-04-19 LAB — CBC WITH DIFFERENTIAL/PLATELET
Abs Immature Granulocytes: 0.04 10*3/uL (ref 0.00–0.07)
Basophils Absolute: 0 10*3/uL (ref 0.0–0.1)
Basophils Relative: 0 %
Eosinophils Absolute: 0 10*3/uL (ref 0.0–0.5)
Eosinophils Relative: 0 %
HCT: 28 % — ABNORMAL LOW (ref 39.0–52.0)
Hemoglobin: 9 g/dL — ABNORMAL LOW (ref 13.0–17.0)
Immature Granulocytes: 1 %
Lymphocytes Relative: 31 %
Lymphs Abs: 2.3 10*3/uL (ref 0.7–4.0)
MCH: 29.3 pg (ref 26.0–34.0)
MCHC: 32.1 g/dL (ref 30.0–36.0)
MCV: 91.2 fL (ref 80.0–100.0)
Monocytes Absolute: 0.7 10*3/uL (ref 0.1–1.0)
Monocytes Relative: 9 %
Neutro Abs: 4.3 10*3/uL (ref 1.7–7.7)
Neutrophils Relative %: 59 %
Platelets: 321 10*3/uL (ref 150–400)
RBC: 3.07 MIL/uL — ABNORMAL LOW (ref 4.22–5.81)
RDW: 17.3 % — ABNORMAL HIGH (ref 11.5–15.5)
WBC: 7.3 10*3/uL (ref 4.0–10.5)
nRBC: 0 % (ref 0.0–0.2)

## 2021-04-19 LAB — BASIC METABOLIC PANEL
Anion gap: 6 (ref 5–15)
BUN: 14 mg/dL (ref 6–20)
CO2: 29 mmol/L (ref 22–32)
Calcium: 8.6 mg/dL — ABNORMAL LOW (ref 8.9–10.3)
Chloride: 97 mmol/L — ABNORMAL LOW (ref 98–111)
Creatinine, Ser: 0.85 mg/dL (ref 0.61–1.24)
GFR, Estimated: >60 mL/min (ref >60)
Glucose, Bld: 108 mg/dL — ABNORMAL HIGH (ref 70–99)
Potassium: 3.9 mmol/L (ref 3.5–5.1)
Sodium: 132 mmol/L — ABNORMAL LOW (ref 135–145)

## 2021-04-19 LAB — URINALYSIS, ROUTINE W REFLEX MICROSCOPIC
Bilirubin Urine: NEGATIVE
Glucose, UA: NEGATIVE mg/dL
Ketones, ur: NEGATIVE mg/dL
Leukocytes,Ua: NEGATIVE
Nitrite: NEGATIVE
Protein, ur: 30 mg/dL — AB
Specific Gravity, Urine: 1.021 (ref 1.005–1.030)
pH: 5 (ref 5.0–8.0)

## 2021-04-19 LAB — VANCOMYCIN, TROUGH: Vancomycin Tr: 11 ug/mL — ABNORMAL LOW (ref 15–20)

## 2021-04-19 LAB — VANCOMYCIN, PEAK: Vancomycin Pk: 52 ug/mL (ref 30–40)

## 2021-04-19 IMAGING — DX DG CHEST 1V PORT
1 series · 1 of 1 positions shown · non-contrast
Comparison: CT [DATE].  Chest x-ray [DATE].

CLINICAL DATA: Cough.  Shortness of breath.

EXAM:
PORTABLE CHEST 1 VIEW

[chest ap]
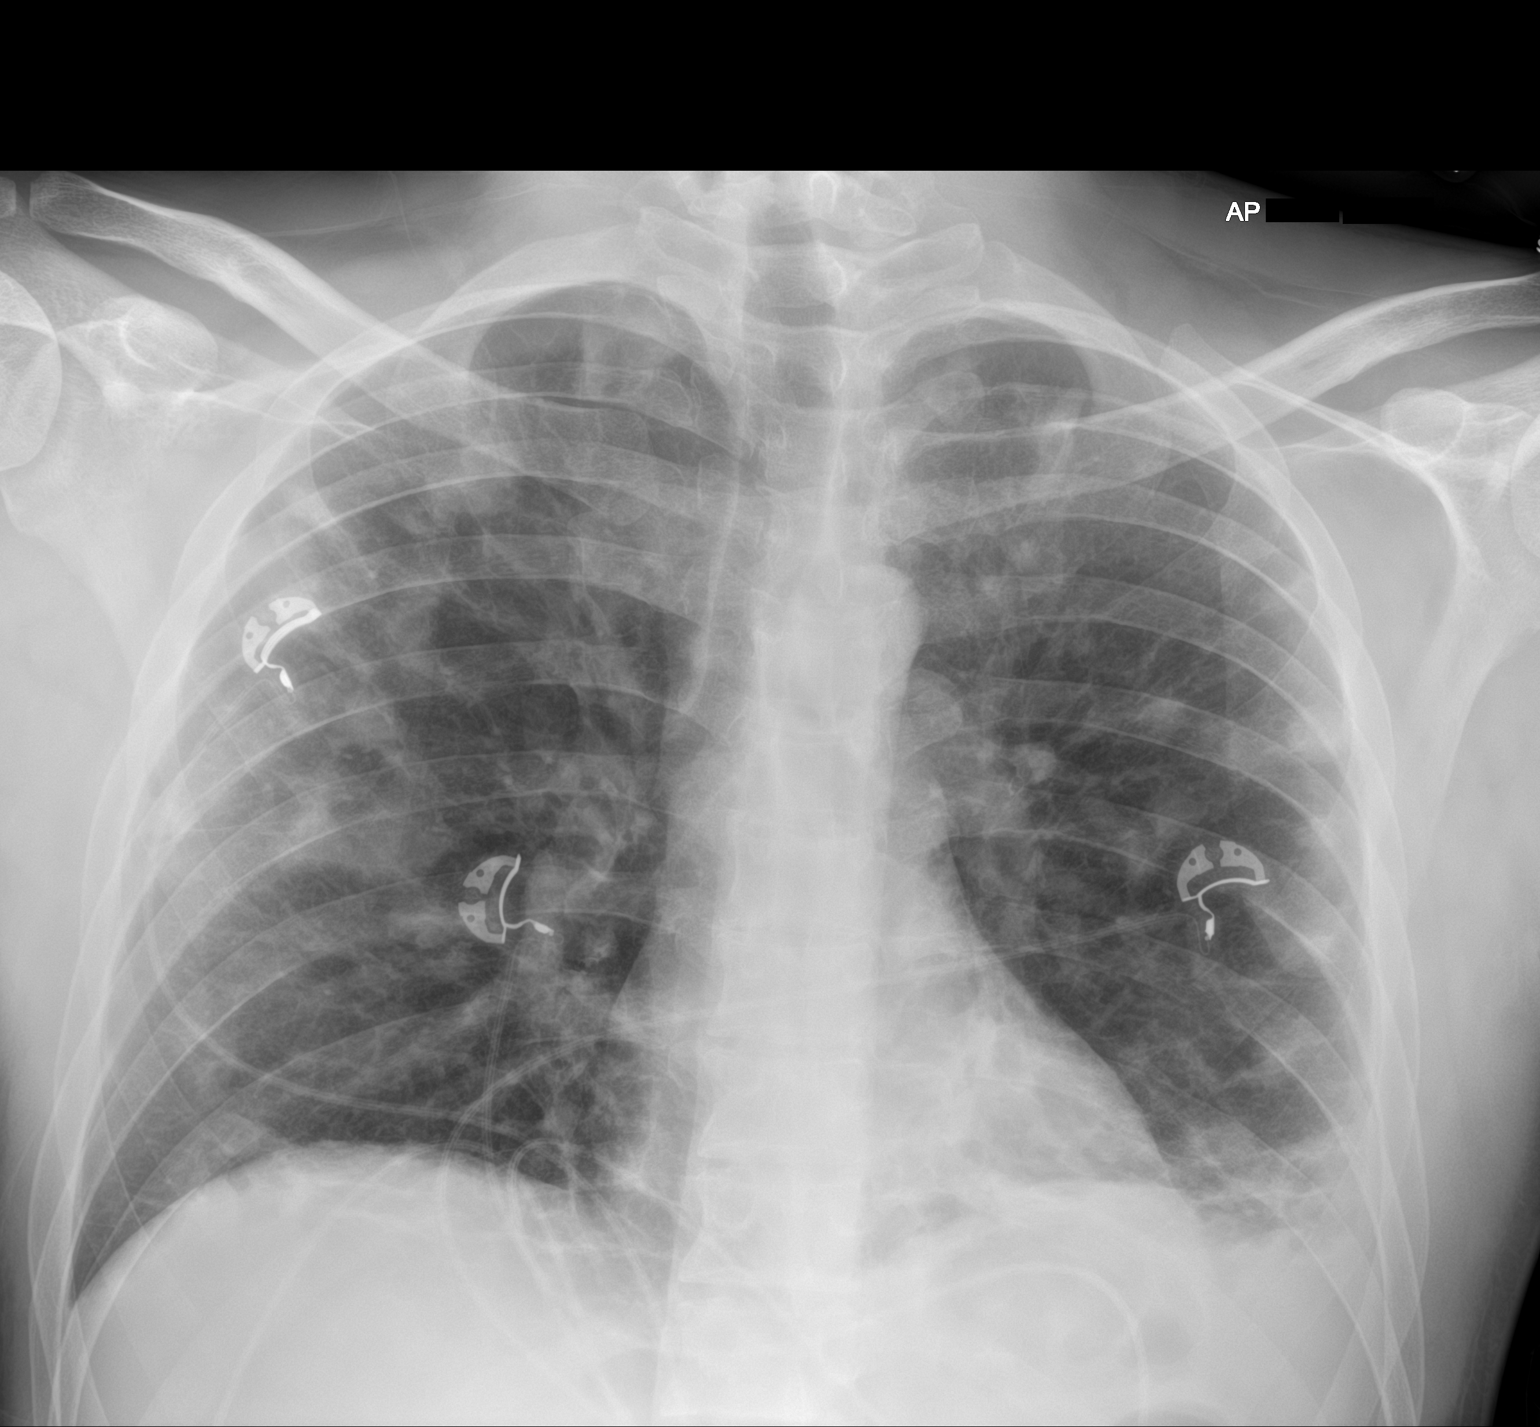

[1 of 1 positions shown; findings below may reference images not displayed]

FINDINGS: Interval removal of left IJ line. Mediastinum hilar structures
normal. Heart size normal. Persistent bilateral nodular opacities
are again noted throughout both lung fields. Again septic emboli
could present this fashion. Other etiologies of pulmonary nodular
lesions cannot be excluded. Low lung volumes with bibasilar
atelectasis. Improved aeration from prior exam. Small left pleural
effusion. No pneumothorax.
IMPRESSION: 1. Persistent bilateral nodular opacities are again noted throughout
both lung fields. Again septic emboli could present this fashion.
Other etiologies pulmonary nodular lesions cannot be excluded.

2. Low lung volumes with bibasilar atelectasis. Improved aeration
from prior exam. Small left pleural effusion.

## 2021-04-19 MED ORDER — VANCOMYCIN HCL IN DEXTROSE 1-5 GM/200ML-% IV SOLN
1000.0000 mg | Freq: Two times a day (BID) | INTRAVENOUS | Status: DC
Start: 2021-04-19 — End: 2021-04-25
  Administered 2021-04-19 – 2021-04-25 (×13): 1000 mg via INTRAVENOUS
  Filled 2021-04-19 (×15): qty 200

## 2021-04-19 MED ORDER — GUAIFENESIN-DM 100-10 MG/5ML PO SYRP
10.0000 mL | ORAL_SOLUTION | ORAL | Status: DC | PRN
  Administered 2021-04-22: 10 mL via ORAL
  Filled 2021-04-19: qty 10

## 2021-04-19 NOTE — Progress Notes (Signed)
   04/19/21 0418  Assess: MEWS Score  Temp (!) 103.5 F (39.7 C)  BP 103/66  Pulse Rate (!) 114  ECG Heart Rate (!) 114  Resp 18  SpO2 99 %  O2 Device Room Air  Assess: MEWS Score  MEWS Temp 2  MEWS Systolic 0  MEWS Pulse 2  MEWS RR 0  MEWS LOC 0  MEWS Score 4  MEWS Score Color Red  Assess: if the MEWS score is Yellow or Red  Were vital signs taken at a resting state? Yes  Focused Assessment No change from prior assessment  Early Detection of Sepsis Score *See Row Information* Low  MEWS guidelines implemented *See Row Information* Yes  Treat  MEWS Interventions Administered prn meds/treatments  Take Vital Signs  Increase Vital Sign Frequency  Red: Q 1hr X 4 then Q 4hr X 4, if remains red, continue Q 4hrs  Escalate  MEWS: Escalate Red: discuss with charge nurse/RN and provider, consider discussing with RRT  Notify: Charge Nurse/RN  Name of Charge Nurse/RN Notified Heather, RN  Date Charge Nurse/RN Notified 04/19/21  Time Charge Nurse/RN Notified 0445  Notify: Provider  Provider Name/Title Dr. Loney Loh  Date Provider Notified 04/19/21  Time Provider Notified [phone]  Notification Type Page  Notification Reason Other (Comment) (Pt febrile and coughing up sputum)  Provider response No new orders  Date of Provider Response  (Waiting for response)  Time of Provider Response  (Waiting for response)  Document  Patient Outcome Stabilized after interventions  Progress note created (see row info) Yes  Assess: SIRS CRITERIA  SIRS Temperature  1  SIRS Pulse 1  SIRS Respirations  0  SIRS WBC 0  SIRS Score Sum  2

## 2021-04-19 NOTE — Progress Notes (Signed)
PROGRESS NOTE    Duane Price  NUU:[id number] DOB: Oct 15, 1985 DOA: 03/25/2021 PCP: Patient, No Pcp Per (Inactive)    Chief Complaint  Patient presents with   Chest Injury    Brief Narrative:  History of IVDU with daily heroin, found to have MRSA bacteremia, septic pulmonary emboli , tricuspid valve vegetation status post angio Vac debridement He is on IV antibiotics He is also started on anticoagulation for acute PE , diagnosed on 7/19 Need to reconsult ID on 8/1 Overnight pt became febrile, blood cultures , UA and CXR ordered. Pt reports worsening expectoration of sputum.   Assessment & Plan:   Principal Problem:   MRSA bacteremia Active Problems:   Septic embolism (HCC)   Normocytic anemia   IV drug abuse (HCC)   Endocarditis of tricuspid valve   HCV antibody positive   MRSA bacteremia Tricuspid valve endocarditis s/p angio vac debridement on 03/29/2021 with associated septic pulmonary emboli. Infectious disease recommended total of 6 weeks of IV antibiotics,. IV vancomycin to complete on 04/26/2021 and transition to linezolid 600 mg twice daily for the last 2 weeks. Plan for repeat echocardiogram on 04/26/2021. Overnight pt became febrile, blood cultures , UA and CXR ordered. Pt reports worsening expectoration of sputum. Sputum cultures ordered.     Acute PE/ On Lovenox. Echocardiogram reviewed with the patient.   Anemia of acute illness Normocytic anemia Transfuse to keep hemoglobin greater than 7.   History of a hepatitis C Outpatient follow-up with ID.    History of polysubstance abuse/IV drug abuse  Recommend outpatient follow-up with methadone clinic on discharge.    Mild hyponatremia Improved    Sinus tachycardia Probably secondary to the disease process.    DVT prophylaxis: (Lovenox/) Code Status: (Full code) Family Communication: none at bedside.  Disposition:   Status is: Inpatient  Remains inpatient appropriate because:IV treatments  appropriate due to intensity of illness or inability to take PO  Dispo: The patient is from: Home              Anticipated d/c is to: Home              Patient currently is not medically stable to d/c.   Difficult to place patient No       Consultants:  ID   Procedures: none.   Antimicrobials:  Antibiotics Given (last 72 hours)     Date/Time Action Medication Dose Rate   04/16/21 2226 New Bag/Given   vancomycin (VANCOREADY) IVPB 1250 mg/250 mL 1,250 mg 166.7 mL/hr   04/17/21 0850 New Bag/Given   vancomycin (VANCOREADY) IVPB 1250 mg/250 mL 1,250 mg 166.7 mL/hr   04/17/21 2011 New Bag/Given   vancomycin (VANCOREADY) IVPB 1250 mg/250 mL 1,250 mg 166.7 mL/hr   04/18/21 0906 New Bag/Given   vancomycin (VANCOREADY) IVPB 1250 mg/250 mL 1,250 mg 166.7 mL/hr   04/18/21 2145 New Bag/Given   vancomycin (VANCOREADY) IVPB 1250 mg/250 mL 1,250 mg 166.7 mL/hr   04/19/21 1111 New Bag/Given   vancomycin (VANCOCIN) IVPB 1000 mg/200 mL premix 1,000 mg 200 mL/hr         Subjective: Fever resolved this am. He reports feeling better, but more cough. Changed to robitussin   Objective: Vitals:   04/19/21 0802 04/19/21 0849 04/19/21 1150 04/19/21 1621  BP: (!) 87/61 97/61 (!) 104/59 119/64  Pulse: 98  99 (!) 109  Resp: 18  14 16   Temp: 98.7 F (37.1 C)  98 F (36.7 C) 97.9 F (36.6 C)  TempSrc: Oral  Axillary Axillary  SpO2: 99%     Weight:      Height:        Intake/Output Summary (Last 24 hours) at 04/19/2021 1637 Last data filed at 04/19/2021 1545 Gross per 24 hour  Intake 739.73 ml  Output 1775 ml  Net -1035.27 ml    Filed Weights   03/25/21 0304 03/27/21 1917 04/12/21 0636  Weight: 72.6 kg 70.9 kg 67.4 kg    Examination:  General exam: well developed, not in distress.  Respiratory system: air entry fair, not in distress.  Cardiovascular system: S1 & S2 heard, RRR, no JVD, no pedal edema.  Gastrointestinal system: Abdomen is soft, NT ND BS+ Central nervous system:  Alert AND ORIENTED,  non focal  Extremities: No pedal edema.  Skin: No rashes seen.  Psychiatry: Mood is appropriate.     Data Reviewed: I have personally reviewed following labs and imaging studies  CBC: Recent Labs  Lab 04/15/21 0153 04/17/21 0239 04/19/21 0828  WBC 14.7* 12.5* 7.3  NEUTROABS  --   --  4.3  HGB 8.5* 8.8* 9.0*  HCT 26.0* 26.6* 28.0*  MCV 91.5 90.2 91.2  PLT 375 339 321     Basic Metabolic Panel: Recent Labs  Lab 04/15/21 0153 04/17/21 0239 04/19/21 0828  NA 133* 134* 132*  K 4.2 4.1 3.9  CL 99 99 97*  CO2 27 27 29   GLUCOSE 119* 107* 108*  BUN 18 15 14   CREATININE 0.67 0.70 0.85  CALCIUM 8.7* 8.8* 8.6*  MG 2.0  --   --   PHOS 4.4  --   --      GFR: Estimated Creatinine Clearance: 118.9 mL/min (by C-G formula based on SCr of 0.85 mg/dL).  Liver Function Tests: No results for input(s): AST, ALT, ALKPHOS, BILITOT, PROT, ALBUMIN in the last 168 hours.  CBG: No results for input(s): GLUCAP in the last 168 hours.   Recent Results (from the past 240 hour(s))  Culture, blood (routine x 2)     Status: None (Preliminary result)   Collection Time: 04/19/21  6:40 AM   Specimen: BLOOD RIGHT HAND  Result Value Ref Range Status   Specimen Description BLOOD RIGHT HAND  Final   Special Requests   Final    BOTTLES DRAWN AEROBIC AND ANAEROBIC Blood Culture adequate volume   Culture   Final    NO GROWTH < 12 HOURS Performed at [facility], [address] [place], [address] [place]    Report Status PENDING  Incomplete  Culture, blood (routine x 2)     Status: None (Preliminary result)   Collection Time: 04/19/21  6:41 AM   Specimen: BLOOD LEFT HAND  Result Value Ref Range Status   Specimen Description BLOOD LEFT HAND  Final   Special Requests   Final    BOTTLES DRAWN AEROBIC AND ANAEROBIC Blood Culture adequate volume   Culture   Final    NO GROWTH < 12 HOURS Performed at [facility], [address] [place], [address] [place]     Report Status PENDING  Incomplete         Radiology Studies: DG CHEST PORT 1 VIEW  Result Date: 04/19/2021 CLINICAL DATA:  Cough.  Shortness of breath. EXAM: PORTABLE CHEST 1 VIEW COMPARISON:  CT 04/12/2021.  Chest x-ray 03/31/2021. FINDINGS: Interval removal of left IJ line. Mediastinum hilar structures normal. Heart size normal. Persistent bilateral nodular opacities are again noted throughout both lung fields. Again septic emboli  could present this fashion. Other etiologies of pulmonary nodular lesions cannot be excluded. Low lung volumes with bibasilar atelectasis. Improved aeration from prior exam. Small left pleural effusion. No pneumothorax. IMPRESSION: 1. Persistent bilateral nodular opacities are again noted throughout both lung fields. Again septic emboli could present this fashion. Other etiologies pulmonary nodular lesions cannot be excluded. 2. Low lung volumes with bibasilar atelectasis. Improved aeration from prior exam. Small left pleural effusion. Electronically Signed   By: Maisie Fus  Register   On: 04/19/2021 07:45        Scheduled Meds:  enoxaparin (LOVENOX) injection  70 mg Subcutaneous Q12H   feeding supplement  237 mL Oral BID BM   lidocaine  1 patch Transdermal Q24H   melatonin  3 mg Oral QHS   methadone  10 mg Oral BID   nicotine  21 mg Transdermal Daily   pantoprazole  40 mg Oral Daily   propranolol  10 mg Oral BID   Continuous Infusions:  sodium chloride Stopped (04/17/21 1106)   vancomycin 1,000 mg (04/19/21 1111)     LOS: 25 days        Kathlen Mody, MD [facility]   To contact the attending provider between 7A-7P or the covering provider during after hours 7P-7A, please log into the web site [email or web address] and access using universal [place] password for that web site. If you do not have the password, please call the hospital operator.  04/19/2021, 4:37 PM

## 2021-04-19 NOTE — Progress Notes (Signed)
Pharmacy Antibiotic Note  Duane Price is a 35 y.o. male admitted on 03/25/2021 with MRSA endocarditis w/ TV vegetation, septic emboli.  Pharmacy has been consulted for vancomycin.  Scr stable at 0.7 on last check 7/24. VP came back at 52, VT came back at 11 - calculated AUC was 610. Did have Tmax 103.5 overnight with productive cough noted.   Plan: Reduce Vancomycin to 1000mg  IV Q12 for estAUC 487 F/u BMP, renal fxn, clinical status.  Vanc planned until 8/2, followed by 2 weeks linezolid   Height: 6' (182.9 cm) Weight: 67.4 kg (148 lb 8 oz) IBW/kg (Calculated) : 77.6  Temp (24hrs), Avg:100.3 F (37.9 C), Min:98.7 F (37.1 C), Max:103.5 F (39.7 C)  Recent Labs  Lab 04/15/21 0153 04/17/21 0239 04/18/21 1444 04/18/21 2320 04/19/21 0827  WBC 14.7* 12.5*  --   --   --   CREATININE 0.67 0.70  --   --   --   VANCOTROUGH  --   --   --   --  11*  VANCOPEAK  --   --  18* 52*  --      Estimated Creatinine Clearance: 126.4 mL/min (by C-G formula based on SCr of 0.7 mg/dL).    No Known Allergies  Antimicrobials this admission: Cefepime 7/1 >> 7/2 Vancomycin 7/1 >>   Vanc levels:  7/4 VP 32/VT 10 - AUC 530 (at goal) - no change 7/12 VT 7 - no change 7/13 VP 17/VT 16, AUC 403, continue 1250q12 7/17 VT 7/VP 50 (both suspected drawn incorrectly), continue 8/17 7/18 VP 23/VT 8, AUC 420, continue 1250mg  q12h 7/26 VP 52/VT 11 AUC 610 - reduce to 1000 mg q12 h  Microbiology results: 7/7 Bcx: ngtd 7/5 Tissue veg cx: MRSA 7/5 Bcx: 1/4 MRSA 7/1 BCx: 3/3 staph aureus, MecA+, MREJ+ 7/1 UA mod Hgb, + nitrite, specific gravity >1 7/1 HCV ab reactive, HIV neg, hep A/B neg  Thank you for involving pharmacy in this patient's care.  9/1, PharmD, BCCCP Clinical Pharmacist  Phone: [phone] 04/19/2021 10:15 AM  Please check AMION for all [facility] Pharm[facility] phone numbers After 10:00 PM, call Main Pharmacy [phone]

## 2021-04-19 NOTE — Progress Notes (Signed)
Dr Loney Loh notified of patient's temperature and productive cough. Chest Xray and blood cultures ordered.

## 2021-04-20 ENCOUNTER — Inpatient Hospital Stay: Payer: Self-pay

## 2021-04-20 LAB — EXPECTORATED SPUTUM ASSESSMENT W GRAM STAIN, RFLX TO RESP C

## 2021-04-20 LAB — CBC
HCT: 26.1 % — ABNORMAL LOW (ref 39.0–52.0)
Hemoglobin: 8.4 g/dL — ABNORMAL LOW (ref 13.0–17.0)
MCH: 29.1 pg (ref 26.0–34.0)
MCHC: 32.2 g/dL (ref 30.0–36.0)
MCV: 90.3 fL (ref 80.0–100.0)
Platelets: 285 10*3/uL (ref 150–400)
RBC: 2.89 MIL/uL — ABNORMAL LOW (ref 4.22–5.81)
RDW: 17.2 % — ABNORMAL HIGH (ref 11.5–15.5)
WBC: 5.7 10*3/uL (ref 4.0–10.5)
nRBC: 0 % (ref 0.0–0.2)

## 2021-04-20 LAB — BASIC METABOLIC PANEL
Anion gap: 6 (ref 5–15)
BUN: 14 mg/dL (ref 6–20)
CO2: 29 mmol/L (ref 22–32)
Calcium: 8.4 mg/dL — ABNORMAL LOW (ref 8.9–10.3)
Chloride: 96 mmol/L — ABNORMAL LOW (ref 98–111)
Creatinine, Ser: 0.77 mg/dL (ref 0.61–1.24)
GFR, Estimated: >60 mL/min (ref >60)
Glucose, Bld: 142 mg/dL — ABNORMAL HIGH (ref 70–99)
Potassium: 3.6 mmol/L (ref 3.5–5.1)
Sodium: 131 mmol/L — ABNORMAL LOW (ref 135–145)

## 2021-04-20 MED ORDER — SODIUM CHLORIDE 0.9% FLUSH
10.0000 mL | Freq: Two times a day (BID) | INTRAVENOUS | Status: DC
  Administered 2021-04-20 – 2021-04-24 (×9): 10 mL

## 2021-04-20 MED ORDER — SODIUM CHLORIDE 0.9% FLUSH
10.0000 mL | INTRAVENOUS | Status: DC | PRN
  Administered 2021-04-24: 10 mL

## 2021-04-20 MED ORDER — CHLORHEXIDINE GLUCONATE CLOTH 2 % EX PADS
6.0000 | MEDICATED_PAD | Freq: Every day | CUTANEOUS | Status: DC
  Administered 2021-04-20 – 2021-04-25 (×6): 6 via TOPICAL

## 2021-04-20 MED ORDER — METHADONE HCL 5 MG PO TABS
15.0000 mg | ORAL_TABLET | Freq: Two times a day (BID) | ORAL | Status: DC
  Administered 2021-04-20 – 2021-04-25 (×10): 15 mg via ORAL
  Filled 2021-04-20 (×10): qty 1

## 2021-04-20 NOTE — Progress Notes (Signed)
Dear Doctor: This patient has been identified as a candidate for PICC for the following reason (s): restarts due to phlebitis and infiltration in 24 hours. The IVT has stuck this patient multiple times and has started to replacing PIV's daily. If you agree, please write an order for the indicated device. For any questions contact the Vascular Access Team at [phone] if no answer, please leave a message.  Thank you for supporting the early vascular access assessment program.

## 2021-04-20 NOTE — Progress Notes (Signed)
IV consult received for this patient. Secure chatted with patient's nurse and Dr. Blake Divine regarding PICC recommendations by the VAST. Tomasita Morrow, RN [facility]

## 2021-04-20 NOTE — Progress Notes (Signed)
Peripherally Inserted Central Catheter Placement  The IV Nurse has discussed with the patient and/or persons authorized to consent for the patient, the purpose of this procedure and the potential benefits and risks involved with this procedure.  The benefits include less needle sticks, lab draws from the catheter, and the patient may be discharged home with the catheter. Risks include, but not limited to, infection, bleeding, blood clot (thrombus formation), and puncture of an artery; nerve damage and irregular heartbeat and possibility to perform a PICC exchange if needed/ordered by physician.  Alternatives to this procedure were also discussed.  Bard Power PICC patient education guide, fact sheet on infection prevention and patient information card has been provided to patient /or left at bedside.    PICC Placement Documentation  PICC Single Lumen 04/20/21 Right Basilic 36 cm 0 cm (Active)  Indication for Insertion or Continuance of Line Prolonged intravenous therapies 04/20/21 1717  Exposed Catheter (cm) 0 cm 04/20/21 1717  Site Assessment Clean;Dry;Intact 04/20/21 1717  Line Status Flushed;Saline locked;Blood return noted 04/20/21 1717  Dressing Type Transparent;Securing device 04/20/21 1717  Dressing Status Clean;Dry;Intact 04/20/21 1717  Antimicrobial disc in place? Yes 04/20/21 1717  Safety Lock Not Applicable 04/20/21 1717  Dressing Intervention New dressing;Other (Comment) 04/20/21 1717  Dressing Change Due 04/27/21 04/20/21 1717       Annett Fabian 04/20/2021, 5:19 PM

## 2021-04-20 NOTE — Progress Notes (Signed)
PROGRESS NOTE    Duane Price  MWU:[id number] DOB: 04-Mar-1986 DOA: 03/25/2021 PCP: Patient, No Pcp Per (Inactive)    Chief Complaint  Patient presents with   Chest Injury    Brief Narrative:  History of IVDU with daily heroin, found to have MRSA bacteremia, septic pulmonary emboli , tricuspid valve vegetation status post angio Vac debridement He is on IV antibiotics He is also started on anticoagulation for acute PE , diagnosed on 7/19 Need to reconsult ID on 8/1 Overnight pt became febrile, blood cultures , UA and CXR ordered. Pt reports worsening expectoration of sputum.   Assessment & Plan:   Principal Problem:   MRSA bacteremia Active Problems:   Septic embolism (HCC)   Normocytic anemia   IV drug abuse (HCC)   Endocarditis of tricuspid valve   HCV antibody positive   MRSA bacteremia Tricuspid valve endocarditis s/p angio vac debridement on 03/29/2021 with associated septic pulmonary emboli. Infectious disease recommended total of 6 weeks of IV antibiotics,. IV vancomycin to complete on 04/26/2021 and transition to linezolid 600 mg twice daily for the last 2 weeks. Plan for repeat echocardiogram on 04/26/2021. Repeat blood cultures ordered for an episode of fever. So far they have been negative.  Sputum cultures ordered.     Acute PE/ On Lovenox. Echocardiogram reviewed with the patient.   Anemia of acute illness Normocytic anemia Transfuse to keep hemoglobin greater than 7. Hemoglobin around 8.    History of a hepatitis C Outpatient follow-up with ID.    History of polysubstance abuse/IV drug abuse Pt reports he was on methadone 30 mg daily. Will request pharmacy to confirm the dose.  Increase the methadone to 15 mg BID.     Hyponatremia;  - probably from pulm septic emnboli.     Sinus tachycardia Probably secondary to the disease process.    DVT prophylaxis: (Lovenox/) Code Status: (Full code) Family Communication: none at bedside.   Disposition:   Status is: Inpatient  Remains inpatient appropriate because:IV treatments appropriate due to intensity of illness or inability to take PO  Dispo: The patient is from: Home              Anticipated d/c is to: Home              Patient currently is not medically stable to d/c.   Difficult to place patient No       Consultants:  ID   Procedures: none.   Antimicrobials:  Antibiotics Given (last 72 hours)     Date/Time Action Medication Dose Rate   04/17/21 2011 New Bag/Given   vancomycin (VANCOREADY) IVPB 1250 mg/250 mL 1,250 mg 166.7 mL/hr   04/18/21 0906 New Bag/Given   vancomycin (VANCOREADY) IVPB 1250 mg/250 mL 1,250 mg 166.7 mL/hr   04/18/21 2145 New Bag/Given   vancomycin (VANCOREADY) IVPB 1250 mg/250 mL 1,250 mg 166.7 mL/hr   04/19/21 1111 New Bag/Given   vancomycin (VANCOCIN) IVPB 1000 mg/200 mL premix 1,000 mg 200 mL/hr   04/19/21 2256 New Bag/Given   vancomycin (VANCOCIN) IVPB 1000 mg/200 mL premix 1,000 mg 200 mL/hr   04/20/21 1031 New Bag/Given   vancomycin (VANCOCIN) IVPB 1000 mg/200 mL premix 1,000 mg 200 mL/hr         Subjective: No new complaints.   Objective: Vitals:   04/19/21 2300 04/20/21 0455 04/20/21 0755 04/20/21 1018  BP: 96/60 99/62 97/67  96/62  Pulse: 87 98 85   Resp: 18 18 18    Temp: 99.1 F (37.3 C)  99.8 F (37.7 C) 99.1 F (37.3 C) (!) 97.4 F (36.3 C)  TempSrc: Oral Oral Oral Oral  SpO2: 99% 97%    Weight:      Height:        Intake/Output Summary (Last 24 hours) at 04/20/2021 1353 Last data filed at 04/20/2021 0226 Gross per 24 hour  Intake 466.93 ml  Output 1050 ml  Net -583.07 ml    Filed Weights   03/25/21 0304 03/27/21 1917 04/12/21 0636  Weight: 72.6 kg 70.9 kg 67.4 kg    Examination:  General exam: well developed gentleman, not indistress Respiratory system: clear to auscultation, no wheezing or rhonchi.  Cardiovascular system: RRR, no JVD, no pedal edema.  Gastrointestinal system: Abdomen  is soft, non tender non distended, bowel sounds wnl.  Central nervous system: Alert and oriented, non focal  Extremities: No pedal edema.  Skin: No rashes seen.  Psychiatry: Mood is appropriate.     Data Reviewed: I have personally reviewed following labs and imaging studies  CBC: Recent Labs  Lab 04/15/21 0153 04/17/21 0239 04/19/21 0828 04/20/21 0052  WBC 14.7* 12.5* 7.3 5.7  NEUTROABS  --   --  4.3  --   HGB 8.5* 8.8* 9.0* 8.4*  HCT 26.0* 26.6* 28.0* 26.1*  MCV 91.5 90.2 91.2 90.3  PLT 375 339 321 285     Basic Metabolic Panel: Recent Labs  Lab 04/15/21 0153 04/17/21 0239 04/19/21 0828 04/20/21 0052  NA 133* 134* 132* 131*  K 4.2 4.1 3.9 3.6  CL 99 99 97* 96*  CO2 27 27 29 29   GLUCOSE 119* 107* 108* 142*  BUN 18 15 14 14   CREATININE 0.67 0.70 0.85 0.77  CALCIUM 8.7* 8.8* 8.6* 8.4*  MG 2.0  --   --   --   PHOS 4.4  --   --   --      GFR: Estimated Creatinine Clearance: 126.4 mL/min (by C-G formula based on SCr of 0.77 mg/dL).  Liver Function Tests: No results for input(s): AST, ALT, ALKPHOS, BILITOT, PROT, ALBUMIN in the last 168 hours.  CBG: No results for input(s): GLUCAP in the last 168 hours.   Recent Results (from the past 240 hour(s))  Culture, blood (routine x 2)     Status: None (Preliminary result)   Collection Time: 04/19/21  6:40 AM   Specimen: BLOOD RIGHT HAND  Result Value Ref Range Status   Specimen Description BLOOD RIGHT HAND  Final   Special Requests   Final    BOTTLES DRAWN AEROBIC AND ANAEROBIC Blood Culture adequate volume   Culture   Final    NO GROWTH 1 DAY Performed at [facility], [address], [place], [address] [place]    Report Status PENDING  Incomplete  Culture, blood (routine x 2)     Status: None (Preliminary result)   Collection Time: 04/19/21  6:41 AM   Specimen: BLOOD LEFT HAND  Result Value Ref Range Status   Specimen Description BLOOD LEFT HAND  Final   Special Requests   Final    BOTTLES DRAWN  AEROBIC AND ANAEROBIC Blood Culture adequate volume   Culture   Final    NO GROWTH 1 DAY Performed at [facility], [address], [place], [address] [place]    Report Status PENDING  Incomplete  Expectorated Sputum Assessment w Gram Stain, Rflx to Resp Cult     Status: None   Collection Time: 04/20/21  5:28 AM  Specimen: Sputum  Result Value Ref Range Status   Specimen Description SPUTUM  Final   Special Requests NONE  Final   Sputum evaluation   Final    THIS SPECIMEN IS ACCEPTABLE FOR SPUTUM CULTURE Performed at [facility], [address] [place], [place] [address]    Report Status 04/20/2021 FINAL  Final  Culture, Respiratory w Gram Stain     Status: None (Preliminary result)   Collection Time: 04/20/21  5:28 AM   Specimen: SPU  Result Value Ref Range Status   Specimen Description SPUTUM  Final   Special Requests NONE Reflexed from [id number]  Final   Gram Stain   Final    ABUNDANT WBC PRESENT, PREDOMINANTLY PMN FEW GRAM POSITIVE COCCI Performed at [facility], [address], [place], [place] [address]    Culture PENDING  Incomplete   Report Status PENDING  Incomplete         Radiology Studies: DG CHEST PORT 1 VIEW  Result Date: 04/19/2021 CLINICAL DATA:  Cough.  Shortness of breath. EXAM: PORTABLE CHEST 1 VIEW COMPARISON:  CT 04/12/2021.  Chest x-ray 03/31/2021. FINDINGS: Interval removal of left IJ line. Mediastinum hilar structures normal. Heart size normal. Persistent bilateral nodular opacities are again noted throughout both lung fields. Again septic emboli could present this fashion. Other etiologies of pulmonary nodular lesions cannot be excluded. Low lung volumes with bibasilar atelectasis. Improved aeration from prior exam. Small left pleural effusion. No pneumothorax. IMPRESSION: 1. Persistent bilateral nodular opacities are again noted throughout both lung fields. Again septic emboli could present this fashion. Other etiologies pulmonary  nodular lesions cannot be excluded. 2. Low lung volumes with bibasilar atelectasis. Improved aeration from prior exam. Small left pleural effusion. Electronically Signed   By: Maisie Fus  Register   On: 04/19/2021 07:45        Scheduled Meds:  enoxaparin (LOVENOX) injection  70 mg Subcutaneous Q12H   feeding supplement  237 mL Oral BID BM   lidocaine  1 patch Transdermal Q24H   melatonin  3 mg Oral QHS   methadone  15 mg Oral BID   nicotine  21 mg Transdermal Daily   pantoprazole  40 mg Oral Daily   Continuous Infusions:  sodium chloride Stopped (04/17/21 1106)   vancomycin 1,000 mg (04/20/21 1031)     LOS: 26 days        Kathlen Mody, MD [facility]   To contact the attending provider between 7A-7P or the covering provider during after hours 7P-7A, please log into the web site [email or web address] and access using universal [place] password for that web site. If you do not have the password, please call the hospital operator.  04/20/2021, 1:53 PM

## 2021-04-21 NOTE — Care Management (Addendum)
1550 04-21-21 Case Manager spoke with Council Mechanic, the clinical supervisor at [facility] [phone]. Per Council Mechanic, the admission dates to [facility] are on Mondays and Tuesdays. The patient will remain on Methadone. [facility] cannot accept the patient on Neurontin, benzodiazapine's or stimulants. Case Manager spoke with the provider and she stated the patient should complete IV antibiotics on 8/2 which is a Tuesday. Provider states that the patient can then be discharged with oral zyvox for 2 more weeks. Case Manager will make Les, clinical supervisor aware.

## 2021-04-21 NOTE — Progress Notes (Signed)
PROGRESS NOTE    Duane Price  FKC:[id number] DOB: December 19, 1985 DOA: 03/25/2021 PCP: Patient, No Pcp Per (Inactive)    Chief Complaint  Patient presents with   Chest Injury    Brief Narrative:  History of IVDU with daily heroin, found to have MRSA bacteremia, septic pulmonary emboli , tricuspid valve vegetation status post angio Vac debridement He is on IV antibiotics He is also started on anticoagulation for acute PE , diagnosed on 7/19 Need to reconsult ID on 8/1 Overnight pt became febrile, blood cultures , UA and CXR ordered. Pt reports worsening expectoration of sputum.   Assessment & Plan:   Principal Problem:   MRSA bacteremia Active Problems:   Septic embolism (HCC)   Normocytic anemia   IV drug abuse (HCC)   Endocarditis of tricuspid valve   HCV antibody positive   MRSA bacteremia Tricuspid valve endocarditis s/p angio vac debridement on 03/29/2021 with associated septic pulmonary emboli. Infectious disease recommended total of 6 weeks of IV antibiotics,. IV vancomycin to complete on 04/26/2021 and transition to linezolid 600 mg twice daily for the last 2 weeks. Plan for repeat echocardiogram on 04/26/2021. Repeat blood cultures ordered for an episode of fever. So far they have been negative.  Sputum cultures ordered and pending.  Afebrile, . Wbc count wnl.     Acute PE On Lovenox transition to oral eliquis on discharge.  Echocardiogram reviewed with the patient. Pt is on RA.    Anemia of acute illness Normocytic anemia Transfuse to keep hemoglobin greater than 7. Hemoglobin around 8.    History of a hepatitis C Outpatient follow-up with ID.    History of polysubstance abuse/IV drug abuse Pt reports he was on methadone 30 mg daily. Will request pharmacy to confirm the dose.  Increase the methadone to 15 mg BID.     Hyponatremia;  - probably from pulm septic emnboli.  - sodium of 131.    Sinus tachycardia Probably secondary to the disease  process and PE.    DVT prophylaxis: (Lovenox/) Code Status: (Full code) Family Communication: none at bedside.  Disposition:   Status is: Inpatient  Remains inpatient appropriate because:IV treatments appropriate due to intensity of illness or inability to take PO  Dispo: The patient is from: Home              Anticipated d/c is to: Home              Patient currently is not medically stable to d/c.   Difficult to place patient No       Consultants:  ID   Procedures: none.   Antimicrobials:  Antibiotics Given (last 72 hours)     Date/Time Action Medication Dose Rate   04/18/21 2145 New Bag/Given   vancomycin (VANCOREADY) IVPB 1250 mg/250 mL 1,250 mg 166.7 mL/hr   04/19/21 1111 New Bag/Given   vancomycin (VANCOCIN) IVPB 1000 mg/200 mL premix 1,000 mg 200 mL/hr   04/19/21 2256 New Bag/Given   vancomycin (VANCOCIN) IVPB 1000 mg/200 mL premix 1,000 mg 200 mL/hr   04/20/21 1031 New Bag/Given   vancomycin (VANCOCIN) IVPB 1000 mg/200 mL premix 1,000 mg 200 mL/hr   04/20/21 2330 New Bag/Given   vancomycin (VANCOCIN) IVPB 1000 mg/200 mL premix 1,000 mg 200 mL/hr         Subjective: No chest pain, sob improving.   Objective: Vitals:   04/20/21 1942 04/21/21 0150 04/21/21 0446 04/21/21 0753  BP: 102/60 (!) 90/47 (!) 99/52 98/60  Pulse: 95 80 88  88  Resp: 15 16 18 18   Temp: 99 F (37.2 C) 98.6 F (37 C) 98.9 F (37.2 C) 98.1 F (36.7 C)  TempSrc: Oral Oral Oral Oral  SpO2:  96%  96%  Weight:      Height:        Intake/Output Summary (Last 24 hours) at 04/21/2021 1219 Last data filed at 04/21/2021 0400 Gross per 24 hour  Intake 120 ml  Output 1000 ml  Net -880 ml    Filed Weights   03/25/21 0304 03/27/21 1917 04/12/21 0636  Weight: 72.6 kg 70.9 kg 67.4 kg    Examination:  General exam: alert and comfortable.  Respiratory system: ar entry fair no wheezing or rhonchi.  Cardiovascular system: RRR, no JVD, no pedal edema.  Gastrointestinal system:  Abdomen is soft, non tender non distended, bowel sounds wnl.  Central nervous system: Alert and oriented, non focal  Extremities: No leg edema.  Skin: No rashes seen.  Psychiatry: Mood is appropriate.     Data Reviewed: I have personally reviewed following labs and imaging studies  CBC: Recent Labs  Lab 04/15/21 0153 04/17/21 0239 04/19/21 0828 04/20/21 0052  WBC 14.7* 12.5* 7.3 5.7  NEUTROABS  --   --  4.3  --   HGB 8.5* 8.8* 9.0* 8.4*  HCT 26.0* 26.6* 28.0* 26.1*  MCV 91.5 90.2 91.2 90.3  PLT 375 339 321 285     Basic Metabolic Panel: Recent Labs  Lab 04/15/21 0153 04/17/21 0239 04/19/21 0828 04/20/21 0052  NA 133* 134* 132* 131*  K 4.2 4.1 3.9 3.6  CL 99 99 97* 96*  CO2 27 27 29 29   GLUCOSE 119* 107* 108* 142*  BUN 18 15 14 14   CREATININE 0.67 0.70 0.85 0.77  CALCIUM 8.7* 8.8* 8.6* 8.4*  MG 2.0  --   --   --   PHOS 4.4  --   --   --      GFR: Estimated Creatinine Clearance: 126.4 mL/min (by C-G formula based on SCr of 0.77 mg/dL).  Liver Function Tests: No results for input(s): AST, ALT, ALKPHOS, BILITOT, PROT, ALBUMIN in the last 168 hours.  CBG: No results for input(s): GLUCAP in the last 168 hours.   Recent Results (from the past 240 hour(s))  Culture, blood (routine x 2)     Status: None (Preliminary result)   Collection Time: 04/19/21  6:40 AM   Specimen: BLOOD RIGHT HAND  Result Value Ref Range Status   Specimen Description BLOOD RIGHT HAND  Final   Special Requests   Final    BOTTLES DRAWN AEROBIC AND ANAEROBIC Blood Culture adequate volume   Culture   Final    NO GROWTH 1 DAY Performed at [facility], [address] [place], [facility] [address]    Report Status PENDING  Incomplete  Culture, blood (routine x 2)     Status: None (Preliminary result)   Collection Time: 04/19/21  6:41 AM   Specimen: BLOOD LEFT HAND  Result Value Ref Range Status   Specimen Description BLOOD LEFT HAND  Final   Special Requests   Final    BOTTLES  DRAWN AEROBIC AND ANAEROBIC Blood Culture adequate volume   Culture   Final    NO GROWTH 1 DAY Performed at [facility], [address] [place], [facility] [address]    Report Status PENDING  Incomplete  Expectorated Sputum Assessment w Gram Stain, Rflx to Resp Cult     Status:  None   Collection Time: 04/20/21  5:28 AM   Specimen: Sputum  Result Value Ref Range Status   Specimen Description SPUTUM  Final   Special Requests NONE  Final   Sputum evaluation   Final    THIS SPECIMEN IS ACCEPTABLE FOR SPUTUM CULTURE Performed at [facility], [address], [place], [place] [address]    Report Status 04/20/2021 FINAL  Final  Culture, Respiratory w Gram Stain     Status: None (Preliminary result)   Collection Time: 04/20/21  5:28 AM   Specimen: SPU  Result Value Ref Range Status   Specimen Description SPUTUM  Final   Special Requests NONE Reflexed from [id number]  Final   Gram Stain   Final    ABUNDANT WBC PRESENT, PREDOMINANTLY PMN FEW GRAM POSITIVE COCCI    Culture   Final    ABUNDANT STAPHYLOCOCCUS AUREUS SUSCEPTIBILITIES TO FOLLOW Performed at [facility], [address], [place], [place] [address]    Report Status PENDING  Incomplete         Radiology Studies: [place] EKG SITE RITE  Result Date: 04/20/2021 If Site Rite image not attached, placement could not be confirmed due to current cardiac rhythm.       Scheduled Meds:  Chlorhexidine Gluconate Cloth  6 each Topical Daily   enoxaparin (LOVENOX) injection  70 mg Subcutaneous Q12H   feeding supplement  237 mL Oral BID BM   lidocaine  1 patch Transdermal Q24H   melatonin  3 mg Oral QHS   methadone  15 mg Oral BID   nicotine  21 mg Transdermal Daily   pantoprazole  40 mg Oral Daily   sodium chloride flush  10-40 mL Intracatheter Q12H   Continuous Infusions:  sodium chloride 10 mL/hr at 04/20/21 2329   vancomycin 1,000 mg (04/20/21 2330)     LOS: 27 days        Kathlen Mody, MD [facility]   To contact the attending provider between 7A-7P or the covering provider during after hours 7P-7A, please log into the web site [email or web address] and access using universal [place] password for that web site. If you do not have the password, please call the hospital operator.  04/21/2021, 12:19 PM

## 2021-04-21 NOTE — Care Management (Signed)
1158 04-21-21 Case Manager received a consult regarding Eliquis. Patient does not have insurance- he will be able to get a 30 day supply with the 30 day free card.

## 2021-04-22 ENCOUNTER — Other Ambulatory Visit (HOSPITAL_COMMUNITY): Payer: Self-pay

## 2021-04-22 LAB — CULTURE, RESPIRATORY W GRAM STAIN

## 2021-04-22 MED ORDER — APIXABAN 5 MG PO TABS
5.0000 mg | ORAL_TABLET | Freq: Two times a day (BID) | ORAL | Status: DC
  Administered 2021-04-22 – 2021-04-25 (×6): 5 mg via ORAL
  Filled 2021-04-22 (×6): qty 1

## 2021-04-22 MED ORDER — ENSURE ENLIVE PO LIQD
237.0000 mL | Freq: Two times a day (BID) | ORAL | 12 refills | Status: DC
  Filled 2021-04-22: qty 237, 1d supply, fill #0

## 2021-04-22 MED ORDER — MELATONIN 3 MG PO TABS
3.0000 mg | ORAL_TABLET | Freq: Every day | ORAL | 0 refills | Status: DC
  Filled 2021-04-22: qty 30, 30d supply, fill #0

## 2021-04-22 MED ORDER — APIXABAN 5 MG PO TABS: 5.0000 mg | ORAL_TABLET | Freq: Two times a day (BID) | ORAL | Status: DC

## 2021-04-22 MED ORDER — HYDROXYZINE HCL 25 MG PO TABS
25.0000 mg | ORAL_TABLET | Freq: Three times a day (TID) | ORAL | 0 refills | Status: AC | PRN
  Filled 2021-04-22: qty 30, 10d supply, fill #0

## 2021-04-22 MED ORDER — LINEZOLID 600 MG PO TABS
600.0000 mg | ORAL_TABLET | Freq: Two times a day (BID) | ORAL | 0 refills | Status: DC
  Filled 2021-04-22: qty 30, 15d supply, fill #0

## 2021-04-22 MED ORDER — PANTOPRAZOLE SODIUM 40 MG PO TBEC
40.0000 mg | DELAYED_RELEASE_TABLET | Freq: Every day | ORAL | 0 refills | Status: DC
  Filled 2021-04-22: qty 30, 30d supply, fill #0

## 2021-04-22 NOTE — Progress Notes (Signed)
Pharmacy Antibiotic Note  Duane Price is a 35 y.o. male admitted on 03/25/2021 with MRSA endocarditis w/ TV vegetation, septic emboli.  Pharmacy has been consulted for vancomycin.  Scr stable at 0.77 on last check 7/24. Vancomycin treatment will end 8/2 and linezolid will be started. Will defer drawing vancomycin peak and trough.  Plan: Continue Vancomycin to 1000mg  IV Q12 for estAUC 487 F/u BMP, renal fxn, clinical status.  Vanc planned until 8/2, followed by 2 weeks linezolid   Height: 6' (182.9 cm) Weight: 67.4 kg (148 lb 8 oz) IBW/kg (Calculated) : 77.6  Temp (24hrs), Avg:98.3 F (36.8 C), Min:97.7 F (36.5 C), Max:99.5 F (37.5 C)  Recent Labs  Lab 04/17/21 0239 04/18/21 1444 04/18/21 2320 04/19/21 0827 04/19/21 0828 04/20/21 0052  WBC 12.5*  --   --   --  7.3 5.7  CREATININE 0.70  --   --   --  0.85 0.77  VANCOTROUGH  --   --   --  11*  --   --   VANCOPEAK  --  18* 52*  --   --   --      Estimated Creatinine Clearance: 126.4 mL/min (by C-G formula based on SCr of 0.77 mg/dL).    No Known Allergies  Antimicrobials this admission: Cefepime 7/1 >> 7/2 Vancomycin 7/1 >>   Vanc levels:  7/4 VP 32/VT 10 - AUC 530 (at goal) - no change 7/12 VT 7 - no change 7/13 VP 17/VT 16, AUC 403, continue 1250q12 7/17 VT 7/VP 50 (both suspected drawn incorrectly), continue 8/17 7/18 VP 23/VT 8, AUC 420, continue 1250mg  q12h 7/26 VP 52/VT 11 AUC 610 - reduce to 1000 mg q12 h  Microbiology results: 7/27 sputum: MRSA 7/27 RCx: staph aureus (pending suscep) 7/26 BCx x2: NG x1d 7/7 Bcx: neg 7/5 Tissue veg cx: MRSA 7/5 Bcx: 1/4 MRSA  7/1 BCx: 3/3 staph aureus, MecA+, MREJ+ 7/1 UA mod Hgb, + nitrite, specific gravity >1 7/1 HCV ab reactive, HIV neg, hep A/B neg   Thank you for allowing pharmacy to participate in this patient's care.  9/1, PharmD PGY1 Pharmacy Resident 04/22/2021 10:52 AM Check [email or web address] for unit specific pharmacy number

## 2021-04-22 NOTE — Plan of Care (Signed)
  Problem: Clinical Measurements: Goal: Respiratory complications will improve Outcome: Progressing   Problem: Activity: Goal: Risk for activity intolerance will decrease Outcome: Progressing   Problem: Nutrition: Goal: Adequate nutrition will be maintained Outcome: Progressing   

## 2021-04-22 NOTE — Progress Notes (Signed)
ANTICOAGULATION CONSULT NOTE - Initial Consult  Pharmacy Consult for Eliquis Indication: pulmonary embolus  No Known Allergies  Patient Measurements: Height: 6' (182.9 cm) Weight: 67.4 kg (148 lb 8 oz) IBW/kg (Calculated) : 77.6  Vital Signs: Temp: 98 F (36.7 C) (07/29 0807) Temp Source: Oral (07/29 0807) BP: 106/64 (07/29 0807) Pulse Rate: 85 (07/29 0807)  Labs: Recent Labs    04/20/21 0052  HGB 8.4*  HCT 26.1*  PLT 285  CREATININE 0.77    Estimated Creatinine Clearance: 126.4 mL/min (by C-G formula based on SCr of 0.77 mg/dL).   Medical History: History reviewed. No pertinent past medical history.  Medications:  Scheduled:   Chlorhexidine Gluconate Cloth  6 each Topical Daily   feeding supplement  237 mL Oral BID BM   lidocaine  1 patch Transdermal Q24H   melatonin  3 mg Oral QHS   methadone  15 mg Oral BID   nicotine  21 mg Transdermal Daily   pantoprazole  40 mg Oral Daily   sodium chloride flush  10-40 mL Intracatheter Q12H    Assessment: Pharmacy consulted to transition from therapeutic Lovenox to Eliquis for the treatment of pulmonary embolism. Therapeutic on Lovenox for over 1 week. Lovenox given at 0624, will resume Eliquis at 1800.  Plan:  Start Eliquis 5 mg twice daily this evening Monitor for signs and symptoms of bleeding  Thank you for allowing pharmacy to participate in this patient's care.  Enos Fling, PharmD PGY1 Pharmacy Resident 04/22/2021 11:04 AM Check [email or web address] for unit specific pharmacy number

## 2021-04-22 NOTE — Plan of Care (Signed)
  Problem: Health Behavior/Discharge Planning: Goal: Ability to manage health-related needs will improve Outcome: Progressing   

## 2021-04-22 NOTE — Progress Notes (Signed)
Paper work signed  & faxed

## 2021-04-22 NOTE — Progress Notes (Signed)
PROGRESS NOTE    Duane Price  BWG:[id number] DOB: [birth year] DOA: 03/25/2021 PCP: Patient, No Pcp Per (Inactive)    Chief Complaint  Patient presents with   Chest Injury    Brief Narrative:  History of IVDU with daily heroin, found to have MRSA bacteremia, septic pulmonary emboli , tricuspid valve vegetation status post angio Vac debridement He is on IV antibiotics He is also started on anticoagulation for acute PE , diagnosed on 7/19 Need to reconsult ID on 8/1 Patient is scheduled to be discharged on 8 /09/2020 after the IV vancomycin infusion.  Assessment & Plan:   Principal Problem:   MRSA bacteremia Active Problems:   Septic embolism (HCC)   Normocytic anemia   IV drug abuse (HCC)   Endocarditis of tricuspid valve   HCV antibody positive   MRSA bacteremia Tricuspid valve endocarditis s/p angio vac debridement on 03/29/2021 with associated septic pulmonary emboli. Infectious disease recommended total of 6 weeks of IV antibiotics,. IV vancomycin to complete on 04/25/2021 and transition to linezolid 600 mg twice daily for the last 15 days Plan for repeat echocardiogram on 04/25/2021. Repeat blood cultures ordered for an episode of fever. So far they have been negative.  Sputum cultures ordered, showed MRSA. Afebrile, . Wbc count wnl.     Acute PE Sent to Eliquis for discharge Echocardiogram reviewed with the patient. Pt is on RA.  Will need outpatient follow-up with PCP to continue Eliquis for 3 to 6 months.   Anemia of acute illness Normocytic anemia Transfuse to keep hemoglobin greater than 7. Hemoglobin around 8.    History of a hepatitis C Outpatient follow-up with ID.    History of polysubstance abuse/IV drug abuse Pt reports he was on methadone 30 mg daily. Will request pharmacy to confirm the dose.  Increase the methadone to 15 mg BID.     Hyponatremia;  - probably from pulm septic emnboli.  - sodium of 131.    Sinus tachycardia Probably  secondary to the disease process and PE. Resolved    DVT prophylaxis: (Lovenox/) Code Status: (Full code) Family Communication: none at bedside.  Disposition:   Status is: Inpatient  Remains inpatient appropriate because:IV treatments appropriate due to intensity of illness or inability to take PO  Dispo: The patient is from: Home              Anticipated d/c is to: Home              Patient currently is not medically stable to d/c.   Difficult to place patient No       Consultants:  ID   Procedures: none.   Antimicrobials:  Antibiotics Given (last 72 hours)     Date/Time Action Medication Dose Rate   04/19/21 2256 New Bag/Given   vancomycin (VANCOCIN) IVPB 1000 mg/200 mL premix 1,000 mg 200 mL/hr   04/20/21 1031 New Bag/Given   vancomycin (VANCOCIN) IVPB 1000 mg/200 mL premix 1,000 mg 200 mL/hr   04/20/21 2330 New Bag/Given   vancomycin (VANCOCIN) IVPB 1000 mg/200 mL premix 1,000 mg 200 mL/hr   04/21/21 1220 New Bag/Given   vancomycin (VANCOCIN) IVPB 1000 mg/200 mL premix 1,000 mg 200 mL/hr   04/21/21 2327 New Bag/Given   vancomycin (VANCOCIN) IVPB 1000 mg/200 mL premix 1,000 mg 200 mL/hr   04/22/21 1138 New Bag/Given   vancomycin (VANCOCIN) IVPB 1000 mg/200 mL premix 1,000 mg 200 mL/hr         Subjective: No chest pain shortness of breath  and cough are improving  Objective: Vitals:   04/22/21 0550 04/22/21 0807 04/22/21 1139 04/22/21 1209  BP: (!) 104/57 106/64 104/64 98/60  Pulse: 75 85  80  Resp: 17 18  18   Temp: 97.7 F (36.5 C) 98 F (36.7 C) 98.3 F (36.8 C) 98.2 F (36.8 C)  TempSrc: Oral Oral Oral Oral  SpO2: 94% 96% 96% 99%  Weight:      Height:        Intake/Output Summary (Last 24 hours) at 04/22/2021 1325 Last data filed at 04/22/2021 0551 Gross per 24 hour  Intake 320 ml  Output 650 ml  Net -330 ml    Filed Weights   03/25/21 0304 03/27/21 1917 04/12/21 0636  Weight: 72.6 kg 70.9 kg 67.4 kg    Examination:  General exam:  Well-developed gentleman not in any kind of distress Respiratory system: Air entry fair bilateral, no wheezing or rhonchi Cardiovascular system: Regular rate rhythm, no JVD, no pedal edema Gastrointestinal system: Abdomen is soft nontender nondistended bowel sounds normal Central nervous system: Alert and oriented, grossly nonfocal  Extremities: No pedal edema Skin: No rashes seen Psychiatry: Mood is appropriate    Data Reviewed: I have personally reviewed following labs and imaging studies  CBC: Recent Labs  Lab 04/17/21 0239 04/19/21 0828 04/20/21 0052  WBC 12.5* 7.3 5.7  NEUTROABS  --  4.3  --   HGB 8.8* 9.0* 8.4*  HCT 26.6* 28.0* 26.1*  MCV 90.2 91.2 90.3  PLT 339 321 285     Basic Metabolic Panel: Recent Labs  Lab 04/17/21 0239 04/19/21 0828 04/20/21 0052  NA 134* 132* 131*  K 4.1 3.9 3.6  CL 99 97* 96*  CO2 27 29 29   GLUCOSE 107* 108* 142*  BUN 15 14 14   CREATININE 0.70 0.85 0.77  CALCIUM 8.8* 8.6* 8.4*     GFR: Estimated Creatinine Clearance: 126.4 mL/min (by C-G formula based on SCr of 0.77 mg/dL).  Liver Function Tests: No results for input(s): AST, ALT, ALKPHOS, BILITOT, PROT, ALBUMIN in the last 168 hours.  CBG: No results for input(s): GLUCAP in the last 168 hours.   Recent Results (from the past 240 hour(s))  Culture, blood (routine x 2)     Status: None (Preliminary result)   Collection Time: 04/19/21  6:40 AM   Specimen: BLOOD RIGHT HAND  Result Value Ref Range Status   Specimen Description BLOOD RIGHT HAND  Final   Special Requests   Final    BOTTLES DRAWN AEROBIC AND ANAEROBIC Blood Culture adequate volume   Culture   Final    NO GROWTH 2 DAYS Performed at [facility], [address], [place], [facility] [address]    Report Status PENDING  Incomplete  Culture, blood (routine x 2)     Status: None (Preliminary result)   Collection Time: 04/19/21  6:41 AM   Specimen: BLOOD LEFT HAND  Result Value Ref Range Status   Specimen  Description BLOOD LEFT HAND  Final   Special Requests   Final    BOTTLES DRAWN AEROBIC AND ANAEROBIC Blood Culture adequate volume   Culture   Final    NO GROWTH 2 DAYS Performed at [facility], [address], [place], [facility] [address]    Report Status PENDING  Incomplete  Expectorated Sputum Assessment w Gram Stain, Rflx to Resp Cult     Status: None   Collection Time: 04/20/21  5:28 AM   Specimen: Sputum  Result Value  Ref Range Status   Specimen Description SPUTUM  Final   Special Requests NONE  Final   Sputum evaluation   Final    THIS SPECIMEN IS ACCEPTABLE FOR SPUTUM CULTURE Performed at [facility], [address] [place], [place] [address]    Report Status 04/20/2021 FINAL  Final  Culture, Respiratory w Gram Stain     Status: None   Collection Time: 04/20/21  5:28 AM   Specimen: SPU  Result Value Ref Range Status   Specimen Description SPUTUM  Final   Special Requests NONE Reflexed from [id number]  Final   Gram Stain   Final    ABUNDANT WBC PRESENT, PREDOMINANTLY PMN FEW GRAM POSITIVE COCCI Performed at [facility], [address] [place], [place] [address]    Culture   Final    ABUNDANT METHICILLIN RESISTANT STAPHYLOCOCCUS AUREUS   Report Status 04/22/2021 FINAL  Final   Organism ID, Bacteria METHICILLIN RESISTANT STAPHYLOCOCCUS AUREUS  Final      Susceptibility   Methicillin resistant staphylococcus aureus - MIC*    CIPROFLOXACIN >=8 RESISTANT Resistant     ERYTHROMYCIN >=8 RESISTANT Resistant     GENTAMICIN <=0.5 SENSITIVE Sensitive     OXACILLIN >=4 RESISTANT Resistant     TETRACYCLINE <=1 SENSITIVE Sensitive     VANCOMYCIN 1 SENSITIVE Sensitive     TRIMETH/SULFA <=10 SENSITIVE Sensitive     CLINDAMYCIN <=0.25 SENSITIVE Sensitive     RIFAMPIN <=0.5 SENSITIVE Sensitive     Inducible Clindamycin NEGATIVE Sensitive     * ABUNDANT METHICILLIN RESISTANT STAPHYLOCOCCUS AUREUS         Radiology Studies: [place] EKG SITE RITE  Result Date:  04/20/2021 If Site Rite image not attached, placement could not be confirmed due to current cardiac rhythm.       Scheduled Meds:  apixaban  5 mg Oral BID   Chlorhexidine Gluconate Cloth  6 each Topical Daily   feeding supplement  237 mL Oral BID BM   lidocaine  1 patch Transdermal Q24H   melatonin  3 mg Oral QHS   methadone  15 mg Oral BID   nicotine  21 mg Transdermal Daily   pantoprazole  40 mg Oral Daily   sodium chloride flush  10-40 mL Intracatheter Q12H   Continuous Infusions:  sodium chloride 10 mL/hr at 04/21/21 2326   vancomycin 1,000 mg (04/22/21 1138)     LOS: 28 days        Kathlen Mody, MD [facility]   To contact the attending provider between 7A-7P or the covering provider during after hours 7P-7A, please log into the web site [email or web address] and access using universal [place] password for that web site. If you do not have the password, please call the hospital operator.  04/22/2021, 1:25 PM

## 2021-04-22 NOTE — Care Management (Signed)
1146 04-22-21 Case Manager spoke with the  patient regarding PCP needs- appointment to be established at one of the community clinics. Patient [place] and he uses the [facility] for showers. Patient states he is without a cell phone at this time. Patient will return to tent-CSW will provide additional housing resources to the patient. Patient declines shelter resources- states he does not want his belongings stolen. Working with [facility] for medications- MATCH completed. 30 day free Eliquis should be obtained from [facility] and the Pharmacist on the unit is assisting with this patient regarding the patient assistance application for Eliquis. MD states that the patient will need Eliquis for at least 3 months. Staff RN has been asked to fax the form to the company. CSW will assist with bus passes on the day of d/c. Patient will discharge on 04-25-21 and he will go to [facility] on 04-26-21. [facility] states that [facility] is an outpatient clinic. Case Manager will continue to follow the patient for additional transition of care needs.

## 2021-04-23 LAB — CBC
HCT: 26.9 % — ABNORMAL LOW (ref 39.0–52.0)
Hemoglobin: 8.8 g/dL — ABNORMAL LOW (ref 13.0–17.0)
MCH: 29.4 pg (ref 26.0–34.0)
MCHC: 32.7 g/dL (ref 30.0–36.0)
MCV: 90 fL (ref 80.0–100.0)
Platelets: 245 10*3/uL (ref 150–400)
RBC: 2.99 MIL/uL — ABNORMAL LOW (ref 4.22–5.81)
RDW: 16.7 % — ABNORMAL HIGH (ref 11.5–15.5)
WBC: 4.7 10*3/uL (ref 4.0–10.5)
nRBC: 0 % (ref 0.0–0.2)

## 2021-04-23 LAB — BASIC METABOLIC PANEL
Anion gap: 7 (ref 5–15)
BUN: 10 mg/dL (ref 6–20)
CO2: 34 mmol/L — ABNORMAL HIGH (ref 22–32)
Calcium: 8.9 mg/dL (ref 8.9–10.3)
Chloride: 93 mmol/L — ABNORMAL LOW (ref 98–111)
Creatinine, Ser: 0.7 mg/dL (ref 0.61–1.24)
GFR, Estimated: >60 mL/min (ref >60)
Glucose, Bld: 99 mg/dL (ref 70–99)
Potassium: 4.3 mmol/L (ref 3.5–5.1)
Sodium: 134 mmol/L — ABNORMAL LOW (ref 135–145)

## 2021-04-23 NOTE — Progress Notes (Signed)
PROGRESS NOTE    Duane Price  DUK:[id number] DOB: 03-Mar-1986 DOA: 03/25/2021 PCP: Patient, No Pcp Per (Inactive)    Chief Complaint  Patient presents with   Chest Injury    Brief Narrative:  History of IVDU with daily heroin, found to have MRSA bacteremia, septic pulmonary emboli , tricuspid valve vegetation status post angio Vac debridement He is on IV antibiotics He is also started on anticoagulation for acute PE , diagnosed on 7/19 Need to reconsult ID on 8/1 Patient is scheduled to be discharged on 8 /09/2020 after the IV vancomycin infusion.  Assessment & Plan:   Principal Problem:   MRSA bacteremia Active Problems:   Septic embolism (HCC)   Normocytic anemia   IV drug abuse (HCC)   Endocarditis of tricuspid valve   HCV antibody positive   MRSA bacteremia Tricuspid valve endocarditis s/p angio vac debridement on 03/29/2021 with associated septic pulmonary emboli. Infectious disease recommended total of 6 weeks of IV antibiotics,. IV vancomycin to complete on 04/25/2021 and transition to linezolid 600 mg twice daily for the last 15 days Plan for repeat echocardiogram on 04/25/2021. Repeat blood cultures ordered for an episode of fever. So far they have been negative.  Sputum cultures ordered, showed MRSA sensitive to vancomycin.  Pt reports his cough is improving.  Afebrile, . Wbc count wnl.     Acute PE Transitioned to  Eliquis for discharge Echocardiogram reviewed with the patient. Repeat echo to be ordered on 04/25/21 prior to discharge.  Pt is on RA.  Will need outpatient follow-up with PCP to continue Eliquis for 3 to 6 months.   Anemia of acute illness Normocytic anemia Transfuse to keep hemoglobin greater than 7. Hemoglobin around 8.    History of a hepatitis C Outpatient follow-up with ID. Pt denies any nausea or vomiting.     History of polysubstance abuse/IV drug abuse Pt reports he was on methadone 30 mg daily. Increase the methadone to 15 mg BID.      Hyponatremia;  - probably from pulm septic emnboli.  Sodium has improved to 134.     Sinus tachycardia Probably secondary to the disease process and PE. Resolved  Borderline BP parameters,  Continue to monitor. Keep MAP>65 mmhg.  Pt asymptomatic.     DVT prophylaxis: Eliquis.  Code Status: (Full code) Family Communication: none at bedside.  Disposition:   Status is: Inpatient  Remains inpatient appropriate because:IV treatments appropriate due to intensity of illness or inability to take PO  Dispo: The patient is from: Home              Anticipated d/c is to: Home              Patient currently is not medically stable to d/c.   Difficult to place patient No       Consultants:  ID   Procedures: none.   Antimicrobials:  Antibiotics Given (last 72 hours)     Date/Time Action Medication Dose Rate   04/20/21 2330 New Bag/Given   vancomycin (VANCOCIN) IVPB 1000 mg/200 mL premix 1,000 mg 200 mL/hr   04/21/21 1220 New Bag/Given   vancomycin (VANCOCIN) IVPB 1000 mg/200 mL premix 1,000 mg 200 mL/hr   04/21/21 2327 New Bag/Given   vancomycin (VANCOCIN) IVPB 1000 mg/200 mL premix 1,000 mg 200 mL/hr   04/22/21 1138 New Bag/Given   vancomycin (VANCOCIN) IVPB 1000 mg/200 mL premix 1,000 mg 200 mL/hr   04/22/21 2257 New Bag/Given   vancomycin (VANCOCIN) IVPB 1000 mg/200  mL premix 1,000 mg 200 mL/hr   04/23/21 1104 New Bag/Given   vancomycin (VANCOCIN) IVPB 1000 mg/200 mL premix 1,000 mg 200 mL/hr         Subjective: No chest pain or sob, cough is improving.    Objective: Vitals:   04/23/21 0114 04/23/21 0652 04/23/21 0825 04/23/21 1141  BP: (!) 88/51 (!) 89/52 (!) 91/53 (!) 93/55  Pulse: 69 74    Resp: 17 17    Temp: 97.6 F (36.4 C) 98.2 F (36.8 C)  97.8 F (36.6 C)  TempSrc: Oral Oral  Oral  SpO2: 95% 98%  97%  Weight:      Height:        Intake/Output Summary (Last 24 hours) at 04/23/2021 1422 Last data filed at 04/23/2021 1357 Gross per 24  hour  Intake 867 ml  Output 1550 ml  Net -683 ml    Filed Weights   03/25/21 0304 03/27/21 1917 04/12/21 0636  Weight: 72.6 kg 70.9 kg 67.4 kg    Examination:  General exam: well developed gentleman, not in distress.  Respiratory system: air entry fair. No wheezing or rhonchi. On RA.  Cardiovascular system: RRR no JVD, no pedal edema.  Gastrointestinal system: Abdomen is soft, NT ND BS+ Central nervous system: Alert and oriented, non focal. Walking int he hall way Extremities: No leg edema.  Skin: No rashes seen.  Psychiatry: Mood appropriate.     Data Reviewed: I have personally reviewed following labs and imaging studies  CBC: Recent Labs  Lab 04/17/21 0239 04/19/21 0828 04/20/21 0052 04/23/21 0500  WBC 12.5* 7.3 5.7 4.7  NEUTROABS  --  4.3  --   --   HGB 8.8* 9.0* 8.4* 8.8*  HCT 26.6* 28.0* 26.1* 26.9*  MCV 90.2 91.2 90.3 90.0  PLT 339 321 285 245     Basic Metabolic Panel: Recent Labs  Lab 04/17/21 0239 04/19/21 0828 04/20/21 0052 04/23/21 0500  NA 134* 132* 131* 134*  K 4.1 3.9 3.6 4.3  CL 99 97* 96* 93*  CO2 27 29 29  34*  GLUCOSE 107* 108* 142* 99  BUN 15 14 14 10   CREATININE 0.70 0.85 0.77 0.70  CALCIUM 8.8* 8.6* 8.4* 8.9     GFR: Estimated Creatinine Clearance: 126.4 mL/min (by C-G formula based on SCr of 0.7 mg/dL).  Liver Function Tests: No results for input(s): AST, ALT, ALKPHOS, BILITOT, PROT, ALBUMIN in the last 168 hours.  CBG: No results for input(s): GLUCAP in the last 168 hours.   Recent Results (from the past 240 hour(s))  Culture, blood (routine x 2)     Status: None (Preliminary result)   Collection Time: 04/19/21  6:40 AM   Specimen: BLOOD RIGHT HAND  Result Value Ref Range Status   Specimen Description BLOOD RIGHT HAND  Final   Special Requests   Final    BOTTLES DRAWN AEROBIC AND ANAEROBIC Blood Culture adequate volume   Culture   Final    NO GROWTH 2 DAYS Performed at [facility], [address]  [place], [address] [place]    Report Status PENDING  Incomplete  Culture, blood (routine x 2)     Status: None (Preliminary result)   Collection Time: 04/19/21  6:41 AM   Specimen: BLOOD LEFT HAND  Result Value Ref Range Status   Specimen Description BLOOD LEFT HAND  Final   Special Requests   Final    BOTTLES DRAWN AEROBIC AND ANAEROBIC Blood Culture adequate volume  Culture   Final    NO GROWTH 2 DAYS Performed at [facility], [address] [place], [place] [address]    Report Status PENDING  Incomplete  Expectorated Sputum Assessment w Gram Stain, Rflx to Resp Cult     Status: None   Collection Time: 04/20/21  5:28 AM   Specimen: Sputum  Result Value Ref Range Status   Specimen Description SPUTUM  Final   Special Requests NONE  Final   Sputum evaluation   Final    THIS SPECIMEN IS ACCEPTABLE FOR SPUTUM CULTURE Performed at [facility], [address], [place], [place] [address]    Report Status 04/20/2021 FINAL  Final  Culture, Respiratory w Gram Stain     Status: None   Collection Time: 04/20/21  5:28 AM   Specimen: SPU  Result Value Ref Range Status   Specimen Description SPUTUM  Final   Special Requests NONE Reflexed from [id number]  Final   Gram Stain   Final    ABUNDANT WBC PRESENT, PREDOMINANTLY PMN FEW GRAM POSITIVE COCCI Performed at [facility], [address] [place], [place] [address]    Culture   Final    ABUNDANT METHICILLIN RESISTANT STAPHYLOCOCCUS AUREUS   Report Status 04/22/2021 FINAL  Final   Organism ID, Bacteria METHICILLIN RESISTANT STAPHYLOCOCCUS AUREUS  Final      Susceptibility   Methicillin resistant staphylococcus aureus - MIC*    CIPROFLOXACIN >=8 RESISTANT Resistant     ERYTHROMYCIN >=8 RESISTANT Resistant     GENTAMICIN <=0.5 SENSITIVE Sensitive     OXACILLIN >=4 RESISTANT Resistant     TETRACYCLINE <=1 SENSITIVE Sensitive     VANCOMYCIN 1 SENSITIVE Sensitive     TRIMETH/SULFA <=10 SENSITIVE Sensitive     CLINDAMYCIN  <=0.25 SENSITIVE Sensitive     RIFAMPIN <=0.5 SENSITIVE Sensitive     Inducible Clindamycin NEGATIVE Sensitive     * ABUNDANT METHICILLIN RESISTANT STAPHYLOCOCCUS AUREUS         Radiology Studies: No results found.      Scheduled Meds:  apixaban  5 mg Oral BID   Chlorhexidine Gluconate Cloth  6 each Topical Daily   feeding supplement  237 mL Oral BID BM   lidocaine  1 patch Transdermal Q24H   melatonin  3 mg Oral QHS   methadone  15 mg Oral BID   nicotine  21 mg Transdermal Daily   pantoprazole  40 mg Oral Daily   sodium chloride flush  10-40 mL Intracatheter Q12H   Continuous Infusions:  sodium chloride 10 mL/hr at 04/21/21 2326   vancomycin 1,000 mg (04/23/21 1104)     LOS: 29 days        Kathlen Mody, MD [facility]   To contact the attending provider between 7A-7P or the covering provider during after hours 7P-7A, please log into the web site [email or web address] and access using universal [place] password for that web site. If you do not have the password, please call the hospital operator.  04/23/2021, 2:22 PM

## 2021-04-24 LAB — CULTURE, BLOOD (ROUTINE X 2)
Culture: NO GROWTH
Culture: NO GROWTH
Special Requests: ADEQUATE
Special Requests: ADEQUATE

## 2021-04-24 NOTE — Progress Notes (Signed)
PROGRESS NOTE    Duane Price  MQK:[id number] DOB: 03-14-1986 DOA: 03/25/2021 PCP: Patient, No Pcp Per (Inactive)    Chief Complaint  Patient presents with   Chest Injury    Brief Narrative:  History of IVDU with daily heroin, found to have MRSA bacteremia, septic pulmonary emboli , tricuspid valve vegetation status post angio Vac debridement He is on IV antibiotics He is also started on anticoagulation for acute PE , diagnosed on 7/19 Need to reconsult ID on 8/1 Patient is scheduled to be discharged on 8 /09/2020 after the IV vancomycin infusion. PT seen and examined at bedside. No new complaints.   Assessment & Plan:   Principal Problem:   MRSA bacteremia Active Problems:   Septic embolism (HCC)   Normocytic anemia   IV drug abuse (HCC)   Endocarditis of tricuspid valve   HCV antibody positive   MRSA bacteremia Tricuspid valve endocarditis s/p angio vac debridement on 03/29/2021 with associated septic pulmonary emboli. Infectious disease recommended total of 6 weeks of IV antibiotics,. IV vancomycin to complete on 04/25/2021 and transition to linezolid 600 mg twice daily for the last 15 days Plan for repeat echocardiogram on 04/25/2021. Repeat blood cultures ordered for an episode of fever. So far they have been negative.  Sputum cultures ordered, showed MRSA sensitive to vancomycin.  Slight cough,  Afebrile, . Wbc count wnl.     Acute PE Transitioned to  Eliquis for discharge Echocardiogram reviewed with the patient. Repeat echo to be ordered on 04/25/21 prior to discharge.  Pt is on RA.  Will need outpatient follow-up with PCP to continue Eliquis for 3 to 6 months.   Anemia of acute illness Normocytic anemia Transfuse to keep hemoglobin greater than 7. Hemoglobin around 8.    History of a hepatitis C Outpatient follow-up with ID. Pt denies any nausea or vomiting.     History of polysubstance abuse/IV drug abuse Pt reports he was on methadone 30 mg  daily. Increase the methadone to 15 mg BID.  Recommend outpatient follow up with methadone clinic on discharge.     Hyponatremia;  - probably from pulm septic emnboli.  Sodium has improved to 134.     Sinus tachycardia Probably secondary to the disease process and PE. Resolved  Borderline BP parameters,  Continue to monitor. Pt remains asymptomatic.  Keep MAP>65 mmhg.      DVT prophylaxis: Eliquis.  Code Status: (Full code) Family Communication: none at bedside.  Disposition:   Status is: Inpatient  Remains inpatient appropriate because:IV treatments appropriate due to intensity of illness or inability to take PO  Dispo: The patient is from: Home              Anticipated d/c is to: Home              Patient currently is not medically stable to d/c.   Difficult to place patient No       Consultants:  ID   Procedures: none.   Antimicrobials:  Antibiotics Given (last 72 hours)     Date/Time Action Medication Dose Rate   04/21/21 2327 New Bag/Given   vancomycin (VANCOCIN) IVPB 1000 mg/200 mL premix 1,000 mg 200 mL/hr   04/22/21 1138 New Bag/Given   vancomycin (VANCOCIN) IVPB 1000 mg/200 mL premix 1,000 mg 200 mL/hr   04/22/21 2257 New Bag/Given   vancomycin (VANCOCIN) IVPB 1000 mg/200 mL premix 1,000 mg 200 mL/hr   04/23/21 1104 New Bag/Given   vancomycin (VANCOCIN) IVPB 1000 mg/200 mL  premix 1,000 mg 200 mL/hr   04/23/21 2321 New Bag/Given   vancomycin (VANCOCIN) IVPB 1000 mg/200 mL premix 1,000 mg 200 mL/hr   04/24/21 1126 New Bag/Given   vancomycin (VANCOCIN) IVPB 1000 mg/200 mL premix 1,000 mg 200 mL/hr         Subjective: No new complaints.    Objective: Vitals:   04/24/21 0011 04/24/21 0340 04/24/21 0747 04/24/21 1230  BP: (!) 97/50 (!) 105/53 (!) 94/59 (!) 98/58  Pulse: 70 84 81 80  Resp: 16 17 15 15   Temp: 97.8 F (36.6 C) 98.6 F (37 C) 98.1 F (36.7 C) 98.4 F (36.9 C)  TempSrc: Oral Oral Oral Oral  SpO2: 96% 97% 98% 97%   Weight:      Height:        Intake/Output Summary (Last 24 hours) at 04/24/2021 1459 Last data filed at 04/24/2021 1300 Gross per 24 hour  Intake 980 ml  Output 550 ml  Net 430 ml    Filed Weights   03/25/21 0304 03/27/21 1917 04/12/21 0636  Weight: 72.6 kg 70.9 kg 67.4 kg    Examination:  General exam: well developed gentleman, not in distress.  Respiratory system: air entry fair. No wheezing or rhonchi. On RA.  Cardiovascular system: RRR no JVD, no pedal edema.  Gastrointestinal system: Abdomen is soft, NT ND BS+ Central nervous system: Alert and oriented, non focal. Walking int he hall way Extremities: No leg edema.  Skin: No rashes seen.  Psychiatry: Mood appropriate.     Data Reviewed: I have personally reviewed following labs and imaging studies  CBC: Recent Labs  Lab 04/19/21 0828 04/20/21 0052 04/23/21 0500  WBC 7.3 5.7 4.7  NEUTROABS 4.3  --   --   HGB 9.0* 8.4* 8.8*  HCT 28.0* 26.1* 26.9*  MCV 91.2 90.3 90.0  PLT 321 285 245     Basic Metabolic Panel: Recent Labs  Lab 04/19/21 0828 04/20/21 0052 04/23/21 0500  NA 132* 131* 134*  K 3.9 3.6 4.3  CL 97* 96* 93*  CO2 29 29 34*  GLUCOSE 108* 142* 99  BUN 14 14 10   CREATININE 0.85 0.77 0.70  CALCIUM 8.6* 8.4* 8.9     GFR: Estimated Creatinine Clearance: 126.4 mL/min (by C-G formula based on SCr of 0.7 mg/dL).  Liver Function Tests: No results for input(s): AST, ALT, ALKPHOS, BILITOT, PROT, ALBUMIN in the last 168 hours.  CBG: No results for input(s): GLUCAP in the last 168 hours.   Recent Results (from the past 240 hour(s))  Culture, blood (routine x 2)     Status: None   Collection Time: 04/19/21  6:40 AM   Specimen: BLOOD RIGHT HAND  Result Value Ref Range Status   Specimen Description BLOOD RIGHT HAND  Final   Special Requests   Final    BOTTLES DRAWN AEROBIC AND ANAEROBIC Blood Culture adequate volume   Culture   Final    NO GROWTH 5 DAYS Performed at [facility],  [address] [place], [address] [place]    Report Status 04/24/2021 FINAL  Final  Culture, blood (routine x 2)     Status: None   Collection Time: 04/19/21  6:41 AM   Specimen: BLOOD LEFT HAND  Result Value Ref Range Status   Specimen Description BLOOD LEFT HAND  Final   Special Requests   Final    BOTTLES DRAWN AEROBIC AND ANAEROBIC Blood Culture adequate volume   Culture   Final  NO GROWTH 5 DAYS Performed at [facility], [address] [place], [place] [address]    Report Status 04/24/2021 FINAL  Final  Expectorated Sputum Assessment w Gram Stain, Rflx to Resp Cult     Status: None   Collection Time: 04/20/21  5:28 AM   Specimen: Sputum  Result Value Ref Range Status   Specimen Description SPUTUM  Final   Special Requests NONE  Final   Sputum evaluation   Final    THIS SPECIMEN IS ACCEPTABLE FOR SPUTUM CULTURE Performed at [facility], [address], [place], [place] [address]    Report Status 04/20/2021 FINAL  Final  Culture, Respiratory w Gram Stain     Status: None   Collection Time: 04/20/21  5:28 AM   Specimen: SPU  Result Value Ref Range Status   Specimen Description SPUTUM  Final   Special Requests NONE Reflexed from [id number]  Final   Gram Stain   Final    ABUNDANT WBC PRESENT, PREDOMINANTLY PMN FEW GRAM POSITIVE COCCI Performed at [facility], [address], [place], [place] [address]    Culture   Final    ABUNDANT METHICILLIN RESISTANT STAPHYLOCOCCUS AUREUS   Report Status 04/22/2021 FINAL  Final   Organism ID, Bacteria METHICILLIN RESISTANT STAPHYLOCOCCUS AUREUS  Final      Susceptibility   Methicillin resistant staphylococcus aureus - MIC*    CIPROFLOXACIN >=8 RESISTANT Resistant     ERYTHROMYCIN >=8 RESISTANT Resistant     GENTAMICIN <=0.5 SENSITIVE Sensitive     OXACILLIN >=4 RESISTANT Resistant     TETRACYCLINE <=1 SENSITIVE Sensitive     VANCOMYCIN 1 SENSITIVE Sensitive     TRIMETH/SULFA <=10 SENSITIVE Sensitive     CLINDAMYCIN  <=0.25 SENSITIVE Sensitive     RIFAMPIN <=0.5 SENSITIVE Sensitive     Inducible Clindamycin NEGATIVE Sensitive     * ABUNDANT METHICILLIN RESISTANT STAPHYLOCOCCUS AUREUS         Radiology Studies: No results found.      Scheduled Meds:  apixaban  5 mg Oral BID   Chlorhexidine Gluconate Cloth  6 each Topical Daily   feeding supplement  237 mL Oral BID BM   lidocaine  1 patch Transdermal Q24H   melatonin  3 mg Oral QHS   methadone  15 mg Oral BID   nicotine  21 mg Transdermal Daily   pantoprazole  40 mg Oral Daily   sodium chloride flush  10-40 mL Intracatheter Q12H   Continuous Infusions:  sodium chloride 10 mL/hr at 04/23/21 2319   vancomycin 1,000 mg (04/24/21 1126)     LOS: 30 days        Kathlen Mody, MD [facility]   To contact the attending provider between 7A-7P or the covering provider during after hours 7P-7A, please log into the web site [email or web address] and access using universal [place] password for that web site. If you do not have the password, please call the hospital operator.  04/24/2021, 2:59 PM

## 2021-04-25 ENCOUNTER — Inpatient Hospital Stay (HOSPITAL_COMMUNITY): Payer: Self-pay

## 2021-04-25 ENCOUNTER — Other Ambulatory Visit (HOSPITAL_COMMUNITY): Payer: Self-pay

## 2021-04-25 DIAGNOSIS — I38 Endocarditis, valve unspecified: Secondary | ICD-10-CM

## 2021-04-25 LAB — ECHOCARDIOGRAM COMPLETE
Area-P 1/2: 3.08 cm2
Height: 72 in
S' Lateral: 3.2 cm
Weight: 2376 oz

## 2021-04-25 LAB — C-REACTIVE PROTEIN: CRP: 1.3 mg/dL — ABNORMAL HIGH (ref <1.0)

## 2021-04-25 MED ORDER — LINEZOLID 600 MG PO TABS
600.0000 mg | ORAL_TABLET | Freq: Two times a day (BID) | ORAL | 0 refills | Status: AC
  Filled 2021-04-25: qty 32, 16d supply, fill #0

## 2021-04-25 MED ORDER — APIXABAN 5 MG PO TABS
5.0000 mg | ORAL_TABLET | Freq: Two times a day (BID) | ORAL | 3 refills | Status: DC
  Filled 2021-04-25: qty 60, 30d supply, fill #0

## 2021-04-25 MED ORDER — LINEZOLID 600 MG PO TABS
600.0000 mg | ORAL_TABLET | Freq: Once | ORAL | Status: AC
  Administered 2021-04-25: 600 mg via ORAL
  Filled 2021-04-25: qty 1

## 2021-04-25 NOTE — Progress Notes (Signed)
[facility] Infectious Diseases Follow Up Note  Patient Identification: Patient Name: Duane Price MRN: [id number] Admit Date: 03/25/2021  3:06 AM Age: 35 y.o.Today's Date: 04/25/2021   Reason for Visit:TV endocarditis   Principal Problem:   MRSA bacteremia Active Problems:   Septic embolism (HCC)   Normocytic anemia   IV drug abuse (HCC)   Endocarditis of tricuspid valve   HCV antibody positive   Antibiotics: Vancomycin 7/1-c                     Cefepime 7/1-7/2  Lines/Tubes: Rt arm PICC line   Interval Events: Continues to remain afebrile, no leukocytosis.  Has occasional cough with phlegm production but no respiratory difficulty and comfortable in room air   Assessment MRSA bacteremia/tricuspid valve endocarditis complicated with septic pulmonary emboli Status post angio vac on 7/5.  Operative cultures growing MRSA Blood cultures cleared on 7/7 TTE 8/1 Vegetation on tricuspid valve measures 1.2cm x.08cm, appears smaller than prior echo.   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  Please see Dr. Orlando Penner recommendation on 7/18 for antibiotic plan Please ensure patient has 2 weeks worth of linezolid on discharge Patient already has a follow-up appointment at [facility] 05/06/21 at 10:15 am  Management of IVDU per primary  I will sign off for now. Please call with questions.   Rest of the management as per the primary team. Thank you for the consult. Please page with pertinent questions or concerns.  ______________________________________________________________________ Subjective patient seen and examined at the bedside. Denies fevers, chills and sweats. Denies SOB, chest pain. Occasional cough with minimal phlegm production. Denies nausea, vomiting, abdominal pain and diarrhea. Denies peripheral joint pain and back pain/swelling   Vitals BP (!) 97/54 (BP Location: Left Arm)    Pulse 78   Temp 97.8 F (36.6 C) (Oral)   Resp 18   Ht 6' (1.829 m)   Wt 67.4 kg   SpO2 100%   BMI 20.14 kg/m     Physical Exam Constitutional: Comfortably sitting up in bed, not in acute distress    Comments:   Cardiovascular:     Rate and Rhythm: Normal rate and regular rhythm.     Heart sounds:   Pulmonary:     Effort: Pulmonary effort is normal.     Comments: On room air  Abdominal:     Palpations: Abdomen is soft.     Tenderness: Nontender and nondistended  Musculoskeletal:        General: No swelling or tenderness.  No back tenderness  Skin:    Comments: No lesions or rashes  Neurological:     General: No focal deficit present.   Psychiatric:        Mood and Affect: Mood normal.   Pertinent Microbiology Results for orders placed or performed during the hospital encounter of 03/25/21  Blood culture (routine x 2)     Status: Abnormal   Collection Time: 03/25/21  5:07 AM   Specimen: BLOOD  Result Value Ref Range Status   Specimen Description   Final    BLOOD RIGHT ANTECUBITAL Performed at [facility], [address] [place], [place] [address]    Special Requests   Final    BOTTLES DRAWN AEROBIC ONLY Blood Culture adequate volume Performed at [facility], [address], [place], [place] [address]    Culture  Setup Time   Final    GRAM POSITIVE COCCI IN  CLUSTERS AEROBIC BOTTLE ONLY CRITICAL RESULT CALLED TO, READ BACK BY AND VERIFIED WITHErling Cruz [facility] 03/25/21 2301 JDW Performed at [facility], [address], [place], [place] [address]    Culture METHICILLIN RESISTANT STAPHYLOCOCCUS AUREUS (A)  Final   Report Status 03/27/2021 FINAL  Final   Organism ID, Bacteria METHICILLIN RESISTANT STAPHYLOCOCCUS AUREUS  Final      Susceptibility   Methicillin resistant staphylococcus aureus - MIC*    CIPROFLOXACIN >=8 RESISTANT Resistant     ERYTHROMYCIN >=8 RESISTANT Resistant     GENTAMICIN <=0.5 SENSITIVE  Sensitive     OXACILLIN >=4 RESISTANT Resistant     TETRACYCLINE <=1 SENSITIVE Sensitive     VANCOMYCIN 1 SENSITIVE Sensitive     TRIMETH/SULFA <=10 SENSITIVE Sensitive     CLINDAMYCIN <=0.25 SENSITIVE Sensitive     RIFAMPIN <=0.5 SENSITIVE Sensitive     Inducible Clindamycin NEGATIVE Sensitive     * METHICILLIN RESISTANT STAPHYLOCOCCUS AUREUS  Blood Culture ID Panel (Reflexed)     Status: Abnormal   Collection Time: 03/25/21  5:07 AM  Result Value Ref Range Status   Enterococcus faecalis NOT DETECTED NOT DETECTED Final   Enterococcus Faecium NOT DETECTED NOT DETECTED Final   Listeria monocytogenes NOT DETECTED NOT DETECTED Final   Staphylococcus species DETECTED (A) NOT DETECTED Final    Comment: CRITICAL RESULT CALLED TO, READ BACK BY AND VERIFIED WITH: E JACKSON PHARMD 03/25/21 2301 JDW    Staphylococcus aureus (BCID) DETECTED (A) NOT DETECTED Final    Comment: Methicillin (oxacillin)-resistant Staphylococcus aureus (MRSA). MRSA is predictably resistant to beta-lactam antibiotics (except ceftaroline). Preferred therapy is vancomycin unless clinically contraindicated. Patient requires contact precautions if  hospitalized. CRITICAL RESULT CALLED TO, READ BACK BY AND VERIFIED WITH: E JACKSON PHARMD 03/25/21 2301 JDW    Staphylococcus epidermidis NOT DETECTED NOT DETECTED Final   Staphylococcus lugdunensis NOT DETECTED NOT DETECTED Final   Streptococcus species NOT DETECTED NOT DETECTED Final   Streptococcus agalactiae NOT DETECTED NOT DETECTED Final   Streptococcus pneumoniae NOT DETECTED NOT DETECTED Final   Streptococcus pyogenes NOT DETECTED NOT DETECTED Final   A.calcoaceticus-baumannii NOT DETECTED NOT DETECTED Final   Bacteroides fragilis NOT DETECTED NOT DETECTED Final   Enterobacterales NOT DETECTED NOT DETECTED Final   Enterobacter cloacae complex NOT DETECTED NOT DETECTED Final   Escherichia coli NOT DETECTED NOT DETECTED Final   Klebsiella aerogenes NOT DETECTED NOT  DETECTED Final   Klebsiella oxytoca NOT DETECTED NOT DETECTED Final   Klebsiella pneumoniae NOT DETECTED NOT DETECTED Final   Proteus species NOT DETECTED NOT DETECTED Final   Salmonella species NOT DETECTED NOT DETECTED Final   Serratia marcescens NOT DETECTED NOT DETECTED Final   Haemophilus influenzae NOT DETECTED NOT DETECTED Final   Neisseria meningitidis NOT DETECTED NOT DETECTED Final   Pseudomonas aeruginosa NOT DETECTED NOT DETECTED Final   Stenotrophomonas maltophilia NOT DETECTED NOT DETECTED Final   Candida albicans NOT DETECTED NOT DETECTED Final   Candida auris NOT DETECTED NOT DETECTED Final   Candida glabrata NOT DETECTED NOT DETECTED Final   Candida krusei NOT DETECTED NOT DETECTED Final   Candida parapsilosis NOT DETECTED NOT DETECTED Final   Candida tropicalis NOT DETECTED NOT DETECTED Final   Cryptococcus neoformans/gattii NOT DETECTED NOT DETECTED Final   Meth resistant mecA/C and MREJ DETECTED (A) NOT DETECTED Final    Comment: CRITICAL RESULT CALLED TO, READ BACK BY AND VERIFIED WITHErling Cruz [facility] 03/25/21 2301 JDW Performed at [facility], [address] [place], [place] [address]   SARS CORONAVIRUS 2 (TAT 6-24 HRS) Nasopharyngeal Nasopharyngeal Swab     Status: None   Collection Time: 03/25/21  5:15 AM   Specimen: Nasopharyngeal Swab  Result Value Ref Range Status   SARS Coronavirus 2 NEGATIVE NEGATIVE Final    Comment: (NOTE) SARS-CoV-2 target nucleic acids are NOT DETECTED.  The SARS-CoV-2 RNA is generally detectable in upper and lower respiratory specimens during the acute phase of infection. Negative results do not preclude SARS-CoV-2 infection, do not rule out co-infections with other pathogens, and should not be used as the sole basis for treatment or other patient management decisions. Negative results must be combined with clinical observations, patient history, and epidemiological information. The expected result is Negative.  Fact  Sheet for Patients: HairSlick.no  Fact Sheet for Healthcare Providers: quierodirigir.com  This test is not yet approved or cleared by the [place] FDA and  has been authorized for detection and/or diagnosis of SARS-CoV-2 by FDA under an Emergency Use Authorization (EUA). This EUA will remain  in effect (meaning this test can be used) for the duration of the COVID-19 declaration under Se ction 564(b)(1) of the Act, 21 U.S.C. section 360bbb-3(b)(1), unless the authorization is terminated or revoked sooner.  Performed at [facility], [address], [place], [place] [address]   Blood culture (routine x 2)     Status: Abnormal   Collection Time: 03/25/21  5:49 AM   Specimen: BLOOD  Result Value Ref Range Status   Specimen Description   Final    BLOOD LEFT ARM Performed at [facility], [address] [place], [place] [address]    Special Requests   Final    BOTTLES DRAWN AEROBIC AND ANAEROBIC Blood Culture results may not be optimal due to an inadequate volume of blood received in culture bottles Performed at [facility], [address], [place], [place] [address]    Culture  Setup Time   Final    GRAM POSITIVE COCCI IN CLUSTERS IN BOTH AEROBIC AND ANAEROBIC BOTTLES IDENTIFICATION TO FOLLOW CRITICAL VALUE NOTED.  VALUE IS CONSISTENT WITH PREVIOUSLY REPORTED AND CALLED VALUE.    Culture (A)  Final    STAPHYLOCOCCUS AUREUS SUSCEPTIBILITIES PERFORMED ON PREVIOUS CULTURE WITHIN THE LAST 5 DAYS. Performed at [facility], [address] [place], [place] [address]    Report Status 03/27/2021 FINAL  Final  Surgical pcr screen     Status: Abnormal   Collection Time: 03/29/21  5:20 AM   Specimen: Nasal Mucosa; Nasal Swab  Result Value Ref Range Status   MRSA, PCR POSITIVE (A) NEGATIVE Final    Comment: RESULT CALLED TO, READ BACK BY AND VERIFIED WITH: DJoesphine Bare RN, AT  [id number] 03/29/21 D. VANHOOK    Staphylococcus aureus POSITIVE (A) NEGATIVE Final    Comment: (NOTE) The Xpert SA Assay (FDA approved for NASAL specimens in patients 61 years of age and older), is one component of a comprehensive surveillance program. It is not intended to diagnose infection nor to guide or monitor treatment. Performed at [facility], [address], [place], [place] [address]   Culture, blood (routine x 2)     Status: Abnormal   Collection Time: 03/29/21 10:00 AM   Specimen: BLOOD LEFT HAND  Result Value Ref Range Status   Specimen Description BLOOD LEFT HAND  Final   Special Requests   Final    BOTTLES DRAWN AEROBIC AND ANAEROBIC Blood  Culture results may not be optimal due to an inadequate volume of blood received in culture bottles   Culture  Setup Time   Final    GRAM POSITIVE COCCI IN CLUSTERS ANAEROBIC BOTTLE ONLY Organism ID to follow CRITICAL RESULT CALLED TO, READ BACK BY AND VERIFIED WITHKelby Fam [facility] [id number] 03/30/21 A BROWNING Performed at [facility], [address], [place], [place] [address]    Culture METHICILLIN RESISTANT STAPHYLOCOCCUS AUREUS (A)  Final   Report Status 04/01/2021 FINAL  Final   Organism ID, Bacteria METHICILLIN RESISTANT STAPHYLOCOCCUS AUREUS  Final      Susceptibility   Methicillin resistant staphylococcus aureus - MIC*    CIPROFLOXACIN >=8 RESISTANT Resistant     ERYTHROMYCIN >=8 RESISTANT Resistant     GENTAMICIN <=0.5 SENSITIVE Sensitive     OXACILLIN >=4 RESISTANT Resistant     TETRACYCLINE <=1 SENSITIVE Sensitive     VANCOMYCIN 1 SENSITIVE Sensitive     TRIMETH/SULFA <=10 SENSITIVE Sensitive     CLINDAMYCIN <=0.25 SENSITIVE Sensitive     RIFAMPIN <=0.5 SENSITIVE Sensitive     Inducible Clindamycin NEGATIVE Sensitive     * METHICILLIN RESISTANT STAPHYLOCOCCUS AUREUS  Blood Culture ID Panel (Reflexed)     Status: Abnormal   Collection Time: 03/29/21 10:00 AM  Result Value Ref Range Status   Enterococcus  faecalis NOT DETECTED NOT DETECTED Final   Enterococcus Faecium NOT DETECTED NOT DETECTED Final   Listeria monocytogenes NOT DETECTED NOT DETECTED Final   Staphylococcus species DETECTED (A) NOT DETECTED Final    Comment: CRITICAL RESULT CALLED TO, READ BACK BY AND VERIFIED WITHKelby Fam PHARMD [id number] 03/30/21 A BROWNING    Staphylococcus aureus (BCID) DETECTED (A) NOT DETECTED Final    Comment: Methicillin (oxacillin)-resistant Staphylococcus aureus (MRSA). MRSA is predictably resistant to beta-lactam antibiotics (except ceftaroline). Preferred therapy is vancomycin unless clinically contraindicated. Patient requires contact precautions if  hospitalized. CRITICAL RESULT CALLED TO, READ BACK BY AND VERIFIED WITH: Kelby Fam PHARMD [id number] 03/30/21 A BROWNING    Staphylococcus epidermidis NOT DETECTED NOT DETECTED Final   Staphylococcus lugdunensis NOT DETECTED NOT DETECTED Final   Streptococcus species NOT DETECTED NOT DETECTED Final   Streptococcus agalactiae NOT DETECTED NOT DETECTED Final   Streptococcus pneumoniae NOT DETECTED NOT DETECTED Final   Streptococcus pyogenes NOT DETECTED NOT DETECTED Final   A.calcoaceticus-baumannii NOT DETECTED NOT DETECTED Final   Bacteroides fragilis NOT DETECTED NOT DETECTED Final   Enterobacterales NOT DETECTED NOT DETECTED Final   Enterobacter cloacae complex NOT DETECTED NOT DETECTED Final   Escherichia coli NOT DETECTED NOT DETECTED Final   Klebsiella aerogenes NOT DETECTED NOT DETECTED Final   Klebsiella oxytoca NOT DETECTED NOT DETECTED Final   Klebsiella pneumoniae NOT DETECTED NOT DETECTED Final   Proteus species NOT DETECTED NOT DETECTED Final   Salmonella species NOT DETECTED NOT DETECTED Final   Serratia marcescens NOT DETECTED NOT DETECTED Final   Haemophilus influenzae NOT DETECTED NOT DETECTED Final   Neisseria meningitidis NOT DETECTED NOT DETECTED Final   Pseudomonas aeruginosa NOT DETECTED NOT DETECTED Final   Stenotrophomonas maltophilia  NOT DETECTED NOT DETECTED Final   Candida albicans NOT DETECTED NOT DETECTED Final   Candida auris NOT DETECTED NOT DETECTED Final   Candida glabrata NOT DETECTED NOT DETECTED Final   Candida krusei NOT DETECTED NOT DETECTED Final   Candida parapsilosis NOT DETECTED NOT DETECTED Final   Candida tropicalis NOT DETECTED NOT DETECTED Final   Cryptococcus neoformans/gattii NOT DETECTED NOT DETECTED Final  Meth resistant mecA/C and MREJ DETECTED (A) NOT DETECTED Final    Comment: CRITICAL RESULT CALLED TO, READ BACK BY AND VERIFIED WITHKelby Fam [facility] 0865 03/30/21 A BROWNING Performed at [facility], [address] [place], [place] [address]   Culture, blood (routine x 2)     Status: Abnormal   Collection Time: 03/29/21 11:30 AM   Specimen: BLOOD LEFT HAND  Result Value Ref Range Status   Specimen Description BLOOD LEFT HAND  Final   Special Requests   Final    BOTTLES DRAWN AEROBIC AND ANAEROBIC Blood Culture results may not be optimal due to an inadequate volume of blood received in culture bottles   Culture  Setup Time   Final    GRAM POSITIVE COCCI IN CLUSTERS ANAEROBIC BOTTLE ONLY CRITICAL VALUE NOTED.  VALUE IS CONSISTENT WITH PREVIOUSLY REPORTED AND CALLED VALUE.    Culture (A)  Final    STAPHYLOCOCCUS AUREUS SUSCEPTIBILITIES PERFORMED ON PREVIOUS CULTURE WITHIN THE LAST 5 DAYS. Performed at [facility], [address], [place], [place] [address]    Report Status 04/03/2021 FINAL  Final  Fungus Culture With Stain     Status: None (Preliminary result)   Collection Time: 03/29/21  4:32 PM   Specimen: Heart Valve; Tissue  Result Value Ref Range Status   Fungus Stain Final report  Final    Comment: (NOTE) Performed At: [facility] [address] [place], [place] [id number] Jolene Schimke MD UU:[id number]    Fungus (Mycology) Culture PENDING  Incomplete   Fungal Source TISSUE  Final    Comment: TRICUSPID VEGETATION Performed at [facility], [address], [place], [place] [address]   Aerobic/Anaerobic Culture w Gram Stain (surgical/deep wound)     Status: None   Collection Time: 03/29/21  4:32 PM   Specimen: Heart Valve; Tissue  Result Value Ref Range Status   Specimen Description TISSUE  Final   Special Requests TRICUSPID VEGETATION  Final   Culture   Final    ABUNDANT METHICILLIN RESISTANT STAPHYLOCOCCUS AUREUS NO ANAEROBES ISOLATED CRITICAL RESULT CALLED TO, READ BACK BY AND VERIFIED WITH: RN C.DAVIS AT 1055 ON 04/01/2021 BY T.SAAD. Performed at [facility], [address] [place], [place] [address]    Report Status 04/03/2021 FINAL  Final   Organism ID, Bacteria METHICILLIN RESISTANT STAPHYLOCOCCUS AUREUS  Final      Susceptibility   Methicillin resistant staphylococcus aureus - MIC*    CIPROFLOXACIN >=8 RESISTANT Resistant     ERYTHROMYCIN >=8 RESISTANT Resistant     GENTAMICIN <=0.5 SENSITIVE Sensitive     OXACILLIN >=4 RESISTANT Resistant     TETRACYCLINE <=1 SENSITIVE Sensitive     VANCOMYCIN 1 SENSITIVE Sensitive     TRIMETH/SULFA <=10 SENSITIVE Sensitive     CLINDAMYCIN <=0.25 SENSITIVE Sensitive     RIFAMPIN <=0.5 SENSITIVE Sensitive     Inducible Clindamycin NEGATIVE Sensitive     * ABUNDANT METHICILLIN RESISTANT STAPHYLOCOCCUS AUREUS  Acid Fast Smear (AFB)     Status: None   Collection Time: 03/29/21  4:32 PM   Specimen: Heart Valve; Tissue  Result Value Ref Range Status   AFB Specimen Processing Concentration  Final   Acid Fast Smear Negative  Final    Comment: (NOTE) Performed At: [facility] [address] [place], [place] [id number] Jolene Schimke MD [id number]    Source (AFB) TISSUE  Final    Comment: TRICUSPID VEGETATION Performed at [facility],  [address] [place], [place] [address]   Fungus Culture Result     Status: None   Collection Time: 03/29/21  4:32 PM  Result Value Ref Range Status   Result 1 Comment  Final    Comment: (NOTE) KOH/Calcofluor  preparation:  no fungus observed. Performed At: [facility] [address] [place], [place] [id number] Jolene Schimke MD GQ:[id number]   Culture, blood (routine x 2)     Status: None   Collection Time: 03/31/21 10:25 AM   Specimen: BLOOD LEFT HAND  Result Value Ref Range Status   Specimen Description BLOOD LEFT HAND  Final   Special Requests   Final    BOTTLES DRAWN AEROBIC AND ANAEROBIC Blood Culture adequate volume   Culture   Final    NO GROWTH 5 DAYS Performed at [facility], [address] [place], [place] [address]    Report Status 04/05/2021 FINAL  Final  Culture, blood (routine x 2)     Status: None   Collection Time: 03/31/21 10:28 AM   Specimen: BLOOD LEFT HAND  Result Value Ref Range Status   Specimen Description BLOOD LEFT HAND  Final   Special Requests   Final    BOTTLES DRAWN AEROBIC AND ANAEROBIC Blood Culture adequate volume   Culture   Final    NO GROWTH 5 DAYS Performed at [facility], [address], [place], [place] [address]    Report Status 04/05/2021 FINAL  Final  Culture, blood (routine x 2)     Status: None   Collection Time: 04/19/21  6:40 AM   Specimen: BLOOD RIGHT HAND  Result Value Ref Range Status   Specimen Description BLOOD RIGHT HAND  Final   Special Requests   Final    BOTTLES DRAWN AEROBIC AND ANAEROBIC Blood Culture adequate volume   Culture   Final    NO GROWTH 5 DAYS Performed at [facility], [address], [place], [place] [address]    Report Status 04/24/2021 FINAL  Final  Culture, blood (routine x 2)     Status: None   Collection Time: 04/19/21  6:41 AM   Specimen: BLOOD LEFT HAND  Result Value Ref Range Status   Specimen Description BLOOD LEFT HAND  Final   Special Requests   Final    BOTTLES DRAWN AEROBIC AND ANAEROBIC Blood Culture adequate volume   Culture   Final    NO GROWTH 5 DAYS Performed at [facility], [address], [place], [place] [address]    Report Status 04/24/2021  FINAL  Final  Expectorated Sputum Assessment w Gram Stain, Rflx to Resp Cult     Status: None   Collection Time: 04/20/21  5:28 AM   Specimen: Sputum  Result Value Ref Range Status   Specimen Description SPUTUM  Final   Special Requests NONE  Final   Sputum evaluation   Final    THIS SPECIMEN IS ACCEPTABLE FOR SPUTUM CULTURE Performed at [facility], [address] [place], [place] [address]    Report Status 04/20/2021 FINAL  Final  Culture, Respiratory w Gram Stain     Status: None   Collection Time: 04/20/21  5:28 AM   Specimen: SPU  Result Value Ref Range Status   Specimen Description SPUTUM  Final   Special Requests NONE Reflexed from [id number]  Final   Gram Stain   Final    ABUNDANT WBC PRESENT, PREDOMINANTLY PMN FEW GRAM POSITIVE COCCI  Performed at [facility], [address] [place], [place] [id number]    Culture   Final    ABUNDANT METHICILLIN RESISTANT STAPHYLOCOCCUS AUREUS   Report Status 04/22/2021 FINAL  Final   Organism ID, Bacteria METHICILLIN RESISTANT STAPHYLOCOCCUS AUREUS  Final      Susceptibility   Methicillin resistant staphylococcus aureus - MIC*    CIPROFLOXACIN >=8 RESISTANT Resistant     ERYTHROMYCIN >=8 RESISTANT Resistant     GENTAMICIN <=0.5 SENSITIVE Sensitive     OXACILLIN >=4 RESISTANT Resistant     TETRACYCLINE <=1 SENSITIVE Sensitive     VANCOMYCIN 1 SENSITIVE Sensitive     TRIMETH/SULFA <=10 SENSITIVE Sensitive     CLINDAMYCIN <=0.25 SENSITIVE Sensitive     RIFAMPIN <=0.5 SENSITIVE Sensitive     Inducible Clindamycin NEGATIVE Sensitive     * ABUNDANT METHICILLIN RESISTANT STAPHYLOCOCCUS AUREUS      Pertinent Lab. CBC Latest Ref Rng & Units 04/23/2021 04/20/2021 04/19/2021  WBC 4.0 - 10.5 K/uL 4.7 5.7 7.3  Hemoglobin 13.0 - 17.0 g/dL 6.0(A8.8(L) 5.4(U8.4(L) 9.8(J9.0(L)  Hematocrit 39.0 - 52.0 % 26.9(L) 26.1(L) 28.0(L)  Platelets 150 - 400 K/uL 245 285 321   CMP Latest Ref Rng & Units 04/23/2021 04/20/2021 04/19/2021  Glucose 70 - 99 mg/dL 99  191(Y142(H) 782(N108(H)  BUN 6 - 20 mg/dL 10 14 14   Creatinine 0.61 - 1.24 mg/dL 5.620.70 1.300.77 8.650.85  Sodium 135 - 145 mmol/L 134(L) 131(L) 132(L)  Potassium 3.5 - 5.1 mmol/L 4.3 3.6 3.9  Chloride 98 - 111 mmol/L 93(L) 96(L) 97(L)  CO2 22 - 32 mmol/L 34(H) 29 29  Calcium 8.9 - 10.3 mg/dL 8.9 7.8(I8.4(L) 6.9(G8.6(L)  Total Protein 6.5 - 8.1 g/dL - - -  Total Bilirubin 0.3 - 1.2 mg/dL - - -  Alkaline Phos 38 - 126 U/L - - -  AST 15 - 41 U/L - - -  ALT 0 - 44 U/L - - -     Pertinent Imaging today Plain films and CT images have been personally visualized and interpreted; radiology reports have been reviewed. Decision making incorporated into the Impression / Recommendations.  TTE 04/25/21 1. Left ventricular ejection fraction, by estimation, is 55 to 60%. The left ventricle has normal function. The left ventricle has no regional wall motion abnormalities. Left ventricular diastolic parameters were normal. 2. Right ventricular systolic function is normal. The right ventricular size is normal. There is normal pulmonary artery systolic pressure. The estimated right ventricular systolic pressure is 23.2 mmHg. 3. The mitral valve is normal in structure. No evidence of mitral valve regurgitation. 4. The aortic valve is tricuspid. Aortic valve regurgitation is not visualized. No aortic stenosis is present. 5. The inferior vena cava is normal in size with greater than 50% respiratory variability, suggesting right atrial pressure of 3 mmHg. 6. Vegetation on tricuspid valve measures 1.2cm x.08cm, appears smaller than prior echo. Mild Tricuspid regurgitation.   I spent more than 35 minutes for this patient encounter including review of prior medical records, coordination of care  with greater than 50% of time being face to face/counseling and discussing diagnostics/treatment plan with the patient/family.  Electronically signed by:   Odette FractionSabina Katoya Amato, MD Infectious Disease Physician [facility] Pager: [phone]

## 2021-04-25 NOTE — Progress Notes (Signed)
Discharge summary has been faxed to [phone], per request from case manager.

## 2021-04-25 NOTE — Social Work (Signed)
CSW faxed over clinicals to [place] with [facility] at [phone]. CSW provided patient with bus passes. CSW will continue to follow and assist with dc planning needs.

## 2021-04-25 NOTE — Progress Notes (Addendum)
Pt continues to cough up purulent sputum that is greenish brown in color & sometimes difficult for pt to expel.

## 2021-04-25 NOTE — Progress Notes (Signed)
  Echocardiogram 2D Echocardiogram has been performed.  Duane Price 04/25/2021, 10:57 AM

## 2021-04-25 NOTE — Care Management (Signed)
1207 04-25-21 Case Manager spoke with Council Mechanic, the clinical supervisor at [facility] ([facility]). Les reports that the patient will need to report to [facility] at 7:00 am. Clinicals to be faxed to [phone]. CSW will assist patient with bus passes. Per Council Mechanic, if the patient cannot be discharged today then the patient will not be able to enroll in [facility] until next Monday or Tuesday-MD aware. Case Manager made Staff RN that the patients medications are being held in the [facility]- unit pharmacist to pick up. Unit pharmacist is assisting with the Eliquis patient assistance application and re-faxing. [facility] to provide Eliquis. No further needs from Case Manager at this time.

## 2021-04-25 NOTE — Discharge Summary (Signed)
Physician Discharge Summary  Duane Price ZOX:[id number] DOB: [birth year] DOA: 03/25/2021  PCP: Patient, No Pcp Per (Inactive)  Admit date: 03/25/2021 Discharge date: 04/25/2021  Admitted From: Home.  Disposition: Home.   Recommendations for Outpatient Follow-up:  Follow up with PCP in 1-2 weeks Please obtain BMP/CBC in one week Please follow up with ID as recommended on 05/06/21 at 10:15 am.  Please follow up with methadone clinic for the continuation of methadone.   Discharge Condition:stable.  CODE STATUS:Full code.  Diet recommendation: Heart Healthy   Brief/Interim Summary:  History of IVDU with daily heroin, found to have MRSA bacteremia, septic pulmonary emboli , tricuspid valve vegetation status post angio Vac debridement/ . He was started on IV vancomycin on 03/25/21 and completed the IV vancomycin course today and he is being discharged on 04/25/21 on oral linezolid for 15 days to complete 6 weeks of treatment.  He is also started on anticoagulation for acute PE , diagnosed on 7/19.   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 Infectious disease recommended total of 6 weeks of  antibiotics IV vancomycin to complete on 04/25/2021 and transition to linezolid 600 mg twice daily for the next 15 days Reviewed repeat echocardiogram on 04/25/2021. Repeat blood cultures ordered for an episode of fever. So far they have been negative. Sputum cultures ordered, showed MRSA sensitive to vancomycin.  Patient remains afebrile and WBC count within normal limits       Acute PE Transitioned to  Eliquis for discharge Echocardiogram reviewed with the patient. Repeat echo to be ordered on 04/25/21 prior to discharge  shows decrease in the in the size of the vegetations. Pt is on RA.  Will need outpatient follow-up with PCP to continue Eliquis.      Anemia of acute illness Normocytic anemia Transfuse to keep hemoglobin greater than 7. Hemoglobin around 8.     History of a hepatitis C Outpatient follow-up with ID. Pt denies any nausea or vomiting.       History of polysubstance abuse/IV drug abuse Pt reports he was on methadone 30 mg daily. Increased the methadone to 15 mg BID.  Recommend outpatient follow up with methadone clinic on discharge.        Hyponatremia; - probably from pulm septic emnboli. Sodium has improved to 134.       Sinus tachycardia Probably secondary to the disease process and PE. Resolved   Borderline BP parameters, Pt remains asymptomatic.  Keep MAP>65 mmhg.      Discharge Instructions  Discharge Instructions     Diet - low sodium heart healthy   Complete by: As directed    Discharge instructions   Complete by: As directed    Please follow up with PCP as scheduled and ID as recommended.   Increase activity slowly   Complete by: As directed    No wound care   Complete by: As directed       Allergies as of 04/25/2021   No Known Allergies      Medication List     TAKE these medications    Eliquis 5 MG Tabs tablet Generic drug: apixaban Take 1 tablet (5 mg total) by mouth 2 (two) times daily.   feeding supplement  Liqd Take 237 mLs by mouth 2 (two) times daily between meals.   hydrOXYzine 25 MG tablet Commonly known as: ATARAX/VISTARIL Take 1 tablet (25 mg total) by mouth 3 (three) times daily as needed for anxiety.   linezolid 600 MG tablet Commonly known as: Zyvox Take 1 tablet (600 mg total) by mouth 2 (two) times daily for 16 days. Start on 04/25/21. Start taking on: April 26, 2021   melatonin 3 MG Tabs tablet Take 1 tablet (3 mg total) by mouth at bedtime.   pantoprazole 40 MG tablet Commonly known as: PROTONIX Take 1 tablet (40  mg total) by mouth daily.        Follow-up Information     [facility] [place] Follow up.   Specialty: Cardiothoracic Surgery Why: Your routine follow-up appointment is on 7/18 at 3:30pm. Please bring your hospital paperwork. Contact information: [address] [place], [address] [place] [address] [phone]        [facility] Follow up on 04/29/2021.   Why: @ 2:10 for hospital follow up appointment with Delorse Limber FNP. If you cannot make this scheduled appointment please call the office to reschedule. Contact information: [address] [place] [address]               No Known Allergies  Consultations: ID   Procedures/Studies: CT HEAD WO CONTRAST  Result Date: 04/13/2021 CLINICAL DATA:  Syncope EXAM: CT HEAD WITHOUT CONTRAST TECHNIQUE: Contiguous axial images were obtained from the base of the skull through the vertex without intravenous contrast. COMPARISON:  01/25/2009 FINDINGS: Brain: No evidence of acute infarction, hemorrhage, hydrocephalus, extra-axial collection or mass lesion/mass effect. Vascular: No hyperdense vessel or unexpected calcification. Skull: Prior frontal cranioplasty. Negative for acute calvarial fracture. Sinuses/Orbits: No acute finding. Other: None. IMPRESSION: 1. No acute intracranial findings. 2. Prior frontal cranioplasty. Electronically Signed   By: Duanne Guess D.O.   On: 04/13/2021 08:03   CT Angio Chest Pulmonary Embolism (PE) W or WO Contrast  Result Date: 04/12/2021 CLINICAL DATA:  Chest pain, shortness of breath EXAM: CT ANGIOGRAPHY CHEST WITH CONTRAST TECHNIQUE: Multidetector CT imaging of the chest was performed using the standard protocol during bolus administration of intravenous contrast. Multiplanar CT image reconstructions and MIPs were obtained to evaluate the vascular anatomy. CONTRAST:  66mL OMNIPAQUE IOHEXOL 350 MG/ML  SOLN COMPARISON:  03/25/2021 FINDINGS: Cardiovascular: Heart size normal. Trace pericardial fluid. The RV is nondilated. There is good contrast opacification of the pulmonary arterial tree. Single solitary segmental occlusion in the medial basal segment left lower lobe pulmonary artery branch, new since previous. Adequate contrast opacification of the thoracic aorta with no evidence of dissection, aneurysm, or stenosis. There is classic 3-vessel brachiocephalic arch anatomy without proximal stenosis. No significant atheromatous change. Mediastinum/Nodes: Subcentimeter prevascular and AP window lymph nodes. No hilar adenopathy. No mediastinal mass or hemorrhage. Lungs/Pleura: Multiple predominantly peripheral nodular opacities, some cavitary. Overall increase in disease load compared to prior study, although some discrete lesions have clearly enlarged e.g. posterior right upper lobe image 31/7, others improved e.g. lateral left upper lobe image 35/7. There is new confluent airspace consolidation in the left lower lobe superior and posterior basal segments. No pneumothorax. No pleural effusion. Upper Abdomen: No acute findings. Musculoskeletal: No chest wall abnormality. No acute or significant osseous findings. Review of the MIP images confirms the above findings. IMPRESSION: 1. Interval progression of bilateral septic pulmonary emboli. 2. Single posterior left lower lobe branch segmental  pulmonary embolus. Electronically Signed   By: Corlis Leak M.D.   On: 04/12/2021 15:35   MR THORACIC SPINE W WO CONTRAST  Result Date: 04/10/2021 CLINICAL DATA:  Bacteremia. Mid back pain. Rule out infection. IV drug use. EXAM: MRI THORACIC WITHOUT AND WITH CONTRAST TECHNIQUE: Multiplanar and multiecho pulse sequences of the thoracic spine were obtained without and with intravenous contrast. CONTRAST:  7mL GADAVIST GADOBUTROL 1 MMOL/ML IV SOLN COMPARISON:  None. FINDINGS: Alignment:  Normal Vertebrae: Negative for fracture or  mass. No evidence of spinal infection. There is mild edema at T12-L1 which is likely degenerative due to Schmorl's node anteriorly Cord:  Normal signal and morphology.  No cord compression. Paraspinal and other soft tissues: Numerous pulmonary nodules bilaterally. These may be due to septic emboli infection given the history. Disc levels: Negative for thoracic spinal stenosis. Small right-sided disc protrusion at T8-9. Mild disc degeneration and spurring T11-12 and T12-L1. IMPRESSION: Negative for thoracic spinal infection Mild thoracic disc degeneration without spinal stenosis Numerous pulmonary nodules bilaterally which may be due to septic emboli given history. Electronically Signed   By: Marlan Palau M.D.   On: 04/10/2021 16:39   DG CHEST PORT 1 VIEW  Result Date: 04/19/2021 CLINICAL DATA:  Cough.  Shortness of breath. EXAM: PORTABLE CHEST 1 VIEW COMPARISON:  CT 04/12/2021.  Chest x-ray 03/31/2021. FINDINGS: Interval removal of left IJ line. Mediastinum hilar structures normal. Heart size normal. Persistent bilateral nodular opacities are again noted throughout both lung fields. Again septic emboli could present this fashion. Other etiologies of pulmonary nodular lesions cannot be excluded. Low lung volumes with bibasilar atelectasis. Improved aeration from prior exam. Small left pleural effusion. No pneumothorax. IMPRESSION: 1. Persistent bilateral nodular opacities are again noted throughout both lung fields. Again septic emboli could present this fashion. Other etiologies pulmonary nodular lesions cannot be excluded. 2. Low lung volumes with bibasilar atelectasis. Improved aeration from prior exam. Small left pleural effusion. Electronically Signed   By: Maisie Fus  Register   On: 04/19/2021 07:45   DG CHEST PORT 1 VIEW  Result Date: 03/31/2021 CLINICAL DATA:  Reason for exam: chest pains Patient reports center chest pains with slight sob for past few hours. Current smoker. EXAM: PORTABLE CHEST - 1 VIEW  COMPARISON:  03/25/2021 FINDINGS: Interval increase in size and number of peripheral nodular pulmonary opacities. Left IJ central venous catheter placement to the mid SVC. No pneumothorax. Heart size and mediastinal contours are within normal limits. No effusion. Visualized bones unremarkable. IMPRESSION: 1. Worsening bilateral nodular pulmonary opacities. 2. Central line to the SVC without pneumothorax. Electronically Signed   By: Corlis Leak M.D.   On: 03/31/2021 10:51   DG C-Arm 1-60 Min-No Report  Result Date: 03/29/2021 Fluoroscopy was utilized by the requesting physician.  No radiographic interpretation.   ECHOCARDIOGRAM COMPLETE  Result Date: 04/25/2021    ECHOCARDIOGRAM REPORT   Patient Name:   Duane Price Date of Exam: 04/25/2021 Medical Rec #:  [id number]     Height:       72.0 in Accession #:    [id number]    Weight:       148.5 lb Date of Birth:  [birth year]     BSA:          1.877 m Patient Age:    34 years      BP:           104/57 mmHg Patient Gender: M  HR:           88 bpm. Exam Location:  Inpatient Procedure: 2D Echo, Cardiac Doppler and Color Doppler Indications:     Endocarditis I38  History:         Patient has prior history of Echocardiogram examinations, most                  recent 04/13/2021.  Sonographer:     Eulah Pont RDCS Referring Phys:  Herminio Heads Diagnosing Phys: Epifanio Lesches MD IMPRESSIONS  1. Left ventricular ejection fraction, by estimation, is 55 to 60%. The left ventricle has normal function. The left ventricle has no regional wall motion abnormalities. Left ventricular diastolic parameters were normal.  2. Right ventricular systolic function is normal. The right ventricular size is normal. There is normal pulmonary artery systolic pressure. The estimated right ventricular systolic pressure is 23.2 mmHg.  3. The mitral valve is normal in structure. No evidence of mitral valve regurgitation.  4. The aortic valve is tricuspid. Aortic valve  regurgitation is not visualized. No aortic stenosis is present.  5. The inferior vena cava is normal in size with greater than 50% respiratory variability, suggesting right atrial pressure of 3 mmHg.  6. Vegetation on tricuspid valve measures 1.2cm x.08cm, appears smaller than prior echo. Mild Tricuspid regurgitation. FINDINGS  Left Ventricle: Left ventricular ejection fraction, by estimation, is 55 to 60%. The left ventricle has normal function. The left ventricle has no regional wall motion abnormalities. The left ventricular internal cavity size was normal in size. There is  no left ventricular hypertrophy. Left ventricular diastolic parameters were normal. Right Ventricle: The right ventricular size is normal. No increase in right ventricular wall thickness. Right ventricular systolic function is normal. There is normal pulmonary artery systolic pressure. The tricuspid regurgitant velocity is 2.25 m/s, and  with an assumed right atrial pressure of 3 mmHg, the estimated right ventricular systolic pressure is 23.2 mmHg. Left Atrium: Left atrial size was normal in size. Right Atrium: Right atrial size was normal in size. Pericardium: There is no evidence of pericardial effusion. Mitral Valve: The mitral valve is normal in structure. No evidence of mitral valve regurgitation. Tricuspid Valve: Vegetation on tricuspid valve measures 1.2cm x.08cm, appears smaller than prior echo. The tricuspid valve is abnormal. Tricuspid valve regurgitation is mild. Aortic Valve: The aortic valve is tricuspid. Aortic valve regurgitation is not visualized. No aortic stenosis is present. Pulmonic Valve: The pulmonic valve was not well visualized. Pulmonic valve regurgitation is not visualized. Aorta: The aortic root is normal in size and structure. Venous: The inferior vena cava is normal in size with greater than 50% respiratory variability, suggesting right atrial pressure of 3 mmHg. IAS/Shunts: The interatrial septum was not well  visualized.  LEFT VENTRICLE PLAX 2D LVIDd:         4.70 cm  Diastology LVIDs:         3.20 cm  LV e' medial:    12.50 cm/s LV PW:         0.80 cm  LV E/e' medial:  5.7 LV IVS:        0.80 cm  LV e' lateral:   13.20 cm/s LVOT diam:     2.10 cm  LV E/e' lateral: 5.4 LV SV:         61 LV SV Index:   33 LVOT Area:     3.46 cm  RIGHT VENTRICLE RV S prime:     13.50 cm/s TAPSE (M-mode):  2.6 cm LEFT ATRIUM             Index       RIGHT ATRIUM           Index LA diam:        3.00 cm 1.60 cm/m  RA Area:     14.20 cm LA Vol (A2C):   32.7 ml 17.42 ml/m RA Volume:   35.40 ml  18.86 ml/m LA Vol (A4C):   27.8 ml 14.81 ml/m LA Biplane Vol: 30.9 ml 16.46 ml/m  AORTIC VALVE LVOT Vmax:   88.30 cm/s LVOT Vmean:  59.500 cm/s LVOT VTI:    0.177 m  AORTA Ao Root diam: 2.90 cm Ao Asc diam:  3.30 cm MITRAL VALVE               TRICUSPID VALVE MV Area (PHT): 3.08 cm    TR Peak grad:   20.2 mmHg MV Decel Time: 246 msec    TR Vmax:        225.00 cm/s MV E velocity: 71.00 cm/s MV A velocity: 68.60 cm/s  SHUNTS MV E/A ratio:  1.03        Systemic VTI:  0.18 m                            Systemic Diam: 2.10 cm Epifanio Lesches MD Electronically signed by Epifanio Lesches MD Signature Date/Time: 04/25/2021/12:36:58 PM    Final (Updated)    ECHO INTRAOPERATIVE TEE  Result Date: 04/03/2021  *INTRAOPERATIVE TRANSESOPHAGEAL REPORT *  Patient Name:   Duane Price Date of Exam: 03/29/2021 Medical Rec #:  [id number]     Height:       72.0 in Accession #:    [id number]    Weight:       156.3 lb Date of Birth:  [birth year]     BSA:          1.92 m Patient Age:    34 years      BP:           136/70 mmHg Patient Gender: M             HR:           134 bpm. Exam Location:  Inpatient Transesophogeal exam was perform intraoperatively during surgical procedure. Patient was closely monitored under general anesthesia during the entirety of examination. Indications:     Angiovac procedure Performing Phys: [id number] Eliezer Lofts LIGHTFOOT Diagnosing Phys:  Val Eagle MD Complications: No known complications during this procedure. POST-OP IMPRESSIONS - Tricuspid Valve: There is mild regurgitation.Post angiovac previous seen vegetation is no longer presennt. There appears to be a defect in the posterior tricuspid leaflet that may represent a perforation with associated mild TR anterioly directed through the defect. Surgeon notified. PRE-OP FINDINGS  Left Ventricle: The left ventricle has normal systolic function, with an ejection fraction of 60-65%. The cavity size was normal. There is no left ventricular hypertrophy. Right Ventricle: The right ventricle has normal systolic function. The cavity was normal. There is no increase in right ventricular wall thickness. Pericardium: A small pericardial effusion is present. The pericardial effusion is posterior to the left ventricle, surrounding the apex and localized near the right ventricle. Mitral Valve: The mitral valve is normal in structure. Mitral valve regurgitation is trivial by color flow Doppler. The MR jet is centrally-directed. There is no evidence of mitral valve vegetation. There is No evidence of mitral stenosis.  Tricuspid Valve: The tricuspid valve was degenerative in appearance. Tricuspid valve regurgitation is mild by color flow Doppler. No evidence of tricuspid stenosis is present. There is a large mobile tricuspid valve vegetation present on the posterior cusp. Aortic Valve: The aortic valve is normal in structure. Aortic valve regurgitation was not visualized by color flow Doppler. There is no stenosis of the aortic valve. There is no evidence of aortic valve vegetation. Pulmonic Valve: The pulmonic valve was normal in structure, with normal. No evidence of pumonic stenosis. Pulmonic valve regurgitation is trivial by color flow Doppler. Aorta: The aortic root are normal in size and structure. There is evidence of a dissection in the No aortic dissection. +-------------+------------++ AORTIC VALVE               +-------------+------------++ AV Vmax:     166.00 cm/s  +-------------+------------++ AV Vmean:    115.000 cm/s +-------------+------------++ AV VTI:      0.245 m      +-------------+------------++ AV Peak Grad:11.0 mmHg    +-------------+------------++ AV Mean Grad:6.0 mmHg     +-------------+------------++ LVOT Vmax:   128.00 cm/s  +-------------+------------++ LVOT Vmean:  87.500 cm/s  +-------------+------------++ LVOT VTI:    0.192 m      +-------------+------------++  +-------------+------+ SHUNTS              +-------------+------+ Systemic VTI:0.19 m +-------------+------+  Val Eagle MD Electronically signed by Val Eagle MD Signature Date/Time: 04/03/2021/5:51:56 PM    Final    VAS [place] LOWER EXTREMITY VENOUS (DVT)  Result Date: 04/13/2021  Lower Venous DVT Study Patient Name:  Duane Price  Date of Exam:   04/13/2021 Medical Rec #: [id number]      Accession #:    [id number] Date of Birth: 25-Oct-1985      Patient Gender: M Patient Age:   34Y Exam Location:  [facility] Procedure:      VAS [place] LOWER EXTREMITY VENOUS (DVT) Referring Phys: [id number] CALDWELL POWELL JR --------------------------------------------------------------------------------  Indications: Pulmonary embolism.  Comparison Study: no prior Performing Technologist: [place] Ponder RVS  Examination Guidelines: A complete evaluation includes B-mode imaging, spectral Doppler, color Doppler, and power Doppler as needed of all accessible portions of each vessel. Bilateral testing is considered an integral part of a complete examination. Limited examinations for reoccurring indications may be performed as noted. The reflux portion of the exam is performed with the patient in reverse Trendelenburg.    *See table(s) above for measurements and observations.    Preliminary    ECHOCARDIOGRAM LIMITED  Result Date: 04/13/2021    ECHOCARDIOGRAM LIMITED REPORT   Patient Name:    Duane Price Date of Exam: 04/13/2021 Medical Rec #:  [id number]     Height:       72.0 in Accession #:    [id number]    Weight:       148.5 lb Date of Birth:  1985/10/11     BSA:          1.877 m Patient Age:    34 years      BP:           114/68 mmHg Patient Gender: M             HR:           109 bpm. Exam Location:  Inpatient Procedure: Cardiac Doppler, Color Doppler and Limited Echo Indications:    I26.02 Pulmonary embolus  History:        Patient has prior history  of Echocardiogram examinations, most                 recent 03/29/2021. Angiovac 03/29/21; Endocarditis. IVDU                 hospitalized for 21 days now.  Sonographer:    Roosvelt Maserachel Lane RDCS Referring Phys: [id number] FANG XU IMPRESSIONS  1. Left ventricular ejection fraction, by estimation, is 65 to 70%. The left ventricle has hyperdynamic function. The left ventricle has no regional wall motion abnormalities.  2. Peak RV-RA gradient 33 mmHg. IVC not visualized. Right ventricular systolic function is normal. The right ventricular size is normal.  3. The mitral valve is normal in structure. No evidence of mitral valve regurgitation. No evidence of mitral stenosis. No vegetation.  4. 1.2 x 1 cm mobile vegetation on tricuspid valve, still significant but smaller than prior. The tricuspid valve is abnormal. Tricuspid valve regurgitation is mild.  5. The aortic valve is tricuspid. Aortic valve regurgitation is not visualized. No aortic stenosis is present. No vegetation. FINDINGS  Left Ventricle: Left ventricular ejection fraction, by estimation, is 65 to 70%. The left ventricle has hyperdynamic function. The left ventricle has no regional wall motion abnormalities. There is no left ventricular hypertrophy. Right Ventricle: Peak RV-RA gradient 33 mmHg. IVC not visualized. The right ventricular size is normal. No increase in right ventricular wall thickness. Right ventricular systolic function is normal. Left Atrium: Left atrial size was normal in size. Right  Atrium: Right atrial size was normal in size. Pericardium: Trivial pericardial effusion is present. Mitral Valve: The mitral valve is normal in structure. No evidence of mitral valve stenosis. Tricuspid Valve: 1.2 x 1 cm mobile vegetation on tricuspid valve, still significant but smaller than prior. The tricuspid valve is abnormal. Tricuspid valve regurgitation is mild. Aortic Valve: The aortic valve is tricuspid. Aortic valve regurgitation is not visualized. No aortic stenosis is present. Pulmonic Valve: The pulmonic valve was normal in structure. Pulmonic valve regurgitation is not visualized. Aorta: The aortic root is normal in size and structure. IAS/Shunts: No atrial level shunt detected by color flow Doppler. LEFT VENTRICLE PLAX 2D LVIDd:         4.10 cm LVIDs:         2.30 cm LV PW:         0.60 cm LV IVS:        1.00 cm  LEFT ATRIUM         Index LA diam:    3.90 cm 2.08 cm/m   AORTA Ao Root diam: 2.70 cm TRICUSPID VALVE TR Peak grad:   32.7 mmHg TR Vmax:        286.00 cm/s Marca Anconaalton Mclean MD Electronically signed by Marca Anconaalton Mclean MD Signature Date/Time: 04/13/2021/1:14:33 PM    Final    US EKG SITE RITE  Result Date: 04/20/2021 If Site Rite image not attached, placement could not be confirmed due to current cardiac rhythm.     Subjective: No new complaints.   Discharge Exam: Vitals:   04/25/21 1223 04/25/21 1542  BP: (!) 97/54 (!) 102/56  Pulse: 78   Resp: 18 16  Temp: 97.8 F (36.6 C) 97.7 F (36.5 C)  SpO2:     Vitals:   04/25/21 0657 04/25/21 0747 04/25/21 1223 04/25/21 1542  BP: (!) 104/57 (!) 95/54 (!) 97/54 (!) 102/56  Pulse: 80  78   Resp: 17  18 16   Temp: 97.8 F (36.6 C) 97.9 F (36.6 C) 97.8 F (36.6  C) 97.7 F (36.5 C)  TempSrc: Oral Oral Oral Oral  SpO2: 100%     Weight:      Height:        General: Pt is alert, awake, not in acute distress Cardiovascular: RRR, S1/S2 +, no rubs, no gallops Respiratory: CTA bilaterally, no wheezing, no rhonchi Abdominal:  Soft, NT, ND, bowel sounds + Extremities: no edema, no cyanosis    The results of significant diagnostics from this hospitalization (including imaging, microbiology, ancillary and laboratory) are listed below for reference.     Microbiology: Recent Results (from the past 240 hour(s))  Culture, blood (routine x 2)     Status: None   Collection Time: 04/19/21  6:40 AM   Specimen: BLOOD RIGHT HAND  Result Value Ref Range Status   Specimen Description BLOOD RIGHT HAND  Final   Special Requests   Final    BOTTLES DRAWN AEROBIC AND ANAEROBIC Blood Culture adequate volume   Culture   Final    NO GROWTH 5 DAYS Performed at [facility], [address], [place], [place] [address]    Report Status 04/24/2021 FINAL  Final  Culture, blood (routine x 2)     Status: None   Collection Time: 04/19/21  6:41 AM   Specimen: BLOOD LEFT HAND  Result Value Ref Range Status   Specimen Description BLOOD LEFT HAND  Final   Special Requests   Final    BOTTLES DRAWN AEROBIC AND ANAEROBIC Blood Culture adequate volume   Culture   Final    NO GROWTH 5 DAYS Performed at [facility], [address], [place], [place] [address]    Report Status 04/24/2021 FINAL  Final  Expectorated Sputum Assessment w Gram Stain, Rflx to Resp Cult     Status: None   Collection Time: 04/20/21  5:28 AM   Specimen: Sputum  Result Value Ref Range Status   Specimen Description SPUTUM  Final   Special Requests NONE  Final   Sputum evaluation   Final    THIS SPECIMEN IS ACCEPTABLE FOR SPUTUM CULTURE Performed at [facility], [address] [place], [place] [address]    Report Status 04/20/2021 FINAL  Final  Culture, Respiratory w Gram Stain     Status: None   Collection Time: 04/20/21  5:28 AM   Specimen: SPU  Result Value Ref Range Status   Specimen Description SPUTUM  Final   Special Requests NONE Reflexed from [id number]  Final   Gram Stain   Final    ABUNDANT WBC PRESENT, PREDOMINANTLY PMN FEW GRAM  POSITIVE COCCI Performed at [facility], [address] [place], [place] [address]    Culture   Final    ABUNDANT METHICILLIN RESISTANT STAPHYLOCOCCUS AUREUS   Report Status 04/22/2021 FINAL  Final   Organism ID, Bacteria METHICILLIN RESISTANT STAPHYLOCOCCUS AUREUS  Final      Susceptibility   Methicillin resistant staphylococcus aureus - MIC*    CIPROFLOXACIN >=8 RESISTANT Resistant     ERYTHROMYCIN >=8 RESISTANT Resistant     GENTAMICIN <=0.5 SENSITIVE Sensitive     OXACILLIN >=4 RESISTANT Resistant     TETRACYCLINE <=1 SENSITIVE Sensitive     VANCOMYCIN 1 SENSITIVE Sensitive     TRIMETH/SULFA <=10 SENSITIVE Sensitive     CLINDAMYCIN <=0.25 SENSITIVE Sensitive     RIFAMPIN <=0.5 SENSITIVE Sensitive     Inducible Clindamycin NEGATIVE Sensitive     * ABUNDANT METHICILLIN RESISTANT STAPHYLOCOCCUS AUREUS  Labs: BNP (last 3 results) No results for input(s): BNP in the last 8760 hours. Basic Metabolic Panel: Recent Labs  Lab 04/19/21 0828 04/20/21 0052 04/23/21 0500  NA 132* 131* 134*  K 3.9 3.6 4.3  CL 97* 96* 93*  CO2 29 29 34*  GLUCOSE 108* 142* 99  BUN CREATININE 0.85 0.77 0.70  CALCIUM 8.6* 8.4* 8.9   Liver Function Tests: No results for input(s): AST, ALT, ALKPHOS, BILITOT, PROT, ALBUMIN in the last 168 hours. No results for input(s): LIPASE, AMYLASE in the last 168 hours. No results for input(s): AMMONIA in the last 168 hours. CBC: Recent Labs  Lab 04/19/21 0828 04/20/21 0052 04/23/21 0500  WBC 7.3 5.7 4.7  NEUTROABS 4.3  --   --   HGB 9.0* 8.4* 8.8*  HCT 28.0* 26.1* 26.9*  MCV 91.2 90.3 90.0  PLT 321 285 245   Cardiac Enzymes: No results for input(s): CKTOTAL, CKMB, CKMBINDEX, TROPONINI in the last 168 hours. BNP: Invalid input(s): POCBNP CBG: No results for input(s): GLUCAP in the last 168 hours. D-Dimer No results for input(s): DDIMER in the last 72 hours. Hgb A1c No results for input(s): HGBA1C in the last 72 hours. Lipid  Profile No results for input(s): CHOL, HDL, LDLCALC, TRIG, CHOLHDL, LDLDIRECT in the last 72 hours. Thyroid function studies No results for input(s): TSH, T4TOTAL, T3FREE, THYROIDAB in the last 72 hours.  Invalid input(s): FREET3 Anemia work up No results for input(s): VITAMINB12, FOLATE, FERRITIN, TIBC, IRON, RETICCTPCT in the last 72 hours. Urinalysis    Component Value Date/Time   COLORURINE YELLOW 04/19/2021 1546   APPEARANCEUR HAZY (A) 04/19/2021 1546   LABSPEC 1.021 04/19/2021 1546   PHURINE 5.0 04/19/2021 1546   GLUCOSEU NEGATIVE 04/19/2021 1546   HGBUR LARGE (A) 04/19/2021 1546   BILIRUBINUR NEGATIVE 04/19/2021 1546   KETONESUR NEGATIVE 04/19/2021 1546   PROTEINUR 30 (A) 04/19/2021 1546   UROBILINOGEN 1.0 02/09/2010 2121   NITRITE NEGATIVE 04/19/2021 1546   LEUKOCYTESUR NEGATIVE 04/19/2021 1546   Sepsis Labs Invalid input(s): PROCALCITONIN,  WBC,  LACTICIDVEN Microbiology Recent Results (from the past 240 hour(s))  Culture, blood (routine x 2)     Status: None   Collection Time: 04/19/21  6:40 AM   Specimen: BLOOD RIGHT HAND  Result Value Ref Range Status   Specimen Description BLOOD RIGHT HAND  Final   Special Requests   Final    BOTTLES DRAWN AEROBIC AND ANAEROBIC Blood Culture adequate volume   Culture   Final    NO GROWTH 5 DAYS Performed at [facility], [address], [place], [place] [address]    Report Status 04/24/2021 FINAL  Final  Culture, blood (routine x 2)     Status: None   Collection Time: 04/19/21  6:41 AM   Specimen: BLOOD LEFT HAND  Result Value Ref Range Status   Specimen Description BLOOD LEFT HAND  Final   Special Requests   Final    BOTTLES DRAWN AEROBIC AND ANAEROBIC Blood Culture adequate volume   Culture   Final    NO GROWTH 5 DAYS Performed at [facility], [address], [place], [place] [address]    Report Status 04/24/2021 FINAL  Final  Expectorated Sputum Assessment w Gram Stain, Rflx to Resp Cult     Status:  None   Collection Time: 04/20/21  5:28 AM   Specimen: Sputum  Result Value Ref Range Status   Specimen Description SPUTUM  Final  Special Requests NONE  Final   Sputum evaluation   Final    THIS SPECIMEN IS ACCEPTABLE FOR SPUTUM CULTURE Performed at [facility], [address], [place], [place] [address]    Report Status 04/20/2021 FINAL  Final  Culture, Respiratory w Gram Stain     Status: None   Collection Time: 04/20/21  5:28 AM   Specimen: SPU  Result Value Ref Range Status   Specimen Description SPUTUM  Final   Special Requests NONE Reflexed from [id number]  Final   Gram Stain   Final    ABUNDANT WBC PRESENT, PREDOMINANTLY PMN FEW GRAM POSITIVE COCCI Performed at [facility], [address], [place], [place] [address]    Culture   Final    ABUNDANT METHICILLIN RESISTANT STAPHYLOCOCCUS AUREUS   Report Status 04/22/2021 FINAL  Final   Organism ID, Bacteria METHICILLIN RESISTANT STAPHYLOCOCCUS AUREUS  Final      Susceptibility   Methicillin resistant staphylococcus aureus - MIC*    CIPROFLOXACIN >=8 RESISTANT Resistant     ERYTHROMYCIN >=8 RESISTANT Resistant     GENTAMICIN <=0.5 SENSITIVE Sensitive     OXACILLIN >=4 RESISTANT Resistant     TETRACYCLINE <=1 SENSITIVE Sensitive     VANCOMYCIN 1 SENSITIVE Sensitive     TRIMETH/SULFA <=10 SENSITIVE Sensitive     CLINDAMYCIN <=0.25 SENSITIVE Sensitive     RIFAMPIN <=0.5 SENSITIVE Sensitive     Inducible Clindamycin NEGATIVE Sensitive     * ABUNDANT METHICILLIN RESISTANT STAPHYLOCOCCUS AUREUS     Time coordinating discharge:  SIGNED:   Kathlen Mody, MD  [facility] 04/25/2021, 5:28 PM

## 2021-04-26 NOTE — Care Management (Signed)
04-26-21 1458 Case Manager received call from [place], the clinical supervisor at [facility] The patient never showed up for his intake appointment on 04-26-21. Case Manager made the provider aware. No further needs.

## 2021-04-29 ENCOUNTER — Inpatient Hospital Stay: Payer: Self-pay | Admitting: Family

## 2021-04-29 LAB — FUNGUS CULTURE WITH STAIN

## 2021-04-29 LAB — FUNGUS CULTURE RESULT

## 2021-04-29 LAB — FUNGAL ORGANISM REFLEX

## 2021-05-06 ENCOUNTER — Inpatient Hospital Stay: Payer: Self-pay | Admitting: Internal Medicine

## 2021-05-06 NOTE — Progress Notes (Deleted)
[facility]  Reason for Consult:Endocarditis  Referring Provider: Hospital f/u   HPI:    Duane Price is a 35 y.o. male with PMHx as below who presents to the clinic for hospital follow up endocarditis.   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  Blood cultures cleared as of 7/7.  He was treated for 4 weeks of vancomycin through hospital discharge prior to being converted by my partner, Dr Renold Don, to Linezolid on 8/1 for an additional 2 weeks to complete his 6 weeks of therapy.  He presents today for follow up and reports ***.   Prior to discharge, he had a repeat TTE on 8/1 that showed TV vegetation measuring 1.2x0.8cm, smaller that prior Echo.    Patient's Medications  New Prescriptions   No medications on file  Previous Medications   APIXABAN (ELIQUIS) 5 MG TABS TABLET    Take 1 tablet (5 mg total) by mouth 2 (two) times daily.   FEEDING SUPPLEMENT (ENSURE ENLIVE / ENSURE PLUS) LIQD    Take 237 mLs by mouth 2 (two) times daily between meals.   HYDROXYZINE (ATARAX/VISTARIL) 25 MG TABLET    Take 1 tablet (25 mg total) by mouth 3 (three) times daily as needed for anxiety.   LINEZOLID (ZYVOX) 600 MG TABLET    Take 1 tablet (600 mg total) by mouth 2 (two) times daily for 16 days. Start on 04/25/21.   MELATONIN 3 MG TABS TABLET    Take 1 tablet (3 mg total) by mouth at bedtime.   PANTOPRAZOLE (PROTONIX) 40 MG TABLET    Take 1 tablet (40 mg total) by mouth daily.  Modified Medications   No medications on file  Discontinued Medications   No medications on file      No past medical history on file.  Social History   Tobacco Use   Smoking status: Every Day    Packs/day: 0.50    Types: Cigarettes   Smokeless tobacco: Never  Substance Use Topics   Alcohol use: Yes   Drug use: Yes    Types: Cocaine, IV    Comment: Heroin    Family History  Family history  unknown: Yes    No Known Allergies  ROS    OBJECTIVE:    There were no vitals filed for this visit.   There is no height or weight on file to calculate BMI.  Physical Exam   Labs and Microbiology:  CBC Latest Ref Rng & Units 04/23/2021 04/20/2021 04/19/2021  WBC 4.0 - 10.5 K/uL 4.7 5.7 7.3  Hemoglobin 13.0 - 17.0 g/dL 1.6(R) 6.7(E) 9.3(Y)  Hematocrit 39.0 - 52.0 % 26.9(L) 26.1(L) 28.0(L)  Platelets 150 - 400 K/uL 245 285 321   CMP Latest Ref Rng & Units 04/23/2021 04/20/2021 04/19/2021  Glucose 70 - 99 mg/dL 99 101(B) 510(C)  BUN 6 - 20 mg/dL 10 14 14   Creatinine 0.61 - 1.24 mg/dL 5.85 2.77  Sodium 135 - 145 mmol/L 134(L) 131(L) 132(L)  Potassium 3.5 - 5.1 mmol/L 4.3 3.6 3.9  Chloride 98 - 111 mmol/L 93(L) 96(L) 97(L)  CO2 22 - 32 mmol/L 34(H) 29 29  Calcium 8.9 - 10.3 mg/dL 8.9 8.24) 2.3(N)  Total Protein 6.5 - 8.1 g/dL - - -  Total Bilirubin 0.3 - 1.2 mg/dL - - -  Alkaline Phos 38 - 126 U/L - - -  AST 15 - 41 U/L - - -  ALT 0 - 44 U/L - - -     No results found for this or any previous visit (from the past 240 hour(s)).  Imaging: ***   ASSESSMENT & PLAN:    No problem-specific Assessment & Plan notes found for this encounter.   No orders of the defined types were placed in this encounter.    MRSA bacteremia: Pt here today for hospital follow up for TV endocarditis and MRSA bacteremia with pulmonary septic emboli.  S/p Angiovac debulking on 7/5 and blood cx clearance 7/7.  Completed 4 weeks IV vancomycin inpatient then transitioned to oral linezolid x 2 weeks to complete therapy.  He will complete his oral antibiotics and then will stop therapy.  Check CBC, BMP, and repeat blood cx today.  RTC PRN.  Vedia Coffer for Infectious Disease [facility] 05/06/2021, 6:29 AM

## 2021-05-10 ENCOUNTER — Other Ambulatory Visit (HOSPITAL_COMMUNITY): Payer: Self-pay

## 2021-05-12 LAB — ACID FAST CULTURE WITH REFLEXED SENSITIVITIES (MYCOBACTERIA): Acid Fast Culture: NEGATIVE

## 2021-05-13 ENCOUNTER — Encounter (HOSPITAL_COMMUNITY): Payer: Self-pay | Admitting: Emergency Medicine

## 2021-05-13 ENCOUNTER — Inpatient Hospital Stay (HOSPITAL_COMMUNITY)
Admission: EM | Admit: 2021-05-13 | Discharge: 2021-05-30 | DRG: 539 | Disposition: A | Discharge status: Home / Self Care | Payer: Self-pay | Attending: Internal Medicine | Admitting: Internal Medicine

## 2021-05-13 ENCOUNTER — Other Ambulatory Visit: Payer: Self-pay

## 2021-05-13 ENCOUNTER — Emergency Department (HOSPITAL_COMMUNITY): Payer: Self-pay

## 2021-05-13 DIAGNOSIS — M4635 Infection of intervertebral disc (pyogenic), thoracolumbar region: Principal | ICD-10-CM | POA: Diagnosis present

## 2021-05-13 DIAGNOSIS — Z86711 Personal history of pulmonary embolism: Secondary | ICD-10-CM

## 2021-05-13 DIAGNOSIS — R768 Other specified abnormal immunological findings in serum: Secondary | ICD-10-CM

## 2021-05-13 DIAGNOSIS — E222 Syndrome of inappropriate secretion of antidiuretic hormone: Secondary | ICD-10-CM | POA: Diagnosis present

## 2021-05-13 DIAGNOSIS — D649 Anemia, unspecified: Secondary | ICD-10-CM

## 2021-05-13 DIAGNOSIS — Z28310 Unvaccinated for covid-19: Secondary | ICD-10-CM

## 2021-05-13 DIAGNOSIS — Z91138 Patient's unintentional underdosing of medication regimen for other reason: Secondary | ICD-10-CM

## 2021-05-13 DIAGNOSIS — M7989 Other specified soft tissue disorders: Secondary | ICD-10-CM

## 2021-05-13 DIAGNOSIS — M869 Osteomyelitis, unspecified: Secondary | ICD-10-CM

## 2021-05-13 DIAGNOSIS — U071 COVID-19: Secondary | ICD-10-CM | POA: Diagnosis present

## 2021-05-13 DIAGNOSIS — T368X6A Underdosing of other systemic antibiotics, initial encounter: Secondary | ICD-10-CM | POA: Diagnosis present

## 2021-05-13 DIAGNOSIS — Z5900 Homelessness unspecified: Secondary | ICD-10-CM

## 2021-05-13 DIAGNOSIS — R7881 Bacteremia: Secondary | ICD-10-CM | POA: Diagnosis present

## 2021-05-13 DIAGNOSIS — B9562 Methicillin resistant Staphylococcus aureus infection as the cause of diseases classified elsewhere: Secondary | ICD-10-CM | POA: Diagnosis present

## 2021-05-13 DIAGNOSIS — F1410 Cocaine abuse, uncomplicated: Secondary | ICD-10-CM | POA: Diagnosis present

## 2021-05-13 DIAGNOSIS — M4645 Discitis, unspecified, thoracolumbar region: Secondary | ICD-10-CM | POA: Diagnosis present

## 2021-05-13 DIAGNOSIS — F17210 Nicotine dependence, cigarettes, uncomplicated: Secondary | ICD-10-CM | POA: Diagnosis present

## 2021-05-13 DIAGNOSIS — F1120 Opioid dependence, uncomplicated: Secondary | ICD-10-CM | POA: Diagnosis present

## 2021-05-13 DIAGNOSIS — F1910 Other psychoactive substance abuse, uncomplicated: Secondary | ICD-10-CM

## 2021-05-13 DIAGNOSIS — Z87898 Personal history of other specified conditions: Secondary | ICD-10-CM

## 2021-05-13 DIAGNOSIS — D72829 Elevated white blood cell count, unspecified: Secondary | ICD-10-CM

## 2021-05-13 DIAGNOSIS — I079 Rheumatic tricuspid valve disease, unspecified: Secondary | ICD-10-CM

## 2021-05-13 DIAGNOSIS — M4625 Osteomyelitis of vertebra, thoracolumbar region: Secondary | ICD-10-CM | POA: Diagnosis present

## 2021-05-13 DIAGNOSIS — K219 Gastro-esophageal reflux disease without esophagitis: Secondary | ICD-10-CM | POA: Diagnosis present

## 2021-05-13 HISTORY — DX: Nicotine dependence, unspecified, uncomplicated: F17.200

## 2021-05-13 HISTORY — DX: Other psychoactive substance use, unspecified, uncomplicated: F19.90

## 2021-05-13 HISTORY — DX: Methicillin resistant Staphylococcus aureus infection as the cause of diseases classified elsewhere: B95.62

## 2021-05-13 HISTORY — DX: Bacteremia: R78.81

## 2021-05-13 LAB — BLOOD CULTURE ID PANEL (REFLEXED) - BCID2

## 2021-05-13 LAB — CBC WITH DIFFERENTIAL/PLATELET
Abs Immature Granulocytes: 0.06 10*3/uL (ref 0.00–0.07)
Basophils Absolute: 0 10*3/uL (ref 0.0–0.1)
Basophils Relative: 0 %
Eosinophils Absolute: 0 10*3/uL (ref 0.0–0.5)
Eosinophils Relative: 0 %
HCT: 28.1 % — ABNORMAL LOW (ref 39.0–52.0)
Hemoglobin: 8.9 g/dL — ABNORMAL LOW (ref 13.0–17.0)
Immature Granulocytes: 1 %
Lymphocytes Relative: 7 %
Lymphs Abs: 0.9 10*3/uL (ref 0.7–4.0)
MCH: 28.7 pg (ref 26.0–34.0)
MCHC: 31.7 g/dL (ref 30.0–36.0)
MCV: 90.6 fL (ref 80.0–100.0)
Monocytes Absolute: 0.7 10*3/uL (ref 0.1–1.0)
Monocytes Relative: 6 %
Neutro Abs: 10.2 10*3/uL — ABNORMAL HIGH (ref 1.7–7.7)
Neutrophils Relative %: 86 %
Platelets: 268 10*3/uL (ref 150–400)
RBC: 3.1 MIL/uL — ABNORMAL LOW (ref 4.22–5.81)
RDW: 15.2 % (ref 11.5–15.5)
WBC: 11.8 10*3/uL — ABNORMAL HIGH (ref 4.0–10.5)
nRBC: 0 % (ref 0.0–0.2)

## 2021-05-13 LAB — BASIC METABOLIC PANEL
Anion gap: 7 (ref 5–15)
BUN: <5 mg/dL — ABNORMAL LOW (ref 6–20)
CO2: 29 mmol/L (ref 22–32)
Calcium: 8.8 mg/dL — ABNORMAL LOW (ref 8.9–10.3)
Chloride: 98 mmol/L (ref 98–111)
Creatinine, Ser: 0.66 mg/dL (ref 0.61–1.24)
GFR, Estimated: >60 mL/min (ref >60)
Glucose, Bld: 152 mg/dL — ABNORMAL HIGH (ref 70–99)
Potassium: 3.9 mmol/L (ref 3.5–5.1)
Sodium: 134 mmol/L — ABNORMAL LOW (ref 135–145)

## 2021-05-13 LAB — RESP PANEL BY RT-PCR (FLU A&B, COVID) ARPGX2
Influenza A by PCR: NEGATIVE
Influenza B by PCR: NEGATIVE
SARS Coronavirus 2 by RT PCR: POSITIVE — AB

## 2021-05-13 LAB — SEDIMENTATION RATE: Sed Rate: 95 mm/hr — ABNORMAL HIGH (ref 0–16)

## 2021-05-13 LAB — LACTIC ACID, PLASMA: Lactic Acid, Venous: 0.8 mmol/L (ref 0.5–1.9)

## 2021-05-13 LAB — C-REACTIVE PROTEIN: CRP: 12.2 mg/dL — ABNORMAL HIGH (ref <1.0)

## 2021-05-13 IMAGING — MR MR THORACIC SPINE WO/W CM
11 of 22 series · 14 of 48 positions shown · IV contrast (gadavist)
Comparison: MRI thoracic spine [DATE].

CLINICAL DATA: Mid back pain, bacteremia, IV drug abuse

EXAM:
MRI THORACIC AND LUMBAR SPINE WITHOUT AND WITH CONTRAST
TECHNIQUE: Multiplanar and multiecho pulse sequences of the thoracic and lumbar
spine were obtained without and with intravenous contrast.
CONTRAST:  6.5mL GADAVIST GADOBUTROL 1 MMOL/ML IV SOLN

[Series 16: T1 · sagittal · 3.0mm · 0.62mm/px · 1 of 9 slices shown (1 of 5)]
[im 1/9]
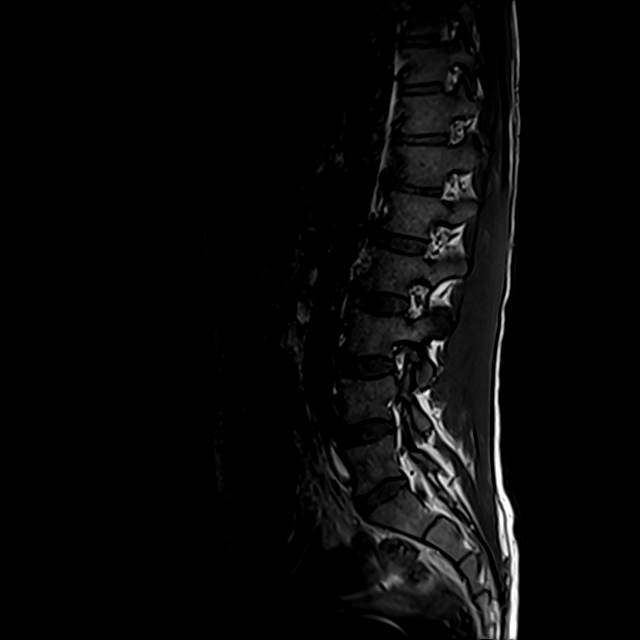

[Series 17: T1 · sagittal · 3.0mm · 0.62mm/px · 1 of 9 slices shown (2 of 5)]
[im 1/9]
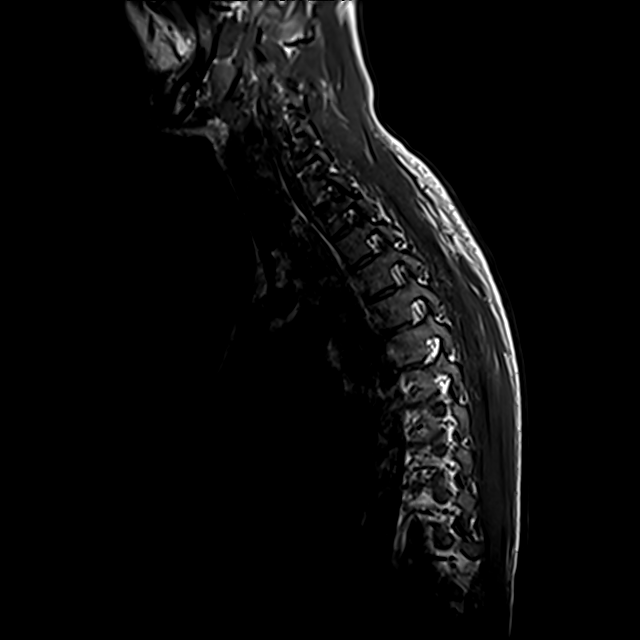

[Series 18: T1 · sagittal · 3.3mm · 0.62mm/px · 1 of 7 slices shown (3 of 5)]
[im 1/7]
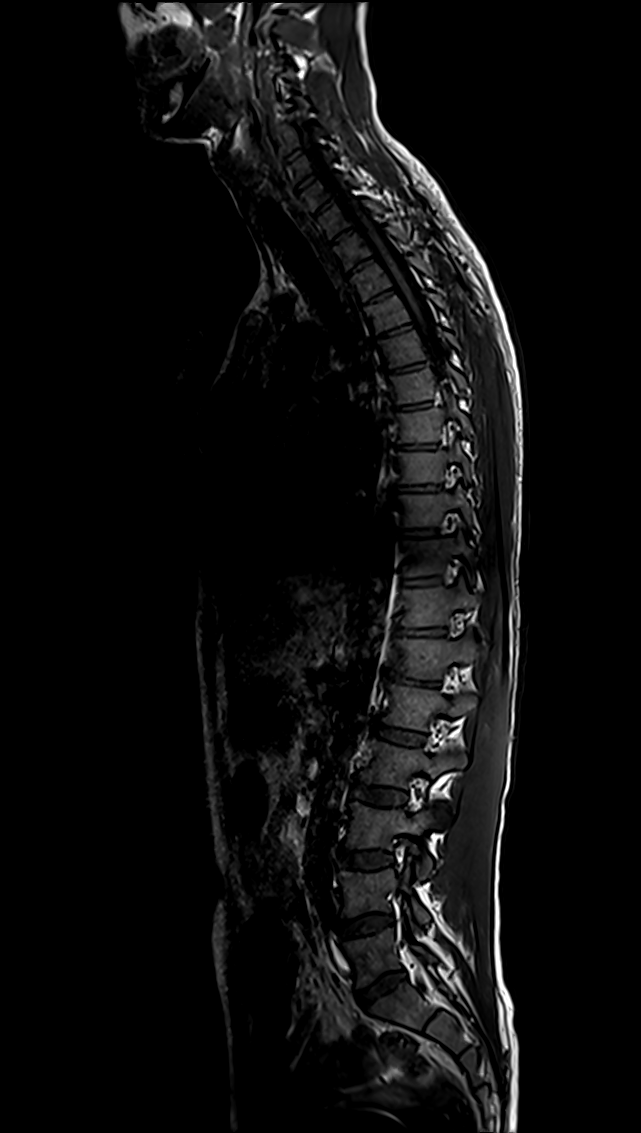

[Series 19: T2 · sagittal · 3.0mm · 0.76mm/px · 1 of 19 slices shown (1 of 2)]
[im 1/19]
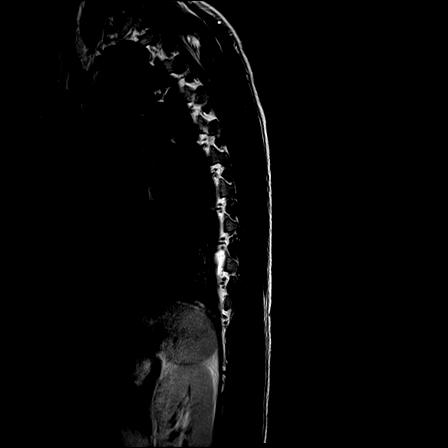

[Series 20: T1 · sagittal · 3.0mm · 0.76mm/px · 1 of 19 slices shown (4 of 5)]
[im 1/19]
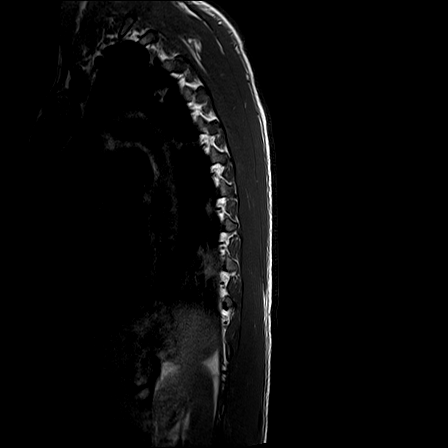

[Series 21: STIR · sagittal · 3.0mm · 0.38mm/px · 1 of 19 slices shown]
[im 1/19]
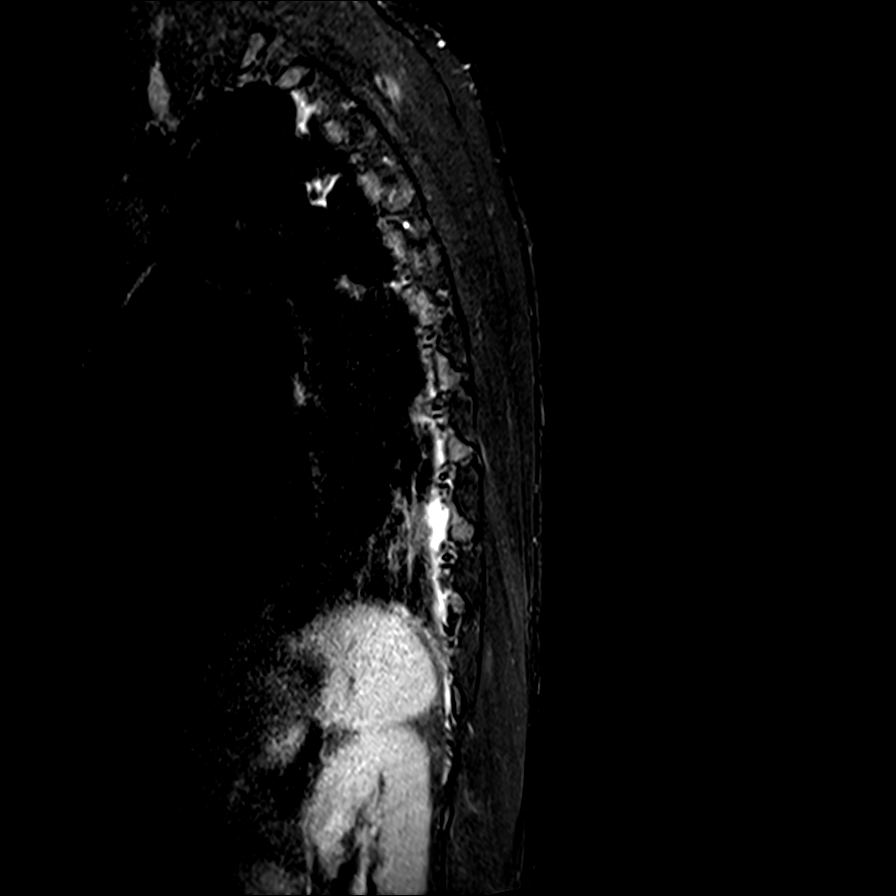

[Series 22: T2 · axial · 5.0mm · 0.59mm/px · z∈[-198,+57]mm · 2 of 39 slices shown (2 of 2)]
[im 1/39]
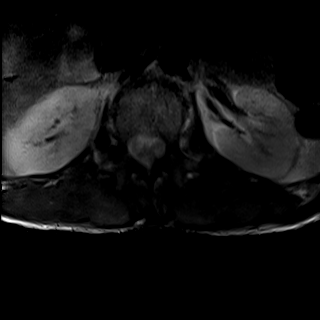
[im 39/39]
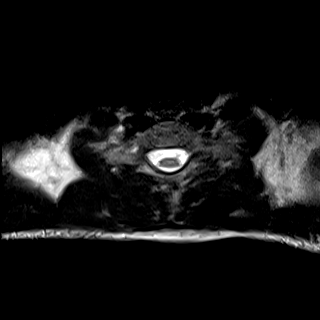

[Series 23: t2_me2d_tra · axial · 5.0mm · 0.37mm/px · 1 of 39 slices shown]
[im 1/39]
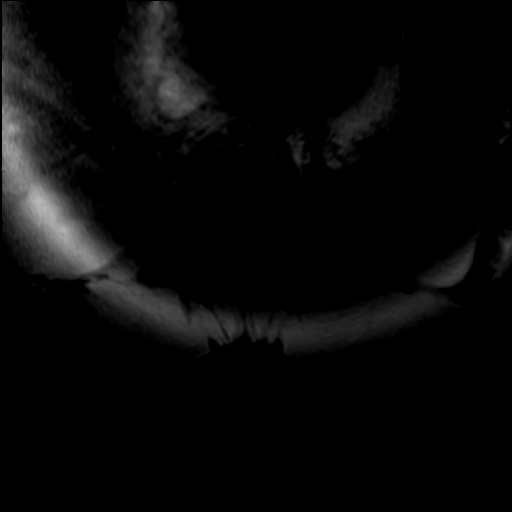

[Series 24: T1 · axial · non-contrast · 5.0mm · 0.31mm/px · z∈[-198,+57]mm · 2 of 39 slices shown (5 of 5)]
[im 1/39]
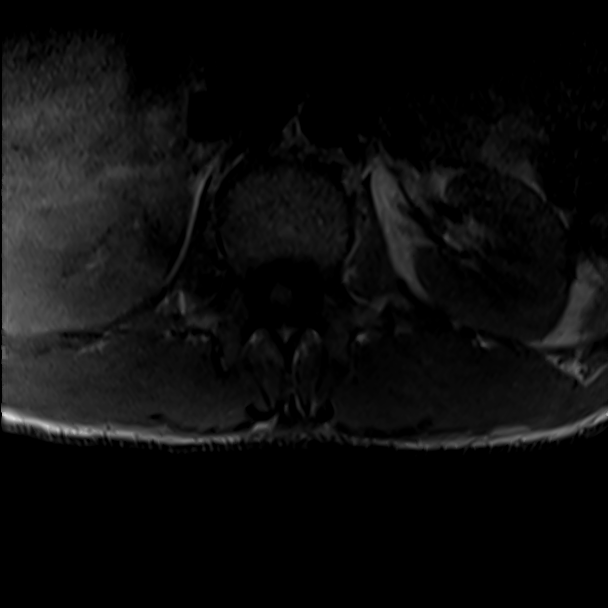
[im 39/39]
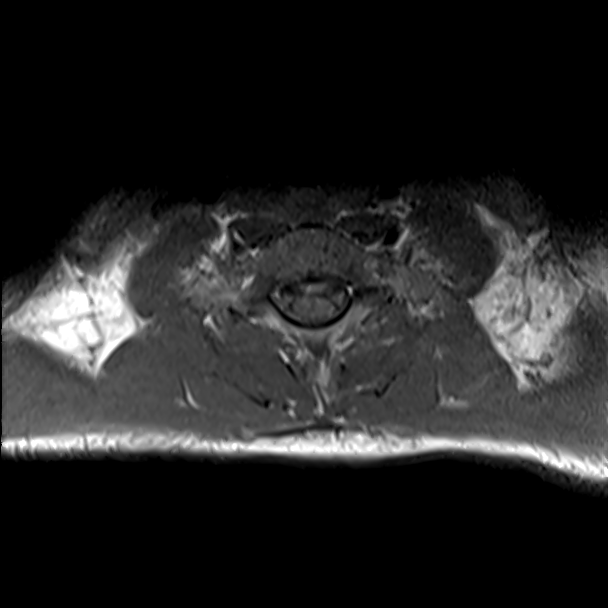

[Series 25: T1 fat-sat post-contrast · sagittal · 3.0mm · 0.76mm/px · 1 of 19 slices shown]
[im 1/19]
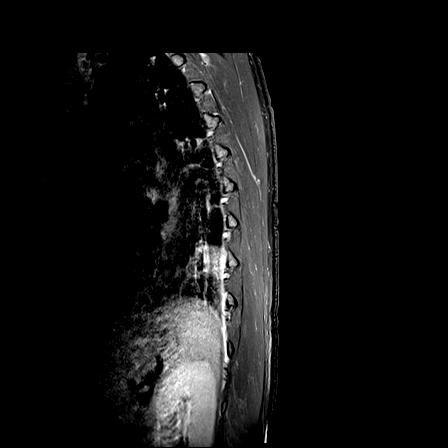

[Series 26: T1 post-contrast · axial · 5.0mm · 0.31mm/px · z∈[-198,+57]mm · 2 of 39 slices shown]
[im 1/39]
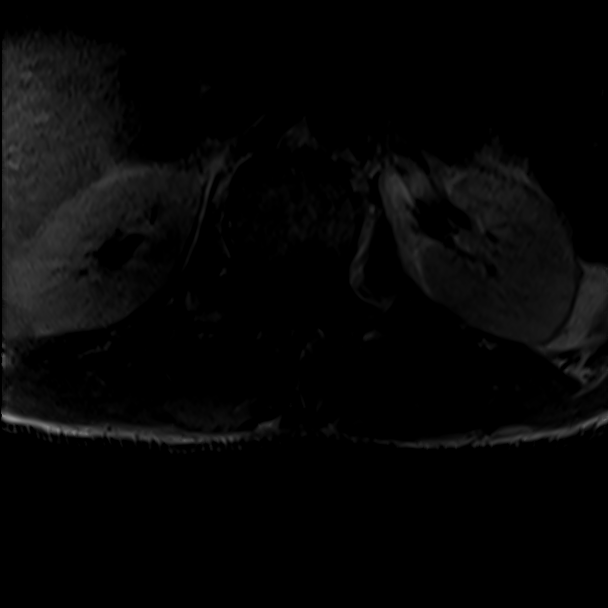
[im 39/39]
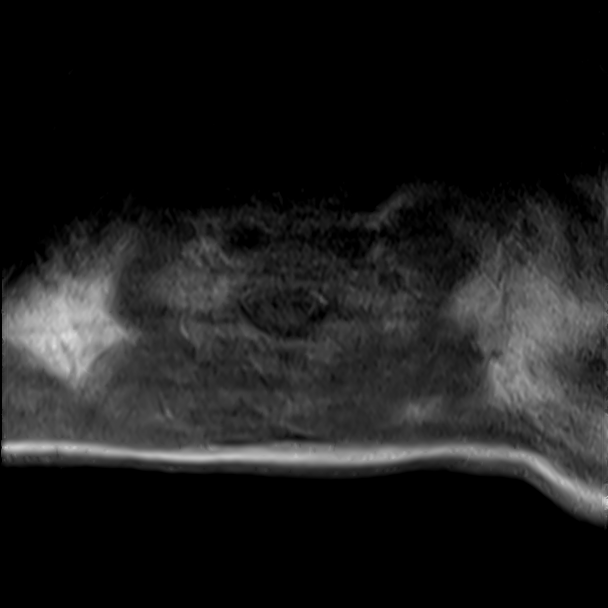

[14 of 48 positions shown; findings below may reference images not displayed]

FINDINGS: MRI THORACIC SPINE FINDINGS

Alignment:  Physiologic.

Vertebrae: No fracture, evidence of discitis, or bone lesion.

Cord:  Normal signal and morphology.

Paraspinal and other soft tissues: Negative.

Disc levels:

Disc spaces:  Degenerative disease with disc height loss at T8-9.

T1-T2: No disc protrusion, foraminal stenosis or central canal
stenosis.

T2-T3: No disc protrusion, foraminal stenosis or central canal
stenosis.

T3-T4: No disc protrusion, foraminal stenosis or central canal
stenosis.

T4-T5: No disc protrusion, foraminal stenosis or central canal
stenosis.

T5-T6: No disc protrusion, foraminal stenosis or central canal
stenosis.

T6-T7: No disc protrusion, foraminal stenosis or central canal
stenosis.

T7-T8: No disc protrusion, foraminal stenosis or central canal
stenosis.

T8-T9: Small right paracentral disc protrusion. No foraminal or
central canal stenosis.

T9-T10: No disc protrusion, foraminal stenosis or central canal
stenosis.

T10-T11: No disc protrusion, foraminal stenosis or central canal
stenosis.

T11-T12: Small left paracentral disc protrusion. No foraminal or
central canal stenosis.

MRI LUMBAR SPINE FINDINGS

Segmentation:  Standard.

Alignment:  Physiologic.

Vertebrae: No acute fracture. No aggressive osseous lesion. Disc
height loss at T12-L1 with minimal fluid signal in the disc space
and increase bone marrow edema in the adjacent vertebral body
endplates compared with MRI of the thoracic spine dated [DATE],
concerning for early discitis-osteomyelitis. No epidural fluid
collection to suggest an abscess. No paravertebral fluid collection.

Conus medullaris: Extends to the L1 level and appears normal.

Paraspinal and other soft tissues: No acute paraspinal abnormality.

Disc levels:

Disc spaces: Degenerative disease with disc height loss T12-L1.

T12-L1: Small central disc protrusion. No foraminal or central canal
stenosis.

L1-L2: No significant disc bulge. No neural foraminal stenosis. No
central canal stenosis.

L2-L3: No significant disc bulge. No neural foraminal stenosis. No
central canal stenosis.

L3-L4: No significant disc bulge. No neural foraminal stenosis. No
central canal stenosis.

L4-L5: No significant disc bulge. No neural foraminal stenosis. No
central canal stenosis.

L5-S1: No significant disc bulge. No neural foraminal stenosis. No
central canal stenosis.
IMPRESSION: 1. Findings concerning for early discitis-osteomyelitis at T12-L1
with increase bone marrow edema in the adjacent vertebral body
endplates compared with recent thoracic spine MRI dated [DATE].
No epidural or paravertebral fluid collection to suggest an abscess.
2. Mild thoracolumbar spondylosis as described above.

## 2021-05-13 IMAGING — MR MR LUMBAR SPINE WO/W CM
4 of 7 series · 23 of 48 positions shown · IV contrast (gadavist)
Comparison: MRI thoracic spine [DATE].

CLINICAL DATA: Mid back pain, bacteremia, IV drug abuse

EXAM:
MRI THORACIC AND LUMBAR SPINE WITHOUT AND WITH CONTRAST
TECHNIQUE: Multiplanar and multiecho pulse sequences of the thoracic and lumbar
spine were obtained without and with intravenous contrast.
CONTRAST:  6.5mL GADAVIST GADOBUTROL 1 MMOL/ML IV SOLN

[Series 1: T2 · sagittal · 4.0mm · 0.78mm/px · 4 of 17 slices shown (1 of 2)]
[im 1/17]
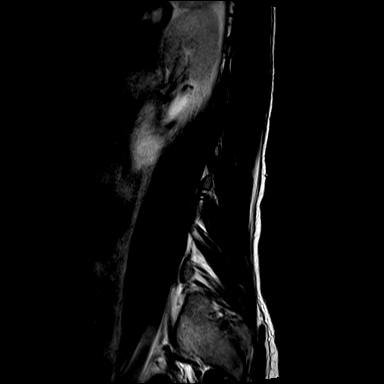
[im 6/17]
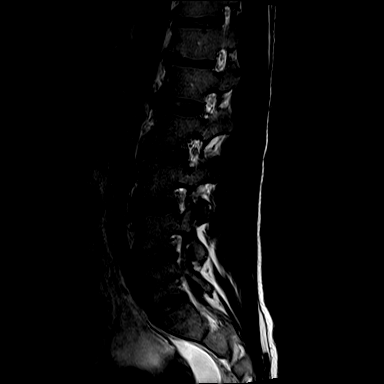
[im 11/17]
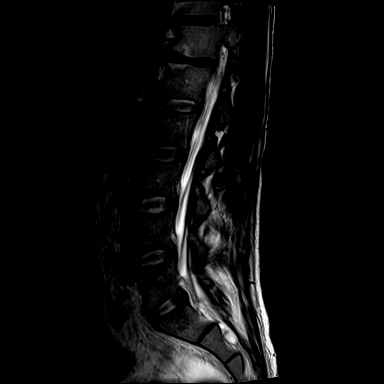
[im 17/17]
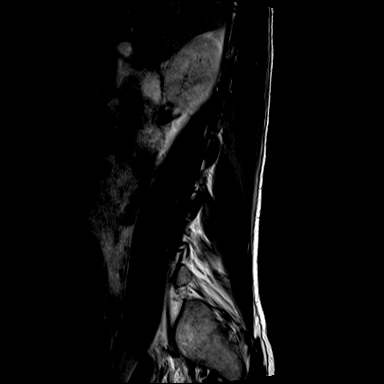

[Series 3: T1 · sagittal · 4.0mm · 0.94mm/px · 5 of 17 slices shown (1 of 2)]
[im 1/17]
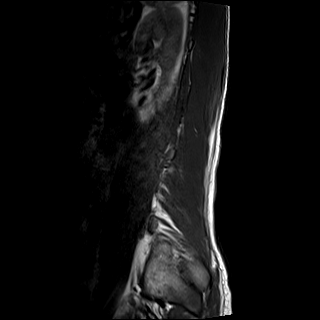
[im 5/17]
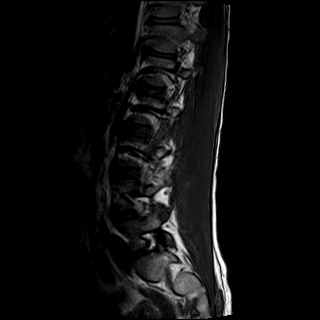
[im 9/17]
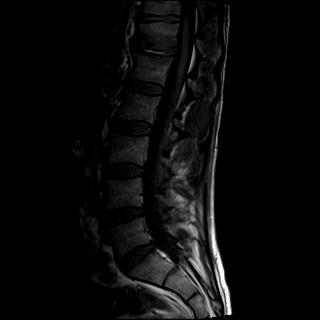
[im 13/17]
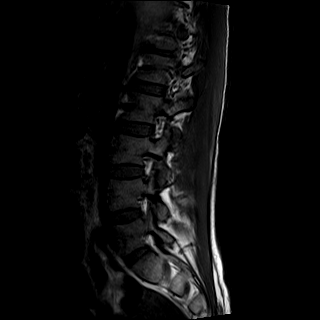
[im 17/17]
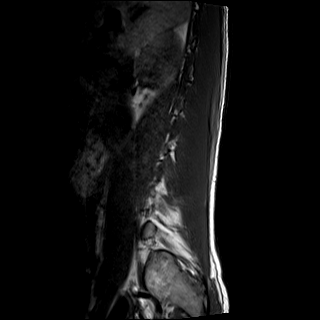

[Series 4: T2 · axial · 5.0mm · 0.57mm/px · z∈[-424,-153]mm · 8 of 34 slices shown (2 of 2)]
[im 1/34]
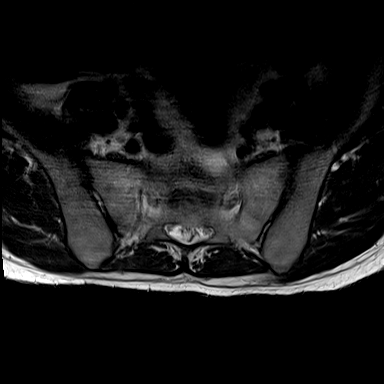
[im 4/34]
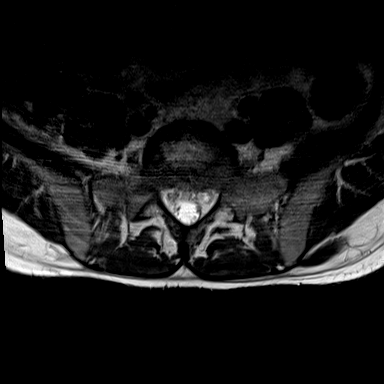
[im 12/34]
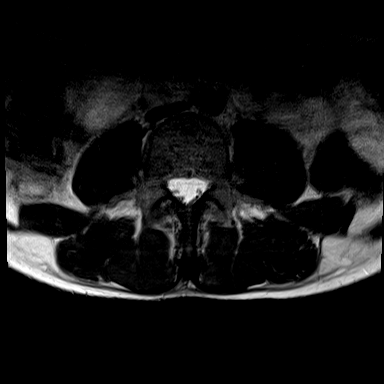
[im 15/34]
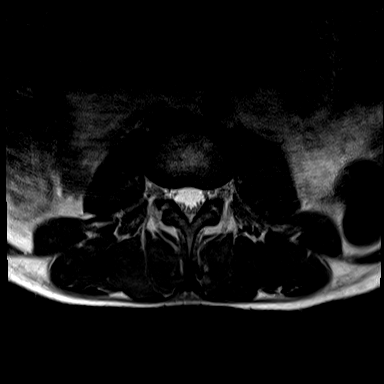
[im 19/34]
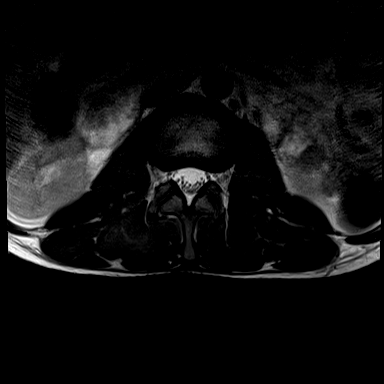
[im 23/34]
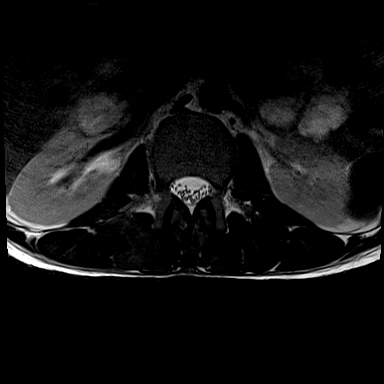
[im 30/34]
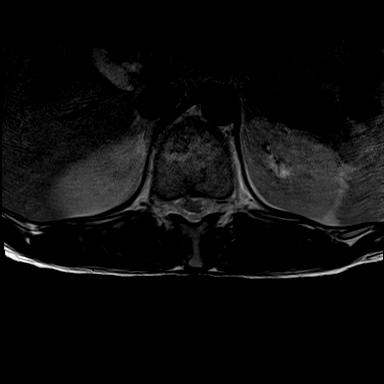
[im 34/34]
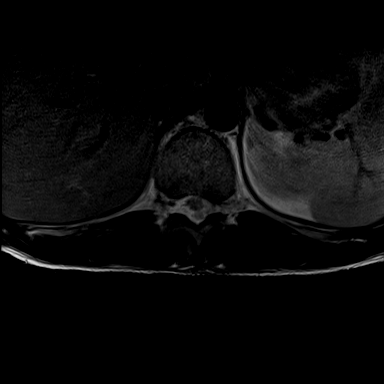

[Series 5: T1 · axial · 5.0mm · 0.34mm/px · z∈[-424,-183]mm · 6 of 34 slices shown (2 of 2)]
[im 1/34]
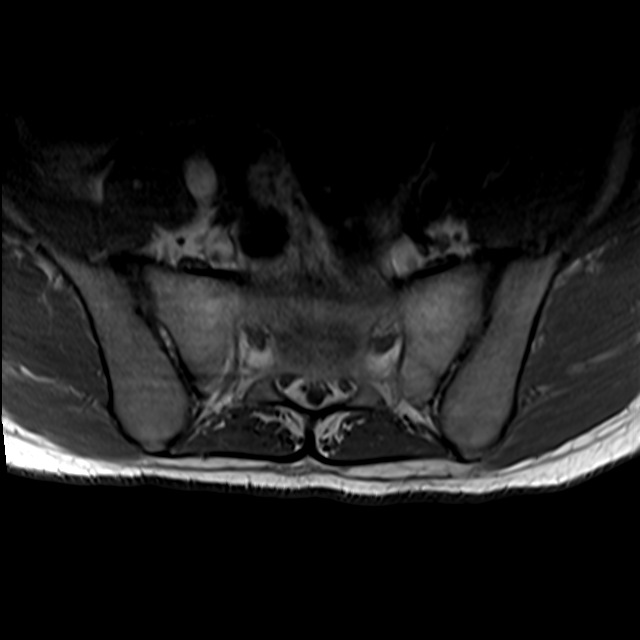
[im 4/34]
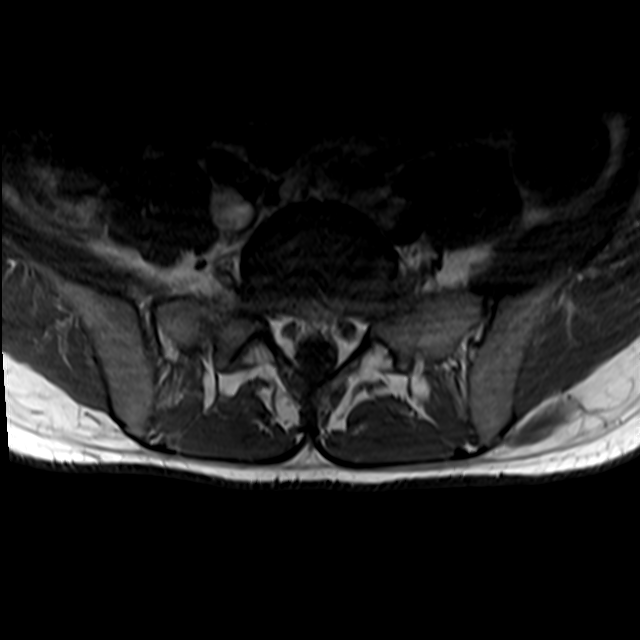
[im 12/34]
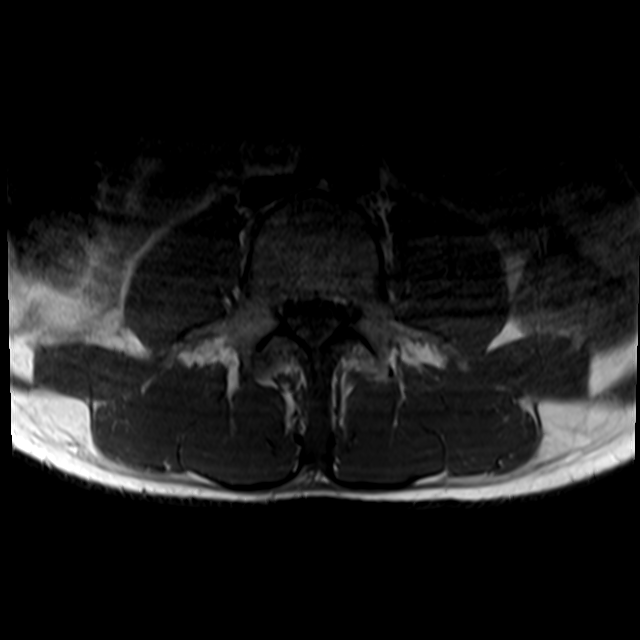
[im 15/34]
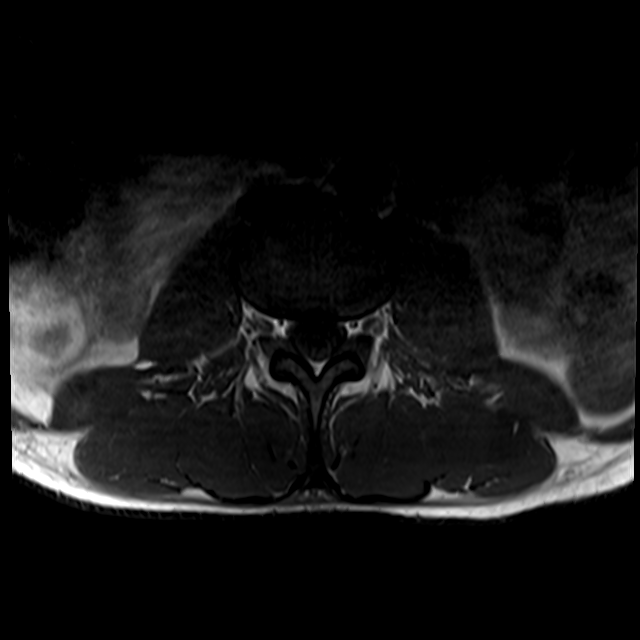
[im 19/34]
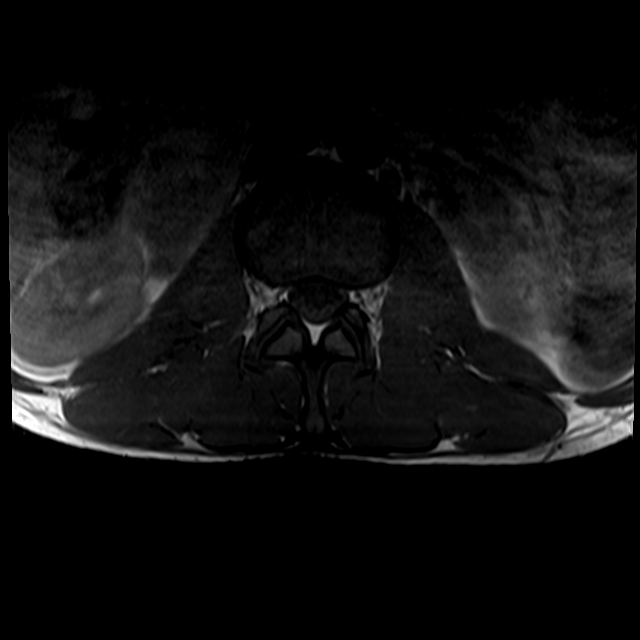
[im 30/34]
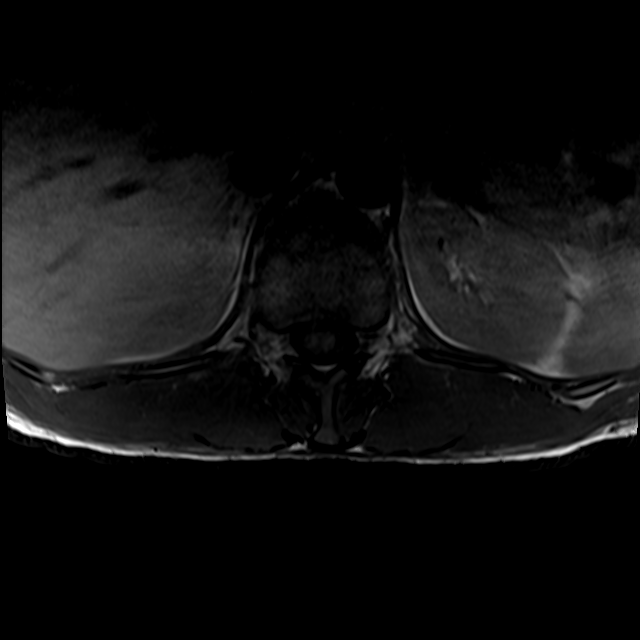

[23 of 48 positions shown; findings below may reference images not displayed]

FINDINGS: MRI THORACIC SPINE FINDINGS

Alignment:  Physiologic.

Vertebrae: No fracture, evidence of discitis, or bone lesion.

Cord:  Normal signal and morphology.

Paraspinal and other soft tissues: Negative.

Disc levels:

Disc spaces:  Degenerative disease with disc height loss at T8-9.

T1-T2: No disc protrusion, foraminal stenosis or central canal
stenosis.

T2-T3: No disc protrusion, foraminal stenosis or central canal
stenosis.

T3-T4: No disc protrusion, foraminal stenosis or central canal
stenosis.

T4-T5: No disc protrusion, foraminal stenosis or central canal
stenosis.

T5-T6: No disc protrusion, foraminal stenosis or central canal
stenosis.

T6-T7: No disc protrusion, foraminal stenosis or central canal
stenosis.

T7-T8: No disc protrusion, foraminal stenosis or central canal
stenosis.

T8-T9: Small right paracentral disc protrusion. No foraminal or
central canal stenosis.

T9-T10: No disc protrusion, foraminal stenosis or central canal
stenosis.

T10-T11: No disc protrusion, foraminal stenosis or central canal
stenosis.

T11-T12: Small left paracentral disc protrusion. No foraminal or
central canal stenosis.

MRI LUMBAR SPINE FINDINGS

Segmentation:  Standard.

Alignment:  Physiologic.

Vertebrae: No acute fracture. No aggressive osseous lesion. Disc
height loss at T12-L1 with minimal fluid signal in the disc space
and increase bone marrow edema in the adjacent vertebral body
endplates compared with MRI of the thoracic spine dated [DATE],
concerning for early discitis-osteomyelitis. No epidural fluid
collection to suggest an abscess. No paravertebral fluid collection.

Conus medullaris: Extends to the L1 level and appears normal.

Paraspinal and other soft tissues: No acute paraspinal abnormality.

Disc levels:

Disc spaces: Degenerative disease with disc height loss T12-L1.

T12-L1: Small central disc protrusion. No foraminal or central canal
stenosis.

L1-L2: No significant disc bulge. No neural foraminal stenosis. No
central canal stenosis.

L2-L3: No significant disc bulge. No neural foraminal stenosis. No
central canal stenosis.

L3-L4: No significant disc bulge. No neural foraminal stenosis. No
central canal stenosis.

L4-L5: No significant disc bulge. No neural foraminal stenosis. No
central canal stenosis.

L5-S1: No significant disc bulge. No neural foraminal stenosis. No
central canal stenosis.
IMPRESSION: 1. Findings concerning for early discitis-osteomyelitis at T12-L1
with increase bone marrow edema in the adjacent vertebral body
endplates compared with recent thoracic spine MRI dated [DATE].
No epidural or paravertebral fluid collection to suggest an abscess.
2. Mild thoracolumbar spondylosis as described above.

## 2021-05-13 MED ORDER — KETOROLAC TROMETHAMINE 15 MG/ML IJ SOLN
15.0000 mg | Freq: Once | INTRAMUSCULAR | Status: AC
  Administered 2021-05-13: 15 mg via INTRAVENOUS
  Filled 2021-05-13: qty 1

## 2021-05-13 MED ORDER — MELATONIN 3 MG PO TABS
3.0000 mg | ORAL_TABLET | Freq: Every day | ORAL | Status: DC
  Administered 2021-05-13 – 2021-05-29 (×17): 3 mg via ORAL
  Filled 2021-05-13 (×17): qty 1

## 2021-05-13 MED ORDER — HYDROXYZINE HCL 25 MG PO TABS: 25.0000 mg | ORAL_TABLET | Freq: Three times a day (TID) | ORAL | Status: DC | PRN

## 2021-05-13 MED ORDER — ACETAMINOPHEN 650 MG RE SUPP: 650.0000 mg | Freq: Four times a day (QID) | RECTAL | Status: DC | PRN

## 2021-05-13 MED ORDER — LIDOCAINE 5 % EX PTCH
1.0000 | MEDICATED_PATCH | CUTANEOUS | Status: DC
  Administered 2021-05-13 – 2021-05-25 (×13): 1 via TRANSDERMAL
  Filled 2021-05-13 (×17): qty 1

## 2021-05-13 MED ORDER — METHADONE HCL 5 MG PO TABS
15.0000 mg | ORAL_TABLET | Freq: Two times a day (BID) | ORAL | Status: DC
  Administered 2021-05-13 (×2): 15 mg via ORAL
  Filled 2021-05-13 (×2): qty 1
  Filled 2021-05-13: qty 2

## 2021-05-13 MED ORDER — GADOBUTROL 1 MMOL/ML IV SOLN
6.5000 mL | Freq: Once | INTRAVENOUS | Status: AC | PRN
  Administered 2021-05-13: 6.5 mL via INTRAVENOUS

## 2021-05-13 MED ORDER — ALBUTEROL SULFATE (2.5 MG/3ML) 0.083% IN NEBU: 2.5000 mg | INHALATION_SOLUTION | Freq: Four times a day (QID) | RESPIRATORY_TRACT | Status: DC | PRN

## 2021-05-13 MED ORDER — APIXABAN 5 MG PO TABS
5.0000 mg | ORAL_TABLET | Freq: Two times a day (BID) | ORAL | Status: DC
  Administered 2021-05-13 – 2021-05-30 (×35): 5 mg via ORAL
  Filled 2021-05-13 (×36): qty 1

## 2021-05-13 MED ORDER — ONDANSETRON HCL 4 MG/2ML IJ SOLN: 4.0000 mg | Freq: Four times a day (QID) | INTRAMUSCULAR | Status: DC | PRN

## 2021-05-13 MED ORDER — PANTOPRAZOLE SODIUM 40 MG PO TBEC
40.0000 mg | DELAYED_RELEASE_TABLET | Freq: Every day | ORAL | Status: DC
  Administered 2021-05-13 – 2021-05-30 (×18): 40 mg via ORAL
  Filled 2021-05-13 (×18): qty 1

## 2021-05-13 MED ORDER — ONDANSETRON HCL 4 MG PO TABS: 4.0000 mg | ORAL_TABLET | Freq: Four times a day (QID) | ORAL | Status: DC | PRN

## 2021-05-13 MED ORDER — VANCOMYCIN HCL 1250 MG/250ML IV SOLN
1250.0000 mg | Freq: Once | INTRAVENOUS | Status: AC
  Administered 2021-05-13: 1250 mg via INTRAVENOUS
  Filled 2021-05-13: qty 250

## 2021-05-13 MED ORDER — NICOTINE 14 MG/24HR TD PT24
14.0000 mg | MEDICATED_PATCH | Freq: Every day | TRANSDERMAL | Status: DC
  Administered 2021-05-13 – 2021-05-30 (×18): 14 mg via TRANSDERMAL
  Filled 2021-05-13 (×18): qty 1

## 2021-05-13 MED ORDER — SODIUM CHLORIDE 0.9% FLUSH
3.0000 mL | Freq: Two times a day (BID) | INTRAVENOUS | Status: DC
  Administered 2021-05-13 – 2021-05-30 (×29): 3 mL via INTRAVENOUS

## 2021-05-13 MED ORDER — ACETAMINOPHEN 500 MG PO TABS
1000.0000 mg | ORAL_TABLET | Freq: Once | ORAL | Status: AC
  Administered 2021-05-13: 1000 mg via ORAL
  Filled 2021-05-13: qty 2

## 2021-05-13 MED ORDER — ENSURE ENLIVE PO LIQD
237.0000 mL | Freq: Two times a day (BID) | ORAL | Status: DC
  Administered 2021-05-13 – 2021-05-30 (×35): 237 mL via ORAL
  Filled 2021-05-13 (×3): qty 237

## 2021-05-13 MED ORDER — ACETAMINOPHEN 325 MG PO TABS
650.0000 mg | ORAL_TABLET | Freq: Four times a day (QID) | ORAL | Status: DC | PRN
  Administered 2021-05-13 – 2021-05-14 (×4): 650 mg via ORAL
  Filled 2021-05-13 (×4): qty 2

## 2021-05-13 MED ORDER — KETAMINE HCL 50 MG/5ML IJ SOSY
15.0000 mg | PREFILLED_SYRINGE | Freq: Once | INTRAMUSCULAR | Status: AC
  Administered 2021-05-13: 15 mg via INTRAVENOUS
  Filled 2021-05-13: qty 5

## 2021-05-13 MED ORDER — VANCOMYCIN HCL IN DEXTROSE 1-5 GM/200ML-% IV SOLN
1000.0000 mg | Freq: Three times a day (TID) | INTRAVENOUS | Status: DC
  Administered 2021-05-13 – 2021-05-16 (×8): 1000 mg via INTRAVENOUS
  Filled 2021-05-13 (×11): qty 200

## 2021-05-13 NOTE — ED Notes (Signed)
IV was attemted x2 by this RN and x2 by Alinda Money, RN. IV team order placed.

## 2021-05-13 NOTE — ED Notes (Signed)
Report given to Christina C, RN  
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
## 2021-05-13 NOTE — Progress Notes (Signed)
[facility] night shift telemetry coverage note.  The nursing staff reported that the patient was having pain in his back rated at 8/10 and was asking for something to help more than acetaminophen.  He did receive Toradol 15 mg IVP earlier today.  However, he is also receiving apixaban with pantoprazole 40 mg p.o. daily GI prophylaxis.  I have ordered another ketorolac 15 mg IVP x1 dose.  The primary team will address regular treatment for pain management in the morning.  Sanda Klein, MD.
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
## 2021-05-13 NOTE — Consult Note (Signed)
[facility]  Total days of antibiotics 1               Reason for Consult: back pain/Thoracolumbar discitis   Referring Physician: Katrinka Blazingsmith  Principal Problem:   Discitis, unspecified, thoracolumbar region Active Problems:   Normocytic anemia   IV drug abuse (HCC)   Endocarditis of tricuspid valve   HCV antibody positive   Osteomyelitis (HCC)   Leukocytosis   History of bacteremia   History of pulmonary embolus (PE)   Homeless   Polysubstance abuse (HCC)    HPI: Duane Price is a 35 y.o. 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 He was discharged on 8/1 with receiving roughly 24 days of IV abtx since source control and given 2 wk of oral linezolid to finish course. He returns to the ED with acute lower back pain. He states that he has some soreness to right dorsum of hand where he has been injecting. In addition, has not taken all of his oral abtx. On admit, he has leukocytosis and his imaging of spine showed T12-L1 early discitis  On admission, he has also tested positive for covid, he denies any respiratory symptoms, denies  nasal congestion or gi symptoms  History reviewed. No pertinent past medical history.  Allergies: No Known Allergies  Current antibiotics: vancomycin   MEDICATIONS:  apixaban  5 mg Oral BID   feeding supplement  237 mL Oral BID BM   lidocaine  1 patch Transdermal Q24H   melatonin  3 mg Oral QHS   methadone  15 mg Oral Q12H   nicotine  14 mg Transdermal Daily   pantoprazole  40 mg Oral Daily   sodium chloride flush  3 mL Intravenous Q12H    Social History   Tobacco Use   Smoking status: Every Day    Packs/day: 0.50    Types: Cigarettes   Smokeless tobacco: Never  Substance Use Topics   Alcohol use: Yes   Drug use: Yes    Types: Cocaine, IV    Comment: Heroin    Family History  Family history  unknown: Yes     Review of Systems  Constitutional: Negative for fever, chills, diaphoresis, activity change, appetite change, fatigue and unexpected weight change.  HENT: Negative for congestion, sore throat, rhinorrhea, sneezing, trouble swallowing and sinus pressure.  Eyes: Negative for photophobia and visual disturbance.  Respiratory: Negative for cough, chest tightness, shortness of breath, wheezing and stridor.  Cardiovascular: Negative for chest pain, palpitations and leg swelling.  Gastrointestinal: Negative for nausea, vomiting, abdominal pain, diarrhea, constipation, blood in stool, abdominal distention and anal bleeding.  Genitourinary: Negative for dysuria, hematuria, flank pain and difficulty urinating.  Musculoskeletal: + back pain. Negative for myalgias, back pain, joint swelling, arthralgias and gait problem.  Skin: Negative for color change, pallor, rash and wound.  Neurological: Negative for dizziness, tremors, weakness and light-headedness.  Hematological: Negative for adenopathy. Does not bruise/bleed easily.  Psychiatric/Behavioral: Negative for behavioral problems, confusion, sleep disturbance, dysphoric mood, decreased concentration and agitation.    OBJECTIVE: Temp:  [97.4 F (36.3 C)-98.5 F (36.9 C)] 97.4 F (36.3 C) (08/19 0651) Pulse Rate:  [54-106] 97 (08/19 1500) Resp:  [10-26] 25 (08/19 1500) BP: (100-127)/(61-107) 109/77 (08/19 1500) SpO2:  [97 %-100 %] 100 % (08/19 1500) Weight:  [70.9 kg] 70.9 kg (08/19 0700) Physical Exam  Constitutional: He is oriented to person, place,  and time. He appears well-developed and well-nourished. No distress.  HENT:  Mouth/Throat: Oropharynx is clear and moist. No oropharyngeal exudate.  Cardiovascular: Normal rate, regular rhythm and normal heart sounds. Exam reveals no gallop and no friction rub.  No murmur heard.  Pulmonary/Chest: Effort normal and breath sounds normal. No respiratory distress. He has no wheezes.   Abdominal: Soft. Bowel sounds are normal. He exhibits no distension. There is no tenderness.  Lymphadenopathy:  He has no cervical adenopathy.  Neurological: He is alert and oriented to person, place, and time.  Skin: Skin is warm and dry. Numerous pinpoint scabs on right wrist. No fluctuance, erythema, or edema. A few healing marks on left arm Psychiatric: He has a normal mood and affect. His behavior is normal.    LABS: Results for orders placed or performed during the hospital encounter of 05/13/21 (from the past 48 hour(s))  CBC with Differential     Status: Abnormal   Collection Time: 05/13/21  3:08 AM  Result Value Ref Range   WBC 11.8 (H) 4.0 - 10.5 K/uL   RBC 3.10 (L) 4.22 - 5.81 MIL/uL   Hemoglobin 8.9 (L) 13.0 - 17.0 g/dL   HCT 50.3 (L) 54.6 - 56.8 %   MCV 90.6 80.0 - 100.0 fL   MCH 28.7 26.0 - 34.0 pg   MCHC 31.7 30.0 - 36.0 g/dL   RDW 12.7 51.7 - 00.1 %   Platelets 268 150 - 400 K/uL   nRBC 0.0 0.0 - 0.2 %   Neutrophils Relative % 86 %   Neutro Abs 10.2 (H) 1.7 - 7.7 K/uL   Lymphocytes Relative 7 %   Lymphs Abs 0.9 0.7 - 4.0 K/uL   Monocytes Relative 6 %   Monocytes Absolute 0.7 0.1 - 1.0 K/uL   Eosinophils Relative 0 %   Eosinophils Absolute 0.0 0.0 - 0.5 K/uL   Basophils Relative 0 %   Basophils Absolute 0.0 0.0 - 0.1 K/uL   Immature Granulocytes 1 %   Abs Immature Granulocytes 0.06 0.00 - 0.07 K/uL    Comment: Performed at [facility], [address] [place], [place] [address]  Basic metabolic panel     Status: Abnormal   Collection Time: 05/13/21  3:08 AM  Result Value Ref Range   Sodium 134 (L) 135 - 145 mmol/L   Potassium 3.9 3.5 - 5.1 mmol/L   Chloride 98 98 - 111 mmol/L   CO2 29 22 - 32 mmol/L   Glucose, Bld 152 (H) 70 - 99 mg/dL    Comment: Glucose reference range applies only to samples taken after fasting for at least 8 hours.   BUN <5 (L) 6 - 20 mg/dL   Creatinine, Ser 9.67 0.61 - 1.24 mg/dL   Calcium 8.8 (L) 8.9 - 10.3 mg/dL   GFR,  Estimated >59 >16 mL/min    Comment: (NOTE) Calculated using the CKD-EPI Creatinine Equation (2021)    Anion gap 7 5 - 15    Comment: Performed at [facility], [address], [place], [place] [address]  Sedimentation rate     Status: Abnormal   Collection Time: 05/13/21  3:08 AM  Result Value Ref Range   Sed Rate 95 (H) 0 - 16 mm/hr    Comment: Performed at [facility], [address] [place], [place] [address]  C-reactive protein     Status: Abnormal   Collection Time: 05/13/21  3:08 AM  Result Value Ref Range  CRP 12.2 (H) <1.0 mg/dL    Comment: Performed at [facility], [address], [place], [place] [address]  Blood culture (routine x 2)     Status: None (Preliminary result)   Collection Time: 05/13/21  3:08 AM   Specimen: BLOOD RIGHT HAND  Result Value Ref Range   Specimen Description BLOOD RIGHT HAND    Special Requests      BOTTLES DRAWN AEROBIC AND ANAEROBIC Blood Culture adequate volume   Culture      NO GROWTH < 12 HOURS Performed at [facility], [address], [place], [place] [address]    Report Status PENDING   Blood culture (routine x 2)     Status: None (Preliminary result)   Collection Time: 05/13/21  3:09 AM   Specimen: BLOOD LEFT HAND  Result Value Ref Range   Specimen Description BLOOD LEFT HAND    Special Requests      BOTTLES DRAWN AEROBIC AND ANAEROBIC Blood Culture adequate volume   Culture      NO GROWTH < 12 HOURS Performed at [facility], [address], [place], [place] [address]    Report Status PENDING   Lactic acid, plasma     Status: None   Collection Time: 05/13/21  3:10 AM  Result Value Ref Range   Lactic Acid, Venous 0.8 0.5 - 1.9 mmol/L    Comment: Performed at [facility], [address] [place], [place] [address]  Resp Panel by RT-PCR (Flu A&B, Covid) Nasopharyngeal Swab     Status: Abnormal   Collection Time: 05/13/21  8:07 AM   Specimen: Nasopharyngeal Swab; Nasopharyngeal(NP) swabs  in vial transport medium  Result Value Ref Range   SARS Coronavirus 2 by RT PCR POSITIVE (A) NEGATIVE    Comment: RESULT CALLED TO, READ BACK BY AND VERIFIED WITH: RN Cherlyn Labella [id number] AT 1016 BY CM (NOTE) SARS-CoV-2 target nucleic acids are DETECTED.  The SARS-CoV-2 RNA is generally detectable in upper respiratory specimens during the acute phase of infection. Positive results are indicative of the presence of the identified virus, but do not rule out bacterial infection or co-infection with other pathogens not detected by the test. Clinical correlation with patient history and other diagnostic information is necessary to determine patient infection status. The expected result is Negative.  Fact Sheet for Patients: BloggerCourse.com  Fact Sheet for Healthcare Providers: SeriousBroker.it  This test is not yet approved or cleared by the [place] FDA and  has been authorized for detection and/or diagnosis of SARS-CoV-2 by FDA under an Emergency Use Authorization (EUA).  This EUA will remain in effect (meaning this test can b e used) for the duration of  the COVID-19 declaration under Section 564(b)(1) of the Act, 21 U.S.C. section 360bbb-3(b)(1), unless the authorization is terminated or revoked sooner.     Influenza A by PCR NEGATIVE NEGATIVE   Influenza B by PCR NEGATIVE NEGATIVE    Comment: (NOTE) The Xpert Xpress SARS-CoV-2/FLU/RSV plus assay is intended as an aid in the diagnosis of influenza from Nasopharyngeal swab specimens and should not be used as a sole basis for treatment. Nasal washings and aspirates are unacceptable for Xpert Xpress SARS-CoV-2/FLU/RSV testing.  Fact Sheet for Patients: BloggerCourse.com  Fact Sheet for Healthcare Providers: SeriousBroker.it  This test is not yet approved or cleared by the [place] FDA and has been authorized for  detection and/or diagnosis of SARS-CoV-2 by FDA under an Emergency Use Authorization (EUA). This  EUA will remain in effect (meaning this test can be used) for the duration of the COVID-19 declaration under Section 564(b)(1) of the Act, 21 U.S.C. section 360bbb-3(b)(1), unless the authorization is terminated or revoked.  Performed at [facility], [address], [place], [place] [address]     MICRO: reviewed IMAGING: MR THORACIC SPINE W WO CONTRAST  Result Date: 05/13/2021 CLINICAL DATA:  Mid back pain, bacteremia, IV drug abuse EXAM: MRI THORACIC AND LUMBAR SPINE WITHOUT AND WITH CONTRAST TECHNIQUE: Multiplanar and multiecho pulse sequences of the thoracic and lumbar spine were obtained without and with intravenous contrast. CONTRAST:  6.76mL GADAVIST GADOBUTROL 1 MMOL/ML IV SOLN COMPARISON:  MRI thoracic spine 04/10/2021. FINDINGS: MRI THORACIC SPINE FINDINGS Alignment:  Physiologic. Vertebrae: No fracture, evidence of discitis, or bone lesion. Cord:  Normal signal and morphology. Paraspinal and other soft tissues: Negative. Disc levels: Disc spaces:  Degenerative disease with disc height loss at T8-9. T1-T2: No disc protrusion, foraminal stenosis or central canal stenosis. T2-T3: No disc protrusion, foraminal stenosis or central canal stenosis. T3-T4: No disc protrusion, foraminal stenosis or central canal stenosis. T4-T5: No disc protrusion, foraminal stenosis or central canal stenosis. T5-T6: No disc protrusion, foraminal stenosis or central canal stenosis. T6-T7: No disc protrusion, foraminal stenosis or central canal stenosis. T7-T8: No disc protrusion, foraminal stenosis or central canal stenosis. T8-T9: Small right paracentral disc protrusion. No foraminal or central canal stenosis. T9-T10: No disc protrusion, foraminal stenosis or central canal stenosis. T10-T11: No disc protrusion, foraminal stenosis or central canal stenosis. T11-T12: Small left paracentral disc protrusion. No  foraminal or central canal stenosis. MRI LUMBAR SPINE FINDINGS Segmentation:  Standard. Alignment:  Physiologic. Vertebrae: No acute fracture. No aggressive osseous lesion. Disc height loss at T12-L1 with minimal fluid signal in the disc space and increase bone marrow edema in the adjacent vertebral body endplates compared with MRI of the thoracic spine dated 04/10/2021, concerning for early discitis-osteomyelitis. No epidural fluid collection to suggest an abscess. No paravertebral fluid collection. Conus medullaris: Extends to the L1 level and appears normal. Paraspinal and other soft tissues: No acute paraspinal abnormality. Disc levels: Disc spaces: Degenerative disease with disc height loss T12-L1. T12-L1: Small central disc protrusion. No foraminal or central canal stenosis. L1-L2: No significant disc bulge. No neural foraminal stenosis. No central canal stenosis. L2-L3: No significant disc bulge. No neural foraminal stenosis. No central canal stenosis. L3-L4: No significant disc bulge. No neural foraminal stenosis. No central canal stenosis. L4-L5: No significant disc bulge. No neural foraminal stenosis. No central canal stenosis. L5-S1: No significant disc bulge. No neural foraminal stenosis. No central canal stenosis. IMPRESSION: 1. Findings concerning for early discitis-osteomyelitis at T12-L1 with increase bone marrow edema in the adjacent vertebral body endplates compared with recent thoracic spine MRI dated 04/10/2021. No epidural or paravertebral fluid collection to suggest an abscess. 2. Mild thoracolumbar spondylosis as described above. Electronically Signed   By: Elige Ko M.D.   On: 05/13/2021 06:51   MR Lumbar Spine W Wo Contrast  Result Date: 05/13/2021 CLINICAL DATA:  Mid back pain, bacteremia, IV drug abuse EXAM: MRI THORACIC AND LUMBAR SPINE WITHOUT AND WITH CONTRAST TECHNIQUE: Multiplanar and multiecho pulse sequences of the thoracic and lumbar spine were obtained without and with  intravenous contrast. CONTRAST:  6.75mL GADAVIST GADOBUTROL 1 MMOL/ML IV SOLN COMPARISON:  MRI thoracic spine 04/10/2021. FINDINGS: MRI THORACIC SPINE FINDINGS Alignment:  Physiologic. Vertebrae: No fracture, evidence of discitis, or bone lesion. Cord:  Normal signal and morphology. Paraspinal  and other soft tissues: Negative. Disc levels: Disc spaces:  Degenerative disease with disc height loss at T8-9. T1-T2: No disc protrusion, foraminal stenosis or central canal stenosis. T2-T3: No disc protrusion, foraminal stenosis or central canal stenosis. T3-T4: No disc protrusion, foraminal stenosis or central canal stenosis. T4-T5: No disc protrusion, foraminal stenosis or central canal stenosis. T5-T6: No disc protrusion, foraminal stenosis or central canal stenosis. T6-T7: No disc protrusion, foraminal stenosis or central canal stenosis. T7-T8: No disc protrusion, foraminal stenosis or central canal stenosis. T8-T9: Small right paracentral disc protrusion. No foraminal or central canal stenosis. T9-T10: No disc protrusion, foraminal stenosis or central canal stenosis. T10-T11: No disc protrusion, foraminal stenosis or central canal stenosis. T11-T12: Small left paracentral disc protrusion. No foraminal or central canal stenosis. MRI LUMBAR SPINE FINDINGS Segmentation:  Standard. Alignment:  Physiologic. Vertebrae: No acute fracture. No aggressive osseous lesion. Disc height loss at T12-L1 with minimal fluid signal in the disc space and increase bone marrow edema in the adjacent vertebral body endplates compared with MRI of the thoracic spine dated 04/10/2021, concerning for early discitis-osteomyelitis. No epidural fluid collection to suggest an abscess. No paravertebral fluid collection. Conus medullaris: Extends to the L1 level and appears normal. Paraspinal and other soft tissues: No acute paraspinal abnormality. Disc levels: Disc spaces: Degenerative disease with disc height loss T12-L1. T12-L1: Small central disc  protrusion. No foraminal or central canal stenosis. L1-L2: No significant disc bulge. No neural foraminal stenosis. No central canal stenosis. L2-L3: No significant disc bulge. No neural foraminal stenosis. No central canal stenosis. L3-L4: No significant disc bulge. No neural foraminal stenosis. No central canal stenosis. L4-L5: No significant disc bulge. No neural foraminal stenosis. No central canal stenosis. L5-S1: No significant disc bulge. No neural foraminal stenosis. No central canal stenosis. IMPRESSION: 1. Findings concerning for early discitis-osteomyelitis at T12-L1 with increase bone marrow edema in the adjacent vertebral body endplates compared with recent thoracic spine MRI dated 04/10/2021. No epidural or paravertebral fluid collection to suggest an abscess. 2. Mild thoracolumbar spondylosis as described above. Electronically Signed   By: Elige Ko M.D.   On: 05/13/2021 06:51     Assessment/Plan:  35yo M with recent hx of MRSA bacteremia/TV endocarditis who is supposed to be in his 6th week of treatment is readmitted for backpain- early thoracolumbar discitis and asymptomatic covid-19 (not previously vaccinated)  - recommend to continue with vancomycin IV daily - will follow cultures to see if this is due to another isolate, however still suspect that this is all due to MRSA infection - MRSA osteomyelitis will need longer course of therapy, if patient is unable to take linezolid consistently, he may need long acting agents such as oritavancin/dalbavancin - will follow blood cx to see if he is bacteremic again  Covid-19 = asymptomatic presently. Will continue on airborne/contact isolation.  Opiate use= may need to watch for withdrawal.  Dr Gershon Mussel to follow up results. Available for questions over the weekend. I will see patient back on Monday.

## 2021-05-13 NOTE — ED Notes (Signed)
Patient remains in MRI 

## 2021-05-13 NOTE — ED Notes (Signed)
Attempted to call report to floor but room has not yet been assigned to a nurse. Will attempt again shortly.

## 2021-05-13 NOTE — ED Notes (Signed)
Patient was given a Cup of water and applesauce.

## 2021-05-13 NOTE — Progress Notes (Signed)
Pharmacy Antibiotic Note  Duane Price is a 35 y.o. male admitted on 05/13/2021 presenting with back pain,  MRI showing early discitis, Hx of MRSA bacteremia + tricuspid endocarditis also with emboli to lungs, ID plan for linezolid tx PTA.  Pharmacy has been consulted for Vancomycin dosing.  1250mg  IV x 1 given in ED  Plan: Vancomycin 1000 mg IV q 8h (eAUC 516, Goal AUC 400-550, SCr used 0.8) Monitor renal function, ID plan and LOT Vancomycin levels as needed  Weight: 70.9 kg (156 lb 4.9 oz)  Temp (24hrs), Avg:97.8 F (36.6 C), Min:97.4 F (36.3 C), Max:98.5 F (36.9 C)  Recent Labs  Lab 05/13/21 0308 05/13/21 0310  WBC 11.8*  --   CREATININE 0.66  --   LATICACIDVEN  --  0.8    Estimated Creatinine Clearance: 131.7 mL/min (by C-G formula based on SCr of 0.66 mg/dL).    No Known Allergies  05/15/21, PharmD Clinical Pharmacist ED Pharmacist Phone # [phone] 05/13/2021 8:12 AM

## 2021-05-13 NOTE — ED Provider Notes (Signed)
[facility] EMERGENCY DEPARTMENT Provider Note   CSN: [id number] Arrival date & time: 05/13/21  0156     History Chief Complaint  Patient presents with   Back Pain    Duane Price is a 35 y.o. male.  35 yo M with a cc of low back pain.  Going on since last night.  Patient denies injury, denies radiation.  Worse with movement, palpation, twisting.  Denies loss of bowel, bladder or perirectal sensation.  Denies numbness or weakness to the leg.  Denies fever.   The history is provided by the patient.  Back Pain Location:  Lumbar spine Quality:  Aching Radiates to:  Does not radiate Pain severity:  Moderate Onset quality:  Gradual Duration:  2 days Timing:  Constant Progression:  Worsening Chronicity:  New Relieved by:  Nothing Worsened by:  Nothing Ineffective treatments:  None tried Associated symptoms: no abdominal pain, no chest pain, no fever and no headaches       History reviewed. No pertinent past medical history.  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   Septic embolism (HCC) 03/25/2021   Normocytic anemia 03/25/2021   IV drug abuse (HCC) 03/25/2021    Past Surgical History:  Procedure Laterality Date   APPLICATION OF ANGIOVAC N/A 03/29/2021   Procedure: APPLICATION OF ANGIOVAC;  Surgeon: Corliss Skains, MD;  Location: [facility] OR;  Service: Vascular;  Laterality: N/A;   BRAIN SURGERY     facial surgery         Family History  Family history unknown: Yes    Social History   Tobacco Use   Smoking status: Every Day    Packs/day: 0.50    Types: Cigarettes   Smokeless tobacco: Never  Substance Use Topics   Alcohol use: Yes   Drug use: Yes    Types: Cocaine, IV    Comment: Heroin    Home Medications Prior to Admission medications   Medication Sig Start Date End Date Taking? Authorizing Provider   apixaban (ELIQUIS) 5 MG TABS tablet Take 1 tablet (5 mg total) by mouth 2 (two) times daily. 04/25/21  Yes Kathlen Mody, MD  hydrOXYzine (ATARAX/VISTARIL) 25 MG tablet Take 1 tablet (25 mg total) by mouth 3 (three) times daily as needed for anxiety. 04/22/21  Yes Kathlen Mody, MD  linezolid (ZYVOX) 600 MG tablet Take 600 mg by mouth See admin instructions. Bid x 16 days   Yes [provider]  melatonin 3 MG TABS tablet Take 1 tablet (3 mg total) by mouth at bedtime. 04/22/21 05/22/21 Yes Kathlen Mody, MD  pantoprazole (PROTONIX) 40 MG tablet Take 1 tablet (40 mg total) by mouth daily. 04/23/21 05/23/21 Yes Kathlen Mody, MD  feeding supplement (ENSURE ENLIVE / ENSURE PLUS) LIQD Take 237 mLs by mouth 2 (two) times daily between meals. Patient not taking: Reported on 05/13/2021 04/22/21   Kathlen Mody, MD    Allergies    Patient has no known allergies.  Review of Systems   Review of Systems  Constitutional:  Negative for chills and fever.  HENT:  Negative for congestion and facial swelling.   Eyes:  Negative for discharge and visual disturbance.  Respiratory:  Negative for shortness of breath.   Cardiovascular:  Negative for chest pain and palpitations.  Gastrointestinal:  Negative for abdominal pain, diarrhea and vomiting.  Musculoskeletal:  Positive for back pain. Negative  for arthralgias and myalgias.  Skin:  Negative for color change and rash.  Neurological:  Negative for tremors, syncope and headaches.  Psychiatric/Behavioral:  Negative for confusion and dysphoric mood.    Physical Exam Updated Vital Signs BP 124/74   Pulse 88   Temp (!) 97.4 F (36.3 C) (Oral)   Resp 16   Wt 70.9 kg   SpO2 100%   BMI 21.20 kg/m   Physical Exam Vitals and nursing note reviewed.  Constitutional:      Appearance: He is well-developed.  HENT:     Head: Normocephalic and atraumatic.  Eyes:     Pupils: Pupils are equal, round, and reactive to light.  Neck:     Vascular: No JVD.   Cardiovascular:     Rate and Rhythm: Normal rate and regular rhythm.     Heart sounds: No murmur heard.   No friction rub. No gallop.  Pulmonary:     Effort: No respiratory distress.     Breath sounds: No wheezing.  Abdominal:     General: There is no distension.     Tenderness: There is no abdominal tenderness. There is no guarding or rebound.  Musculoskeletal:        General: Normal range of motion.     Cervical back: Normal range of motion and neck supple.     Comments: PMS intact distally.  Negative slr test.  Reflexes 2+ and equal.  No clonus, negative babinski.   Skin:    Coloration: Skin is not pale.     Findings: No rash.  Neurological:     Mental Status: He is alert and oriented to person, place, and time.  Psychiatric:        Behavior: Behavior normal.    ED Results / Procedures / Treatments   Labs (all labs ordered are listed, but only abnormal results are displayed) Labs Reviewed  CBC WITH DIFFERENTIAL/PLATELET - Abnormal; Notable for the following components:      Result Value   WBC 11.8 (*)    RBC 3.10 (*)    Hemoglobin 8.9 (*)    HCT 28.1 (*)    Neutro Abs 10.2 (*)    All other components within normal limits  BASIC METABOLIC PANEL - Abnormal; Notable for the following components:   Sodium 134 (*)    Glucose, Bld 152 (*)    BUN <5 (*)    Calcium 8.8 (*)    All other components within normal limits  SEDIMENTATION RATE - Abnormal; Notable for the following components:   Sed Rate 95 (*)    All other components within normal limits  C-REACTIVE PROTEIN - Abnormal; Notable for the following components:   CRP 12.2 (*)    All other components within normal limits  CULTURE, BLOOD (ROUTINE X 2)  CULTURE, BLOOD (ROUTINE X 2)  LACTIC ACID, PLASMA    EKG None  Radiology MR THORACIC SPINE W WO CONTRAST  Result Date: 05/13/2021 CLINICAL DATA:  Mid back pain, bacteremia, IV drug abuse EXAM: MRI THORACIC AND LUMBAR SPINE WITHOUT AND WITH CONTRAST TECHNIQUE:  Multiplanar and multiecho pulse sequences of the thoracic and lumbar spine were obtained without and with intravenous contrast. CONTRAST:  6.58mL GADAVIST GADOBUTROL 1 MMOL/ML IV SOLN COMPARISON:  MRI thoracic spine 04/10/2021. FINDINGS: MRI THORACIC SPINE FINDINGS Alignment:  Physiologic. Vertebrae: No fracture, evidence of discitis, or bone lesion. Cord:  Normal signal and morphology. Paraspinal and other soft tissues: Negative. Disc levels: Disc spaces:  Degenerative disease with disc  height loss at T8-9. T1-T2: No disc protrusion, foraminal stenosis or central canal stenosis. T2-T3: No disc protrusion, foraminal stenosis or central canal stenosis. T3-T4: No disc protrusion, foraminal stenosis or central canal stenosis. T4-T5: No disc protrusion, foraminal stenosis or central canal stenosis. T5-T6: No disc protrusion, foraminal stenosis or central canal stenosis. T6-T7: No disc protrusion, foraminal stenosis or central canal stenosis. T7-T8: No disc protrusion, foraminal stenosis or central canal stenosis. T8-T9: Small right paracentral disc protrusion. No foraminal or central canal stenosis. T9-T10: No disc protrusion, foraminal stenosis or central canal stenosis. T10-T11: No disc protrusion, foraminal stenosis or central canal stenosis. T11-T12: Small left paracentral disc protrusion. No foraminal or central canal stenosis. MRI LUMBAR SPINE FINDINGS Segmentation:  Standard. Alignment:  Physiologic. Vertebrae: No acute fracture. No aggressive osseous lesion. Disc height loss at T12-L1 with minimal fluid signal in the disc space and increase bone marrow edema in the adjacent vertebral body endplates compared with MRI of the thoracic spine dated 04/10/2021, concerning for early discitis-osteomyelitis. No epidural fluid collection to suggest an abscess. No paravertebral fluid collection. Conus medullaris: Extends to the L1 level and appears normal. Paraspinal and other soft tissues: No acute paraspinal abnormality.  Disc levels: Disc spaces: Degenerative disease with disc height loss T12-L1. T12-L1: Small central disc protrusion. No foraminal or central canal stenosis. L1-L2: No significant disc bulge. No neural foraminal stenosis. No central canal stenosis. L2-L3: No significant disc bulge. No neural foraminal stenosis. No central canal stenosis. L3-L4: No significant disc bulge. No neural foraminal stenosis. No central canal stenosis. L4-L5: No significant disc bulge. No neural foraminal stenosis. No central canal stenosis. L5-S1: No significant disc bulge. No neural foraminal stenosis. No central canal stenosis. IMPRESSION: 1. Findings concerning for early discitis-osteomyelitis at T12-L1 with increase bone marrow edema in the adjacent vertebral body endplates compared with recent thoracic spine MRI dated 04/10/2021. No epidural or paravertebral fluid collection to suggest an abscess. 2. Mild thoracolumbar spondylosis as described above. Electronically Signed   By: Elige KoHetal  Patel M.D.   On: 05/13/2021 06:51   MR Lumbar Spine W Wo Contrast  Result Date: 05/13/2021 CLINICAL DATA:  Mid back pain, bacteremia, IV drug abuse EXAM: MRI THORACIC AND LUMBAR SPINE WITHOUT AND WITH CONTRAST TECHNIQUE: Multiplanar and multiecho pulse sequences of the thoracic and lumbar spine were obtained without and with intravenous contrast. CONTRAST:  6.405mL GADAVIST GADOBUTROL 1 MMOL/ML IV SOLN COMPARISON:  MRI thoracic spine 04/10/2021. FINDINGS: MRI THORACIC SPINE FINDINGS Alignment:  Physiologic. Vertebrae: No fracture, evidence of discitis, or bone lesion. Cord:  Normal signal and morphology. Paraspinal and other soft tissues: Negative. Disc levels: Disc spaces:  Degenerative disease with disc height loss at T8-9. T1-T2: No disc protrusion, foraminal stenosis or central canal stenosis. T2-T3: No disc protrusion, foraminal stenosis or central canal stenosis. T3-T4: No disc protrusion, foraminal stenosis or central canal stenosis. T4-T5: No disc  protrusion, foraminal stenosis or central canal stenosis. T5-T6: No disc protrusion, foraminal stenosis or central canal stenosis. T6-T7: No disc protrusion, foraminal stenosis or central canal stenosis. T7-T8: No disc protrusion, foraminal stenosis or central canal stenosis. T8-T9: Small right paracentral disc protrusion. No foraminal or central canal stenosis. T9-T10: No disc protrusion, foraminal stenosis or central canal stenosis. T10-T11: No disc protrusion, foraminal stenosis or central canal stenosis. T11-T12: Small left paracentral disc protrusion. No foraminal or central canal stenosis. MRI LUMBAR SPINE FINDINGS Segmentation:  Standard. Alignment:  Physiologic. Vertebrae: No acute fracture. No aggressive osseous lesion. Disc height loss at T12-L1 with minimal fluid  signal in the disc space and increase bone marrow edema in the adjacent vertebral body endplates compared with MRI of the thoracic spine dated 04/10/2021, concerning for early discitis-osteomyelitis. No epidural fluid collection to suggest an abscess. No paravertebral fluid collection. Conus medullaris: Extends to the L1 level and appears normal. Paraspinal and other soft tissues: No acute paraspinal abnormality. Disc levels: Disc spaces: Degenerative disease with disc height loss T12-L1. T12-L1: Small central disc protrusion. No foraminal or central canal stenosis. L1-L2: No significant disc bulge. No neural foraminal stenosis. No central canal stenosis. L2-L3: No significant disc bulge. No neural foraminal stenosis. No central canal stenosis. L3-L4: No significant disc bulge. No neural foraminal stenosis. No central canal stenosis. L4-L5: No significant disc bulge. No neural foraminal stenosis. No central canal stenosis. L5-S1: No significant disc bulge. No neural foraminal stenosis. No central canal stenosis. IMPRESSION: 1. Findings concerning for early discitis-osteomyelitis at T12-L1 with increase bone marrow edema in the adjacent vertebral  body endplates compared with recent thoracic spine MRI dated 04/10/2021. No epidural or paravertebral fluid collection to suggest an abscess. 2. Mild thoracolumbar spondylosis as described above. Electronically Signed   By: Elige Ko M.D.   On: 05/13/2021 06:51    Procedures Procedures   Medications Ordered in ED Medications  vancomycin (VANCOREADY) IVPB 1250 mg/250 mL (has no administration in time range)  ketorolac (TORADOL) 15 MG/ML injection 15 mg (has no administration in time range)  acetaminophen (TYLENOL) tablet 1,000 mg (1,000 mg Oral Given 05/13/21 0457)  ketamine 50 mg in normal saline 5 mL (10 mg/mL) syringe (15 mg Intravenous Given 05/13/21 0500)  gadobutrol (GADAVIST) 1 MMOL/ML injection 6.5 mL (6.5 mLs Intravenous Contrast Given 05/13/21 0175)    ED Course  I have reviewed the triage vital signs and the nursing notes.  Pertinent labs & imaging results that were available during my care of the patient were reviewed by me and considered in my medical decision making (see chart for details).    MDM Rules/Calculators/A&P                           35 yo M with a cc of low back pain.  Going on since last night.  With hx of IVDU will obtain MRI.  Labs.  Treat pain here.   MRI concerning for early discitis or osteomyelitis.  Will start back on IV vancomycin.  Will discuss with medicine for admission.  The patients results and plan were reviewed and discussed.   Any x-rays performed were independently reviewed by myself.   Differential diagnosis were considered with the presenting HPI.  Medications  vancomycin (VANCOREADY) IVPB 1250 mg/250 mL (has no administration in time range)  ketorolac (TORADOL) 15 MG/ML injection 15 mg (has no administration in time range)  acetaminophen (TYLENOL) tablet 1,000 mg (1,000 mg Oral Given 05/13/21 0457)  ketamine 50 mg in normal saline 5 mL (10 mg/mL) syringe (15 mg Intravenous Given 05/13/21 0500)  gadobutrol (GADAVIST) 1 MMOL/ML injection  6.5 mL (6.5 mLs Intravenous Contrast Given 05/13/21 0619)    Vitals:   05/13/21 0500 05/13/21 0622 05/13/21 0651 05/13/21 0700  BP: 116/73 121/70 124/74   Pulse: 62 64 88   Resp: 12 16 16    Temp:   (!) 97.4 F (36.3 C)   TempSrc:   Oral   SpO2: 99% 99% 100%   Weight:    70.9 kg    Final diagnoses:  Discitis of thoracolumbar region  Admission/ observation were discussed with the admitting physician, patient and/or family and they are comfortable with the plan.   Final Clinical Impression(s) / ED Diagnoses Final diagnoses:  Discitis of thoracolumbar region    Rx / DC Orders ED Discharge Orders     None        Melene Plan, DO 05/13/21 1610

## 2021-05-13 NOTE — Progress Notes (Signed)
PHARMACY - PHYSICIAN COMMUNICATION CRITICAL VALUE ALERT - BLOOD CULTURE IDENTIFICATION (BCID)  Duane Price is an 35 y.o. male who presented to [facility] on 05/13/2021 with a chief complaint of back pain  Assessment:  Blood culture with MRSA.  Recent MRSA bacteremia/endocarditis last admission.  Name of physician (or Provider) Contacted: n/a  Current antibiotics: vancomycin  Changes to prescribed antibiotics recommended:  None, continue vancomycin.  Results for orders placed or performed during the hospital encounter of 05/13/21  Blood Culture ID Panel (Reflexed) (Collected: 05/13/2021  3:08 AM)  Result Value Ref Range   Enterococcus faecalis NOT DETECTED NOT DETECTED   Enterococcus Faecium NOT DETECTED NOT DETECTED   Listeria monocytogenes NOT DETECTED NOT DETECTED   Staphylococcus species DETECTED (A) NOT DETECTED   Staphylococcus aureus (BCID) DETECTED (A) NOT DETECTED   Staphylococcus epidermidis NOT DETECTED NOT DETECTED   Staphylococcus lugdunensis NOT DETECTED NOT DETECTED   Streptococcus species NOT DETECTED NOT DETECTED   Streptococcus agalactiae NOT DETECTED NOT DETECTED   Streptococcus pneumoniae NOT DETECTED NOT DETECTED   Streptococcus pyogenes NOT DETECTED NOT DETECTED   A.calcoaceticus-baumannii NOT DETECTED NOT DETECTED   Bacteroides fragilis NOT DETECTED NOT DETECTED   Enterobacterales NOT DETECTED NOT DETECTED   Enterobacter cloacae complex NOT DETECTED NOT DETECTED   Escherichia coli NOT DETECTED NOT DETECTED   Klebsiella aerogenes NOT DETECTED NOT DETECTED   Klebsiella oxytoca NOT DETECTED NOT DETECTED   Klebsiella pneumoniae NOT DETECTED NOT DETECTED   Proteus species NOT DETECTED NOT DETECTED   Salmonella species NOT DETECTED NOT DETECTED   Serratia marcescens NOT DETECTED NOT DETECTED   Haemophilus influenzae NOT DETECTED NOT DETECTED   Neisseria meningitidis NOT DETECTED NOT DETECTED   Pseudomonas aeruginosa NOT DETECTED NOT DETECTED    Stenotrophomonas maltophilia NOT DETECTED NOT DETECTED   Candida albicans NOT DETECTED NOT DETECTED   Candida auris NOT DETECTED NOT DETECTED   Candida glabrata NOT DETECTED NOT DETECTED   Candida krusei NOT DETECTED NOT DETECTED   Candida parapsilosis NOT DETECTED NOT DETECTED   Candida tropicalis NOT DETECTED NOT DETECTED   Cryptococcus neoformans/gattii NOT DETECTED NOT DETECTED   Meth resistant mecA/C and MREJ DETECTED (A) NOT DETECTED    Tad Moore, BCPS, BCCP Clinical Pharmacist  05/13/2021 9:27 PM   [facility] pharmacy phone numbers are listed on [email or web address]

## 2021-05-14 DIAGNOSIS — B9562 Methicillin resistant Staphylococcus aureus infection as the cause of diseases classified elsewhere: Secondary | ICD-10-CM

## 2021-05-14 DIAGNOSIS — R7881 Bacteremia: Secondary | ICD-10-CM

## 2021-05-14 LAB — COMPREHENSIVE METABOLIC PANEL
ALT: 11 U/L (ref 0–44)
AST: 12 U/L — ABNORMAL LOW (ref 15–41)
Albumin: 2.3 g/dL — ABNORMAL LOW (ref 3.5–5.0)
Alkaline Phosphatase: 66 U/L (ref 38–126)
Anion gap: 8 (ref 5–15)
BUN: 13 mg/dL (ref 6–20)
CO2: 26 mmol/L (ref 22–32)
Calcium: 8.5 mg/dL — ABNORMAL LOW (ref 8.9–10.3)
Chloride: 97 mmol/L — ABNORMAL LOW (ref 98–111)
Creatinine, Ser: 0.83 mg/dL (ref 0.61–1.24)
GFR, Estimated: >60 mL/min (ref >60)
Glucose, Bld: 142 mg/dL — ABNORMAL HIGH (ref 70–99)
Potassium: 3.9 mmol/L (ref 3.5–5.1)
Sodium: 131 mmol/L — ABNORMAL LOW (ref 135–145)
Total Bilirubin: 0.3 mg/dL (ref 0.3–1.2)
Total Protein: 6.5 g/dL (ref 6.5–8.1)

## 2021-05-14 LAB — CBC
HCT: 28.3 % — ABNORMAL LOW (ref 39.0–52.0)
Hemoglobin: 9.2 g/dL — ABNORMAL LOW (ref 13.0–17.0)
MCH: 28.1 pg (ref 26.0–34.0)
MCHC: 32.5 g/dL (ref 30.0–36.0)
MCV: 86.5 fL (ref 80.0–100.0)
Platelets: 299 10*3/uL (ref 150–400)
RBC: 3.27 MIL/uL — ABNORMAL LOW (ref 4.22–5.81)
RDW: 14.9 % (ref 11.5–15.5)
WBC: 11 10*3/uL — ABNORMAL HIGH (ref 4.0–10.5)
nRBC: 0 % (ref 0.0–0.2)

## 2021-05-14 MED ORDER — SENNA 8.6 MG PO TABS
2.0000 | ORAL_TABLET | Freq: Every day | ORAL | Status: DC
  Administered 2021-05-14 – 2021-05-29 (×16): 17.2 mg via ORAL
  Filled 2021-05-14 (×16): qty 2

## 2021-05-14 MED ORDER — OXYCODONE HCL 5 MG PO TABS
20.0000 mg | ORAL_TABLET | ORAL | Status: DC | PRN
  Administered 2021-05-14 – 2021-05-29 (×68): 20 mg via ORAL
  Filled 2021-05-14 (×69): qty 4

## 2021-05-14 MED ORDER — HYDROMORPHONE HCL 1 MG/ML IJ SOLN
1.0000 mg | INTRAMUSCULAR | Status: DC | PRN
  Administered 2021-05-14 – 2021-05-15 (×4): 1 mg via INTRAVENOUS
  Filled 2021-05-14 (×4): qty 1

## 2021-05-14 MED ORDER — POLYETHYLENE GLYCOL 3350 17 G PO PACK
17.0000 g | PACK | Freq: Every day | ORAL | Status: DC
  Administered 2021-05-14 – 2021-05-17 (×4): 17 g via ORAL
  Filled 2021-05-14 (×4): qty 1

## 2021-05-14 MED ORDER — OXYCODONE HCL 5 MG PO TABS
10.0000 mg | ORAL_TABLET | ORAL | Status: DC | PRN
  Administered 2021-05-14 (×2): 10 mg via ORAL
  Filled 2021-05-14 (×2): qty 2

## 2021-05-14 MED ORDER — NALOXONE HCL 0.4 MG/ML IJ SOLN: 0.4000 mg | INTRAMUSCULAR | Status: DC | PRN

## 2021-05-14 MED ORDER — METHADONE HCL 10 MG PO TABS
20.0000 mg | ORAL_TABLET | Freq: Two times a day (BID) | ORAL | Status: DC
Start: 2021-05-14 — End: 2021-05-15
  Administered 2021-05-14 (×2): 20 mg via ORAL
  Filled 2021-05-14 (×2): qty 2

## 2021-05-14 NOTE — Progress Notes (Signed)
Id brief note   Admission bcx mrsa by bcid   A/p Osteomyelitis Mrsa bacteremia Noncompliant   -given bacteremia would need to treat at least 2 weeks iv abx prior to transitioning to oral (if a candidate) -would keep here given noncompliance -continue iv vancomycin -will repeat bcx tomorrow -- if that continues to be positive will need more source workup -dr Drue Second to be back on 8/22 -I am available for emergency call         Raymondo Band, MD [facility] [phone]  pager    05/14/2021, 11:38 AM

## 2021-05-14 NOTE — Progress Notes (Signed)
PROGRESS NOTE        PATIENT DETAILS Name: Duane Price Age: 35 y.o. Sex: male Date of Birth: 09-24-1986 Admit Date: 05/13/2021 Admitting Physician Clydie Braun, MD UJW:[id number], No Pcp Per (Inactive)  Brief Narrative: Patient is a 35 y.o. 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  Acknowledges noncompliance to medications and ongoing IV heroin use.  Significant events: 7/1-8/1>> hospitalization for MRSA bacteremia with septic pulmonary emboli and tricuspid valve endocarditis-s/p angio vac debridement.  Discharged on oral linezolid 8/19>> admit for worsening back pain-found to have T12-L1 discitis.    Significant studies: 8/19>> MRI thoracolumbar spine: Early discitis/osteomyelitis T12-L1  Antimicrobial therapy: Vancomycin: 8/19>>  Microbiology data: 8/19>> blood culture 1/2: Gram-positive cocci in clusters.  Procedures : None  Consults: Infectious disease  DVT Prophylaxis : apixaban (ELIQUIS) tablet 5 mg   Subjective: Complains of severe back pain-not relieved by on methadone.   Assessment/Plan: MRSA bacteremia-recent tricuspid valve endocarditis (s/p angio vac debridement on 7/5)-now with T12-L1 discitis in the setting of noncompliance of antimicrobial therapy and ongoing IV drug use: Continue IV vancomycin-repeat cultures-await further input from ID.  Continues to complain of severe back pain-increase methadone to 20 mg twice daily-add oxycodone for breakthrough pain.  COVID-19 infection: Clearly asymptomatic-maintain on isolation for 10 days.  Do not think he has an active infection-do not think he requires any COVID-19 specific treatment at this point  History of PE: On Eliquis  Normocytic anemia: No evidence of blood loss-likely due to  acute illness/smoldering infection.  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  Will need social work counseling when closer to discharge.  GERD: PPI  Homelessness  Diet: Diet Order             Diet regular Room service appropriate? Yes; Fluid consistency: Thin  Diet effective now                    Code Status: Full code   Family Communication: None at bedside  Disposition Plan: Status is: Inpatient  Remains inpatient appropriate because:Inpatient level of care appropriate due to severity of illness  Dispo: The patient is from: Home              Anticipated d/c is to: Home              Patient currently is not medically stable to d/c.   Difficult to place patient No   Barriers to Discharge: MRSA bacteremia-not a candidate for outpatient IV antimicrobial therapy.  Needs to remain inpatient until he clears the bacteremia.  Antimicrobial agents: Anti-infectives (From admission, onward)    Start     Dose/Rate Route Frequency Ordered Stop   05/13/21 1600  vancomycin (VANCOCIN) IVPB 1000 mg/200 mL premix        1,000 mg 200 mL/hr over 60 Minutes Intravenous Every 8 hours 05/13/21 0818     05/13/21 0715  vancomycin (VANCOREADY) IVPB 1250 mg/250 mL        1,250 mg 166.7 mL/hr over 90 Minutes Intravenous  Once 05/13/21 0655 05/13/21 0925        Time spent: 35 minutes-Greater than 50% of this time was spent in counseling, explanation of diagnosis, planning of further management,  and coordination of care.  MEDICATIONS: Scheduled Meds:  apixaban  5 mg Oral BID   feeding supplement  237 mL Oral BID BM   lidocaine  1 patch Transdermal Q24H   melatonin  3 mg Oral QHS   methadone  20 mg Oral Q12H   nicotine  14 mg Transdermal Daily   pantoprazole  40 mg Oral Daily   polyethylene glycol  17 g Oral Daily   senna  2 tablet Oral QHS   sodium chloride flush  3 mL Intravenous Q12H   Continuous Infusions:  vancomycin 1,000 mg (05/14/21 0848)   PRN  Meds:.acetaminophen **OR** acetaminophen, albuterol, hydrOXYzine, ondansetron **OR** ondansetron (ZOFRAN) IV, oxyCODONE   PHYSICAL EXAM: Vital signs: Vitals:   05/13/21 2000 05/13/21 2300 05/14/21 0400 05/14/21 0750  BP: 121/75 103/65 119/75 (!) 109/58  Pulse: 83 90 73 74  Resp: 14 14 19 15   Temp: 98 F (36.7 C)  98.5 F (36.9 C) 98.3 F (36.8 C)  TempSrc: Oral  Oral Oral  SpO2: 100%  99% 99%  Weight:   70.4 kg    Filed Weights   05/13/21 0700 05/14/21 0400  Weight: 70.9 kg 70.4 kg   Body mass index is 21.05 kg/m.   Gen Exam:Alert awake-not in any distress HEENT:atraumatic, normocephalic Chest: B/L clear to auscultation anteriorly CVS:S1S2 regular Abdomen:soft non tender, non distended Extremities:no edema Neurology: Non focal Skin: no rash  I have personally reviewed following labs and imaging studies  LABORATORY DATA: CBC: Recent Labs  Lab 05/13/21 0308 05/14/21 0040  WBC 11.8* 11.0*  NEUTROABS 10.2*  --   HGB 8.9* 9.2*  HCT 28.1* 28.3*  MCV 90.6 86.5  PLT 268 299    Basic Metabolic Panel: Recent Labs  Lab 05/13/21 0308 05/14/21 0040  NA 134* 131*  K 3.9 3.9  CL 98 97*  CO2 29 26  GLUCOSE 152* 142*  BUN <5* 13  CREATININE 0.66 0.83  CALCIUM 8.8* 8.5*    GFR: Estimated Creatinine Clearance: 126.1 mL/min (by C-G formula based on SCr of 0.83 mg/dL).  Liver Function Tests: Recent Labs  Lab 05/14/21 0040  AST 12*  ALT 11  ALKPHOS 66  BILITOT 0.3  PROT 6.5  ALBUMIN 2.3*   No results for input(s): LIPASE, AMYLASE in the last 168 hours. No results for input(s): AMMONIA in the last 168 hours.  Coagulation Profile: No results for input(s): INR, PROTIME in the last 168 hours.  Cardiac Enzymes: No results for input(s): CKTOTAL, CKMB, CKMBINDEX, TROPONINI in the last 168 hours.  BNP (last 3 results) No results for input(s): PROBNP in the last 8760 hours.  Lipid Profile: No results for input(s): CHOL, HDL, LDLCALC, TRIG, CHOLHDL,  LDLDIRECT in the last 72 hours.  Thyroid Function Tests: No results for input(s): TSH, T4TOTAL, FREET4, T3FREE, THYROIDAB in the last 72 hours.  Anemia Panel: No results for input(s): VITAMINB12, FOLATE, FERRITIN, TIBC, IRON, RETICCTPCT in the last 72 hours.  Urine analysis:    Component Value Date/Time   COLORURINE YELLOW 04/19/2021 1546   APPEARANCEUR HAZY (A) 04/19/2021 1546   LABSPEC 1.021 04/19/2021 1546   PHURINE 5.0 04/19/2021 1546   GLUCOSEU NEGATIVE 04/19/2021 1546   HGBUR LARGE (A) 04/19/2021 1546   BILIRUBINUR NEGATIVE 04/19/2021 1546   KETONESUR NEGATIVE 04/19/2021 1546   PROTEINUR 30 (A) 04/19/2021 1546   UROBILINOGEN 1.0 02/09/2010 2121   NITRITE NEGATIVE 04/19/2021 1546   LEUKOCYTESUR NEGATIVE 04/19/2021 1546    Sepsis Labs: Lactic Acid, Venous  Component Value Date/Time   LATICACIDVEN 0.8 05/13/2021 0310    MICROBIOLOGY: Recent Results (from the past 240 hour(s))  Blood culture (routine x 2)     Status: None (Preliminary result)   Collection Time: 05/13/21  3:08 AM   Specimen: BLOOD RIGHT HAND  Result Value Ref Range Status   Specimen Description BLOOD RIGHT HAND  Final   Special Requests   Final    BOTTLES DRAWN AEROBIC AND ANAEROBIC Blood Culture adequate volume   Culture  Setup Time   Final    GRAM POSITIVE COCCI IN CLUSTERS IN BOTH AEROBIC AND ANAEROBIC BOTTLES CRITICAL RESULT CALLED TO, READ BACK BY AND VERIFIED WITHArnaldo Natal [facility] 2116 05/13/21 A BROWNING Performed at [facility], [address], [place], [place] [address]    Culture GRAM POSITIVE COCCI  Final   Report Status PENDING  Incomplete  Blood Culture ID Panel (Reflexed)     Status: Abnormal   Collection Time: 05/13/21  3:08 AM  Result Value Ref Range Status   Enterococcus faecalis NOT DETECTED NOT DETECTED Final   Enterococcus Faecium NOT DETECTED NOT DETECTED Final   Listeria monocytogenes NOT DETECTED NOT DETECTED Final   Staphylococcus species DETECTED (A) NOT  DETECTED Final    Comment: CRITICAL RESULT CALLED TO, READ BACK BY AND VERIFIED WITH: Arnaldo Natal PHARMD 2116 05/13/21 A BROWNING    Staphylococcus aureus (BCID) DETECTED (A) NOT DETECTED Final    Comment: Methicillin (oxacillin)-resistant Staphylococcus aureus (MRSA). MRSA is predictably resistant to beta-lactam antibiotics (except ceftaroline). Preferred therapy is vancomycin unless clinically contraindicated. Patient requires contact precautions if  hospitalized. CRITICAL RESULT CALLED TO, READ BACK BY AND VERIFIED WITH: Arnaldo Natal PHARMD 2116 05/13/21 A BROWNING    Staphylococcus epidermidis NOT DETECTED NOT DETECTED Final   Staphylococcus lugdunensis NOT DETECTED NOT DETECTED Final   Streptococcus species NOT DETECTED NOT DETECTED Final   Streptococcus agalactiae NOT DETECTED NOT DETECTED Final   Streptococcus pneumoniae NOT DETECTED NOT DETECTED Final   Streptococcus pyogenes NOT DETECTED NOT DETECTED Final   A.calcoaceticus-baumannii NOT DETECTED NOT DETECTED Final   Bacteroides fragilis NOT DETECTED NOT DETECTED Final   Enterobacterales NOT DETECTED NOT DETECTED Final   Enterobacter cloacae complex NOT DETECTED NOT DETECTED Final   Escherichia coli NOT DETECTED NOT DETECTED Final   Klebsiella aerogenes NOT DETECTED NOT DETECTED Final   Klebsiella oxytoca NOT DETECTED NOT DETECTED Final   Klebsiella pneumoniae NOT DETECTED NOT DETECTED Final   Proteus species NOT DETECTED NOT DETECTED Final   Salmonella species NOT DETECTED NOT DETECTED Final   Serratia marcescens NOT DETECTED NOT DETECTED Final   Haemophilus influenzae NOT DETECTED NOT DETECTED Final   Neisseria meningitidis NOT DETECTED NOT DETECTED Final   Pseudomonas aeruginosa NOT DETECTED NOT DETECTED Final   Stenotrophomonas maltophilia NOT DETECTED NOT DETECTED Final   Candida albicans NOT DETECTED NOT DETECTED Final   Candida auris NOT DETECTED NOT DETECTED Final   Candida glabrata NOT DETECTED NOT DETECTED Final   Candida  krusei NOT DETECTED NOT DETECTED Final   Candida parapsilosis NOT DETECTED NOT DETECTED Final   Candida tropicalis NOT DETECTED NOT DETECTED Final   Cryptococcus neoformans/gattii NOT DETECTED NOT DETECTED Final   Meth resistant mecA/C and MREJ DETECTED (A) NOT DETECTED Final    Comment: CRITICAL RESULT CALLED TO, READ BACK BY AND VERIFIED WITHArnaldo Natal [facility] 2116 05/13/21 A BROWNING Performed at [facility], [address], [place], [place] [address]   Blood culture (routine x  2)     Status: None (Preliminary result)   Collection Time: 05/13/21  3:09 AM   Specimen: BLOOD LEFT HAND  Result Value Ref Range Status   Specimen Description BLOOD LEFT HAND  Final   Special Requests   Final    BOTTLES DRAWN AEROBIC AND ANAEROBIC Blood Culture adequate volume   Culture  Setup Time   Final    GRAM POSITIVE COCCI IN CLUSTERS AEROBIC BOTTLE ONLY CRITICAL VALUE NOTED.  VALUE IS CONSISTENT WITH PREVIOUSLY REPORTED AND CALLED VALUE.    Culture   Final    NO GROWTH 1 DAY Performed at [facility], [address] [place], [place] [address]    Report Status PENDING  Incomplete  Resp Panel by RT-PCR (Flu A&B, Covid) Nasopharyngeal Swab     Status: Abnormal   Collection Time: 05/13/21  8:07 AM   Specimen: Nasopharyngeal Swab; Nasopharyngeal(NP) swabs in vial transport medium  Result Value Ref Range Status   SARS Coronavirus 2 by RT PCR POSITIVE (A) NEGATIVE Final    Comment: RESULT CALLED TO, READ BACK BY AND VERIFIED WITH: RN Cherlyn Labella [id number] AT 1016 BY CM (NOTE) SARS-CoV-2 target nucleic acids are DETECTED.  The SARS-CoV-2 RNA is generally detectable in upper respiratory specimens during the acute phase of infection. Positive results are indicative of the presence of the identified virus, but do not rule out bacterial infection or co-infection with other pathogens not detected by the test. Clinical correlation with patient history and other diagnostic information is necessary  to determine patient infection status. The expected result is Negative.  Fact Sheet for Patients: BloggerCourse.com  Fact Sheet for Healthcare Providers: SeriousBroker.it  This test is not yet approved or cleared by the [place] FDA and  has been authorized for detection and/or diagnosis of SARS-CoV-2 by FDA under an Emergency Use Authorization (EUA).  This EUA will remain in effect (meaning this test can b e used) for the duration of  the COVID-19 declaration under Section 564(b)(1) of the Act, 21 U.S.C. section 360bbb-3(b)(1), unless the authorization is terminated or revoked sooner.     Influenza A by PCR NEGATIVE NEGATIVE Final   Influenza B by PCR NEGATIVE NEGATIVE Final    Comment: (NOTE) The Xpert Xpress SARS-CoV-2/FLU/RSV plus assay is intended as an aid in the diagnosis of influenza from Nasopharyngeal swab specimens and should not be used as a sole basis for treatment. Nasal washings and aspirates are unacceptable for Xpert Xpress SARS-CoV-2/FLU/RSV testing.  Fact Sheet for Patients: BloggerCourse.com  Fact Sheet for Healthcare Providers: SeriousBroker.it  This test is not yet approved or cleared by the [place] FDA and has been authorized for detection and/or diagnosis of SARS-CoV-2 by FDA under an Emergency Use Authorization (EUA). This EUA will remain in effect (meaning this test can be used) for the duration of the COVID-19 declaration under Section 564(b)(1) of the Act, 21 U.S.C. section 360bbb-3(b)(1), unless the authorization is terminated or revoked.  Performed at [facility], [address], [place], [place] [address]     RADIOLOGY STUDIES/RESULTS: MR THORACIC SPINE W WO CONTRAST  Result Date: 05/13/2021 CLINICAL DATA:  Mid back pain, bacteremia, IV drug abuse EXAM: MRI THORACIC AND LUMBAR SPINE WITHOUT AND WITH CONTRAST TECHNIQUE:  Multiplanar and multiecho pulse sequences of the thoracic and lumbar spine were obtained without and with intravenous contrast. CONTRAST:  6.43mL GADAVIST GADOBUTROL 1 MMOL/ML IV SOLN COMPARISON:  MRI thoracic spine 04/10/2021. FINDINGS: MRI THORACIC SPINE FINDINGS Alignment:  Physiologic.  Vertebrae: No fracture, evidence of discitis, or bone lesion. Cord:  Normal signal and morphology. Paraspinal and other soft tissues: Negative. Disc levels: Disc spaces:  Degenerative disease with disc height loss at T8-9. T1-T2: No disc protrusion, foraminal stenosis or central canal stenosis. T2-T3: No disc protrusion, foraminal stenosis or central canal stenosis. T3-T4: No disc protrusion, foraminal stenosis or central canal stenosis. T4-T5: No disc protrusion, foraminal stenosis or central canal stenosis. T5-T6: No disc protrusion, foraminal stenosis or central canal stenosis. T6-T7: No disc protrusion, foraminal stenosis or central canal stenosis. T7-T8: No disc protrusion, foraminal stenosis or central canal stenosis. T8-T9: Small right paracentral disc protrusion. No foraminal or central canal stenosis. T9-T10: No disc protrusion, foraminal stenosis or central canal stenosis. T10-T11: No disc protrusion, foraminal stenosis or central canal stenosis. T11-T12: Small left paracentral disc protrusion. No foraminal or central canal stenosis. MRI LUMBAR SPINE FINDINGS Segmentation:  Standard. Alignment:  Physiologic. Vertebrae: No acute fracture. No aggressive osseous lesion. Disc height loss at T12-L1 with minimal fluid signal in the disc space and increase bone marrow edema in the adjacent vertebral body endplates compared with MRI of the thoracic spine dated 04/10/2021, concerning for early discitis-osteomyelitis. No epidural fluid collection to suggest an abscess. No paravertebral fluid collection. Conus medullaris: Extends to the L1 level and appears normal. Paraspinal and other soft tissues: No acute paraspinal abnormality.  Disc levels: Disc spaces: Degenerative disease with disc height loss T12-L1. T12-L1: Small central disc protrusion. No foraminal or central canal stenosis. L1-L2: No significant disc bulge. No neural foraminal stenosis. No central canal stenosis. L2-L3: No significant disc bulge. No neural foraminal stenosis. No central canal stenosis. L3-L4: No significant disc bulge. No neural foraminal stenosis. No central canal stenosis. L4-L5: No significant disc bulge. No neural foraminal stenosis. No central canal stenosis. L5-S1: No significant disc bulge. No neural foraminal stenosis. No central canal stenosis. IMPRESSION: 1. Findings concerning for early discitis-osteomyelitis at T12-L1 with increase bone marrow edema in the adjacent vertebral body endplates compared with recent thoracic spine MRI dated 04/10/2021. No epidural or paravertebral fluid collection to suggest an abscess. 2. Mild thoracolumbar spondylosis as described above. Electronically Signed   By: Elige Ko M.D.   On: 05/13/2021 06:51   MR Lumbar Spine W Wo Contrast  Result Date: 05/13/2021 CLINICAL DATA:  Mid back pain, bacteremia, IV drug abuse EXAM: MRI THORACIC AND LUMBAR SPINE WITHOUT AND WITH CONTRAST TECHNIQUE: Multiplanar and multiecho pulse sequences of the thoracic and lumbar spine were obtained without and with intravenous contrast. CONTRAST:  6.76mL GADAVIST GADOBUTROL 1 MMOL/ML IV SOLN COMPARISON:  MRI thoracic spine 04/10/2021. FINDINGS: MRI THORACIC SPINE FINDINGS Alignment:  Physiologic. Vertebrae: No fracture, evidence of discitis, or bone lesion. Cord:  Normal signal and morphology. Paraspinal and other soft tissues: Negative. Disc levels: Disc spaces:  Degenerative disease with disc height loss at T8-9. T1-T2: No disc protrusion, foraminal stenosis or central canal stenosis. T2-T3: No disc protrusion, foraminal stenosis or central canal stenosis. T3-T4: No disc protrusion, foraminal stenosis or central canal stenosis. T4-T5: No disc  protrusion, foraminal stenosis or central canal stenosis. T5-T6: No disc protrusion, foraminal stenosis or central canal stenosis. T6-T7: No disc protrusion, foraminal stenosis or central canal stenosis. T7-T8: No disc protrusion, foraminal stenosis or central canal stenosis. T8-T9: Small right paracentral disc protrusion. No foraminal or central canal stenosis. T9-T10: No disc protrusion, foraminal stenosis or central canal stenosis. T10-T11: No disc protrusion, foraminal stenosis or central canal stenosis. T11-T12: Small left paracentral disc protrusion. No foraminal  or central canal stenosis. MRI LUMBAR SPINE FINDINGS Segmentation:  Standard. Alignment:  Physiologic. Vertebrae: No acute fracture. No aggressive osseous lesion. Disc height loss at T12-L1 with minimal fluid signal in the disc space and increase bone marrow edema in the adjacent vertebral body endplates compared with MRI of the thoracic spine dated 04/10/2021, concerning for early discitis-osteomyelitis. No epidural fluid collection to suggest an abscess. No paravertebral fluid collection. Conus medullaris: Extends to the L1 level and appears normal. Paraspinal and other soft tissues: No acute paraspinal abnormality. Disc levels: Disc spaces: Degenerative disease with disc height loss T12-L1. T12-L1: Small central disc protrusion. No foraminal or central canal stenosis. L1-L2: No significant disc bulge. No neural foraminal stenosis. No central canal stenosis. L2-L3: No significant disc bulge. No neural foraminal stenosis. No central canal stenosis. L3-L4: No significant disc bulge. No neural foraminal stenosis. No central canal stenosis. L4-L5: No significant disc bulge. No neural foraminal stenosis. No central canal stenosis. L5-S1: No significant disc bulge. No neural foraminal stenosis. No central canal stenosis. IMPRESSION: 1. Findings concerning for early discitis-osteomyelitis at T12-L1 with increase bone marrow edema in the adjacent vertebral  body endplates compared with recent thoracic spine MRI dated 04/10/2021. No epidural or paravertebral fluid collection to suggest an abscess. 2. Mild thoracolumbar spondylosis as described above. Electronically Signed   By: Elige Ko M.D.   On: 05/13/2021 06:51     LOS: 1 day   Jeoffrey Massed, MD  [facility]    To contact the attending provider between 7A-7P or the covering provider during after hours 7P-7A, please log into the web site [email or web address] and access using universal [place] password for that web site. If you do not have the password, please call the hospital operator.  05/14/2021, 9:23 AM

## 2021-05-15 LAB — BASIC METABOLIC PANEL
Anion gap: 8 (ref 5–15)
BUN: 5 mg/dL — ABNORMAL LOW (ref 6–20)
CO2: 27 mmol/L (ref 22–32)
Calcium: 9.1 mg/dL (ref 8.9–10.3)
Chloride: 102 mmol/L (ref 98–111)
Creatinine, Ser: 0.75 mg/dL (ref 0.61–1.24)
GFR, Estimated: >60 mL/min (ref >60)
Glucose, Bld: 109 mg/dL — ABNORMAL HIGH (ref 70–99)
Potassium: 3.8 mmol/L (ref 3.5–5.1)
Sodium: 137 mmol/L (ref 135–145)

## 2021-05-15 LAB — CBC
HCT: 30.7 % — ABNORMAL LOW (ref 39.0–52.0)
Hemoglobin: 10.1 g/dL — ABNORMAL LOW (ref 13.0–17.0)
MCH: 28.7 pg (ref 26.0–34.0)
MCHC: 32.9 g/dL (ref 30.0–36.0)
MCV: 87.2 fL (ref 80.0–100.0)
Platelets: 130 10*3/uL — ABNORMAL LOW (ref 150–400)
RBC: 3.52 MIL/uL — ABNORMAL LOW (ref 4.22–5.81)
RDW: 15.2 % (ref 11.5–15.5)
WBC: 8.2 10*3/uL (ref 4.0–10.5)
nRBC: 0 % (ref 0.0–0.2)

## 2021-05-15 LAB — CULTURE, BLOOD (ROUTINE X 2): Special Requests: ADEQUATE

## 2021-05-15 MED ORDER — FLEET ENEMA 7-19 GM/118ML RE ENEM: 1.0000 | ENEMA | Freq: Every day | RECTAL | Status: DC | PRN

## 2021-05-15 MED ORDER — HYDROMORPHONE HCL 1 MG/ML IJ SOLN
0.5000 mg | INTRAMUSCULAR | Status: DC | PRN
  Administered 2021-05-15 – 2021-05-16 (×4): 0.5 mg via INTRAVENOUS
  Filled 2021-05-15 (×4): qty 0.5

## 2021-05-15 MED ORDER — BISACODYL 10 MG RE SUPP: 10.0000 mg | Freq: Every day | RECTAL | Status: DC | PRN

## 2021-05-15 MED ORDER — METHADONE HCL 5 MG PO TABS
25.0000 mg | ORAL_TABLET | Freq: Two times a day (BID) | ORAL | Status: DC
  Administered 2021-05-15 – 2021-05-30 (×31): 25 mg via ORAL
  Filled 2021-05-15 (×31): qty 1

## 2021-05-15 NOTE — Progress Notes (Signed)
PROGRESS NOTE        PATIENT DETAILS Name: Duane Price Age: 35 y.o. Sex: male Date of Birth: 08-Oct-1985 Admit Date: 05/13/2021 Admitting Physician Clydie Braun, MD ION:[id number], No Pcp Per (Inactive)  Brief Narrative: Patient is a 35 y.o. 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  Acknowledges noncompliance to medications and ongoing IV heroin use.  Significant events: 7/1-8/1>> hospitalization for MRSA bacteremia with septic pulmonary emboli and tricuspid valve endocarditis-s/p angio vac debridement.  Discharged on oral linezolid 8/19>> admit for worsening back pain-found to have T12-L1 discitis.    Significant studies: 8/19>> MRI thoracolumbar spine: Early discitis/osteomyelitis T12-L1  Antimicrobial therapy: Vancomycin: 8/19>>  Microbiology data: 8/19>> blood culture : Staph aureus 8/21>> blood culture: Pending  Procedures : None  Consults: Infectious disease  DVT Prophylaxis : apixaban (ELIQUIS) tablet 5 mg   Subjective: Complains of severe back pain-not relieved by on methadone.   Assessment/Plan: Recurrent MRSA bacteremia-recent tricuspid valve endocarditis (s/p angio vac debridement on 7/5)-now with T12-L1 discitis in the setting of noncompliance of antimicrobial therapy and ongoing IV drug use: Continue IV vancomycin-await repeat cultures on 8/21.  Await further recommendations from infectious disease.  Continues to complain of back pain-increase methadone to 25 mg twice daily remains on oxycodone for breakthrough pain.  Minimize use of Dilaudid as much as possible-we will decrease dosage to 0.5 mg IV every 4 as needed with plans to stop it over the next day or so  COVID-19 infection: Clearly asymptomatic-maintain on isolation for 10  days.  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  Will need social work counseling when closer to discharge.  GERD: PPI  Homelessness  Diet: Diet Order             Diet regular Room service appropriate? Yes; Fluid consistency: Thin  Diet effective now                    Code Status: Full code   Family Communication: None at bedside  Disposition Plan: Status is: Inpatient  Remains inpatient appropriate because:Inpatient level of care appropriate due to severity of illness  Dispo: The patient is from: Home              Anticipated d/c is to: Home              Patient currently is not medically stable to d/c.   Difficult to place patient No   Barriers to Discharge: MRSA bacteremia-not a candidate for outpatient IV antimicrobial therapy.  Needs to remain inpatient until he clears the bacteremia.  Antimicrobial agents: Anti-infectives (From admission, onward)    Start     Dose/Rate Route Frequency Ordered Stop   05/13/21 1600  vancomycin (VANCOCIN) IVPB 1000 mg/200 mL premix        1,000 mg 200 mL/hr over 60 Minutes Intravenous Every 8 hours 05/13/21 0818     05/13/21 0715  vancomycin (VANCOREADY) IVPB 1250 mg/250 mL  1,250 mg 166.7 mL/hr over 90 Minutes Intravenous  Once 05/13/21 0655 05/13/21 0925        Time spent: 35 minutes-Greater than 50% of this time was spent in counseling, explanation of diagnosis, planning of further management, and coordination of care.  MEDICATIONS: Scheduled Meds:  apixaban  5 mg Oral BID   feeding supplement  237 mL Oral BID BM   lidocaine  1 patch Transdermal Q24H   melatonin  3 mg Oral QHS   methadone  25 mg Oral Q12H   nicotine  14 mg Transdermal Daily   pantoprazole  40  mg Oral Daily   polyethylene glycol  17 g Oral Daily   senna  2 tablet Oral QHS   sodium chloride flush  3 mL Intravenous Q12H   Continuous Infusions:  vancomycin 1,000 mg (05/15/21 0806)   PRN Meds:.acetaminophen **OR** acetaminophen, albuterol, HYDROmorphone (DILAUDID) injection, hydrOXYzine, naLOXone (NARCAN)  injection, ondansetron **OR** ondansetron (ZOFRAN) IV, oxyCODONE   PHYSICAL EXAM: Vital signs: Vitals:   05/15/21 0000 05/15/21 0400 05/15/21 0742 05/15/21 0803  BP: 105/87 123/72 123/72 134/82  Pulse: 66 72 67 90  Resp: 12 13 13 17   Temp: 97.9 F (36.6 C) 98.2 F (36.8 C) 98.1 F (36.7 C)   TempSrc: Oral Oral Oral   SpO2: 98% 99% 98% 97%  Weight:       Filed Weights   05/13/21 0700 05/14/21 0400  Weight: 70.9 kg 70.4 kg   Body mass index is 21.05 kg/m.   Gen Exam:Alert awake-not in any distress HEENT:atraumatic, normocephalic Chest: B/L clear to auscultation anteriorly CVS:S1S2 regular Abdomen:soft non tender, non distended Extremities:no edema Neurology: Non focal Skin: no rash   I have personally reviewed following labs and imaging studies  LABORATORY DATA: CBC: Recent Labs  Lab 05/13/21 0308 05/14/21 0040 05/15/21 0607  WBC 11.8* 11.0* 8.2  NEUTROABS 10.2*  --   --   HGB 8.9* 9.2* 10.1*  HCT 28.1* 28.3* 30.7*  MCV 90.6 86.5 87.2  PLT 268 299 130*     Basic Metabolic Panel: Recent Labs  Lab 05/13/21 0308 05/14/21 0040 05/15/21 0607  NA 134* 131* 137  K 3.9 3.9 3.8  CL 98 97* 102  CO2 29 26 27   GLUCOSE 152* 142* 109*  BUN <5* 13 5*  CREATININE 0.66 0.83 0.75  CALCIUM 8.8* 8.5* 9.1     GFR: Estimated Creatinine Clearance: 130.8 mL/min (by C-G formula based on SCr of 0.75 mg/dL).  Liver Function Tests: Recent Labs  Lab 05/14/21 0040  AST 12*  ALT 11  ALKPHOS 66  BILITOT 0.3  PROT 6.5  ALBUMIN 2.3*    No results for input(s): LIPASE, AMYLASE in the last 168 hours. No results for input(s): AMMONIA in the last 168  hours.  Coagulation Profile: No results for input(s): INR, PROTIME in the last 168 hours.  Cardiac Enzymes: No results for input(s): CKTOTAL, CKMB, CKMBINDEX, TROPONINI in the last 168 hours.  BNP (last 3 results) No results for input(s): PROBNP in the last 8760 hours.  Lipid Profile: No results for input(s): CHOL, HDL, LDLCALC, TRIG, CHOLHDL, LDLDIRECT in the last 72 hours.  Thyroid Function Tests: No results for input(s): TSH, T4TOTAL, FREET4, T3FREE, THYROIDAB in the last 72 hours.  Anemia Panel: No results for input(s): VITAMINB12, FOLATE, FERRITIN, TIBC, IRON, RETICCTPCT in the last 72 hours.  Urine analysis:    Component Value Date/Time   COLORURINE YELLOW 04/19/2021 1546   APPEARANCEUR HAZY (A) 04/19/2021 1546   LABSPEC  1.021 04/19/2021 1546   PHURINE 5.0 04/19/2021 1546   GLUCOSEU NEGATIVE 04/19/2021 1546   HGBUR LARGE (A) 04/19/2021 1546   BILIRUBINUR NEGATIVE 04/19/2021 1546   KETONESUR NEGATIVE 04/19/2021 1546   PROTEINUR 30 (A) 04/19/2021 1546   UROBILINOGEN 1.0 02/09/2010 2121   NITRITE NEGATIVE 04/19/2021 1546   LEUKOCYTESUR NEGATIVE 04/19/2021 1546    Sepsis Labs: Lactic Acid, Venous    Component Value Date/Time   LATICACIDVEN 0.8 05/13/2021 0310    MICROBIOLOGY: Recent Results (from the past 240 hour(s))  Blood culture (routine x 2)     Status: Abnormal   Collection Time: 05/13/21  3:08 AM   Specimen: BLOOD RIGHT HAND  Result Value Ref Range Status   Specimen Description BLOOD RIGHT HAND  Final   Special Requests   Final    BOTTLES DRAWN AEROBIC AND ANAEROBIC Blood Culture adequate volume   Culture  Setup Time   Final    GRAM POSITIVE COCCI IN CLUSTERS IN BOTH AEROBIC AND ANAEROBIC BOTTLES CRITICAL RESULT CALLED TO, READ BACK BY AND VERIFIED WITHArnaldo Natal [facility] 2116 05/13/21 A BROWNING Performed at [facility], [address], [place], [place] [address]    Culture METHICILLIN RESISTANT STAPHYLOCOCCUS AUREUS (A)  Final   Report Status  05/15/2021 FINAL  Final   Organism ID, Bacteria METHICILLIN RESISTANT STAPHYLOCOCCUS AUREUS  Final      Susceptibility   Methicillin resistant staphylococcus aureus - MIC*    CIPROFLOXACIN >=8 RESISTANT Resistant     ERYTHROMYCIN >=8 RESISTANT Resistant     GENTAMICIN <=0.5 SENSITIVE Sensitive     OXACILLIN >=4 RESISTANT Resistant     TETRACYCLINE <=1 SENSITIVE Sensitive     VANCOMYCIN <=0.5 SENSITIVE Sensitive     TRIMETH/SULFA <=10 SENSITIVE Sensitive     CLINDAMYCIN <=0.25 SENSITIVE Sensitive     RIFAMPIN <=0.5 SENSITIVE Sensitive     Inducible Clindamycin NEGATIVE Sensitive     * METHICILLIN RESISTANT STAPHYLOCOCCUS AUREUS  Blood Culture ID Panel (Reflexed)     Status: Abnormal   Collection Time: 05/13/21  3:08 AM  Result Value Ref Range Status   Enterococcus faecalis NOT DETECTED NOT DETECTED Final   Enterococcus Faecium NOT DETECTED NOT DETECTED Final   Listeria monocytogenes NOT DETECTED NOT DETECTED Final   Staphylococcus species DETECTED (A) NOT DETECTED Final    Comment: CRITICAL RESULT CALLED TO, READ BACK BY AND VERIFIED WITH: Arnaldo Natal [facility] 2116 05/13/21 A BROWNING    Staphylococcus aureus (BCID) DETECTED (A) NOT DETECTED Final    Comment: Methicillin (oxacillin)-resistant Staphylococcus aureus (MRSA). MRSA is predictably resistant to beta-lactam antibiotics (except ceftaroline). Preferred therapy is vancomycin unless clinically contraindicated. Patient requires contact precautions if  hospitalized. CRITICAL RESULT CALLED TO, READ BACK BY AND VERIFIED WITH: Arnaldo Natal [facility] 2116 05/13/21 A BROWNING    Staphylococcus epidermidis NOT DETECTED NOT DETECTED Final   Staphylococcus lugdunensis NOT DETECTED NOT DETECTED Final   Streptococcus species NOT DETECTED NOT DETECTED Final   Streptococcus agalactiae NOT DETECTED NOT DETECTED Final   Streptococcus pneumoniae NOT DETECTED NOT DETECTED Final   Streptococcus pyogenes NOT DETECTED NOT DETECTED Final    A.calcoaceticus-baumannii NOT DETECTED NOT DETECTED Final   Bacteroides fragilis NOT DETECTED NOT DETECTED Final   Enterobacterales NOT DETECTED NOT DETECTED Final   Enterobacter cloacae complex NOT DETECTED NOT DETECTED Final   Escherichia coli NOT DETECTED NOT DETECTED Final   Klebsiella aerogenes NOT DETECTED NOT DETECTED Final   Klebsiella oxytoca NOT DETECTED NOT DETECTED Final   Klebsiella  pneumoniae NOT DETECTED NOT DETECTED Final   Proteus species NOT DETECTED NOT DETECTED Final   Salmonella species NOT DETECTED NOT DETECTED Final   Serratia marcescens NOT DETECTED NOT DETECTED Final   Haemophilus influenzae NOT DETECTED NOT DETECTED Final   Neisseria meningitidis NOT DETECTED NOT DETECTED Final   Pseudomonas aeruginosa NOT DETECTED NOT DETECTED Final   Stenotrophomonas maltophilia NOT DETECTED NOT DETECTED Final   Candida albicans NOT DETECTED NOT DETECTED Final   Candida auris NOT DETECTED NOT DETECTED Final   Candida glabrata NOT DETECTED NOT DETECTED Final   Candida krusei NOT DETECTED NOT DETECTED Final   Candida parapsilosis NOT DETECTED NOT DETECTED Final   Candida tropicalis NOT DETECTED NOT DETECTED Final   Cryptococcus neoformans/gattii NOT DETECTED NOT DETECTED Final   Meth resistant mecA/C and MREJ DETECTED (A) NOT DETECTED Final    Comment: CRITICAL RESULT CALLED TO, READ BACK BY AND VERIFIED WITHArnaldo Natal [facility] 2116 05/13/21 A BROWNING Performed at [facility], [address], [place], [place] [address]   Blood culture (routine x 2)     Status: Abnormal (Preliminary result)   Collection Time: 05/13/21  3:09 AM   Specimen: BLOOD LEFT HAND  Result Value Ref Range Status   Specimen Description BLOOD LEFT HAND  Final   Special Requests   Final    BOTTLES DRAWN AEROBIC AND ANAEROBIC Blood Culture adequate volume   Culture  Setup Time   Final    GRAM POSITIVE COCCI IN CLUSTERS AEROBIC BOTTLE ONLY CRITICAL VALUE NOTED.  VALUE IS CONSISTENT WITH PREVIOUSLY  REPORTED AND CALLED VALUE.    Culture (A)  Final    STAPHYLOCOCCUS AUREUS SUSCEPTIBILITIES PERFORMED ON PREVIOUS CULTURE WITHIN THE LAST 5 DAYS. Performed at [facility], [address], [place], [place] [address]    Report Status PENDING  Incomplete  Resp Panel by RT-PCR (Flu A&B, Covid) Nasopharyngeal Swab     Status: Abnormal   Collection Time: 05/13/21  8:07 AM   Specimen: Nasopharyngeal Swab; Nasopharyngeal(NP) swabs in vial transport medium  Result Value Ref Range Status   SARS Coronavirus 2 by RT PCR POSITIVE (A) NEGATIVE Final    Comment: RESULT CALLED TO, READ BACK BY AND VERIFIED WITH: RN Cherlyn Labella [id number] AT 1016 BY CM (NOTE) SARS-CoV-2 target nucleic acids are DETECTED.  The SARS-CoV-2 RNA is generally detectable in upper respiratory specimens during the acute phase of infection. Positive results are indicative of the presence of the identified virus, but do not rule out bacterial infection or co-infection with other pathogens not detected by the test. Clinical correlation with patient history and other diagnostic information is necessary to determine patient infection status. The expected result is Negative.  Fact Sheet for Patients: BloggerCourse.com  Fact Sheet for Healthcare Providers: SeriousBroker.it  This test is not yet approved or cleared by the [place] FDA and  has been authorized for detection and/or diagnosis of SARS-CoV-2 by FDA under an Emergency Use Authorization (EUA).  This EUA will remain in effect (meaning this test can b e used) for the duration of  the COVID-19 declaration under Section 564(b)(1) of the Act, 21 U.S.C. section 360bbb-3(b)(1), unless the authorization is terminated or revoked sooner.     Influenza A by PCR NEGATIVE NEGATIVE Final   Influenza B by PCR NEGATIVE NEGATIVE Final    Comment: (NOTE) The Xpert Xpress SARS-CoV-2/FLU/RSV plus assay is intended as an aid in  the diagnosis of influenza from Nasopharyngeal swab specimens and should not  be used as a sole basis for treatment. Nasal washings and aspirates are unacceptable for Xpert Xpress SARS-CoV-2/FLU/RSV testing.  Fact Sheet for Patients: BloggerCourse.com  Fact Sheet for Healthcare Providers: SeriousBroker.it  This test is not yet approved or cleared by the [place] FDA and has been authorized for detection and/or diagnosis of SARS-CoV-2 by FDA under an Emergency Use Authorization (EUA). This EUA will remain in effect (meaning this test can be used) for the duration of the COVID-19 declaration under Section 564(b)(1) of the Act, 21 U.S.C. section 360bbb-3(b)(1), unless the authorization is terminated or revoked.  Performed at [facility], [address] [place], [place] [address]     RADIOLOGY STUDIES/RESULTS: No results found.   LOS: 2 days   Jeoffrey Massed, MD  [facility]    To contact the attending provider between 7A-7P or the covering provider during after hours 7P-7A, please log into the web site [email or web address] and access using universal [place] password for that web site. If you do not have the password, please call the hospital operator.  05/15/2021, 11:56 AM

## 2021-05-16 LAB — CBC
HCT: 30.1 % — ABNORMAL LOW (ref 39.0–52.0)
Hemoglobin: 9.8 g/dL — ABNORMAL LOW (ref 13.0–17.0)
MCH: 28.1 pg (ref 26.0–34.0)
MCHC: 32.6 g/dL (ref 30.0–36.0)
MCV: 86.2 fL (ref 80.0–100.0)
Platelets: 334 10*3/uL (ref 150–400)
RBC: 3.49 MIL/uL — ABNORMAL LOW (ref 4.22–5.81)
RDW: 15.1 % (ref 11.5–15.5)
WBC: 7.8 10*3/uL (ref 4.0–10.5)
nRBC: 0 % (ref 0.0–0.2)

## 2021-05-16 LAB — BASIC METABOLIC PANEL
Anion gap: 11 (ref 5–15)
BUN: 6 mg/dL (ref 6–20)
CO2: 28 mmol/L (ref 22–32)
Calcium: 8.7 mg/dL — ABNORMAL LOW (ref 8.9–10.3)
Chloride: 97 mmol/L — ABNORMAL LOW (ref 98–111)
Creatinine, Ser: 0.86 mg/dL (ref 0.61–1.24)
GFR, Estimated: >60 mL/min (ref >60)
Glucose, Bld: 89 mg/dL (ref 70–99)
Potassium: 3.9 mmol/L (ref 3.5–5.1)
Sodium: 136 mmol/L (ref 135–145)

## 2021-05-16 LAB — CULTURE, BLOOD (ROUTINE X 2): Special Requests: ADEQUATE

## 2021-05-16 LAB — VANCOMYCIN, PEAK: Vancomycin Pk: 39 ug/mL (ref 30–40)

## 2021-05-16 LAB — VANCOMYCIN, TROUGH: Vancomycin Tr: 19 ug/mL (ref 15–20)

## 2021-05-16 MED ORDER — VANCOMYCIN HCL IN DEXTROSE 1-5 GM/200ML-% IV SOLN
1000.0000 mg | Freq: Two times a day (BID) | INTRAVENOUS | Status: DC
  Administered 2021-05-16 – 2021-05-19 (×7): 1000 mg via INTRAVENOUS
  Filled 2021-05-16 (×8): qty 200

## 2021-05-16 NOTE — Progress Notes (Signed)
[facility]    Date of Admission:  05/13/2021   Total days of antibiotics 4           ID: Duane Price is a 35 y.o. male with  MRSA bacteremia and discitis Principal Problem:   Discitis, unspecified, thoracolumbar region Active Problems:   Normocytic anemia   IV drug abuse (HCC)   Endocarditis of tricuspid valve   HCV antibody positive   Osteomyelitis (HCC)   Leukocytosis   History of bacteremia   History of pulmonary embolus (PE)   Homeless   Polysubstance abuse (HCC)    Subjective: Afebrile, improvement in back pain  12 point ros is negative  Medications:   apixaban  5 mg Oral BID   feeding supplement  237 mL Oral BID BM   lidocaine  1 patch Transdermal Q24H   melatonin  3 mg Oral QHS   methadone  25 mg Oral Q12H   nicotine  14 mg Transdermal Daily   pantoprazole  40 mg Oral Daily   polyethylene glycol  17 g Oral Daily   senna  2 tablet Oral QHS   sodium chloride flush  3 mL Intravenous Q12H    Objective: Vital signs in last 24 hours: Temp:  [97.7 F (36.5 C)-98.7 F (37.1 C)] 98.2 F (36.8 C) (08/22 1207) Pulse Rate:  [71-88] 76 (08/22 1207) Resp:  [11-18] 15 (08/22 1207) BP: (102-108)/(59-64) 105/62 (08/22 1207) SpO2:  [96 %-99 %] 96 % (08/22 1207) Physical Exam  Constitutional: He is oriented to person, place, and time. He appears well-developed and well-nourished. No distress.  HENT:  Mouth/Throat: Oropharynx is clear and moist. No oropharyngeal exudate.  Cardiovascular: Normal rate, regular rhythm and normal heart sounds. Exam reveals no gallop and no friction rub.  No murmur heard.  Pulmonary/Chest: Effort normal and breath sounds normal. No respiratory distress. He has no wheezes.  Abdominal: Soft. Bowel sounds are normal. He exhibits no distension. There is no tenderness.  Lymphadenopathy:  He has no cervical adenopathy.  Neurological: He is alert and oriented to person, place, and time.  Skin: Skin is warm and  dry. No rash noted. No erythema.  Psychiatric: He has a normal mood and affect. His behavior is normal.   Lab Results Recent Labs    05/15/21 0607 05/16/21 0845  WBC 8.2 7.8  HGB 10.1* 9.8*  HCT 30.7* 30.1*  NA 137 136  K 3.8 3.9  CL 102 97*  CO2 27 28  BUN 5* 6  CREATININE 0.75 0.86   Liver Panel Recent Labs    05/14/21 0040  PROT 6.5  ALBUMIN 2.3*  AST 12*  ALT 11  ALKPHOS 66  BILITOT 0.3   Sedimentation Rate No results for input(s): ESRSEDRATE in the last 72 hours. C-Reactive Protein No results for input(s): CRP in the last 72 hours.  Microbiology: reviewed Studies/Results: No results found.   Assessment/Plan: Complicated MRSA bacteremia with thoracolumbar discitis = previously had been receiving treatment for MRSA TV endocarditis but did not finish his oral linezolid abtx when noticing having increasing back pain. On readmission, found to have early discitis and recurrent MRSA bacteremia. He reports that his back pain is not as bad as on admission.   - continue on vancomycin, length of therapy will be prolonged, he will need to be hospitalized for at least 16 days since admission. - recommend repeat TTE sometime this week  TV endocarditis = receiving vacomycin to complete course  Asymptomatic covid-19 =  continue on airborne/contact. Can consider to repeat testing on day 7 if he remains asymptomatic to discontinue isolation  [facility] Cell: [phone] Pager: [phone]  05/16/2021, 2:17 PM

## 2021-05-16 NOTE — Progress Notes (Signed)
Pharmacy Antibiotic Note  Duane Price is a 35 y.o. male admitted on 05/13/2021 presenting with back pain, MRI showing early discitis.  Hx of MRSA bacteremia and tricuspid endocarditis with emboli to lungs and patient did not complete his oral Zyvox course.  Pharmacy has been consulted for vancomycin dosing.    Renal function stable and vancomycin AUC is supra-therapeutic at 719 (goal 400-550).  Afebrile, WBC normalized.  Plan: Reduce vanc to 1gm IV Q12H for AUC 479 Monitor renal fxn, clinical progress, weekly vanc levels  Weight: 70.4 kg (155 lb 3.3 oz)  Temp (24hrs), Avg:98.1 F (36.7 C), Min:97.7 F (36.5 C), Max:98.3 F (36.8 C)  Recent Labs  Lab 05/13/21 0308 05/13/21 0310 05/14/21 0040 05/15/21 0607 05/16/21 0845 05/16/21 1112 05/16/21 1631  WBC 11.8*  --  11.0* 8.2 7.8  --   --   CREATININE 0.66  --  0.83 0.75 0.86  --   --   LATICACIDVEN  --  0.8  --   --   --   --   --   VANCOTROUGH  --   --   --   --   --   --  19  VANCOPEAK  --   --   --   --   --  39  --      Estimated Creatinine Clearance: 121.7 mL/min (by C-G formula based on SCr of 0.86 mg/dL).    No Known Allergies  Vanc 8/19>>   8/22 VP/VT 39/19, AUC 719 on 1g q8 >> reduce to 1g q12   8/19 BCx: MRSA  8/21 BCx: NGTD  Zidane Renner D. Laney Potash, PharmD, BCPS, BCCCP 05/16/2021, 6:24 PM

## 2021-05-16 NOTE — Progress Notes (Signed)
PROGRESS NOTE        PATIENT DETAILS Name: Duane Price Age: 35 y.o. Sex: male Date of Birth: 1985-12-03 Admit Date: 05/13/2021 Admitting Physician Clydie Braunondell A Smith, MD ZOX:[id number]:Patient, No Pcp Per (Inactive)  Brief Narrative: Patient is a 35 y.o. 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  Acknowledges noncompliance to medications and ongoing IV heroin use.  Significant events: 7/1-8/1>> hospitalization for MRSA bacteremia with septic pulmonary emboli and tricuspid valve endocarditis-s/p angio vac debridement.  Discharged on oral linezolid 8/19>> admit for worsening back pain-found to have T12-L1 discitis.    Significant studies: 8/19>> MRI thoracolumbar spine: Early discitis/osteomyelitis T12-L1  Antimicrobial therapy: Vancomycin: 8/19>>  Microbiology data: 8/19>> blood culture : MRSA  8/21>> blood culture: No growth  Procedures : None  Consults: Infectious disease  DVT Prophylaxis : apixaban (ELIQUIS) tablet 5 mg   Subjective: Lying comfortably in bed-some back pain but stable on narcotic regimen.   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  Await further recommendations from ID.  Continue current dosing of methadone-continue to use oxycodone for breakthrough pain.  Stop Dilaudid today.    COVID-19 infection: Clearly asymptomatic-maintain on isolation for 10 days.  Do not think he has an active infection-do not think he requires any COVID-19 specific treatment at this point  History of PE: On  Eliquis  Normocytic anemia: No evidence of blood loss-likely due to acute illness/smoldering infection.  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  Will need social work counseling when closer to discharge.  GERD: PPI  Homelessness  Diet: Diet Order             Diet regular Room service appropriate? Yes; Fluid consistency: Thin  Diet effective now                    Code Status: Full code   Family Communication: None at bedside  Disposition Plan: Status is: Inpatient  Remains inpatient appropriate because:Inpatient level of care appropriate due to severity of illness  Dispo: The patient is from: Home              Anticipated d/c is to: Home              Patient currently is not medically stable to d/c.   Difficult to place patient No   Barriers to Discharge: MRSA bacteremia-not a candidate for outpatient IV antimicrobial therapy.  Needs to remain inpatient until he clears the bacteremia.  Antimicrobial agents: Anti-infectives (From admission, onward)    Start     Dose/Rate Route Frequency Ordered Stop   05/13/21 1600  vancomycin (VANCOCIN) IVPB 1000 mg/200 mL premix        1,000 mg 200 mL/hr over 60 Minutes Intravenous Every 8 hours 05/13/21 0818     05/13/21 0715  vancomycin (VANCOREADY) IVPB 1250 mg/250 mL        1,250 mg 166.7 mL/hr over 90 Minutes Intravenous  Once 05/13/21 0655 05/13/21 0925        Time spent: 25 minutes-Greater than 50% of  this time was spent in counseling, explanation of diagnosis, planning of further management, and coordination of care.  MEDICATIONS: Scheduled Meds:  apixaban  5 mg Oral BID   feeding supplement  237 mL Oral BID BM   lidocaine  1 patch Transdermal Q24H   melatonin  3 mg Oral QHS   methadone  25 mg Oral Q12H   nicotine  14 mg Transdermal Daily   pantoprazole  40 mg Oral Daily   polyethylene glycol  17 g Oral Daily   senna  2 tablet Oral QHS   sodium chloride flush  3 mL Intravenous Q12H    Continuous Infusions:  vancomycin 1,000 mg (05/16/21 0900)   PRN Meds:.acetaminophen **OR** acetaminophen, albuterol, bisacodyl, HYDROmorphone (DILAUDID) injection, hydrOXYzine, naLOXone (NARCAN)  injection, ondansetron **OR** ondansetron (ZOFRAN) IV, oxyCODONE, sodium phosphate   PHYSICAL EXAM: Vital signs: Vitals:   05/16/21 0400 05/16/21 0736 05/16/21 1100 05/16/21 1207  BP: 107/64 102/62  105/62  Pulse: 71 76  76  Resp: Temp: 98.1 F (36.7 C) 97.7 F (36.5 C)  98.2 F (36.8 C)  TempSrc: Axillary Oral  Oral  SpO2: 99% 97% 96% 96%  Weight:       Filed Weights   05/13/21 0700 05/14/21 0400  Weight: 70.9 kg 70.4 kg   Body mass index is 21.05 kg/m.   Gen Exam:Alert awake-not in any distress HEENT:atraumatic, normocephalic Chest: B/L clear to auscultation anteriorly CVS:S1S2 regular Abdomen:soft non tender, non distended Extremities:no edema Neurology: Non focal Skin: no rash   I have personally reviewed following labs and imaging studies  LABORATORY DATA: CBC: Recent Labs  Lab 05/13/21 0308 05/14/21 0040 05/15/21 0607 05/16/21 0845  WBC 11.8* 11.0* 8.2 7.8  NEUTROABS 10.2*  --   --   --   HGB 8.9* 9.2* 10.1* 9.8*  HCT 28.1* 28.3* 30.7* 30.1*  MCV 90.6 86.5 87.2 86.2  PLT 268 299 130* 334     Basic Metabolic Panel: Recent Labs  Lab 05/13/21 0308 05/14/21 0040 05/15/21 0607 05/16/21 0845  NA 134* 131* 137 136  K 3.9 3.9 3.8 3.9  CL 98 97* 102 97*  CO2 GLUCOSE 152* 142* 109* 89  BUN <5* 13 5* 6  CREATININE 0.66 0.83 0.75 0.86  CALCIUM 8.8* 8.5* 9.1 8.7*     GFR: Estimated Creatinine Clearance: 121.7 mL/min (by C-G formula based on SCr of 0.86 mg/dL).  Liver Function Tests: Recent Labs  Lab 05/14/21 0040  AST 12*  ALT 11  ALKPHOS 66  BILITOT 0.3  PROT 6.5  ALBUMIN 2.3*    No results for input(s): LIPASE, AMYLASE in the last 168 hours. No results for input(s): AMMONIA in the last 168 hours.  Coagulation  Profile: No results for input(s): INR, PROTIME in the last 168 hours.  Cardiac Enzymes: No results for input(s): CKTOTAL, CKMB, CKMBINDEX, TROPONINI in the last 168 hours.  BNP (last 3 results) No results for input(s): PROBNP in the last 8760 hours.  Lipid Profile: No results for input(s): CHOL, HDL, LDLCALC, TRIG, CHOLHDL, LDLDIRECT in the last 72 hours.  Thyroid Function Tests: No results for input(s): TSH, T4TOTAL, FREET4, T3FREE, THYROIDAB in the last 72 hours.  Anemia Panel: No results for input(s): VITAMINB12, FOLATE, FERRITIN, TIBC, IRON, RETICCTPCT in the last 72 hours.  Urine analysis:    Component Value Date/Time   COLORURINE YELLOW 04/19/2021 1546   APPEARANCEUR HAZY (A) 04/19/2021 1546   LABSPEC 1.021 04/19/2021 1546  PHURINE 5.0 04/19/2021 1546   GLUCOSEU NEGATIVE 04/19/2021 1546   HGBUR LARGE (A) 04/19/2021 1546   BILIRUBINUR NEGATIVE 04/19/2021 1546   KETONESUR NEGATIVE 04/19/2021 1546   PROTEINUR 30 (A) 04/19/2021 1546   UROBILINOGEN 1.0 02/09/2010 2121   NITRITE NEGATIVE 04/19/2021 1546   LEUKOCYTESUR NEGATIVE 04/19/2021 1546    Sepsis Labs: Lactic Acid, Venous    Component Value Date/Time   LATICACIDVEN 0.8 05/13/2021 0310    MICROBIOLOGY: Recent Results (from the past 240 hour(s))  Blood culture (routine x 2)     Status: Abnormal   Collection Time: 05/13/21  3:08 AM   Specimen: BLOOD RIGHT HAND  Result Value Ref Range Status   Specimen Description BLOOD RIGHT HAND  Final   Special Requests   Final    BOTTLES DRAWN AEROBIC AND ANAEROBIC Blood Culture adequate volume   Culture  Setup Time   Final    GRAM POSITIVE COCCI IN CLUSTERS IN BOTH AEROBIC AND ANAEROBIC BOTTLES CRITICAL RESULT CALLED TO, READ BACK BY AND VERIFIED WITHArnaldo Natal [facility] 2116 05/13/21 A BROWNING Performed at [facility], [address], [place], [place] [address]    Culture METHICILLIN RESISTANT STAPHYLOCOCCUS AUREUS (A)  Final   Report Status 05/15/2021 FINAL   Final   Organism ID, Bacteria METHICILLIN RESISTANT STAPHYLOCOCCUS AUREUS  Final      Susceptibility   Methicillin resistant staphylococcus aureus - MIC*    CIPROFLOXACIN >=8 RESISTANT Resistant     ERYTHROMYCIN >=8 RESISTANT Resistant     GENTAMICIN <=0.5 SENSITIVE Sensitive     OXACILLIN >=4 RESISTANT Resistant     TETRACYCLINE <=1 SENSITIVE Sensitive     VANCOMYCIN <=0.5 SENSITIVE Sensitive     TRIMETH/SULFA <=10 SENSITIVE Sensitive     CLINDAMYCIN <=0.25 SENSITIVE Sensitive     RIFAMPIN <=0.5 SENSITIVE Sensitive     Inducible Clindamycin NEGATIVE Sensitive     * METHICILLIN RESISTANT STAPHYLOCOCCUS AUREUS  Blood Culture ID Panel (Reflexed)     Status: Abnormal   Collection Time: 05/13/21  3:08 AM  Result Value Ref Range Status   Enterococcus faecalis NOT DETECTED NOT DETECTED Final   Enterococcus Faecium NOT DETECTED NOT DETECTED Final   Listeria monocytogenes NOT DETECTED NOT DETECTED Final   Staphylococcus species DETECTED (A) NOT DETECTED Final    Comment: CRITICAL RESULT CALLED TO, READ BACK BY AND VERIFIED WITH: Arnaldo Natal PHAR[facility] 2116 05/13/21 A BROWNING    Staphylococcus aureus (BCID) DETECTED (A) NOT DETECTED Final    Comment: Methicillin (oxacillin)-resistant Staphylococcus aureus (MRSA). MRSA is predictably resistant to beta-lactam antibiotics (except ceftaroline). Preferred therapy is vancomycin unless clinically contraindicated. Patient requires contact precautions if  hospitalized. CRITICAL RESULT CALLED TO, READ BACK BY AND VERIFIED WITH: Arnaldo Natal PHARMD 2116 05/13/21 A BROWNING    Staphylococcus epidermidis NOT DETECTED NOT DETECTED Final   Staphylococcus lugdunensis NOT DETECTED NOT DETECTED Final   Streptococcus species NOT DETECTED NOT DETECTED Final   Streptococcus agalactiae NOT DETECTED NOT DETECTED Final   Streptococcus pneumoniae NOT DETECTED NOT DETECTED Final   Streptococcus pyogenes NOT DETECTED NOT DETECTED Final   A.calcoaceticus-baumannii NOT DETECTED  NOT DETECTED Final   Bacteroides fragilis NOT DETECTED NOT DETECTED Final   Enterobacterales NOT DETECTED NOT DETECTED Final   Enterobacter cloacae complex NOT DETECTED NOT DETECTED Final   Escherichia coli NOT DETECTED NOT DETECTED Final   Klebsiella aerogenes NOT DETECTED NOT DETECTED Final   Klebsiella oxytoca NOT DETECTED NOT DETECTED Final   Klebsiella pneumoniae NOT DETECTED NOT DETECTED  Final   Proteus species NOT DETECTED NOT DETECTED Final   Salmonella species NOT DETECTED NOT DETECTED Final   Serratia marcescens NOT DETECTED NOT DETECTED Final   Haemophilus influenzae NOT DETECTED NOT DETECTED Final   Neisseria meningitidis NOT DETECTED NOT DETECTED Final   Pseudomonas aeruginosa NOT DETECTED NOT DETECTED Final   Stenotrophomonas maltophilia NOT DETECTED NOT DETECTED Final   Candida albicans NOT DETECTED NOT DETECTED Final   Candida auris NOT DETECTED NOT DETECTED Final   Candida glabrata NOT DETECTED NOT DETECTED Final   Candida krusei NOT DETECTED NOT DETECTED Final   Candida parapsilosis NOT DETECTED NOT DETECTED Final   Candida tropicalis NOT DETECTED NOT DETECTED Final   Cryptococcus neoformans/gattii NOT DETECTED NOT DETECTED Final   Meth resistant mecA/C and MREJ DETECTED (A) NOT DETECTED Final    Comment: CRITICAL RESULT CALLED TO, READ BACK BY AND VERIFIED WITHArnaldo Natal [facility] 2116 05/13/21 A BROWNING Performed at [facility], [address], [place], [place] [address]   Blood culture (routine x 2)     Status: Abnormal   Collection Time: 05/13/21  3:09 AM   Specimen: BLOOD LEFT HAND  Result Value Ref Range Status   Specimen Description BLOOD LEFT HAND  Final   Special Requests   Final    BOTTLES DRAWN AEROBIC AND ANAEROBIC Blood Culture adequate volume   Culture  Setup Time   Final    GRAM POSITIVE COCCI IN CLUSTERS AEROBIC BOTTLE ONLY CRITICAL VALUE NOTED.  VALUE IS CONSISTENT WITH PREVIOUSLY REPORTED AND CALLED VALUE.    Culture (A)  Final     STAPHYLOCOCCUS AUREUS SUSCEPTIBILITIES PERFORMED ON PREVIOUS CULTURE WITHIN THE LAST 5 DAYS. Performed at [facility], [address] [place], [place] [address]    Report Status 05/16/2021 FINAL  Final  Resp Panel by RT-PCR (Flu A&B, Covid) Nasopharyngeal Swab     Status: Abnormal   Collection Time: 05/13/21  8:07 AM   Specimen: Nasopharyngeal Swab; Nasopharyngeal(NP) swabs in vial transport medium  Result Value Ref Range Status   SARS Coronavirus 2 by RT PCR POSITIVE (A) NEGATIVE Final    Comment: RESULT CALLED TO, READ BACK BY AND VERIFIED WITH: RN Cherlyn Labella [id number] AT 1016 BY CM (NOTE) SARS-CoV-2 target nucleic acids are DETECTED.  The SARS-CoV-2 RNA is generally detectable in upper respiratory specimens during the acute phase of infection. Positive results are indicative of the presence of the identified virus, but do not rule out bacterial infection or co-infection with other pathogens not detected by the test. Clinical correlation with patient history and other diagnostic information is necessary to determine patient infection status. The expected result is Negative.  Fact Sheet for Patients: BloggerCourse.com  Fact Sheet for Healthcare Providers: SeriousBroker.it  This test is not yet approved or cleared by the [place] FDA and  has been authorized for detection and/or diagnosis of SARS-CoV-2 by FDA under an Emergency Use Authorization (EUA).  This EUA will remain in effect (meaning this test can b e used) for the duration of  the COVID-19 declaration under Section 564(b)(1) of the Act, 21 U.S.C. section 360bbb-3(b)(1), unless the authorization is terminated or revoked sooner.     Influenza A by PCR NEGATIVE NEGATIVE Final   Influenza B by PCR NEGATIVE NEGATIVE Final    Comment: (NOTE) The Xpert Xpress SARS-CoV-2/FLU/RSV plus assay is intended as an aid in the diagnosis of influenza from Nasopharyngeal swab  specimens and should not be used as a sole basis  for treatment. Nasal washings and aspirates are unacceptable for Xpert Xpress SARS-CoV-2/FLU/RSV testing.  Fact Sheet for Patients: BloggerCourse.com  Fact Sheet for Healthcare Providers: SeriousBroker.it  This test is not yet approved or cleared by the [place] FDA and has been authorized for detection and/or diagnosis of SARS-CoV-2 by FDA under an Emergency Use Authorization (EUA). This EUA will remain in effect (meaning this test can be used) for the duration of the COVID-19 declaration under Section 564(b)(1) of the Act, 21 U.S.C. section 360bbb-3(b)(1), unless the authorization is terminated or revoked.  Performed at [facility], [address] [place], [place] [address]   Culture, blood (routine x 2)     Status: None (Preliminary result)   Collection Time: 05/15/21  6:07 AM   Specimen: BLOOD  Result Value Ref Range Status   Specimen Description BLOOD BLOOD LEFT HAND  Final   Special Requests   Final    AEROBIC BOTTLE ONLY Blood Culture results may not be optimal due to an inadequate volume of blood received in culture bottles   Culture   Final    NO GROWTH 1 DAY Performed at [facility], [address] [place], [place] [address]    Report Status PENDING  Incomplete  Culture, blood (routine x 2)     Status: None (Preliminary result)   Collection Time: 05/15/21  6:07 AM   Specimen: BLOOD  Result Value Ref Range Status   Specimen Description BLOOD BLOOD RIGHT HAND  Final   Special Requests   Final    AEROBIC BOTTLE ONLY Blood Culture results may not be optimal due to an inadequate volume of blood received in culture bottles   Culture   Final    NO GROWTH 1 DAY Performed at [facility], [address], [place], [place] [address]    Report Status PENDING  Incomplete    RADIOLOGY STUDIES/RESULTS: No results found.   LOS: 3 days   Jeoffrey Massed, MD  [facility]    To contact the attending provider between 7A-7P or the covering provider during after hours 7P-7A, please log into the web site [email or web address] and access using universal [place] password for that web site. If you do not have the password, please call the hospital operator.  05/16/2021, 12:32 PM

## 2021-05-17 MED ORDER — CLOTRIMAZOLE 1 % EX CREA
TOPICAL_CREAM | Freq: Two times a day (BID) | CUTANEOUS | Status: AC
  Filled 2021-05-17: qty 15

## 2021-05-17 MED ORDER — POLYETHYLENE GLYCOL 3350 17 G PO PACK
17.0000 g | PACK | Freq: Two times a day (BID) | ORAL | Status: DC
  Administered 2021-05-17 – 2021-05-23 (×9): 17 g via ORAL
  Filled 2021-05-17 (×18): qty 1

## 2021-05-17 NOTE — Progress Notes (Signed)
PROGRESS NOTE        PATIENT DETAILS Name: Duane Price Age: 35 y.o. Sex: male Date of Birth: 12/03/1985 Admit Date: 05/13/2021 Admitting Physician Clydie Braunondell A Smith, MD BJY:NWGNFAOPCP:Patient, No Pcp Per (Inactive)  Brief Narrative: Patient is a 35 y.o. 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  Acknowledges noncompliance to medications and ongoing IV heroin use.  Significant events: 7/1-8/1>> hospitalization for MRSA bacteremia with septic pulmonary emboli and tricuspid valve endocarditis-s/p angio vac debridement.  Discharged on oral linezolid 8/19>> admit for worsening back pain-found to have T12-L1 discitis.    Significant studies: 8/19>> MRI thoracolumbar spine: Early discitis/osteomyelitis T12-L1  Antimicrobial therapy: Vancomycin: 8/19>>  Microbiology data: 8/19>> blood culture : MRSA  8/21>> blood culture: No growth  Procedures : None  Consults: Infectious disease  DVT Prophylaxis : apixaban (ELIQUIS) tablet 5 mg   Subjective: Lying comfortably in bed-no major issues overnight.   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  Await further recommendations from ID.  Pain well controlled with combination of scheduled methadone and short acting oxycodone for breakthrough pain.  No longer on IV Dilaudid.  We will slowly attempt to titrate down oxycodone over the next few days.  COVID-19 infection: Clearly asymptomatic-maintain on isolation for 10 days.  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  Will need social work counseling when closer to discharge.  GERD: PPI  Homelessness  Diet: Diet Order             Diet regular Room service appropriate? Yes; Fluid consistency: Thin  Diet effective now                    Code Status: Full code   Family Communication: None at bedside  Disposition Plan: Status is: Inpatient  Remains inpatient appropriate because:Inpatient level of care appropriate due to severity of illness  Dispo: The patient is from: Home              Anticipated d/c is to: Home              Patient currently is not medically stable to d/c.   Difficult to place patient No   Barriers to Discharge: MRSA bacteremia-not a candidate for outpatient IV antimicrobial therapy.  Needs to remain inpatient until he clears the bacteremia.  Antimicrobial agents: Anti-infectives (From admission, onward)    Start     Dose/Rate Route Frequency Ordered Stop   05/16/21 2200  vancomycin (VANCOCIN) IVPB 1000 mg/200 mL premix        1,000 mg 200 mL/hr over 60 Minutes Intravenous Every 12 hours 05/16/21 1826     05/13/21 1600  vancomycin (VANCOCIN) IVPB 1000 mg/200 mL premix  Status:  Discontinued        1,000 mg 200 mL/hr over 60 Minutes Intravenous Every  8 hours 05/13/21 0818 05/16/21 1301   05/13/21 0715  vancomycin (VANCOREADY) IVPB 1250 mg/250 mL        1,250 mg 166.7 mL/hr over 90 Minutes Intravenous  Once 05/13/21 0655 05/13/21 0925        Time spent: 15 minutes-Greater than 50% of this time was spent in counseling, explanation of diagnosis, planning of further management, and coordination of care.  MEDICATIONS: Scheduled Meds:  apixaban  5 mg Oral BID   feeding supplement  237 mL Oral BID BM   lidocaine  1 patch Transdermal Q24H    melatonin  3 mg Oral QHS   methadone  25 mg Oral Q12H   nicotine  14 mg Transdermal Daily   pantoprazole  40 mg Oral Daily   polyethylene glycol  17 g Oral Daily   senna  2 tablet Oral QHS   sodium chloride flush  3 mL Intravenous Q12H   Continuous Infusions:  vancomycin 1,000 mg (05/17/21 0838)   PRN Meds:.acetaminophen **OR** acetaminophen, albuterol, bisacodyl, hydrOXYzine, naLOXone (NARCAN)  injection, ondansetron **OR** ondansetron (ZOFRAN) IV, oxyCODONE, sodium phosphate   PHYSICAL EXAM: Vital signs: Vitals:   05/16/21 2046 05/17/21 0039 05/17/21 0805 05/17/21 1157  BP: 112/62 105/68 (!) 105/57 (!) 97/57  Pulse: 75 77 74 66  Resp: 12 10 13 17   Temp: 99.3 F (37.4 C) 98.9 F (37.2 C) 98.2 F (36.8 C) 97.9 F (36.6 C)  TempSrc: Oral Axillary Oral Oral  SpO2: 96% 96% 97% 96%  Weight:       Filed Weights   05/13/21 0700 05/14/21 0400  Weight: 70.9 kg 70.4 kg   Body mass index is 21.05 kg/m.   Gen Exam:Alert awake-not in any distress HEENT:atraumatic, normocephalic Chest: B/L clear to auscultation anteriorly CVS:S1S2 regular Abdomen:soft non tender, non distended Extremities:no edema Neurology: Non focal Skin: no rash   I have personally reviewed following labs and imaging studies  LABORATORY DATA: CBC: Recent Labs  Lab 05/13/21 0308 05/14/21 0040 05/15/21 0607 05/16/21 0845  WBC 11.8* 11.0* 8.2 7.8  NEUTROABS 10.2*  --   --   --   HGB 8.9* 9.2* 10.1* 9.8*  HCT 28.1* 28.3* 30.7* 30.1*  MCV 90.6 86.5 87.2 86.2  PLT 268 299 130* 334     Basic Metabolic Panel: Recent Labs  Lab 05/13/21 0308 05/14/21 0040 05/15/21 0607 05/16/21 0845  NA 134* 131* 137 136  K 3.9 3.9 3.8 3.9  CL 98 97* 102 97*  CO2 29 26 27 28   GLUCOSE 152* 142* 109* 89  BUN <5* 13 5* 6  CREATININE 0.66 0.83 0.75 0.86  CALCIUM 8.8* 8.5* 9.1 8.7*     GFR: Estimated Creatinine Clearance: 121.7 mL/min (by C-G formula based on SCr of 0.86 mg/dL).  Liver Function  Tests: Recent Labs  Lab 05/14/21 0040  AST 12*  ALT 11  ALKPHOS 66  BILITOT 0.3  PROT 6.5  ALBUMIN 2.3*    No results for input(s): LIPASE, AMYLASE in the last 168 hours. No results for input(s): AMMONIA in the last 168 hours.  Coagulation Profile: No results for input(s): INR, PROTIME in the last 168 hours.  Cardiac Enzymes: No results for input(s): CKTOTAL, CKMB, CKMBINDEX, TROPONINI in the last 168 hours.  BNP (last 3 results) No results for input(s): PROBNP in the last 8760 hours.  Lipid Profile: No results for input(s): CHOL, HDL, LDLCALC, TRIG, CHOLHDL, LDLDIRECT in the last 72 hours.  Thyroid Function Tests: No results for input(s): TSH, T4TOTAL, FREET4, T3FREE,  THYROIDAB in the last 72 hours.  Anemia Panel: No results for input(s): VITAMINB12, FOLATE, FERRITIN, TIBC, IRON, RETICCTPCT in the last 72 hours.  Urine analysis:    Component Value Date/Time   COLORURINE YELLOW 04/19/2021 1546   APPEARANCEUR HAZY (A) 04/19/2021 1546   LABSPEC 1.021 04/19/2021 1546   PHURINE 5.0 04/19/2021 1546   GLUCOSEU NEGATIVE 04/19/2021 1546   HGBUR LARGE (A) 04/19/2021 1546   BILIRUBINUR NEGATIVE 04/19/2021 1546   KETONESUR NEGATIVE 04/19/2021 1546   PROTEINUR 30 (A) 04/19/2021 1546   UROBILINOGEN 1.0 02/09/2010 2121   NITRITE NEGATIVE 04/19/2021 1546   LEUKOCYTESUR NEGATIVE 04/19/2021 1546    Sepsis Labs: Lactic Acid, Venous    Component Value Date/Time   LATICACIDVEN 0.8 05/13/2021 0310    MICROBIOLOGY: Recent Results (from the past 240 hour(s))  Blood culture (routine x 2)     Status: Abnormal   Collection Time: 05/13/21  3:08 AM   Specimen: BLOOD RIGHT HAND  Result Value Ref Range Status   Specimen Description BLOOD RIGHT HAND  Final   Special Requests   Final    BOTTLES DRAWN AEROBIC AND ANAEROBIC Blood Culture adequate volume   Culture  Setup Time   Final    GRAM POSITIVE COCCI IN CLUSTERS IN BOTH AEROBIC AND ANAEROBIC BOTTLES CRITICAL RESULT CALLED TO,  READ BACK BY AND VERIFIED WITHArnaldo Natal [facility] 2116 05/13/21 A BROWNING Performed at [facility], [address] [place], [place] [address]    Culture METHICILLIN RESISTANT STAPHYLOCOCCUS AUREUS (A)  Final   Report Status 05/15/2021 FINAL  Final   Organism ID, Bacteria METHICILLIN RESISTANT STAPHYLOCOCCUS AUREUS  Final      Susceptibility   Methicillin resistant staphylococcus aureus - MIC*    CIPROFLOXACIN >=8 RESISTANT Resistant     ERYTHROMYCIN >=8 RESISTANT Resistant     GENTAMICIN <=0.5 SENSITIVE Sensitive     OXACILLIN >=4 RESISTANT Resistant     TETRACYCLINE <=1 SENSITIVE Sensitive     VANCOMYCIN <=0.5 SENSITIVE Sensitive     TRIMETH/SULFA <=10 SENSITIVE Sensitive     CLINDAMYCIN <=0.25 SENSITIVE Sensitive     RIFAMPIN <=0.5 SENSITIVE Sensitive     Inducible Clindamycin NEGATIVE Sensitive     * METHICILLIN RESISTANT STAPHYLOCOCCUS AUREUS  Blood Culture ID Panel (Reflexed)     Status: Abnormal   Collection Time: 05/13/21  3:08 AM  Result Value Ref Range Status   Enterococcus faecalis NOT DETECTED NOT DETECTED Final   Enterococcus Faecium NOT DETECTED NOT DETECTED Final   Listeria monocytogenes NOT DETECTED NOT DETECTED Final   Staphylococcus species DETECTED (A) NOT DETECTED Final    Comment: CRITICAL RESULT CALLED TO, READ BACK BY AND VERIFIED WITH: Arnaldo Natal [facility] 2116 05/13/21 A BROWNING    Staphylococcus aureus (BCID) DETECTED (A) NOT DETECTED Final    Comment: Methicillin (oxacillin)-resistant Staphylococcus aureus (MRSA). MRSA is predictably resistant to beta-lactam antibiotics (except ceftaroline). Preferred therapy is vancomycin unless clinically contraindicated. Patient requires contact precautions if  hospitalized. CRITICAL RESULT CALLED TO, READ BACK BY AND VERIFIED WITH: Arnaldo Natal PHARMD 2116 05/13/21 A BROWNING    Staphylococcus epidermidis NOT DETECTED NOT DETECTED Final   Staphylococcus lugdunensis NOT DETECTED NOT DETECTED Final   Streptococcus species  NOT DETECTED NOT DETECTED Final   Streptococcus agalactiae NOT DETECTED NOT DETECTED Final   Streptococcus pneumoniae NOT DETECTED NOT DETECTED Final   Streptococcus pyogenes NOT DETECTED NOT DETECTED Final   A.calcoaceticus-baumannii NOT DETECTED NOT DETECTED Final   Bacteroides fragilis NOT DETECTED NOT DETECTED  Final   Enterobacterales NOT DETECTED NOT DETECTED Final   Enterobacter cloacae complex NOT DETECTED NOT DETECTED Final   Escherichia coli NOT DETECTED NOT DETECTED Final   Klebsiella aerogenes NOT DETECTED NOT DETECTED Final   Klebsiella oxytoca NOT DETECTED NOT DETECTED Final   Klebsiella pneumoniae NOT DETECTED NOT DETECTED Final   Proteus species NOT DETECTED NOT DETECTED Final   Salmonella species NOT DETECTED NOT DETECTED Final   Serratia marcescens NOT DETECTED NOT DETECTED Final   Haemophilus influenzae NOT DETECTED NOT DETECTED Final   Neisseria meningitidis NOT DETECTED NOT DETECTED Final   Pseudomonas aeruginosa NOT DETECTED NOT DETECTED Final   Stenotrophomonas maltophilia NOT DETECTED NOT DETECTED Final   Candida albicans NOT DETECTED NOT DETECTED Final   Candida auris NOT DETECTED NOT DETECTED Final   Candida glabrata NOT DETECTED NOT DETECTED Final   Candida krusei NOT DETECTED NOT DETECTED Final   Candida parapsilosis NOT DETECTED NOT DETECTED Final   Candida tropicalis NOT DETECTED NOT DETECTED Final   Cryptococcus neoformans/gattii NOT DETECTED NOT DETECTED Final   Meth resistant mecA/C and MREJ DETECTED (A) NOT DETECTED Final    Comment: CRITICAL RESULT CALLED TO, READ BACK BY AND VERIFIED WITHArnaldo Natal [facility] 2116 05/13/21 A BROWNING Performed at [facility], [address], [place], [place] [address]   Blood culture (routine x 2)     Status: Abnormal   Collection Time: 05/13/21  3:09 AM   Specimen: BLOOD LEFT HAND  Result Value Ref Range Status   Specimen Description BLOOD LEFT HAND  Final   Special Requests   Final    BOTTLES DRAWN AEROBIC  AND ANAEROBIC Blood Culture adequate volume   Culture  Setup Time   Final    GRAM POSITIVE COCCI IN CLUSTERS AEROBIC BOTTLE ONLY CRITICAL VALUE NOTED.  VALUE IS CONSISTENT WITH PREVIOUSLY REPORTED AND CALLED VALUE.    Culture (A)  Final    STAPHYLOCOCCUS AUREUS SUSCEPTIBILITIES PERFORMED ON PREVIOUS CULTURE WITHIN THE LAST 5 DAYS. Performed at [facility], [address] [place], [place] [address]    Report Status 05/16/2021 FINAL  Final  Resp Panel by RT-PCR (Flu A&B, Covid) Nasopharyngeal Swab     Status: Abnormal   Collection Time: 05/13/21  8:07 AM   Specimen: Nasopharyngeal Swab; Nasopharyngeal(NP) swabs in vial transport medium  Result Value Ref Range Status   SARS Coronavirus 2 by RT PCR POSITIVE (A) NEGATIVE Final    Comment: RESULT CALLED TO, READ BACK BY AND VERIFIED WITH: RN Cherlyn Labella [id number] AT 1016 BY CM (NOTE) SARS-CoV-2 target nucleic acids are DETECTED.  The SARS-CoV-2 RNA is generally detectable in upper respiratory specimens during the acute phase of infection. Positive results are indicative of the presence of the identified virus, but do not rule out bacterial infection or co-infection with other pathogens not detected by the test. Clinical correlation with patient history and other diagnostic information is necessary to determine patient infection status. The expected result is Negative.  Fact Sheet for Patients: BloggerCourse.com  Fact Sheet for Healthcare Providers: SeriousBroker.it  This test is not yet approved or cleared by the [place] FDA and  has been authorized for detection and/or diagnosis of SARS-CoV-2 by FDA under an Emergency Use Authorization (EUA).  This EUA will remain in effect (meaning this test can b e used) for the duration of  the COVID-19 declaration under Section 564(b)(1) of the Act, 21 U.S.C. section 360bbb-3(b)(1), unless the authorization is terminated or revoked  sooner.  Influenza A by PCR NEGATIVE NEGATIVE Final   Influenza B by PCR NEGATIVE NEGATIVE Final    Comment: (NOTE) The Xpert Xpress SARS-CoV-2/FLU/RSV plus assay is intended as an aid in the diagnosis of influenza from Nasopharyngeal swab specimens and should not be used as a sole basis for treatment. Nasal washings and aspirates are unacceptable for Xpert Xpress SARS-CoV-2/FLU/RSV testing.  Fact Sheet for Patients: BloggerCourse.com  Fact Sheet for Healthcare Providers: SeriousBroker.it  This test is not yet approved or cleared by the [place] FDA and has been authorized for detection and/or diagnosis of SARS-CoV-2 by FDA under an Emergency Use Authorization (EUA). This EUA will remain in effect (meaning this test can be used) for the duration of the COVID-19 declaration under Section 564(b)(1) of the Act, 21 U.S.C. section 360bbb-3(b)(1), unless the authorization is terminated or revoked.  Performed at [facility], [address], [place], [place] [address]   Culture, blood (routine x 2)     Status: None (Preliminary result)   Collection Time: 05/15/21  6:07 AM   Specimen: BLOOD  Result Value Ref Range Status   Specimen Description BLOOD BLOOD LEFT HAND  Final   Special Requests   Final    AEROBIC BOTTLE ONLY Blood Culture results may not be optimal due to an inadequate volume of blood received in culture bottles   Culture   Final    NO GROWTH 2 DAYS Performed at [facility], [address], [place], [place] [address]    Report Status PENDING  Incomplete  Culture, blood (routine x 2)     Status: None (Preliminary result)   Collection Time: 05/15/21  6:07 AM   Specimen: BLOOD  Result Value Ref Range Status   Specimen Description BLOOD BLOOD RIGHT HAND  Final   Special Requests   Final    AEROBIC BOTTLE ONLY Blood Culture results may not be optimal due to an inadequate volume of blood received in  culture bottles   Culture   Final    NO GROWTH 2 DAYS Performed at [facility], [address] [place], [place] [address]    Report Status PENDING  Incomplete    RADIOLOGY STUDIES/RESULTS: No results found.   LOS: 4 days   Jeoffrey Massed, MD  [facility]    To contact the attending provider between 7A-7P or the covering provider during after hours 7P-7A, please log into the web site [email or web address] and access using universal [place] password for that web site. If you do not have the password, please call the hospital operator.  05/17/2021, 2:19 PM

## 2021-05-18 LAB — CBC
HCT: 30.6 % — ABNORMAL LOW (ref 39.0–52.0)
Hemoglobin: 10 g/dL — ABNORMAL LOW (ref 13.0–17.0)
MCH: 28.2 pg (ref 26.0–34.0)
MCHC: 32.7 g/dL (ref 30.0–36.0)
MCV: 86.2 fL (ref 80.0–100.0)
Platelets: 394 10*3/uL (ref 150–400)
RBC: 3.55 MIL/uL — ABNORMAL LOW (ref 4.22–5.81)
RDW: 14.6 % (ref 11.5–15.5)
WBC: 9.2 10*3/uL (ref 4.0–10.5)
nRBC: 0 % (ref 0.0–0.2)

## 2021-05-18 LAB — BASIC METABOLIC PANEL
Anion gap: 9 (ref 5–15)
BUN: 19 mg/dL (ref 6–20)
CO2: 28 mmol/L (ref 22–32)
Calcium: 9.6 mg/dL (ref 8.9–10.3)
Chloride: 95 mmol/L — ABNORMAL LOW (ref 98–111)
Creatinine, Ser: 0.82 mg/dL (ref 0.61–1.24)
GFR, Estimated: >60 mL/min (ref >60)
Glucose, Bld: 95 mg/dL (ref 70–99)
Potassium: 4.4 mmol/L (ref 3.5–5.1)
Sodium: 132 mmol/L — ABNORMAL LOW (ref 135–145)

## 2021-05-18 NOTE — Progress Notes (Signed)
PROGRESS NOTE        PATIENT DETAILS Name: Duane Price Age: 35 y.o. Sex: male Date of Birth: 1986-04-27 Admit Date: 05/13/2021 Admitting Physician Clydie Braun, MD [id number], No Pcp Per (Inactive)  Brief Narrative: Patient is a 35 y.o. 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  Acknowledges noncompliance to medications and ongoing IV heroin use.  Significant events: 7/1-8/1>> hospitalization for MRSA bacteremia with septic pulmonary emboli and tricuspid valve endocarditis-s/p angio vac debridement.  Discharged on oral linezolid 8/19>> admit for worsening back pain-found to have T12-L1 discitis.    Significant studies: 8/19>> MRI thoracolumbar spine: Early discitis/osteomyelitis T12-L1  Antimicrobial therapy: Vancomycin: 8/19>>  Microbiology data: 8/19>> blood culture : MRSA  8/21>> blood culture: No growth  Procedures : None  Consults: Infectious disease  DVT Prophylaxis : apixaban (ELIQUIS) tablet 5 mg   Subjective: Back pain well controlled with current regimen-he does not want the narcotic regimen adjusted-but I clearly told him that once we get closer to discharge-we will need to adjust his narcotic regimen and that he will need to reestablish himself with the methadone clinic.   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  Await further recommendations from ID.  Pain well controlled with combination of scheduled methadone and short acting oxycodone for breakthrough pain.  No longer on IV Dilaudid.  We  will slowly attempt to titrate down oxycodone over the next few days.  COVID-19 infection: Clearly asymptomatic-maintain on isolation for 10 days.  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  Will need social work counseling when closer to discharge.  GERD: PPI  Homelessness  Diet: Diet Order             Diet regular Room service appropriate? Yes; Fluid consistency: Thin  Diet effective now                    Code Status: Full code   Family Communication: None at bedside  Disposition Plan: Status is: Inpatient  Remains inpatient appropriate because:Inpatient level of care appropriate due to severity of illness  Dispo: The patient is from: Home              Anticipated d/c is to: Home              Patient currently is not medically stable to d/c.   Difficult to place patient No   Barriers to Discharge: MRSA bacteremia-not a candidate for outpatient IV antimicrobial therapy.  Needs to remain inpatient until he clears the bacteremia.  Antimicrobial agents: Anti-infectives (From admission, onward)    Start     Dose/Rate Route Frequency Ordered Stop   05/16/21 2200  vancomycin (VANCOCIN) IVPB 1000 mg/200 mL premix        1,000 mg 200 mL/hr over 60 Minutes Intravenous Every 12  hours 05/16/21 1826     05/13/21 1600  vancomycin (VANCOCIN) IVPB 1000 mg/200 mL premix  Status:  Discontinued        1,000 mg 200 mL/hr over 60 Minutes Intravenous Every 8 hours 05/13/21 0818 05/16/21 1301   05/13/21 0715  vancomycin (VANCOREADY) IVPB 1250 mg/250 mL        1,250 mg 166.7 mL/hr over 90 Minutes Intravenous  Once 05/13/21 0655 05/13/21 0925        Time spent: 15 minutes-Greater than 50% of this time was spent in counseling,  explanation of diagnosis, planning of further management, and coordination of care.  MEDICATIONS: Scheduled Meds:  apixaban  5 mg Oral BID   clotrimazole   Topical BID   feeding supplement  237 mL Oral BID BM   lidocaine  1 patch Transdermal Q24H   melatonin  3 mg Oral QHS   methadone  25 mg Oral Q12H   nicotine  14 mg Transdermal Daily   pantoprazole  40 mg Oral Daily   polyethylene glycol  17 g Oral BID   senna  2 tablet Oral QHS   sodium chloride flush  3 mL Intravenous Q12H   Continuous Infusions:  vancomycin 1,000 mg (05/18/21 0916)   PRN Meds:.acetaminophen **OR** acetaminophen, albuterol, bisacodyl, hydrOXYzine, naLOXone (NARCAN)  injection, ondansetron **OR** ondansetron (ZOFRAN) IV, oxyCODONE, sodium phosphate   PHYSICAL EXAM: Vital signs: Vitals:   05/17/21 1157 05/18/21 0508 05/18/21 0808 05/18/21 1157  BP: (!) 97/57 (!) 97/55 101/61 (!) 101/56  Pulse: 66 74 73   Resp: 17 18 16 12   Temp: 97.9 F (36.6 C) 98 F (36.7 C) 98.5 F (36.9 C) 98.2 F (36.8 C)  TempSrc: Oral Oral Oral Oral  SpO2: 96% 97% 98%   Weight:       Filed Weights   05/13/21 0700 05/14/21 0400  Weight: 70.9 kg 70.4 kg   Body mass index is 21.05 kg/m.   Gen Exam:Alert awake-not in any distress HEENT:atraumatic, normocephalic Chest: B/L clear to auscultation anteriorly CVS:S1S2 regular Abdomen:soft non tender, non distended Extremities:no edema Neurology: Non focal Skin: no rash   I have personally reviewed following labs and imaging studies  LABORATORY DATA: CBC: Recent Labs  Lab 05/13/21 0308 05/14/21 0040 05/15/21 0607 05/16/21 0845 05/18/21 0556  WBC 11.8* 11.0* 8.2 7.8 9.2  NEUTROABS 10.2*  --   --   --   --   HGB 8.9* 9.2* 10.1* 9.8* 10.0*  HCT 28.1* 28.3* 30.7* 30.1* 30.6*  MCV 90.6 86.5 87.2 86.2 86.2  PLT 268 299 130* 334 394     Basic Metabolic Panel: Recent Labs  Lab 05/13/21 0308 05/14/21 0040 05/15/21 0607 05/16/21 0845 05/18/21 0556  NA 134* 131*  137 136 132*  K 3.9 3.9 3.8 3.9 4.4  CL 98 97* 102 97* 95*  CO2 29 26 27 28 28   GLUCOSE 152* 142* 109* 89 95  BUN <5* 13 5* 6 19  CREATININE 0.66 0.83 0.75 0.86 0.82  CALCIUM 8.8* 8.5* 9.1 8.7* 9.6     GFR: Estimated Creatinine Clearance: 127.6 mL/min (by C-G formula based on SCr of 0.82 mg/dL).  Liver Function Tests: Recent Labs  Lab 05/14/21 0040  AST 12*  ALT 11  ALKPHOS 66  BILITOT 0.3  PROT 6.5  ALBUMIN 2.3*    No results for input(s): LIPASE, AMYLASE in the last 168 hours. No results for input(s): AMMONIA in the last 168 hours.  Coagulation Profile: No results for input(s): INR, PROTIME in  the last 168 hours.  Cardiac Enzymes: No results for input(s): CKTOTAL, CKMB, CKMBINDEX, TROPONINI in the last 168 hours.  BNP (last 3 results) No results for input(s): PROBNP in the last 8760 hours.  Lipid Profile: No results for input(s): CHOL, HDL, LDLCALC, TRIG, CHOLHDL, LDLDIRECT in the last 72 hours.  Thyroid Function Tests: No results for input(s): TSH, T4TOTAL, FREET4, T3FREE, THYROIDAB in the last 72 hours.  Anemia Panel: No results for input(s): VITAMINB12, FOLATE, FERRITIN, TIBC, IRON, RETICCTPCT in the last 72 hours.  Urine analysis:    Component Value Date/Time   COLORURINE YELLOW 04/19/2021 1546   APPEARANCEUR HAZY (A) 04/19/2021 1546   LABSPEC 1.021 04/19/2021 1546   PHURINE 5.0 04/19/2021 1546   GLUCOSEU NEGATIVE 04/19/2021 1546   HGBUR LARGE (A) 04/19/2021 1546   BILIRUBINUR NEGATIVE 04/19/2021 1546   KETONESUR NEGATIVE 04/19/2021 1546   PROTEINUR 30 (A) 04/19/2021 1546   UROBILINOGEN 1.0 02/09/2010 2121   NITRITE NEGATIVE 04/19/2021 1546   LEUKOCYTESUR NEGATIVE 04/19/2021 1546    Sepsis Labs: Lactic Acid, Venous    Component Value Date/Time   LATICACIDVEN 0.8 05/13/2021 0310    MICROBIOLOGY: Recent Results (from the past 240 hour(s))  Blood culture (routine x 2)     Status: Abnormal   Collection Time: 05/13/21  3:08 AM   Specimen:  BLOOD RIGHT HAND  Result Value Ref Range Status   Specimen Description BLOOD RIGHT HAND  Final   Special Requests   Final    BOTTLES DRAWN AEROBIC AND ANAEROBIC Blood Culture adequate volume   Culture  Setup Time   Final    GRAM POSITIVE COCCI IN CLUSTERS IN BOTH AEROBIC AND ANAEROBIC BOTTLES CRITICAL RESULT CALLED TO, READ BACK BY AND VERIFIED WITHArnaldo Natal: J CARNEY [facility] 2116 05/13/21 A BROWNING Performed at [facility], [address] [place], [place] [address]    Culture METHICILLIN RESISTANT STAPHYLOCOCCUS AUREUS (A)  Final   Report Status 05/15/2021 FINAL  Final   Organism ID, Bacteria METHICILLIN RESISTANT STAPHYLOCOCCUS AUREUS  Final      Susceptibility   Methicillin resistant staphylococcus aureus - MIC*    CIPROFLOXACIN >=8 RESISTANT Resistant     ERYTHROMYCIN >=8 RESISTANT Resistant     GENTAMICIN <=0.5 SENSITIVE Sensitive     OXACILLIN >=4 RESISTANT Resistant     TETRACYCLINE <=1 SENSITIVE Sensitive     VANCOMYCIN <=0.5 SENSITIVE Sensitive     TRIMETH/SULFA <=10 SENSITIVE Sensitive     CLINDAMYCIN <=0.25 SENSITIVE Sensitive     RIFAMPIN <=0.5 SENSITIVE Sensitive     Inducible Clindamycin NEGATIVE Sensitive     * METHICILLIN RESISTANT STAPHYLOCOCCUS AUREUS  Blood Culture ID Panel (Reflexed)     Status: Abnormal   Collection Time: 05/13/21  3:08 AM  Result Value Ref Range Status   Enterococcus faecalis NOT DETECTED NOT DETECTED Final   Enterococcus Faecium NOT DETECTED NOT DETECTED Final   Listeria monocytogenes NOT DETECTED NOT DETECTED Final   Staphylococcus species DETECTED (A) NOT DETECTED Final    Comment: CRITICAL RESULT CALLED TO, READ BACK BY AND VERIFIED WITH: Arnaldo NatalJ CARNEY PHARMD 2116 05/13/21 A BROWNING    Staphylococcus aureus (BCID) DETECTED (A) NOT DETECTED Final    Comment: Methicillin (oxacillin)-resistant Staphylococcus aureus (MRSA). MRSA is predictably resistant to beta-lactam antibiotics (except ceftaroline). Preferred therapy is vancomycin unless  clinically contraindicated. Patient requires contact precautions if  hospitalized. CRITICAL RESULT CALLED TO, READ BACK BY AND VERIFIED WITH: Arnaldo NatalJ CARNEY [facility] 2116 05/13/21 A BROWNING    Staphylococcus epidermidis NOT  DETECTED NOT DETECTED Final   Staphylococcus lugdunensis NOT DETECTED NOT DETECTED Final   Streptococcus species NOT DETECTED NOT DETECTED Final   Streptococcus agalactiae NOT DETECTED NOT DETECTED Final   Streptococcus pneumoniae NOT DETECTED NOT DETECTED Final   Streptococcus pyogenes NOT DETECTED NOT DETECTED Final   A.calcoaceticus-baumannii NOT DETECTED NOT DETECTED Final   Bacteroides fragilis NOT DETECTED NOT DETECTED Final   Enterobacterales NOT DETECTED NOT DETECTED Final   Enterobacter cloacae complex NOT DETECTED NOT DETECTED Final   Escherichia coli NOT DETECTED NOT DETECTED Final   Klebsiella aerogenes NOT DETECTED NOT DETECTED Final   Klebsiella oxytoca NOT DETECTED NOT DETECTED Final   Klebsiella pneumoniae NOT DETECTED NOT DETECTED Final   Proteus species NOT DETECTED NOT DETECTED Final   Salmonella species NOT DETECTED NOT DETECTED Final   Serratia marcescens NOT DETECTED NOT DETECTED Final   Haemophilus influenzae NOT DETECTED NOT DETECTED Final   Neisseria meningitidis NOT DETECTED NOT DETECTED Final   Pseudomonas aeruginosa NOT DETECTED NOT DETECTED Final   Stenotrophomonas maltophilia NOT DETECTED NOT DETECTED Final   Candida albicans NOT DETECTED NOT DETECTED Final   Candida auris NOT DETECTED NOT DETECTED Final   Candida glabrata NOT DETECTED NOT DETECTED Final   Candida krusei NOT DETECTED NOT DETECTED Final   Candida parapsilosis NOT DETECTED NOT DETECTED Final   Candida tropicalis NOT DETECTED NOT DETECTED Final   Cryptococcus neoformans/gattii NOT DETECTED NOT DETECTED Final   Meth resistant mecA/C and MREJ DETECTED (A) NOT DETECTED Final    Comment: CRITICAL RESULT CALLED TO, READ BACK BY AND VERIFIED WITHArnaldo Natal [facility] 2116 05/13/21 A  BROWNING Performed at [facility], [address] [place], [place] [address]   Blood culture (routine x 2)     Status: Abnormal   Collection Time: 05/13/21  3:09 AM   Specimen: BLOOD LEFT HAND  Result Value Ref Range Status   Specimen Description BLOOD LEFT HAND  Final   Special Requests   Final    BOTTLES DRAWN AEROBIC AND ANAEROBIC Blood Culture adequate volume   Culture  Setup Time   Final    GRAM POSITIVE COCCI IN CLUSTERS AEROBIC BOTTLE ONLY CRITICAL VALUE NOTED.  VALUE IS CONSISTENT WITH PREVIOUSLY REPORTED AND CALLED VALUE.    Culture (A)  Final    STAPHYLOCOCCUS AUREUS SUSCEPTIBILITIES PERFORMED ON PREVIOUS CULTURE WITHIN THE LAST 5 DAYS. Performed at [facility], [address] [place], [place] [address]    Report Status 05/16/2021 FINAL  Final  Resp Panel by RT-PCR (Flu A&B, Covid) Nasopharyngeal Swab     Status: Abnormal   Collection Time: 05/13/21  8:07 AM   Specimen: Nasopharyngeal Swab; Nasopharyngeal(NP) swabs in vial transport medium  Result Value Ref Range Status   SARS Coronavirus 2 by RT PCR POSITIVE (A) NEGATIVE Final    Comment: RESULT CALLED TO, READ BACK BY AND VERIFIED WITH: RN Cherlyn Labella [id number] AT 1016 BY CM (NOTE) SARS-CoV-2 target nucleic acids are DETECTED.  The SARS-CoV-2 RNA is generally detectable in upper respiratory specimens during the acute phase of infection. Positive results are indicative of the presence of the identified virus, but do not rule out bacterial infection or co-infection with other pathogens not detected by the test. Clinical correlation with patient history and other diagnostic information is necessary to determine patient infection status. The expected result is Negative.  Fact Sheet for Patients: BloggerCourse.com  Fact Sheet for Healthcare Providers: SeriousBroker.it  This test is not yet approved or cleared  by the [place] and  has been  authorized for detection and/or diagnosis of SARS-CoV-2 by FDA under an Emergency Use Authorization (EUA).  This EUA will remain in effect (meaning this test can b e used) for the duration of  the COVID-19 declaration under Section 564(b)(1) of the Act, 21 U.S.C. section 360bbb-3(b)(1), unless the authorization is terminated or revoked sooner.     Influenza A by PCR NEGATIVE NEGATIVE Final   Influenza B by PCR NEGATIVE NEGATIVE Final    Comment: (NOTE) The Xpert Xpress SARS-CoV-2/FLU/RSV plus assay is intended as an aid in the diagnosis of influenza from Nasopharyngeal swab specimens and should not be used as a sole basis for treatment. Nasal washings and aspirates are unacceptable for Xpert Xpress SARS-CoV-2/FLU/RSV testing.  Fact Sheet for Patients: BloggerCourse.com  Fact Sheet for Healthcare Providers: SeriousBroker.it  This test is not yet approved or cleared by the [place] FDA and has been authorized for detection and/or diagnosis of SARS-CoV-2 by FDA under an Emergency Use Authorization (EUA). This EUA will remain in effect (meaning this test can be used) for the duration of the COVID-19 declaration under Section 564(b)(1) of the Act, 21 U.S.C. section 360bbb-3(b)(1), unless the authorization is terminated or revoked.  Performed at [facility], [address] [place], [place] [address]   Culture, blood (routine x 2)     Status: None (Preliminary result)   Collection Time: 05/15/21  6:07 AM   Specimen: BLOOD  Result Value Ref Range Status   Specimen Description BLOOD BLOOD LEFT HAND  Final   Special Requests   Final    AEROBIC BOTTLE ONLY Blood Culture results may not be optimal due to an inadequate volume of blood received in culture bottles   Culture   Final    NO GROWTH 3 DAYS Performed at [facility], [address], [place], [place] [address]    Report Status PENDING  Incomplete  Culture,  blood (routine x 2)     Status: None (Preliminary result)   Collection Time: 05/15/21  6:07 AM   Specimen: BLOOD  Result Value Ref Range Status   Specimen Description BLOOD BLOOD RIGHT HAND  Final   Special Requests   Final    AEROBIC BOTTLE ONLY Blood Culture results may not be optimal due to an inadequate volume of blood received in culture bottles   Culture   Final    NO GROWTH 3 DAYS Performed at [facility], [address], [place], [place] [address]    Report Status PENDING  Incomplete    RADIOLOGY STUDIES/RESULTS: No results found.   LOS: 5 days   Jeoffrey Massed, MD  [facility]    To contact the attending provider between 7A-7P or the covering provider during after hours 7P-7A, please log into the web site [email or web address] and access using universal [place] password for that web site. If you do not have the password, please call the hospital operator.  05/18/2021, 2:56 PM

## 2021-05-19 ENCOUNTER — Inpatient Hospital Stay: Payer: Self-pay

## 2021-05-19 NOTE — Plan of Care (Signed)

## 2021-05-19 NOTE — Progress Notes (Addendum)
Pharmacy Antibiotic Note  Duane Price is a 35 y.o. male admitted on 05/13/2021 with Lumbar discitis T12-L1.  Pharmacy has been consulted for Vanco dosing.  ID: Lumbar discitis T12-L1 on MRI, hx IVDU with MRSA bacteremia + veg - ID plan was for linezolid after discharge on 8/1; WBC 9.2, afebrile - ID recs at least 2 weeks IV abx before possible PO transition, likely to keep here given noncompliance   - ID note 8/22 mentions 16 days  Vanc 8/19 >> (16d, 9/4)  8/22 VP/VT 39/19, AUC 719 on 1g q8 >> reduce to 1g q12  8/19 BCx: MRSA  8/21 BCx: NGTD  Plan: Vanc to 1gm IV Q12H. Previously therapeutic on this dose, prior admission. Peak trough tonight/tomorrow.     Weight: 70.4 kg (155 lb 3.3 oz)  Temp (24hrs), Avg:98.3 F (36.8 C), Min:98 F (36.7 C), Max:98.5 F (36.9 C)  Recent Labs  Lab 05/13/21 0308 05/13/21 0310 05/14/21 0040 05/15/21 0607 05/16/21 0845 05/16/21 1112 05/16/21 1631 05/18/21 0556  WBC 11.8*  --  11.0* 8.2 7.8  --   --  9.2  CREATININE 0.66  --  0.83 0.75 0.86  --   --  0.82  LATICACIDVEN  --  0.8  --   --   --   --   --   --   VANCOTROUGH  --   --   --   --   --   --  19  --   VANCOPEAK  --   --   --   --   --  39  --   --     Estimated Creatinine Clearance: 127.6 mL/min (by C-G formula based on SCr of 0.82 mg/dL).    No Known Allergies  Duane Price S. Merilynn Finland, PharmD, BCPS Clinical Staff Pharmacist [email or web address]  Pasty Spillers 05/19/2021 8:50 AM

## 2021-05-19 NOTE — Progress Notes (Signed)
PROGRESS NOTE        PATIENT DETAILS Name: Duane Price Age: 35 y.o. Sex: male Date of Birth: November 21, 1985 Admit Date: 05/13/2021 Admitting Physician Clydie Braun, MD ZOX:[id number], No Pcp Per (Inactive)  Brief Narrative: Patient is a 35 y.o. 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  Acknowledges noncompliance to medications (claims Zyvox got lost) and ongoing IV heroin use.  Significant events: 7/1-8/1>> hospitalization for MRSA bacteremia with septic pulmonary emboli and tricuspid valve endocarditis-s/p angio vac debridement.  Discharged on oral linezolid 8/19>> admit for worsening back pain-found to have T12-L1 discitis.    Significant studies: 8/19>> MRI thoracolumbar spine: Early discitis/osteomyelitis T12-L1  Antimicrobial therapy: Vancomycin: 8/19>>  Microbiology data: 8/19>> blood culture : MRSA  8/21>> blood culture: No growth  Procedures : None  Consults: Infectious disease  DVT Prophylaxis : apixaban (ELIQUIS) tablet 5 mg   Subjective: Lying comfortably in bed-denies any chest pain or shortness of breath.  Back pain is controlled.   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  Repeat echo today-ID following.  Back pain well controlled with methadone and short acting oxycodone for breakthrough pain.  Patient aware that when he gets closer to discharge-he will need to call methadone clinic.    COVID-19 infection: Clearly asymptomatic-maintain on isolation for 10 days.  Do not think he has an active  infection-do not think he requires any COVID-19 specific treatment at this point  History of PE: On Eliquis  Normocytic anemia: No evidence of blood loss-likely due to acute illness/smoldering infection.  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  Will need social work counseling when closer to discharge.  GERD: PPI  Homelessness  Diet: Diet Order             Diet regular Room service appropriate? Yes; Fluid consistency: Thin  Diet effective now                    Code Status: Full code   Family Communication: None at bedside  Disposition Plan: Status is: Inpatient  Remains inpatient appropriate because:Inpatient level of care appropriate due to severity of illness  Dispo: The patient is from: Home              Anticipated d/c is to: Home              Patient currently is not medically stable to d/c.   Difficult to place patient No   Barriers to Discharge: MRSA bacteremia-not a candidate for outpatient IV antimicrobial therapy.  Needs to remain inpatient until he clears the bacteremia.  Antimicrobial agents: Anti-infectives (From admission, onward)    Start     Dose/Rate Route Frequency Ordered Stop   05/16/21 2200  vancomycin (VANCOCIN) IVPB 1000 mg/200 mL premix        1,000 mg 200 mL/hr over 60 Minutes Intravenous Every 12 hours 05/16/21 1826     05/13/21 1600  vancomycin (VANCOCIN) IVPB 1000 mg/200 mL premix  Status:  Discontinued        1,000  mg 200 mL/hr over 60 Minutes Intravenous Every 8 hours 05/13/21 0818 05/16/21 1301   05/13/21 0715  vancomycin (VANCOREADY) IVPB 1250 mg/250 mL        1,250 mg 166.7 mL/hr over 90 Minutes Intravenous  Once 05/13/21 0655 05/13/21 0925        Time spent: 15 minutes-Greater than 50% of this time was spent in counseling, explanation of diagnosis, planning of further management, and coordination of care.  MEDICATIONS: Scheduled Meds:  apixaban  5 mg Oral BID   feeding supplement  237 mL Oral BID BM    lidocaine  1 patch Transdermal Q24H   melatonin  3 mg Oral QHS   methadone  25 mg Oral Q12H   nicotine  14 mg Transdermal Daily   pantoprazole  40 mg Oral Daily   polyethylene glycol  17 g Oral BID   senna  2 tablet Oral QHS   sodium chloride flush  3 mL Intravenous Q12H   Continuous Infusions:  vancomycin 1,000 mg (05/19/21 0904)   PRN Meds:.acetaminophen **OR** acetaminophen, albuterol, bisacodyl, hydrOXYzine, naLOXone (NARCAN)  injection, ondansetron **OR** ondansetron (ZOFRAN) IV, oxyCODONE, sodium phosphate   PHYSICAL EXAM: Vital signs: Vitals:   05/19/21 0033 05/19/21 0433 05/19/21 0742 05/19/21 0746  BP: (!) 103/56 (!) 97/54    Pulse: 80 71 76   Resp: 16 16    Temp: 98 F (36.7 C) 98.4 F (36.9 C) 98.4 F (36.9 C) 98.4 F (36.9 C)  TempSrc: Oral Oral Oral Oral  SpO2: 98% 95% 98%   Weight:       Filed Weights   05/13/21 0700 05/14/21 0400  Weight: 70.9 kg 70.4 kg   Body mass index is 21.05 kg/m.   Gen Exam:Alert awake-not in any distress HEENT:atraumatic, normocephalic Chest: B/L clear to auscultation anteriorly CVS:S1S2 regular Abdomen:soft non tender, non distended Extremities:no edema Neurology: Non focal Skin: no rash   I have personally reviewed following labs and imaging studies  LABORATORY DATA: CBC: Recent Labs  Lab 05/13/21 0308 05/14/21 0040 05/15/21 0607 05/16/21 0845 05/18/21 0556  WBC 11.8* 11.0* 8.2 7.8 9.2  NEUTROABS 10.2*  --   --   --   --   HGB 8.9* 9.2* 10.1* 9.8* 10.0*  HCT 28.1* 28.3* 30.7* 30.1* 30.6*  MCV 90.6 86.5 87.2 86.2 86.2  PLT 268 299 130* 334 394     Basic Metabolic Panel: Recent Labs  Lab 05/13/21 0308 05/14/21 0040 05/15/21 0607 05/16/21 0845 05/18/21 0556  NA 134* 131* 137 136 132*  K 3.9 3.9 3.8 3.9 4.4  CL 98 97* 102 97* 95*  CO2 29 26 27 28 28   GLUCOSE 152* 142* 109* 89 95  BUN <5* 13 5* 6 19  CREATININE 0.66 0.83 0.75 0.86 0.82  CALCIUM 8.8* 8.5* 9.1 8.7* 9.6     GFR: Estimated  Creatinine Clearance: 127.6 mL/min (by C-G formula based on SCr of 0.82 mg/dL).  Liver Function Tests: Recent Labs  Lab 05/14/21 0040  AST 12*  ALT 11  ALKPHOS 66  BILITOT 0.3  PROT 6.5  ALBUMIN 2.3*    No results for input(s): LIPASE, AMYLASE in the last 168 hours. No results for input(s): AMMONIA in the last 168 hours.  Coagulation Profile: No results for input(s): INR, PROTIME in the last 168 hours.  Cardiac Enzymes: No results for input(s): CKTOTAL, CKMB, CKMBINDEX, TROPONINI in the last 168 hours.  BNP (last 3 results) No results for input(s): PROBNP in the last 8760 hours.  Lipid  Profile: No results for input(s): CHOL, HDL, LDLCALC, TRIG, CHOLHDL, LDLDIRECT in the last 72 hours.  Thyroid Function Tests: No results for input(s): TSH, T4TOTAL, FREET4, T3FREE, THYROIDAB in the last 72 hours.  Anemia Panel: No results for input(s): VITAMINB12, FOLATE, FERRITIN, TIBC, IRON, RETICCTPCT in the last 72 hours.  Urine analysis:    Component Value Date/Time   COLORURINE YELLOW 04/19/2021 1546   APPEARANCEUR HAZY (A) 04/19/2021 1546   LABSPEC 1.021 04/19/2021 1546   PHURINE 5.0 04/19/2021 1546   GLUCOSEU NEGATIVE 04/19/2021 1546   HGBUR LARGE (A) 04/19/2021 1546   BILIRUBINUR NEGATIVE 04/19/2021 1546   KETONESUR NEGATIVE 04/19/2021 1546   PROTEINUR 30 (A) 04/19/2021 1546   UROBILINOGEN 1.0 02/09/2010 2121   NITRITE NEGATIVE 04/19/2021 1546   LEUKOCYTESUR NEGATIVE 04/19/2021 1546    Sepsis Labs: Lactic Acid, Venous    Component Value Date/Time   LATICACIDVEN 0.8 05/13/2021 0310    MICROBIOLOGY: Recent Results (from the past 240 hour(s))  Blood culture (routine x 2)     Status: Abnormal   Collection Time: 05/13/21  3:08 AM   Specimen: BLOOD RIGHT HAND  Result Value Ref Range Status   Specimen Description BLOOD RIGHT HAND  Final   Special Requests   Final    BOTTLES DRAWN AEROBIC AND ANAEROBIC Blood Culture adequate volume   Culture  Setup Time   Final     GRAM POSITIVE COCCI IN CLUSTERS IN BOTH AEROBIC AND ANAEROBIC BOTTLES CRITICAL RESULT CALLED TO, READ BACK BY AND VERIFIED WITHArnaldo Natal [facility] 2116 05/13/21 A BROWNING Performed at [facility], [address] [place], [place] [address]    Culture METHICILLIN RESISTANT STAPHYLOCOCCUS AUREUS (A)  Final   Report Status 05/15/2021 FINAL  Final   Organism ID, Bacteria METHICILLIN RESISTANT STAPHYLOCOCCUS AUREUS  Final      Susceptibility   Methicillin resistant staphylococcus aureus - MIC*    CIPROFLOXACIN >=8 RESISTANT Resistant     ERYTHROMYCIN >=8 RESISTANT Resistant     GENTAMICIN <=0.5 SENSITIVE Sensitive     OXACILLIN >=4 RESISTANT Resistant     TETRACYCLINE <=1 SENSITIVE Sensitive     VANCOMYCIN <=0.5 SENSITIVE Sensitive     TRIMETH/SULFA <=10 SENSITIVE Sensitive     CLINDAMYCIN <=0.25 SENSITIVE Sensitive     RIFAMPIN <=0.5 SENSITIVE Sensitive     Inducible Clindamycin NEGATIVE Sensitive     * METHICILLIN RESISTANT STAPHYLOCOCCUS AUREUS  Blood Culture ID Panel (Reflexed)     Status: Abnormal   Collection Time: 05/13/21  3:08 AM  Result Value Ref Range Status   Enterococcus faecalis NOT DETECTED NOT DETECTED Final   Enterococcus Faecium NOT DETECTED NOT DETECTED Final   Listeria monocytogenes NOT DETECTED NOT DETECTED Final   Staphylococcus species DETECTED (A) NOT DETECTED Final    Comment: CRITICAL RESULT CALLED TO, READ BACK BY AND VERIFIED WITH: Arnaldo Natal PHARMD 2116 05/13/21 A BROWNING    Staphylococcus aureus (BCID) DETECTED (A) NOT DETECTED Final    Comment: Methicillin (oxacillin)-resistant Staphylococcus aureus (MRSA). MRSA is predictably resistant to beta-lactam antibiotics (except ceftaroline). Preferred therapy is vancomycin unless clinically contraindicated. Patient requires contact precautions if  hospitalized. CRITICAL RESULT CALLED TO, READ BACK BY AND VERIFIED WITH: Arnaldo Natal [facility] 2116 05/13/21 A BROWNING    Staphylococcus epidermidis NOT DETECTED NOT  DETECTED Final   Staphylococcus lugdunensis NOT DETECTED NOT DETECTED Final   Streptococcus species NOT DETECTED NOT DETECTED Final   Streptococcus agalactiae NOT DETECTED NOT DETECTED Final   Streptococcus pneumoniae NOT DETECTED  NOT DETECTED Final   Streptococcus pyogenes NOT DETECTED NOT DETECTED Final   A.calcoaceticus-baumannii NOT DETECTED NOT DETECTED Final   Bacteroides fragilis NOT DETECTED NOT DETECTED Final   Enterobacterales NOT DETECTED NOT DETECTED Final   Enterobacter cloacae complex NOT DETECTED NOT DETECTED Final   Escherichia coli NOT DETECTED NOT DETECTED Final   Klebsiella aerogenes NOT DETECTED NOT DETECTED Final   Klebsiella oxytoca NOT DETECTED NOT DETECTED Final   Klebsiella pneumoniae NOT DETECTED NOT DETECTED Final   Proteus species NOT DETECTED NOT DETECTED Final   Salmonella species NOT DETECTED NOT DETECTED Final   Serratia marcescens NOT DETECTED NOT DETECTED Final   Haemophilus influenzae NOT DETECTED NOT DETECTED Final   Neisseria meningitidis NOT DETECTED NOT DETECTED Final   Pseudomonas aeruginosa NOT DETECTED NOT DETECTED Final   Stenotrophomonas maltophilia NOT DETECTED NOT DETECTED Final   Candida albicans NOT DETECTED NOT DETECTED Final   Candida auris NOT DETECTED NOT DETECTED Final   Candida glabrata NOT DETECTED NOT DETECTED Final   Candida krusei NOT DETECTED NOT DETECTED Final   Candida parapsilosis NOT DETECTED NOT DETECTED Final   Candida tropicalis NOT DETECTED NOT DETECTED Final   Cryptococcus neoformans/gattii NOT DETECTED NOT DETECTED Final   Meth resistant mecA/C and MREJ DETECTED (A) NOT DETECTED Final    Comment: CRITICAL RESULT CALLED TO, READ BACK BY AND VERIFIED WITHArnaldo Natal [facility] 2116 05/13/21 A BROWNING Performed at [facility], [address] [place], [place] [address]   Blood culture (routine x 2)     Status: Abnormal   Collection Time: 05/13/21  3:09 AM   Specimen: BLOOD LEFT HAND  Result Value Ref Range Status    Specimen Description BLOOD LEFT HAND  Final   Special Requests   Final    BOTTLES DRAWN AEROBIC AND ANAEROBIC Blood Culture adequate volume   Culture  Setup Time   Final    GRAM POSITIVE COCCI IN CLUSTERS AEROBIC BOTTLE ONLY CRITICAL VALUE NOTED.  VALUE IS CONSISTENT WITH PREVIOUSLY REPORTED AND CALLED VALUE.    Culture (A)  Final    STAPHYLOCOCCUS AUREUS SUSCEPTIBILITIES PERFORMED ON PREVIOUS CULTURE WITHIN THE LAST 5 DAYS. Performed at [facility], [address] [place], [place] [address]    Report Status 05/16/2021 FINAL  Final  Resp Panel by RT-PCR (Flu A&B, Covid) Nasopharyngeal Swab     Status: Abnormal   Collection Time: 05/13/21  8:07 AM   Specimen: Nasopharyngeal Swab; Nasopharyngeal(NP) swabs in vial transport medium  Result Value Ref Range Status   SARS Coronavirus 2 by RT PCR POSITIVE (A) NEGATIVE Final    Comment: RESULT CALLED TO, READ BACK BY AND VERIFIED WITH: RN Cherlyn Labella [id number] AT 1016 BY CM (NOTE) SARS-CoV-2 target nucleic acids are DETECTED.  The SARS-CoV-2 RNA is generally detectable in upper respiratory specimens during the acute phase of infection. Positive results are indicative of the presence of the identified virus, but do not rule out bacterial infection or co-infection with other pathogens not detected by the test. Clinical correlation with patient history and other diagnostic information is necessary to determine patient infection status. The expected result is Negative.  Fact Sheet for Patients: BloggerCourse.com  Fact Sheet for Healthcare Providers: SeriousBroker.it  This test is not yet approved or cleared by the [place] FDA and  has been authorized for detection and/or diagnosis of SARS-CoV-2 by FDA under an Emergency Use Authorization (EUA).  This EUA will remain in effect (meaning this test can b e  used) for the duration of  the COVID-19 declaration under Section 564(b)(1)  of the Act, 21 U.S.C. section 360bbb-3(b)(1), unless the authorization is terminated or revoked sooner.     Influenza A by PCR NEGATIVE NEGATIVE Final   Influenza B by PCR NEGATIVE NEGATIVE Final    Comment: (NOTE) The Xpert Xpress SARS-CoV-2/FLU/RSV plus assay is intended as an aid in the diagnosis of influenza from Nasopharyngeal swab specimens and should not be used as a sole basis for treatment. Nasal washings and aspirates are unacceptable for Xpert Xpress SARS-CoV-2/FLU/RSV testing.  Fact Sheet for Patients: BloggerCourse.comhttps://www.fda.gov/media/152166/download  Fact Sheet for Healthcare Providers: SeriousBroker.ithttps://www.fda.gov/media/152162/download  This test is not yet approved or cleared by the [place] FDA and has been authorized for detection and/or diagnosis of SARS-CoV-2 by FDA under an Emergency Use Authorization (EUA). This EUA will remain in effect (meaning this test can be used) for the duration of the COVID-19 declaration under Section 564(b)(1) of the Act, 21 U.S.C. section 360bbb-3(b)(1), unless the authorization is terminated or revoked.  Performed at [facility], [address] [place], [place] [address]   Culture, blood (routine x 2)     Status: None (Preliminary result)   Collection Time: 05/15/21  6:07 AM   Specimen: BLOOD  Result Value Ref Range Status   Specimen Description BLOOD BLOOD LEFT HAND  Final   Special Requests   Final    AEROBIC BOTTLE ONLY Blood Culture results may not be optimal due to an inadequate volume of blood received in culture bottles   Culture   Final    NO GROWTH 4 DAYS Performed at [facility], [address] [place], [place] [address]    Report Status PENDING  Incomplete  Culture, blood (routine x 2)     Status: None (Preliminary result)   Collection Time: 05/15/21  6:07 AM   Specimen: BLOOD  Result Value Ref Range Status   Specimen Description BLOOD BLOOD RIGHT HAND  Final   Special Requests   Final    AEROBIC BOTTLE  ONLY Blood Culture results may not be optimal due to an inadequate volume of blood received in culture bottles   Culture   Final    NO GROWTH 4 DAYS Performed at [facility], [address] [place], [place] [address]    Report Status PENDING  Incomplete    RADIOLOGY STUDIES/RESULTS: US EKG SITE RITE  Result Date: 05/19/2021 If Site Rite image not attached, placement could not be confirmed due to current cardiac rhythm.    LOS: 6 days   Jeoffrey MassedShanker Aylee Littrell, MD  [facility]    To contact the attending provider between 7A-7P or the covering provider during after hours 7P-7A, please log into the web site [email or web address] and access using universal [place] password for that web site. If you do not have the password, please call the hospital operator.  05/19/2021, 12:50 PM

## 2021-05-19 NOTE — Progress Notes (Addendum)
[facility]    Date of Admission:  05/13/2021   Total days of antibiotics 6   ID: Duane Price is a 35 y.o. male with  MRSA bacteremia, and thoracolumbar discitis with recent hx of TV endocarditis and covid + Principal Problem:   Discitis, unspecified, thoracolumbar region Active Problems:   Normocytic anemia   IV drug abuse (HCC)   Endocarditis of tricuspid valve   HCV antibody positive   Osteomyelitis (HCC)   Leukocytosis   History of bacteremia   History of pulmonary embolus (PE)   Homeless   Polysubstance abuse (HCC)    Subjective: Still remains asymptomatic from respiratory standpoint. He recalls on his last admission he had lost of taste and semll but did nto repeat covid testing at that time  He is noticing pain at PIV site in left forearm. No erythema but swelling and tenderness when palpating  Medications:   apixaban  5 mg Oral BID   feeding supplement  237 mL Oral BID BM   lidocaine  1 patch Transdermal Q24H   melatonin  3 mg Oral QHS   methadone  25 mg Oral Q12H   nicotine  14 mg Transdermal Daily   pantoprazole  40 mg Oral Daily   polyethylene glycol  17 g Oral BID   senna  2 tablet Oral QHS   sodium chloride flush  3 mL Intravenous Q12H    Objective: Vital signs in last 24 hours: Temp:  [98 F (36.7 C)-98.5 F (36.9 C)] 98.4 F (36.9 C) (08/25 0746) Pulse Rate:  [71-80] 76 (08/25 0742) Resp:  [12-18] 16 (08/25 0433) BP: (97-103)/(54-61) 97/54 (08/25 0433) SpO2:  [95 %-98 %] 98 % (08/25 0742) Physical Exam  Constitutional: He is oriented to person, place, and time. He appears well-developed and well-nourished. No distress.  HENT:  Mouth/Throat: Oropharynx is clear and moist. No oropharyngeal exudate.  Cardiovascular: Normal rate, regular rhythm and normal heart sounds. Exam reveals no gallop and no friction rub.  No murmur heard.  Pulmonary/Chest: Effort normal and breath sounds normal. No respiratory distress. He has  no wheezes.  Abdominal: Soft. Bowel sounds are normal. He exhibits no distension. There is no tenderness.  Skin= left forearm some minimal swelling proximal to piv. TTP no erythema or drainage from site Neurological: He is alert and oriented to person, place, and time.  Skin: Skin is warm and dry. No rash noted. No erythema.  Psychiatric: He has a normal mood and affect. His behavior is normal.    Lab Results Recent Labs    05/18/21 0556  WBC 9.2  HGB 10.0*  HCT 30.6*  NA 132*  K 4.4  CL 95*  CO2 28  BUN 19  CREATININE 0.82   Lab Results  Component Value Date   ESRSEDRATE 95 (H) 05/13/2021     Microbiology: reviewed Studies/Results: [place] EKG SITE RITE  Result Date: 05/19/2021 If Site Rite image not attached, placement could not be confirmed due to current cardiac rhythm.    Assessment/Plan: MRSA bacteremia with thoracolumbar discitis = back pain improved. Continue on vancomycin. Will d/c piv and get PICC line today. Will need IV abtx and plan to stay here due to homelessness and iv use. Recommend repeat TTE today/tomorrow  COVID illness = asymptomatic. Possibly contracted at his recent previous hospitalization due to loss of taste/smell. Day 7 tomorrow. Will repeat pcr testing to see if negative vs. Keep on isolation for 10 days. I suspect that he has  recovered covid due to clinical history. His test result from 8/19 CT value of 36.3 (those greater than 32-considered as recovered/noninfectious range)  Opiate dependence = pain managed on methadone 25mg  bid  [facility] for Infectious Diseases Cell: [phone] Pager: [phone]  05/19/2021, 10:17 AM

## 2021-05-20 ENCOUNTER — Inpatient Hospital Stay (HOSPITAL_COMMUNITY): Payer: Self-pay

## 2021-05-20 DIAGNOSIS — I38 Endocarditis, valve unspecified: Secondary | ICD-10-CM

## 2021-05-20 LAB — BASIC METABOLIC PANEL
Anion gap: 9 (ref 5–15)
BUN: 23 mg/dL — ABNORMAL HIGH (ref 6–20)
CO2: 30 mmol/L (ref 22–32)
Calcium: 9.4 mg/dL (ref 8.9–10.3)
Chloride: 92 mmol/L — ABNORMAL LOW (ref 98–111)
Creatinine, Ser: 1.01 mg/dL (ref 0.61–1.24)
GFR, Estimated: >60 mL/min (ref >60)
Glucose, Bld: 97 mg/dL (ref 70–99)
Potassium: 4.5 mmol/L (ref 3.5–5.1)
Sodium: 131 mmol/L — ABNORMAL LOW (ref 135–145)

## 2021-05-20 LAB — ECHOCARDIOGRAM COMPLETE
AR max vel: 3.48 cm2
AV Area VTI: 3.28 cm2
AV Area mean vel: 3.13 cm2
AV Mean grad: 2 mmHg
AV Peak grad: 4.3 mmHg
Ao pk vel: 1.04 m/s
Calc EF: 60.3 %
S' Lateral: 2.8 cm
Single Plane A2C EF: 64 %
Single Plane A4C EF: 58.5 %
Weight: 2483.26 oz

## 2021-05-20 LAB — CBC
HCT: 29.7 % — ABNORMAL LOW (ref 39.0–52.0)
Hemoglobin: 9.8 g/dL — ABNORMAL LOW (ref 13.0–17.0)
MCH: 28.2 pg (ref 26.0–34.0)
MCHC: 33 g/dL (ref 30.0–36.0)
MCV: 85.3 fL (ref 80.0–100.0)
Platelets: 397 10*3/uL (ref 150–400)
RBC: 3.48 MIL/uL — ABNORMAL LOW (ref 4.22–5.81)
RDW: 14.7 % (ref 11.5–15.5)
WBC: 10.9 10*3/uL — ABNORMAL HIGH (ref 4.0–10.5)
nRBC: 0 % (ref 0.0–0.2)

## 2021-05-20 LAB — CULTURE, BLOOD (ROUTINE X 2)
Culture: NO GROWTH
Culture: NO GROWTH

## 2021-05-20 LAB — VANCOMYCIN, PEAK: Vancomycin Pk: 30 ug/mL (ref 30–40)

## 2021-05-20 LAB — VANCOMYCIN, TROUGH: Vancomycin Tr: 12 ug/mL — ABNORMAL LOW (ref 15–20)

## 2021-05-20 IMAGING — US US EXTREM UP*L* LTD
1 series · 14 of 17 positions shown · non-contrast
Comparison: None.

CLINICAL DATA: Left volar forearm swelling for the past week.

EXAM:
ULTRASOUND LEFT UPPER EXTREMITY LIMITED
TECHNIQUE: Ultrasound examination of the upper extremity soft tissues was
performed in the area of clinical concern.

[Series 1: us soft tissue upper extremity limited left (non-v · 17 acquisitions, 14 frames shown]
[im 1/17]
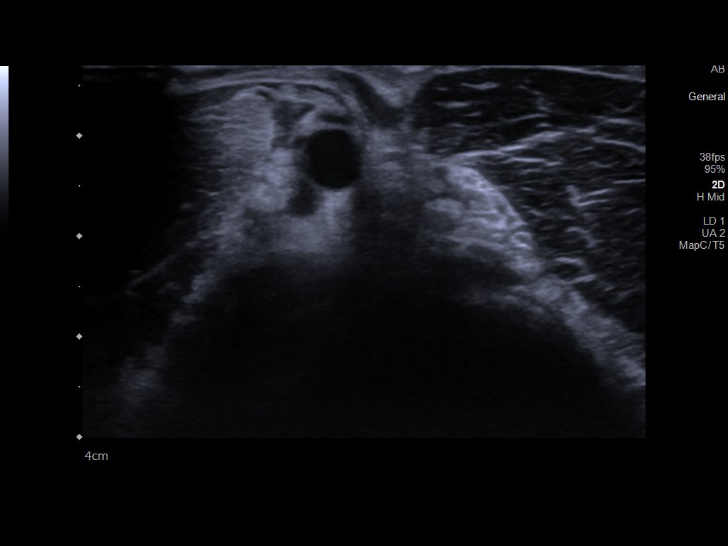
[im 2/17]
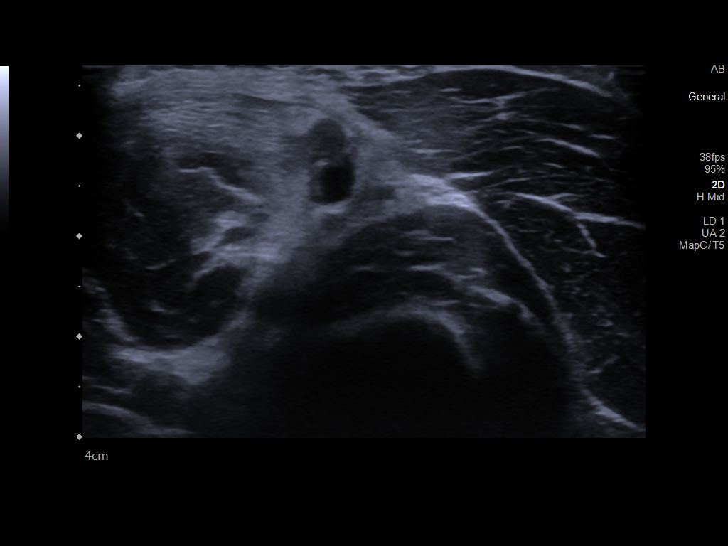
[im 4/17]
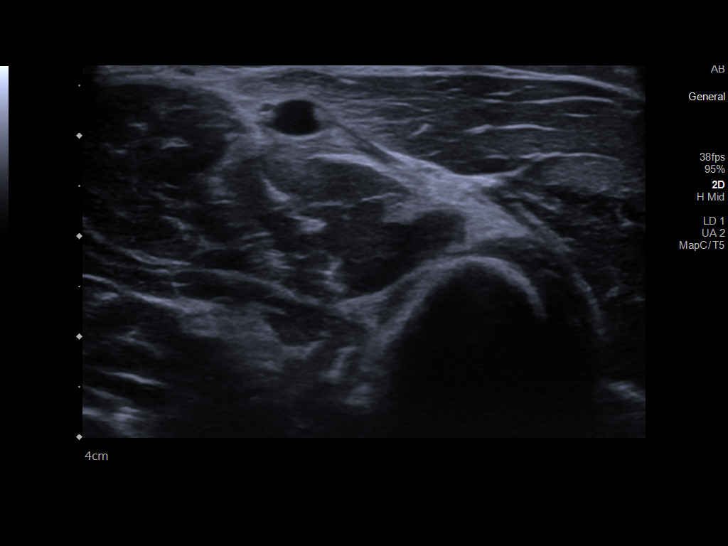
[im 5/17]
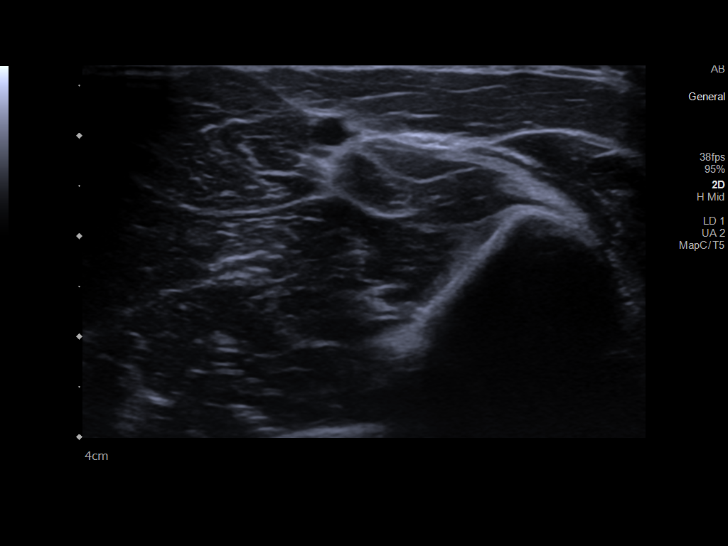
[im 6/17]
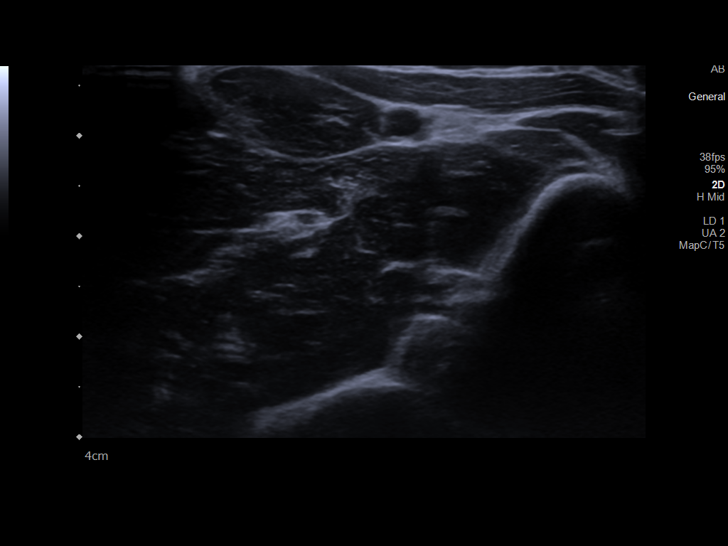
[im 7/17]
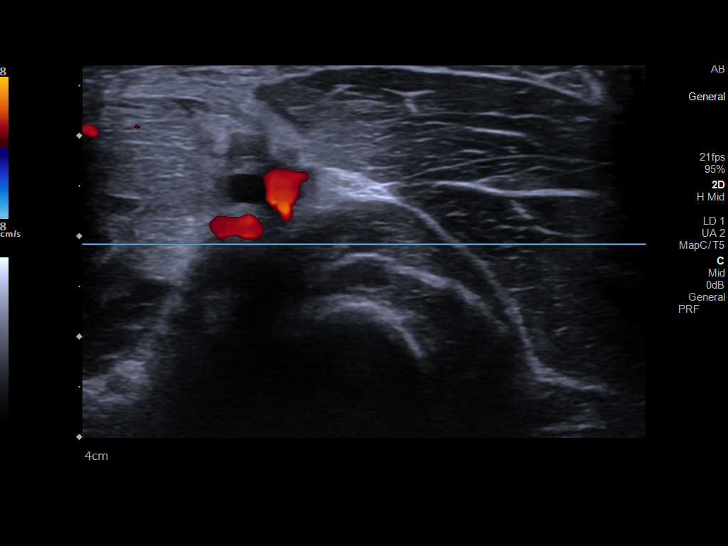
[im 8/17]
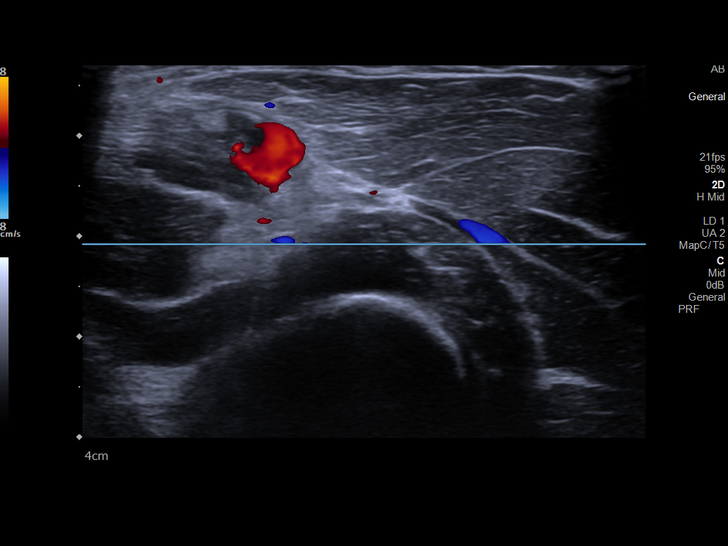
[im 10/17]
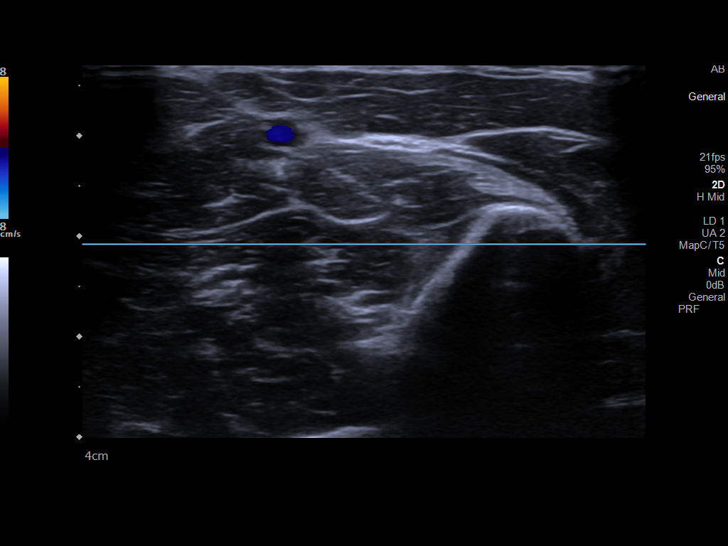
[im 11/17]
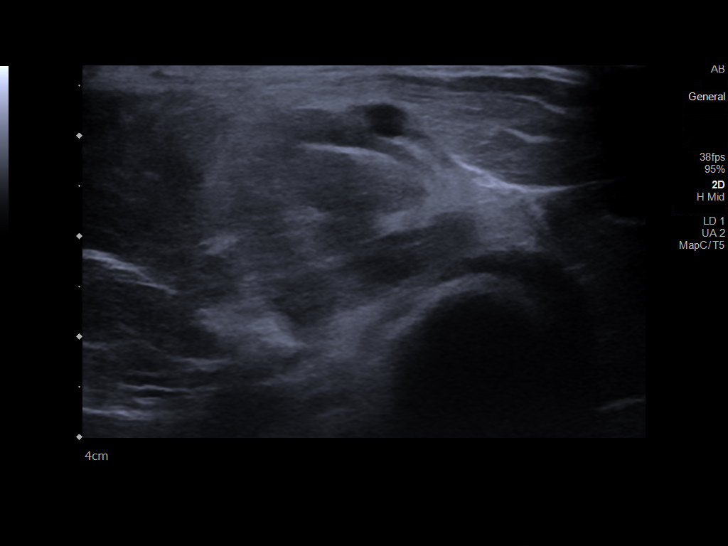
[im 12/17]
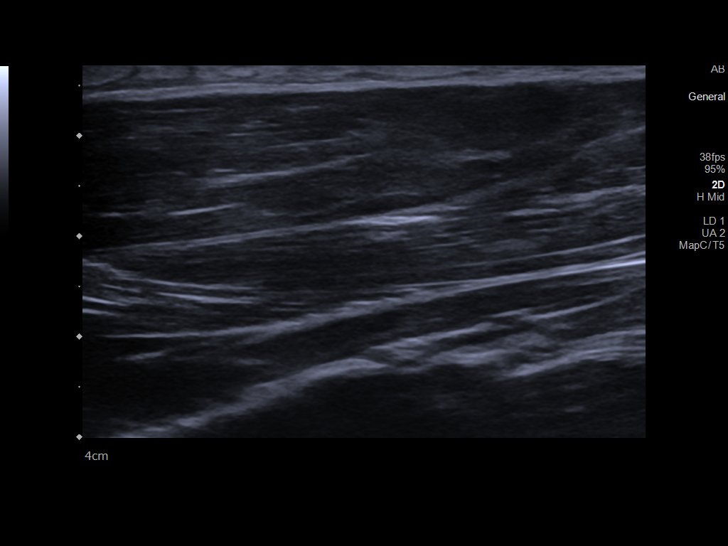
[im 13/17]
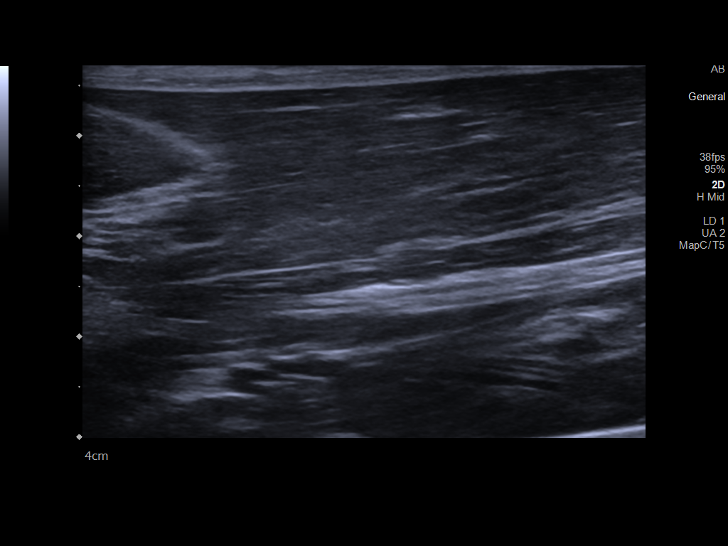
[im 14/17]
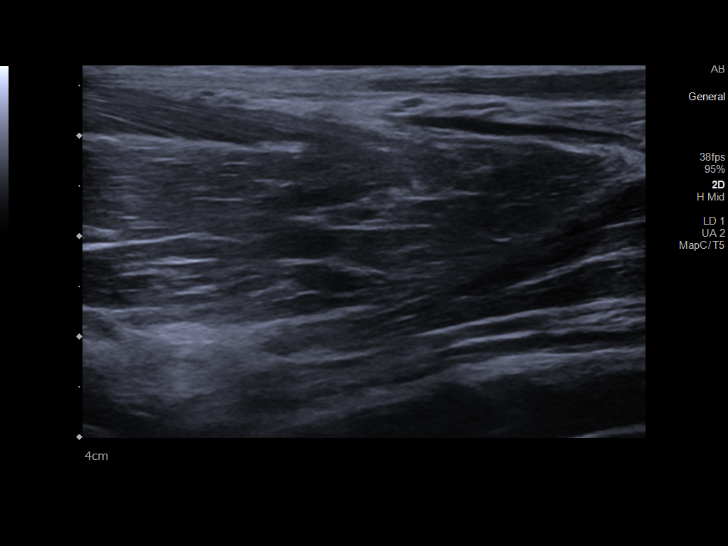
[im 16/17]
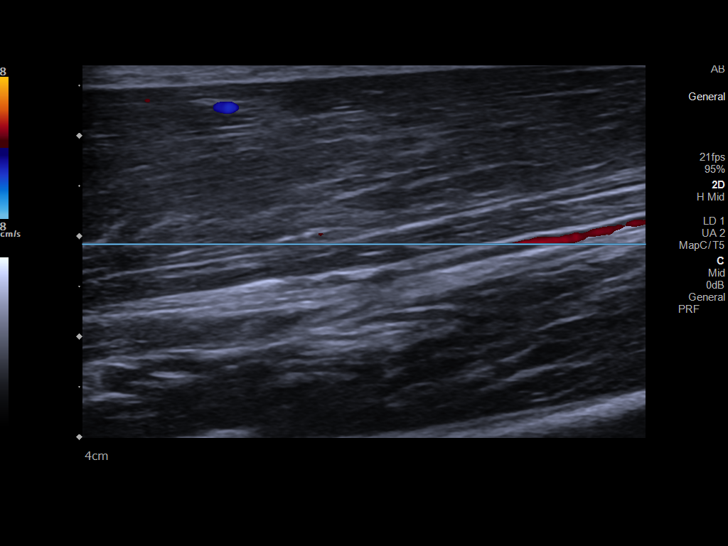
[im 17/17]
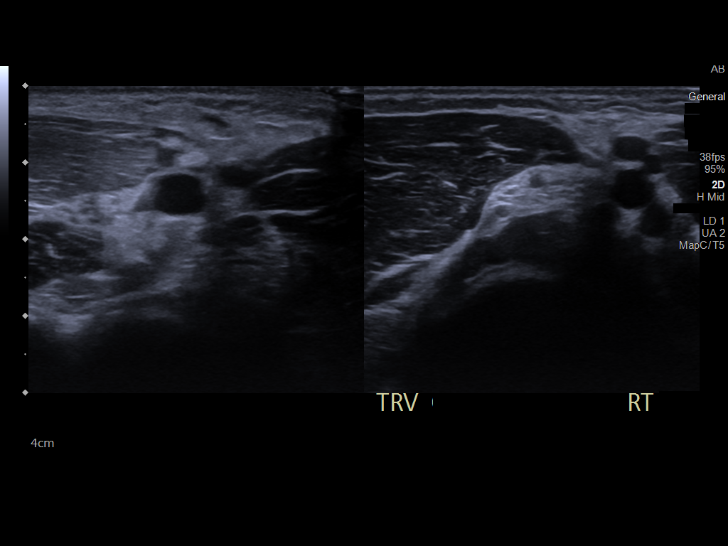

[14 of 17 positions shown; findings below may reference images not displayed]

FINDINGS: Focused ultrasound of the volar left forearm demonstrates no
discrete soft tissue mass or fluid collection.
IMPRESSION: 1. Negative. No soft tissue mass or fluid collection.

## 2021-05-20 MED ORDER — VANCOMYCIN HCL 750 MG/150ML IV SOLN
750.0000 mg | Freq: Two times a day (BID) | INTRAVENOUS | Status: DC
  Administered 2021-05-20 – 2021-05-27 (×15): 750 mg via INTRAVENOUS
  Filled 2021-05-20 (×15): qty 150

## 2021-05-20 NOTE — Progress Notes (Signed)
PROGRESS NOTE        PATIENT DETAILS Name: Duane Price Age: 35 y.o. Sex: male Date of Birth: 09-08-1986 Admit Date: 05/13/2021 Admitting Physician Clydie Braun, MD UTM:[id number], No Pcp Per (Inactive)  Brief Narrative: Patient is a 35 y.o. 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  Acknowledges noncompliance to medications (claims Zyvox got lost) and ongoing IV heroin use.  Significant events: 7/1-8/1>> hospitalization for MRSA bacteremia with septic pulmonary emboli and tricuspid valve endocarditis-s/p angio vac debridement.  Discharged on oral linezolid 8/19>> admit for worsening back pain-found to have T12-L1 discitis.    Significant studies: 8/19>> MRI thoracolumbar spine: Early discitis/osteomyelitis T12-L1 8/26>> small mobile density on TV-not significantly changed from prior exam.  Antimicrobial therapy: Vancomycin: 8/19>>  Microbiology data: 8/19>> blood culture : MRSA  8/21>> blood culture: No growth  Procedures : None  Consults: Infectious disease  DVT Prophylaxis : apixaban (ELIQUIS) tablet 5 mg   Subjective: Lying comfortably in bed-no chest pain or shortness of breath.  Back pain is well controlled.   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  Repeat echo with small-but unchanged vegetation from prior.  ID following.  Back pain well controlled with methadone and as needed oxycodone.  Patient aware that when we get closer to discharge-he will need to call the methadone clinic and get himself  established.  Small area of induration and proximal left forearm: Probably due to recent IV-no signs of thrombophlebitis.  Exam is benign.  No longer with peripheral line-ID recommending central line-for PICC line later today.  COVID-19 infection: Clearly asymptomatic-maintain on isolation for 10 days.  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  Will need social work counseling when closer to discharge.  GERD: PPI  Homelessness  Diet: Diet Order             Diet regular Room service appropriate? Yes; Fluid consistency: Thin  Diet effective now                    Code Status: Full code   Family Communication: None at bedside  Disposition Plan: Status is: Inpatient  Remains inpatient appropriate because:Inpatient level of care appropriate due to severity of illness  Dispo: The patient is from: Home              Anticipated d/c is to: Home              Patient currently is not medically stable to d/c.   Difficult to place patient No   Barriers to Discharge: MRSA bacteremia-not a candidate for outpatient IV antimicrobial therapy.  Needs to remain inpatient until he clears the bacteremia.  Antimicrobial agents: Anti-infectives (From admission, onward)    Start     Dose/Rate Route Frequency Ordered Stop   05/20/21 1215  vancomycin (VANCOREADY) IVPB 750 mg/150 mL        750 mg 150 mL/hr over 60 Minutes Intravenous Every 12 hours 05/20/21 1115     05/16/21 2200  vancomycin (VANCOCIN) IVPB 1000 mg/200 mL premix  Status:  Discontinued        1,000 mg 200 mL/hr over 60 Minutes Intravenous Every 12 hours 05/16/21 1826 05/20/21 1113   05/13/21 1600  vancomycin (VANCOCIN) IVPB 1000 mg/200 mL premix  Status:  Discontinued         1,000 mg 200 mL/hr over 60 Minutes Intravenous Every 8 hours 05/13/21 0818 05/16/21 1301   05/13/21 0715  vancomycin (VANCOREADY) IVPB 1250 mg/250 mL        1,250 mg 166.7 mL/hr over 90 Minutes Intravenous  Once 05/13/21 0655 05/13/21 0925        Time spent: 15 minutes-Greater than 50% of this time was spent in counseling, explanation of diagnosis, planning of further management, and coordination of care.  MEDICATIONS: Scheduled Meds:  apixaban  5 mg Oral BID   feeding supplement  237 mL Oral BID BM   lidocaine  1 patch Transdermal Q24H   melatonin  3 mg Oral QHS   methadone  25 mg Oral Q12H   nicotine  14 mg Transdermal Daily   pantoprazole  40 mg Oral Daily   polyethylene glycol  17 g Oral BID   senna  2 tablet Oral QHS   sodium chloride flush  3 mL Intravenous Q12H   Continuous Infusions:  vancomycin     PRN Meds:.acetaminophen **OR** acetaminophen, albuterol, bisacodyl, hydrOXYzine, naLOXone (NARCAN)  injection, ondansetron **OR** ondansetron (ZOFRAN) IV, oxyCODONE, sodium phosphate   PHYSICAL EXAM: Vital signs: Vitals:   05/19/21 2013 05/19/21 2317 05/20/21 0428 05/20/21 0832  BP: (!) 108/58 91/75 (!) 90/54 101/60  Pulse: 79 80 71 69  Resp: Temp: 98.6 F (37 C) 99.5 F (37.5 C) 98.6 F (37 C) 98.2 F (36.8 C)  TempSrc: Oral Oral Oral Oral  SpO2: 97% 93% 94%   Weight:       Filed Weights   05/13/21 0700 05/14/21 0400  Weight: 70.9 kg 70.4 kg   Body mass index is 21.05 kg/m.   Gen Exam:Alert awake-not in any distress HEENT:atraumatic, normocephalic Chest: B/L clear to auscultation anteriorly CVS:S1S2 regular Abdomen:soft non tender, non distended Extremities:no edema Neurology: Non focal Skin: no rash   I have personally reviewed following labs and imaging studies  LABORATORY DATA: CBC: Recent Labs  Lab 05/14/21 0040 05/15/21 0607 05/16/21 0845 05/18/21 0556 05/20/21 0139  WBC 11.0* 8.2 7.8 9.2 10.9*  HGB 9.2* 10.1* 9.8*  10.0* 9.8*  HCT 28.3* 30.7* 30.1* 30.6* 29.7*  MCV 86.5 87.2 86.2 86.2 85.3  PLT 299 130* 334 394 397     Basic Metabolic Panel: Recent Labs  Lab 05/14/21 0040 05/15/21 0607 05/16/21 0845 05/18/21 0556 05/20/21 0139  NA 131* 137 136 132* 131*  K 3.9 3.8 3.9 4.4 4.5  CL 97* 102 97* 95* 92*  CO2 GLUCOSE 142* 109* 89 95 97  BUN 13 5* 6 19 23*  CREATININE 0.83 0.75 0.86 0.82 1.01  CALCIUM 8.5* 9.1 8.7* 9.6 9.4     GFR: Estimated Creatinine Clearance: 103.6 mL/min (by C-G formula based on SCr of 1.01 mg/dL).  Liver Function Tests: Recent Labs  Lab 05/14/21 0040  AST 12*  ALT 11  ALKPHOS 66  BILITOT 0.3  PROT 6.5  ALBUMIN 2.3*    No results for input(s): LIPASE, AMYLASE in the last 168 hours. No results for input(s): AMMONIA in the last 168 hours.  Coagulation Profile: No results for input(s): INR, PROTIME in the last 168 hours.  Cardiac Enzymes: No results for input(s): CKTOTAL, CKMB, CKMBINDEX, TROPONINI in the last 168 hours.  BNP (last 3 results) No results for input(s): PROBNP in the last 8760 hours.  Lipid Profile: No results for input(s): CHOL, HDL, LDLCALC, TRIG, CHOLHDL, LDLDIRECT in the last 72 hours.  Thyroid Function Tests: No results for input(s): TSH, T4TOTAL, FREET4, T3FREE, THYROIDAB in the last 72 hours.  Anemia Panel: No results for input(s): VITAMINB12, FOLATE, FERRITIN, TIBC, IRON, RETICCTPCT in the last 72 hours.  Urine analysis:    Component Value Date/Time   COLORURINE YELLOW 04/19/2021 1546   APPEARANCEUR HAZY (A) 04/19/2021 1546   LABSPEC 1.021 04/19/2021 1546   PHURINE 5.0 04/19/2021 1546   GLUCOSEU NEGATIVE 04/19/2021 1546   HGBUR LARGE (A) 04/19/2021 1546   BILIRUBINUR NEGATIVE 04/19/2021 1546   KETONESUR NEGATIVE 04/19/2021 1546   PROTEINUR 30 (A) 04/19/2021 1546   UROBILINOGEN 1.0 02/09/2010 2121   NITRITE NEGATIVE 04/19/2021 1546   LEUKOCYTESUR NEGATIVE 04/19/2021 1546    Sepsis Labs: Lactic Acid,  Venous    Component Value Date/Time   LATICACIDVEN 0.8 05/13/2021 0310    MICROBIOLOGY: Recent Results (from the past 240 hour(s))  Blood culture (routine x 2)     Status: Abnormal   Collection Time: 05/13/21  3:08 AM   Specimen: BLOOD RIGHT HAND  Result Value Ref Range Status   Specimen Description BLOOD RIGHT HAND  Final   Special Requests   Final    BOTTLES DRAWN AEROBIC AND ANAEROBIC Blood Culture adequate volume   Culture  Setup Time   Final    GRAM POSITIVE COCCI IN CLUSTERS IN BOTH AEROBIC AND ANAEROBIC BOTTLES CRITICAL RESULT CALLED TO, READ BACK BY AND VERIFIED WITHArnaldo Natal: J [facility]NEY [facility] 05/13/21 A BROWNING Performed at [facility], [address] [place], [place] [address]    Culture METHICILLIN RESISTANT STAPHYLOCOCCUS AUREUS (A)  Final   Report Status 05/15/2021 FINAL  Final   Organism ID, Bacteria METHICILLIN RESISTANT STAPHYLOCOCCUS AUREUS  Final      Susceptibility   Methicillin resistant staphylococcus aureus - MIC*    CIPROFLOXACIN >=8 RESISTANT Resistant     ERYTHROMYCIN >=8 RESISTANT Resistant     GENTAMICIN <=0.5 SENSITIVE Sensitive     OXACILLIN >=4 RESISTANT Resistant     TETRACYCLINE <=1 SENSITIVE Sensitive     VANCOMYCIN <=0.5 SENSITIVE Sensitive     TRIMETH/SULFA <=10 SENSITIVE Sensitive     CLINDAMYCIN <=0.25 SENSITIVE Sensitive     RIFAMPIN <=0.5 SENSITIVE Sensitive     Inducible Clindamycin NEGATIVE Sensitive     * METHICILLIN RESISTANT STAPHYLOCOCCUS AUREUS  Blood Culture ID Panel (Reflexed)     Status: Abnormal   Collection Time: 05/13/21  3:08 AM  Result Value Ref Range Status   Enterococcus faecalis NOT DETECTED NOT DETECTED Final   Enterococcus Faecium NOT DETECTED NOT DETECTED Final   Listeria monocytogenes NOT DETECTED NOT DETECTED Final   Staphylococcus species DETECTED (A) NOT DETECTED Final    Comment: CRITICAL RESULT CALLED TO, READ BACK BY AND VERIFIED WITH: Arnaldo NatalJ CARNEY PHARMD 2116 05/13/21 A BROWNING    Staphylococcus aureus  (BCID) DETECTED (A) NOT DETECTED Final    Comment: Methicillin (oxacillin)-resistant Staphylococcus aureus (MRSA). MRSA is predictably resistant to beta-lactam antibiotics (except ceftaroline). Preferred  therapy is vancomycin unless clinically contraindicated. Patient requires contact precautions if  hospitalized. CRITICAL RESULT CALLED TO, READ BACK BY AND VERIFIED WITH: Arnaldo Natal PHARMD 2116 05/13/21 A BROWNING    Staphylococcus epidermidis NOT DETECTED NOT DETECTED Final   Staphylococcus lugdunensis NOT DETECTED NOT DETECTED Final   Streptococcus species NOT DETECTED NOT DETECTED Final   Streptococcus agalactiae NOT DETECTED NOT DETECTED Final   Streptococcus pneumoniae NOT DETECTED NOT DETECTED Final   Streptococcus pyogenes NOT DETECTED NOT DETECTED Final   A.calcoaceticus-baumannii NOT DETECTED NOT DETECTED Final   Bacteroides fragilis NOT DETECTED NOT DETECTED Final   Enterobacterales NOT DETECTED NOT DETECTED Final   Enterobacter cloacae complex NOT DETECTED NOT DETECTED Final   Escherichia coli NOT DETECTED NOT DETECTED Final   Klebsiella aerogenes NOT DETECTED NOT DETECTED Final   Klebsiella oxytoca NOT DETECTED NOT DETECTED Final   Klebsiella pneumoniae NOT DETECTED NOT DETECTED Final   Proteus species NOT DETECTED NOT DETECTED Final   Salmonella species NOT DETECTED NOT DETECTED Final   Serratia marcescens NOT DETECTED NOT DETECTED Final   Haemophilus influenzae NOT DETECTED NOT DETECTED Final   Neisseria meningitidis NOT DETECTED NOT DETECTED Final   Pseudomonas aeruginosa NOT DETECTED NOT DETECTED Final   Stenotrophomonas maltophilia NOT DETECTED NOT DETECTED Final   Candida albicans NOT DETECTED NOT DETECTED Final   Candida auris NOT DETECTED NOT DETECTED Final   Candida glabrata NOT DETECTED NOT DETECTED Final   Candida krusei NOT DETECTED NOT DETECTED Final   Candida parapsilosis NOT DETECTED NOT DETECTED Final   Candida tropicalis NOT DETECTED NOT DETECTED Final    Cryptococcus neoformans/gattii NOT DETECTED NOT DETECTED Final   Meth resistant mecA/C and MREJ DETECTED (A) NOT DETECTED Final    Comment: CRITICAL RESULT CALLED TO, READ BACK BY AND VERIFIED WITHArnaldo Natal [facility] 2116 05/13/21 A BROWNING Performed at [facility], [address], [place], [place] [address]   Blood culture (routine x 2)     Status: Abnormal   Collection Time: 05/13/21  3:09 AM   Specimen: BLOOD LEFT HAND  Result Value Ref Range Status   Specimen Description BLOOD LEFT HAND  Final   Special Requests   Final    BOTTLES DRAWN AEROBIC AND ANAEROBIC Blood Culture adequate volume   Culture  Setup Time   Final    GRAM POSITIVE COCCI IN CLUSTERS AEROBIC BOTTLE ONLY CRITICAL VALUE NOTED.  VALUE IS CONSISTENT WITH PREVIOUSLY REPORTED AND CALLED VALUE.    Culture (A)  Final    STAPHYLOCOCCUS AUREUS SUSCEPTIBILITIES PERFORMED ON PREVIOUS CULTURE WITHIN THE LAST 5 DAYS. Performed at [facility], [address] [place], [place] [address]    Report Status 05/16/2021 FINAL  Final  Resp Panel by RT-PCR (Flu A&B, Covid) Nasopharyngeal Swab     Status: Abnormal   Collection Time: 05/13/21  8:07 AM   Specimen: Nasopharyngeal Swab; Nasopharyngeal(NP) swabs in vial transport medium  Result Value Ref Range Status   SARS Coronavirus 2 by RT PCR POSITIVE (A) NEGATIVE Final    Comment: RESULT CALLED TO, READ BACK BY AND VERIFIED WITH: RN Cherlyn Labella [id number] AT 1016 BY CM (NOTE) SARS-CoV-2 target nucleic acids are DETECTED.  The SARS-CoV-2 RNA is generally detectable in upper respiratory specimens during the acute phase of infection. Positive results are indicative of the presence of the identified virus, but do not rule out bacterial infection or co-infection with other pathogens not detected by the test. Clinical correlation with patient history and other  diagnostic information is necessary to determine patient infection status. The expected result is Negative.  Fact  Sheet for Patients: BloggerCourse.com  Fact Sheet for Healthcare Providers: SeriousBroker.it  This test is not yet approved or cleared by the [place] FDA and  has been authorized for detection and/or diagnosis of SARS-CoV-2 by FDA under an Emergency Use Authorization (EUA).  This EUA will remain in effect (meaning this test can b e used) for the duration of  the COVID-19 declaration under Section 564(b)(1) of the Act, 21 U.S.C. section 360bbb-3(b)(1), unless the authorization is terminated or revoked sooner.     Influenza A by PCR NEGATIVE NEGATIVE Final   Influenza B by PCR NEGATIVE NEGATIVE Final    Comment: (NOTE) The Xpert Xpress SARS-CoV-2/FLU/RSV plus assay is intended as an aid in the diagnosis of influenza from Nasopharyngeal swab specimens and should not be used as a sole basis for treatment. Nasal washings and aspirates are unacceptable for Xpert Xpress SARS-CoV-2/FLU/RSV testing.  Fact Sheet for Patients: BloggerCourse.com  Fact Sheet for Healthcare Providers: SeriousBroker.it  This test is not yet approved or cleared by the [place] FDA and has been authorized for detection and/or diagnosis of SARS-CoV-2 by FDA under an Emergency Use Authorization (EUA). This EUA will remain in effect (meaning this test can be used) for the duration of the COVID-19 declaration under Section 564(b)(1) of the Act, 21 U.S.C. section 360bbb-3(b)(1), unless the authorization is terminated or revoked.  Performed at [facility], [address], [place], [place] [address]   Culture, blood (routine x 2)     Status: None   Collection Time: 05/15/21  6:07 AM   Specimen: BLOOD  Result Value Ref Range Status   Specimen Description BLOOD BLOOD LEFT HAND  Final   Special Requests   Final    AEROBIC BOTTLE ONLY Blood Culture results may not be optimal due to an inadequate  volume of blood received in culture bottles   Culture   Final    NO GROWTH 5 DAYS Performed at [facility], [address], [place], [place] [address]    Report Status 05/20/2021 FINAL  Final  Culture, blood (routine x 2)     Status: None   Collection Time: 05/15/21  6:07 AM   Specimen: BLOOD  Result Value Ref Range Status   Specimen Description BLOOD BLOOD RIGHT HAND  Final   Special Requests   Final    AEROBIC BOTTLE ONLY Blood Culture results may not be optimal due to an inadequate volume of blood received in culture bottles   Culture   Final    NO GROWTH 5 DAYS Performed at [facility], [address] [place], [place] [address]    Report Status 05/20/2021 FINAL  Final    RADIOLOGY STUDIES/RESULTS: ECHOCARDIOGRAM COMPLETE  Result Date: 05/20/2021    ECHOCARDIOGRAM REPORT   Patient Name:   Duane Price Date of Exam: 05/20/2021 Medical Rec #:  [id number]     Height:       72.0 in Accession #:    [id number]    Weight:       155.2 lb Date of Birth:  10/10/1985     BSA:          1.913 m Patient Age:    35 years      BP:           101/60 mmHg Patient Gender: M  HR:           70 bpm. Exam Location:  Inpatient Procedure: 2D Echo, Cardiac Doppler and Color Doppler Indications:    Endocarditis  History:        Patient has prior history of Echocardiogram examinations, most                 recent 04/25/2021. Endocarditis; Risk Factors:Former Smoker.  Sonographer:    Neomia Dear RDCS Referring Phys: [id number] Dewayne Shorter M Percy Comp IMPRESSIONS  1. Small, subcentimeter mobile echodensity on tricuspid valve chords, not significantly changed from prior exam. This may represent vegetation given presence on initial echo 03/26/21. The previously noted valvular vegetation from 04/25/21 is no longer clearly seen, though a very small amount of residual vegetation may be present on atrial aspect of valve vs degenerative change. If clinically pertinent to distinguish, can consider TEE.. Tricuspid valve  regurgitation is mild to moderate.  2. Left ventricular ejection fraction, by estimation, is 55 to 60%. The left ventricle has normal function. The left ventricle has no regional wall motion abnormalities. Left ventricular diastolic function was not performed.  3. Right ventricular systolic function is normal. The right ventricular size is mildly enlarged. There is normal pulmonary artery systolic pressure. The estimated right ventricular systolic pressure is 32.8 mmHg.  4. The mitral valve is normal in structure. Trivial mitral valve regurgitation. No evidence of mitral stenosis.  5. The aortic valve is tricuspid. Aortic valve regurgitation is not visualized. No aortic stenosis is present.  6. The inferior vena cava is normal in size with greater than 50% respiratory variability, suggesting right atrial pressure of 3 mmHg. Comparison(s): A prior study was performed on 04/25/21. Prior images reviewed side by side. FINDINGS  Left Ventricle: Left ventricular ejection fraction, by estimation, is 55 to 60%. The left ventricle has normal function. The left ventricle has no regional wall motion abnormalities. The left ventricular internal cavity size was normal in size. There is  no left ventricular hypertrophy. Left ventricular diastolic function could not be evaluated. Right Ventricle: The right ventricular size is mildly enlarged. No increase in right ventricular wall thickness. Right ventricular systolic function is normal. There is normal pulmonary artery systolic pressure. The tricuspid regurgitant velocity is 2.73  m/s, and with an assumed right atrial pressure of 3 mmHg, the estimated right ventricular systolic pressure is 32.8 mmHg. Left Atrium: Left atrial size was normal in size. Right Atrium: Right atrial size was normal in size. Pericardium: There is no evidence of pericardial effusion. Mitral Valve: The mitral valve is normal in structure. Trivial mitral valve regurgitation. No evidence of mitral valve  stenosis. Tricuspid Valve: Small, subcentimeter mobile echodensity on tricuspid valve chords, not significantly changed from prior exam. This may represent vegetation given presence on initial echo 03/26/21. The previously noted valvular vegetation from 04/25/21 is no  longer clearly seen, though a very small amount of residual vegetation may be present on atrial aspect of valve vs degenerative change. If clinically pertinent to distinguish, can consider TEE. The tricuspid valve is normal in structure. Tricuspid valve  regurgitation is mild to moderate. No evidence of tricuspid stenosis. Aortic Valve: The aortic valve is tricuspid. Aortic valve regurgitation is not visualized. No aortic stenosis is present. Aortic valve mean gradient measures 2.0 mmHg. Aortic valve peak gradient measures 4.3 mmHg. Aortic valve area, by VTI measures 3.28 cm. Pulmonic Valve: The pulmonic valve was normal in structure. Pulmonic valve regurgitation is trivial. No evidence of pulmonic stenosis. Aorta: The aortic root  is normal in size and structure. Venous: The inferior vena cava is normal in size with greater than 50% respiratory variability, suggesting right atrial pressure of 3 mmHg. IAS/Shunts: No atrial level shunt detected by color flow Doppler.  LEFT VENTRICLE PLAX 2D LVIDd:         4.80 cm LVIDs:         2.80 cm LV PW:         1.00 cm LV IVS:        1.00 cm LVOT diam:     2.40 cm LV SV:         63 LV SV Index:   33 LVOT Area:     4.52 cm  LV Volumes (MOD) LV vol d, MOD A2C: 79.8 ml LV vol d, MOD A4C: 116.0 ml LV vol s, MOD A2C: 28.7 ml LV vol s, MOD A4C: 48.1 ml LV SV MOD A2C:     51.1 ml LV SV MOD A4C:     116.0 ml LV SV MOD BP:      60.4 ml RIGHT VENTRICLE RV Basal diam:  4.10 cm RV Mid diam:    3.60 cm RV S prime:     12.20 cm/s TAPSE (M-mode): 2.6 cm LEFT ATRIUM             Index       RIGHT ATRIUM           Index LA diam:        2.80 cm 1.46 cm/m  RA Area:     11.80 cm LA Vol (A2C):   65.2 ml 34.08 ml/m RA Volume:   23.10  ml  12.08 ml/m LA Vol (A4C):   46.2 ml 24.15 ml/m LA Biplane Vol: 56.9 ml 29.75 ml/m  AORTIC VALVE                   PULMONIC VALVE AV Area (Vmax):    3.48 cm    PV Vmax:       0.71 m/s AV Area (Vmean):   3.13 cm    PV Vmean:      48.300 cm/s AV Area (VTI):     3.28 cm    PV VTI:        0.116 m AV Vmax:           104.00 cm/s PV Peak grad:  2.0 mmHg AV Vmean:          71.300 cm/s PV Mean grad:  1.0 mmHg AV VTI:            0.193 m AV Peak Grad:      4.3 mmHg AV Mean Grad:      2.0 mmHg LVOT Vmax:         80.10 cm/s LVOT Vmean:        49.300 cm/s LVOT VTI:          0.140 m LVOT/AV VTI ratio: 0.73  AORTA Ao Asc diam: 2.70 cm TRICUSPID VALVE TR Peak grad:   29.8 mmHg TR Vmax:        273.00 cm/s  SHUNTS Systemic VTI:  0.14 m Systemic Diam: 2.40 cm Weston Brass MD Electronically signed by Weston Brass MD Signature Date/Time: 05/20/2021/11:50:53 AM    Final    [place] EKG SITE RITE  Result Date: 05/19/2021 If Site Rite image not attached, placement could not be confirmed due to current cardiac rhythm.    LOS: 7 days   Jeoffrey Massed, MD  [facility]  To contact the attending provider between 7A-7P or the covering provider during after hours 7P-7A, please log into the web site [email or web address] and access using universal  password for that web site. If you do not have the password, please call the hospital operator.  05/20/2021, 12:29 PM

## 2021-05-20 NOTE — Progress Notes (Signed)
At bedside to place PICC.  Patient wishes to wait until for placement.  Consent signed and left in room on computer due to contact/airborne precautions.

## 2021-05-20 NOTE — TOC Initial Note (Signed)
Transition of Care [facility]) - Initial/Assessment Note    Patient Details  Name: Duane Price MRN: [id number] Date of Birth: [birth year]  Transition of Care [facility] CM/SW Contact:    Lockie Pares, RN Phone Number: 05/20/2021, 5:33 PM  Clinical Narrative:                  Day 7 of admission for this patient who was re-admitted post endocarditis post angiovac and vancomycin IV - was on PO linizoid, however was not compliant, and was using IV heroin. Back in admitted with discitis. Patient is not a candidate for home IV antibiotics., as he is a active IV drug user.   Will have to likely need to stay in house to complete IV antibiotics. CM and CSW will follow for continuing needs and transitions   Expected Discharge Plan: Home/Self Care Barriers to Discharge: No Barriers Identified   Patient Goals and CMS Choice        Expected Discharge Plan and Services Expected Discharge Plan: Home/Self Care                                              Prior Living Arrangements/Services     Patient language and need for interpreter reviewed:: Yes        Need for Family Participation in Patient Care: Yes (Comment) Care giver support system in place?: Yes (comment)      Activities of Daily Living Home Assistive Devices/Equipment: None ADL Screening (condition at time of admission) Patient's cognitive ability adequate to safely complete daily activities?: Yes Is the patient deaf or have difficulty hearing?: No Does the patient have difficulty seeing, even when wearing glasses/contacts?: No Does the patient have difficulty concentrating, remembering, or making decisions?: No Patient able to express need for assistance with ADLs?: Yes Does the patient have difficulty dressing or bathing?: No Independently performs ADLs?: Yes (appropriate for developmental age) Does the patient have difficulty walking or climbing stairs?: No Weakness of Legs: None Weakness of Arms/Hands:  None  Permission Sought/Granted                  Emotional Assessment       Orientation: : Oriented to Self, Oriented to Place Alcohol / Substance Use: Not Applicable Psych Involvement: No (comment)  Admission diagnosis:  Osteomyelitis (HCC) [M86.9] Discitis of thoracolumbar region [M46.45] Patient Active Problem List   Diagnosis Date Noted   Osteomyelitis (HCC) 05/13/2021   Discitis, unspecified, thoracolumbar region 05/13/2021   Leukocytosis 05/13/2021   History of bacteremia 05/13/2021   History of pulmonary embolus (PE) 05/13/2021   Homeless 05/13/2021   Polysubstance abuse (HCC) 05/13/2021   MRSA bacteremia 03/29/2021   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 [place] [address] Phone: [phone] Fax: [phone]     Social Determinants of Health (SDOH) Interventions    Readmission Risk Interventions No flowsheet data found.

## 2021-05-20 NOTE — Progress Notes (Signed)
Pharmacy Antibiotic Note  Duane Price is a 35 y.o. male admitted on 05/13/2021 with Lumbar discitis T12-L1.  Pharmacy has been consulted for Vanco dosing.  ID: Lumbar discitis T12-L1 on MRI, hx IVDU with MRSA bacteremia + veg. Asymptomatic COVID. 10d quarantine - ID recs at least 2 weeks IV abx before possible PO transition, likely to keep here given noncompliance   - ID note 8/22 mentions 16 days - Tmax 99.5. WBC 10.9 up.  Vanc 8/19 >> (16d, 9/4)  8/22 VP/VT 39/19, AUC 719 on 1g q8 >> reduce to 1g q12 8/26: VP/VT 3-12, AUC 556 on 1g/12 (Scr up to 1)>>reduce to 750mg /12h.  8/19 COVID + 8/19 BCx: MRSA  8/21 BCx: NGTD  Plan: Decrease Vanco to 750mg  IV q12. Estimated AUC per calculator 417     Weight: 70.4 kg (155 lb 3.3 oz)  Temp (24hrs), Avg:98.6 F (37 C), Min:98.2 F (36.8 C), Max:99.5 F (37.5 C)  Recent Labs  Lab 05/14/21 0040 05/15/21 0607 05/16/21 0845 05/16/21 1112 05/16/21 1631 05/18/21 0556 05/20/21 0139 05/20/21 0923  WBC 11.0* 8.2 7.8  --   --  9.2 10.9*  --   CREATININE 0.83 0.75 0.86  --   --  0.82 1.01  --   VANCOTROUGH  --   --   --   --  19  --   --  12*  VANCOPEAK  --   --   --  39  --   --  30  --      Estimated Creatinine Clearance: 103.6 mL/min (by C-G formula based on SCr of 1.01 mg/dL).    No Known Allergies  Duane Glasner S. 05/22/21, PharmD, BCPS Clinical Staff Pharmacist [email or web address]  05/22/21 05/20/2021 11:16 AM

## 2021-05-20 NOTE — Progress Notes (Addendum)
[facility]    Date of Admission:  05/13/2021   Total days of antibiotics 8           ID: Duane Price Sapia is a 35 y.o. male with recurrent MRSA bacteremia and thoracolumbar discitis Principal Problem:   Discitis, unspecified, thoracolumbar region Active Problems:   Normocytic anemia   IV drug abuse (HCC)   Endocarditis of tricuspid valve   HCV antibody positive   Osteomyelitis (HCC)   Leukocytosis   History of bacteremia   History of pulmonary embolus (PE)   Homeless   Polysubstance abuse (HCC)    Subjective: Afebrile but still having pain to left forearm.  12 point ros is otherwise negative  Medications:   apixaban  5 mg Oral BID   feeding supplement  237 mL Oral BID BM   lidocaine  1 patch Transdermal Q24H   melatonin  3 mg Oral QHS   methadone  25 mg Oral Q12H   nicotine  14 mg Transdermal Daily   pantoprazole  40 mg Oral Daily   polyethylene glycol  17 g Oral BID   senna  2 tablet Oral QHS   sodium chloride flush  3 mL Intravenous Q12H    Objective: Vital signs in last 24 hours: Temp:  [98.2 F (36.8 C)-99.5 F (37.5 C)] 98.2 F (36.8 C) (08/26 0832) Pulse Rate:  [69-80] 69 (08/26 0832) Resp:  [16-18] 18 (08/26 0832) BP: (90-108)/(46-75) 101/60 (08/26 0832) SpO2:  [93 %-97 %] 94 % (08/26 0428) Physical Exam  Constitutional: He is oriented to person, place, and time. He appears well-developed and well-nourished. No distress.  HENT:  Mouth/Throat: Oropharynx is clear and moist. No oropharyngeal exudate.  Cardiovascular: Normal rate, regular rhythm and normal heart sounds. Exam reveals no gallop and no friction rub.  No murmur heard.  Pulmonary/Chest: Effort normal and breath sounds normal. No respiratory distress. He has no wheezes.  Abdominal: Soft. Bowel sounds are normal. He exhibits no distension. There is no tenderness.  Ext: left forearm swelling Neurological: He is alert and oriented to person, place, and time.  Skin:  Skin is warm and dry. No rash noted. No erythema.  Psychiatric: He has a normal mood and affect. His behavior is normal.   Lab Results Recent Labs    05/18/21 0556 05/20/21 0139  WBC 9.2 10.9*  HGB 10.0* 9.8*  HCT 30.6* 29.7*  NA 132* 131*  K 4.4 4.5  CL 95* 92*  CO2 28 30  BUN 19 23*  CREATININE 0.82 1.01   Liver Panel No results for input(s): PROT, ALBUMIN, AST, ALT, ALKPHOS, BILITOT, BILIDIR, IBILI in the last 72 hours. Sedimentation Rate No results for input(s): ESRSEDRATE in the last 72 hours. C-Reactive Protein No results for input(s): CRP in the last 72 hours.  Microbiology: Blood cx NGTD Studies/Results: ECHOCARDIOGRAM COMPLETE  Result Date: 05/20/2021    ECHOCARDIOGRAM REPORT   Patient Name:   Duane Price Lance Date of Exam: 05/20/2021 Medical Rec #:  [id number]     Height:       72.0 in Accession #:    [id number]    Weight:       155.2 lb Date of Birth:  [birth year]     BSA:          1.913 m Patient Age:    35 years      BP:           101/60 mmHg Patient Gender: M  HR:           70 bpm. Exam Location:  Inpatient Procedure: 2D Echo, Cardiac Doppler and Color Doppler Indications:    Endocarditis  History:        Patient has prior history of Echocardiogram examinations, most                 recent 04/25/2021. Endocarditis; Risk Factors:Former Smoker.  Sonographer:    Neomia Dear RDCS Referring Phys: [id number] Dewayne Shorter M GHIMIRE IMPRESSIONS  1. Small, subcentimeter mobile echodensity on tricuspid valve chords, not significantly changed from prior exam. This may represent vegetation given presence on initial echo 03/26/21. The previously noted valvular vegetation from 04/25/21 is no longer clearly seen, though a very small amount of residual vegetation may be present on atrial aspect of valve vs degenerative change. If clinically pertinent to distinguish, can consider TEE.. Tricuspid valve regurgitation is mild to moderate.  2. Left ventricular ejection fraction, by estimation, is 55 to  60%. The left ventricle has normal function. The left ventricle has no regional wall motion abnormalities. Left ventricular diastolic function was not performed.  3. Right ventricular systolic function is normal. The right ventricular size is mildly enlarged. There is normal pulmonary artery systolic pressure. The estimated right ventricular systolic pressure is 32.8 mmHg.  4. The mitral valve is normal in structure. Trivial mitral valve regurgitation. No evidence of mitral stenosis.  5. The aortic valve is tricuspid. Aortic valve regurgitation is not visualized. No aortic stenosis is present.  6. The inferior vena cava is normal in size with greater than 50% respiratory variability, suggesting right atrial pressure of 3 mmHg. Comparison(s): A prior study was performed on 04/25/21. Prior images reviewed side by side. FINDINGS  Left Ventricle: Left ventricular ejection fraction, by estimation, is 55 to 60%. The left ventricle has normal function. The left ventricle has no regional wall motion abnormalities. The left ventricular internal cavity size was normal in size. There is  no left ventricular hypertrophy. Left ventricular diastolic function could not be evaluated. Right Ventricle: The right ventricular size is mildly enlarged. No increase in right ventricular wall thickness. Right ventricular systolic function is normal. There is normal pulmonary artery systolic pressure. The tricuspid regurgitant velocity is 2.73  m/s, and with an assumed right atrial pressure of 3 mmHg, the estimated right ventricular systolic pressure is 32.8 mmHg. Left Atrium: Left atrial size was normal in size. Right Atrium: Right atrial size was normal in size. Pericardium: There is no evidence of pericardial effusion. Mitral Valve: The mitral valve is normal in structure. Trivial mitral valve regurgitation. No evidence of mitral valve stenosis. Tricuspid Valve: Small, subcentimeter mobile echodensity on tricuspid valve chords, not  significantly changed from prior exam. This may represent vegetation given presence on initial echo 03/26/21. The previously noted valvular vegetation from 04/25/21 is no  longer clearly seen, though a very small amount of residual vegetation may be present on atrial aspect of valve vs degenerative change. If clinically pertinent to distinguish, can consider TEE. The tricuspid valve is normal in structure. Tricuspid valve  regurgitation is mild to moderate. No evidence of tricuspid stenosis. Aortic Valve: The aortic valve is tricuspid. Aortic valve regurgitation is not visualized. No aortic stenosis is present. Aortic valve mean gradient measures 2.0 mmHg. Aortic valve peak gradient measures 4.3 mmHg. Aortic valve area, by VTI measures 3.28 cm. Pulmonic Valve: The pulmonic valve was normal in structure. Pulmonic valve regurgitation is trivial. No evidence of pulmonic stenosis. Aorta: The aortic root  is normal in size and structure. Venous: The inferior vena cava is normal in size with greater than 50% respiratory variability, suggesting right atrial pressure of 3 mmHg. IAS/Shunts: No atrial level shunt detected by color flow Doppler.  LEFT VENTRICLE PLAX 2D LVIDd:         4.80 cm LVIDs:         2.80 cm LV PW:         1.00 cm LV IVS:        1.00 cm LVOT diam:     2.40 cm LV SV:         63 LV SV Index:   33 LVOT Area:     4.52 cm  LV Volumes (MOD) LV vol d, MOD A2C: 79.8 ml LV vol d, MOD A4C: 116.0 ml LV vol s, MOD A2C: 28.7 ml LV vol s, MOD A4C: 48.1 ml LV SV MOD A2C:     51.1 ml LV SV MOD A4C:     116.0 ml LV SV MOD BP:      60.4 ml RIGHT VENTRICLE RV Basal diam:  4.10 cm RV Mid diam:    3.60 cm RV S prime:     12.20 cm/s TAPSE (M-mode): 2.6 cm LEFT ATRIUM             Index       RIGHT ATRIUM           Index LA diam:        2.80 cm 1.46 cm/m  RA Area:     11.80 cm LA Vol (A2C):   65.2 ml 34.08 ml/m RA Volume:   23.10 ml  12.08 ml/m LA Vol (A4C):   46.2 ml 24.15 ml/m LA Biplane Vol: 56.9 ml 29.75 ml/m  AORTIC  VALVE                   PULMONIC VALVE AV Area (Vmax):    3.48 cm    PV Vmax:       0.71 m/s AV Area (Vmean):   3.13 cm    PV Vmean:      48.300 cm/s AV Area (VTI):     3.28 cm    PV VTI:        0.116 m AV Vmax:           104.00 cm/s PV Peak grad:  2.0 mmHg AV Vmean:          71.300 cm/s PV Mean grad:  1.0 mmHg AV VTI:            0.193 m AV Peak Grad:      4.3 mmHg AV Mean Grad:      2.0 mmHg LVOT Vmax:         80.10 cm/s LVOT Vmean:        49.300 cm/s LVOT VTI:          0.140 m LVOT/AV VTI ratio: 0.73  AORTA Ao Asc diam: 2.70 cm TRICUSPID VALVE TR Peak grad:   29.8 mmHg TR Vmax:        273.00 cm/s  SHUNTS Systemic VTI:  0.14 m Systemic Diam: 2.40 cm Weston Brass MD Electronically signed by Weston Brass MD Signature Date/Time: 05/20/2021/11:50:53 AM    Final    [place] EKG SITE RITE  Result Date: 05/19/2021 If Site Rite image not attached, placement could not be confirmed due to current cardiac rhythm.    Assessment/Plan: MRSA bacteremia = awaiting picc line placement. Currently no piv  access. If unable to get picc line placed today, he will need linezolid until picc line is placed.TTE is pending  Left forearm swelling = please get U/S to rule out abscess  MRSA discitis = continue on vancomycin once picc line is established. Length of therapy is tricky since patient has not been compliant with oral meds (off of linezolid x 5-6days just prior to admit). Will readdress length of therapy next week.  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 Does not appear having excess pain or withdrawal.  Covid-19 = has 3 more days of isolation and can finish 10 day isolation  [facility] Infect[facility] Diseases Cell: [phone] Pager: [phone]  05/20/2021, 12:21 PM

## 2021-05-20 NOTE — Progress Notes (Signed)
*  PRELIMINARY RESULTS* Echocardiogram 2D Echocardiogram has been performed.  Neomia Dear RDCS 05/20/2021, 10:48 AM

## 2021-05-21 NOTE — Plan of Care (Signed)
  Problem: Coping: Goal: Psychosocial and spiritual needs will be supported Outcome: Progressing   Problem: Respiratory: Goal: Will maintain a patent airway Outcome: Progressing   

## 2021-05-21 NOTE — Plan of Care (Signed)
  Problem: Education: Goal: Knowledge of risk factors and measures for prevention of condition will improve Outcome: Progressing   Problem: Coping: Goal: Psychosocial and spiritual needs will be supported Outcome: Progressing   

## 2021-05-21 NOTE — Progress Notes (Signed)
PROGRESS NOTE        PATIENT DETAILS Name: Duane Price Age: 35 y.o. Sex: male Date of Birth: 1985/09/29 Admit Date: 05/13/2021 Admitting Physician Clydie Braun, MD ZOX:[id number], No Pcp Per (Inactive)  Brief Narrative: Patient is a 35 y.o. 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  Acknowledges noncompliance to medications (claims Zyvox got lost) and ongoing IV heroin use.  Significant events: 7/1-8/1>> hospitalization for MRSA bacteremia with septic pulmonary emboli and tricuspid valve endocarditis-s/p angio vac debridement.  Discharged on oral linezolid 8/19>> admit for worsening back pain-found to have T12-L1 discitis.    Significant studies: 8/19>> MRI thoracolumbar spine: Early discitis/osteomyelitis T12-L1 8/26>> small mobile density on TV-not significantly changed from prior exam. 8/26>> left forearm ultrasound: No abscess.  Antimicrobial therapy: Vancomycin: 8/19>>  Microbiology data: 8/19>> COVID PCR: Positive 8/19>> blood culture : MRSA  8/21>> blood culture: No growth  Procedures : None  Consults: Infectious disease  DVT Prophylaxis : apixaban (ELIQUIS) tablet 5 mg   Subjective: Left proximal forearm area less tender and less indurated today.   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  Repeat echo with small-but unchanged vegetation from prior. Back pain well controlled with methadone and as needed oxycodone.  Patient aware that when we get closer to discharge-he will need to call the  methadone clinic and get himself established.  ID following-we will await recommendations regarding duration of antimicrobial therapy.  Small area of induration and proximal left forearm: Probably due to recent IV-no signs of thrombophlebitis.  Exam is benign.  Ultrasound negative for abscess.  Continue supportive care.    COVID-19 infection: Clearly asymptomatic-maintain on isolation for 10 days (can come off isolation on 8/30).  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  Will need social work counseling when closer to discharge.  GERD: PPI  Homelessness  Diet: Diet Order             Diet regular Room service appropriate? Yes; Fluid consistency: Thin  Diet effective now                    Code Status: Full code   Family Communication: None at bedside  Disposition Plan: Status is: Inpatient  Remains inpatient appropriate because:Inpatient level of care appropriate due to severity of illness  Dispo: The patient is from: Home              Anticipated d/c is to: Home              Patient currently is not medically stable to d/c.   Difficult to place patient No   Barriers to Discharge: MRSA bacteremia-not a candidate for outpatient IV antimicrobial therapy.  Needs to remain inpatient until he clears the bacteremia.  Antimicrobial agents: Anti-infectives (From admission, onward)  Start     Dose/Rate Route Frequency Ordered Stop   05/20/21 1215  vancomycin (VANCOREADY) IVPB 750 mg/150 mL        750 mg 150 mL/hr over 60 Minutes Intravenous Every 12 hours 05/20/21 1115     05/16/21 2200  vancomycin (VANCOCIN) IVPB 1000 mg/200 mL premix  Status:  Discontinued        1,000 mg 200 mL/hr over 60 Minutes Intravenous Every 12 hours  05/16/21 1826 05/20/21 1113   05/13/21 1600  vancomycin (VANCOCIN) IVPB 1000 mg/200 mL premix  Status:  Discontinued        1,000 mg 200 mL/hr over 60 Minutes Intravenous Every 8 hours 05/13/21 0818 05/16/21 1301   05/13/21 0715  vancomycin (VANCOREADY) IVPB 1250 mg/250 mL        1,250 mg 166.7 mL/hr over 90 Minutes Intravenous  Once 05/13/21 0655 05/13/21 0925        Time spent: 15 minutes-Greater than 50% of this time was spent in counseling, explanation of diagnosis, planning of further management, and coordination of care.  MEDICATIONS: Scheduled Meds:  apixaban  5 mg Oral BID   feeding supplement  237 mL Oral BID BM   lidocaine  1 patch Transdermal Q24H   melatonin  3 mg Oral QHS   methadone  25 mg Oral Q12H   nicotine  14 mg Transdermal Daily   pantoprazole  40 mg Oral Daily   polyethylene glycol  17 g Oral BID   senna  2 tablet Oral QHS   sodium chloride flush  3 mL Intravenous Q12H   Continuous Infusions:  vancomycin 750 mg (05/21/21 1228)   PRN Meds:.acetaminophen **OR** acetaminophen, albuterol, bisacodyl, hydrOXYzine, naLOXone (NARCAN)  injection, ondansetron **OR** ondansetron (ZOFRAN) IV, oxyCODONE, sodium phosphate   PHYSICAL EXAM: Vital signs: Vitals:   05/20/21 2350 05/21/21 0332 05/21/21 0835 05/21/21 1158  BP: (!) 95/50 (!) 103/59 (!) 105/56 (!) 108/59  Pulse: 78 72 84 70  Resp: 17 18 18 18   Temp: 97.8 F (36.6 C) 97.8 F (36.6 C) 98 F (36.7 C) 98.3 F (36.8 C)  TempSrc: Axillary Oral Oral Oral  SpO2: 96% 97% 96%   Weight:       Filed Weights   05/13/21 0700 05/14/21 0400  Weight: 70.9 kg 70.4 kg   Body mass index is 21.05 kg/m.   Gen Exam:Alert awake-not in any distress HEENT:atraumatic, normocephalic Chest: B/L clear to auscultation anteriorly CVS:S1S2 regular Abdomen:soft non tender, non distended Extremities:no edema Neurology: Non focal Skin: no rash   I have personally reviewed following labs and imaging studies  LABORATORY  DATA: CBC: Recent Labs  Lab 05/15/21 0607 05/16/21 0845 05/18/21 0556 05/20/21 0139  WBC 8.2 7.8 9.2 10.9*  HGB 10.1* 9.8* 10.0* 9.8*  HCT 30.7* 30.1* 30.6* 29.7*  MCV 87.2 86.2 86.2 85.3  PLT 130* 334 394 397     Basic Metabolic Panel: Recent Labs  Lab 05/15/21 0607 05/16/21 0845 05/18/21 0556 05/20/21 0139  NA 137 136 132* 131*  K 3.8 3.9 4.4 4.5  CL 102 97* 95* 92*  CO2 27 28 28 30   GLUCOSE 109* 89 95 97  BUN 5* 6 19 23*  CREATININE 0.75 0.86 0.82 1.01  CALCIUM 9.1 8.7* 9.6 9.4     GFR: Estimated Creatinine Clearance: 103.6 mL/min (by C-G formula based on SCr of 1.01 mg/dL).  Liver Function Tests: No results for input(s): AST, ALT, ALKPHOS, BILITOT, PROT, ALBUMIN in the last 168 hours.  No results for input(s):  LIPASE, AMYLASE in the last 168 hours. No results for input(s): AMMONIA in the last 168 hours.  Coagulation Profile: No results for input(s): INR, PROTIME in the last 168 hours.  Cardiac Enzymes: No results for input(s): CKTOTAL, CKMB, CKMBINDEX, TROPONINI in the last 168 hours.  BNP (last 3 results) No results for input(s): PROBNP in the last 8760 hours.  Lipid Profile: No results for input(s): CHOL, HDL, LDLCALC, TRIG, CHOLHDL, LDLDIRECT in the last 72 hours.  Thyroid Function Tests: No results for input(s): TSH, T4TOTAL, FREET4, T3FREE, THYROIDAB in the last 72 hours.  Anemia Panel: No results for input(s): VITAMINB12, FOLATE, FERRITIN, TIBC, IRON, RETICCTPCT in the last 72 hours.  Urine analysis:    Component Value Date/Time   COLORURINE YELLOW 04/19/2021 1546   APPEARANCEUR HAZY (A) 04/19/2021 1546   LABSPEC 1.021 04/19/2021 1546   PHURINE 5.0 04/19/2021 1546   GLUCOSEU NEGATIVE 04/19/2021 1546   HGBUR LARGE (A) 04/19/2021 1546   BILIRUBINUR NEGATIVE 04/19/2021 1546   KETONESUR NEGATIVE 04/19/2021 1546   PROTEINUR 30 (A) 04/19/2021 1546   UROBILINOGEN 1.0 02/09/2010 2121   NITRITE NEGATIVE 04/19/2021 1546   LEUKOCYTESUR  NEGATIVE 04/19/2021 1546    Sepsis Labs: Lactic Acid, Venous    Component Value Date/Time   LATICACIDVEN 0.8 05/13/2021 0310    MICROBIOLOGY: Recent Results (from the past 240 hour(s))  Blood culture (routine x 2)     Status: Abnormal   Collection Time: 05/13/21  3:08 AM   Specimen: BLOOD RIGHT HAND  Result Value Ref Range Status   Specimen Description BLOOD RIGHT HAND  Final   Special Requests   Final    BOTTLES DRAWN AEROBIC AND ANAEROBIC Blood Culture adequate volume   Culture  Setup Time   Final    GRAM POSITIVE COCCI IN CLUSTERS IN BOTH AEROBIC AND ANAEROBIC BOTTLES CRITICAL RESULT CALLED TO, READ BACK BY AND VERIFIED WITHArnaldo Natal: J [facility]NEY [facility] 2116 05/13/21 A BROWNING Performed at [facility], [address] [place], [place] [id number]    Culture METHICILLIN RESISTANT STAPHYLOCOCCUS AUREUS (A)  Final   Report Status 05/15/2021 FINAL  Final   Organism ID, Bacteria METHICILLIN RESISTANT STAPHYLOCOCCUS AUREUS  Final      Susceptibility   Methicillin resistant staphylococcus aureus - MIC*    CIPROFLOXACIN >=8 RESISTANT Resistant     ERYTHROMYCIN >=8 RESISTANT Resistant     GENTAMICIN <=0.5 SENSITIVE Sensitive     OXACILLIN >=4 RESISTANT Resistant     TETRACYCLINE <=1 SENSITIVE Sensitive     VANCOMYCIN <=0.5 SENSITIVE Sensitive     TRIMETH/SULFA <=10 SENSITIVE Sensitive     CLINDAMYCIN <=0.25 SENSITIVE Sensitive     RIFAMPIN <=0.5 SENSITIVE Sensitive     Inducible Clindamycin NEGATIVE Sensitive     * METHICILLIN RESISTANT STAPHYLOCOCCUS AUREUS  Blood Culture ID Panel (Reflexed)     Status: Abnormal   Collection Time: 05/13/21  3:08 AM  Result Value Ref Range Status   Enterococcus faecalis NOT DETECTED NOT DETECTED Final   Enterococcus Faecium NOT DETECTED NOT DETECTED Final   Listeria monocytogenes NOT DETECTED NOT DETECTED Final   Staphylococcus species DETECTED (A) NOT DETECTED Final    Comment: CRITICAL RESULT CALLED TO, READ BACK BY AND VERIFIED WITH: Arnaldo NatalJ CARNEY  PHARMD 2116 05/13/21 A BROWNING    Staphylococcus aureus (BCID) DETECTED (A) NOT DETECTED Final    Comment: Methicillin (oxacillin)-resistant Staphylococcus aureus (MRSA). MRSA is predictably resistant to beta-lactam antibiotics (except ceftaroline). Preferred therapy is vancomycin unless clinically contraindicated. Patient requires contact  precautions if  hospitalized. CRITICAL RESULT CALLED TO, READ BACK BY AND VERIFIED WITH: Arnaldo Natal PHARMD 2116 05/13/21 A BROWNING    Staphylococcus epidermidis NOT DETECTED NOT DETECTED Final   Staphylococcus lugdunensis NOT DETECTED NOT DETECTED Final   Streptococcus species NOT DETECTED NOT DETECTED Final   Streptococcus agalactiae NOT DETECTED NOT DETECTED Final   Streptococcus pneumoniae NOT DETECTED NOT DETECTED Final   Streptococcus pyogenes NOT DETECTED NOT DETECTED Final   A.calcoaceticus-baumannii NOT DETECTED NOT DETECTED Final   Bacteroides fragilis NOT DETECTED NOT DETECTED Final   Enterobacterales NOT DETECTED NOT DETECTED Final   Enterobacter cloacae complex NOT DETECTED NOT DETECTED Final   Escherichia coli NOT DETECTED NOT DETECTED Final   Klebsiella aerogenes NOT DETECTED NOT DETECTED Final   Klebsiella oxytoca NOT DETECTED NOT DETECTED Final   Klebsiella pneumoniae NOT DETECTED NOT DETECTED Final   Proteus species NOT DETECTED NOT DETECTED Final   Salmonella species NOT DETECTED NOT DETECTED Final   Serratia marcescens NOT DETECTED NOT DETECTED Final   Haemophilus influenzae NOT DETECTED NOT DETECTED Final   Neisseria meningitidis NOT DETECTED NOT DETECTED Final   Pseudomonas aeruginosa NOT DETECTED NOT DETECTED Final   Stenotrophomonas maltophilia NOT DETECTED NOT DETECTED Final   Candida albicans NOT DETECTED NOT DETECTED Final   Candida auris NOT DETECTED NOT DETECTED Final   Candida glabrata NOT DETECTED NOT DETECTED Final   Candida krusei NOT DETECTED NOT DETECTED Final   Candida parapsilosis NOT DETECTED NOT DETECTED Final    Candida tropicalis NOT DETECTED NOT DETECTED Final   Cryptococcus neoformans/gattii NOT DETECTED NOT DETECTED Final   Meth resistant mecA/C and MREJ DETECTED (A) NOT DETECTED Final    Comment: CRITICAL RESULT CALLED TO, READ BACK BY AND VERIFIED WITHArnaldo Natal [facility] 2116 05/13/21 A BROWNING Performed at [facility], [address] [place], [place] [address]   Blood culture (routine x 2)     Status: Abnormal   Collection Time: 05/13/21  3:09 AM   Specimen: BLOOD LEFT HAND  Result Value Ref Range Status   Specimen Description BLOOD LEFT HAND  Final   Special Requests   Final    BOTTLES DRAWN AEROBIC AND ANAEROBIC Blood Culture adequate volume   Culture  Setup Time   Final    GRAM POSITIVE COCCI IN CLUSTERS AEROBIC BOTTLE ONLY CRITICAL VALUE NOTED.  VALUE IS CONSISTENT WITH PREVIOUSLY REPORTED AND CALLED VALUE.    Culture (A)  Final    STAPHYLOCOCCUS AUREUS SUSCEPTIBILITIES PERFORMED ON PREVIOUS CULTURE WITHIN THE LAST 5 DAYS. Performed at [facility], [address], [place], [place] [address]    Report Status 05/16/2021 FINAL  Final  Resp Panel by RT-PCR (Flu A&B, Covid) Nasopharyngeal Swab     Status: Abnormal   Collection Time: 05/13/21  8:07 AM   Specimen: Nasopharyngeal Swab; Nasopharyngeal(NP) swabs in vial transport medium  Result Value Ref Range Status   SARS Coronavirus 2 by RT PCR POSITIVE (A) NEGATIVE Final    Comment: RESULT CALLED TO, READ BACK BY AND VERIFIED WITH: RN Cherlyn Labella [id number] AT 1016 BY CM (NOTE) SARS-CoV-2 target nucleic acids are DETECTED.  The SARS-CoV-2 RNA is generally detectable in upper respiratory specimens during the acute phase of infection. Positive results are indicative of the presence of the identified virus, but do not rule out bacterial infection or co-infection with other pathogens not detected by the test. Clinical correlation with patient history and other diagnostic information is necessary to determine  patient infection  status. The expected result is Negative.  Fact Sheet for Patients: BloggerCourse.com  Fact Sheet for Healthcare Providers: SeriousBroker.it  This test is not yet approved or cleared by the [place] FDA and  has been authorized for detection and/or diagnosis of SARS-CoV-2 by FDA under an Emergency Use Authorization (EUA).  This EUA will remain in effect (meaning this test can b e used) for the duration of  the COVID-19 declaration under Section 564(b)(1) of the Act, 21 U.S.C. section 360bbb-3(b)(1), unless the authorization is terminated or revoked sooner.     Influenza A by PCR NEGATIVE NEGATIVE Final   Influenza B by PCR NEGATIVE NEGATIVE Final    Comment: (NOTE) The Xpert Xpress SARS-CoV-2/FLU/RSV plus assay is intended as an aid in the diagnosis of influenza from Nasopharyngeal swab specimens and should not be used as a sole basis for treatment. Nasal washings and aspirates are unacceptable for Xpert Xpress SARS-CoV-2/FLU/RSV testing.  Fact Sheet for Patients: BloggerCourse.com  Fact Sheet for Healthcare Providers: SeriousBroker.it  This test is not yet approved or cleared by the [place] FDA and has been authorized for detection and/or diagnosis of SARS-CoV-2 by FDA under an Emergency Use Authorization (EUA). This EUA will remain in effect (meaning this test can be used) for the duration of the COVID-19 declaration under Section 564(b)(1) of the Act, 21 U.S.C. section 360bbb-3(b)(1), unless the authorization is terminated or revoked.  Performed at [facility], [address] [place], [place] [address]   Culture, blood (routine x 2)     Status: None   Collection Time: 05/15/21  6:07 AM   Specimen: BLOOD  Result Value Ref Range Status   Specimen Description BLOOD BLOOD LEFT HAND  Final   Special Requests   Final    AEROBIC BOTTLE  ONLY Blood Culture results may not be optimal due to an inadequate volume of blood received in culture bottles   Culture   Final    NO GROWTH 5 DAYS Performed at [facility], [address] [place], [place] [address]    Report Status 05/20/2021 FINAL  Final  Culture, blood (routine x 2)     Status: None   Collection Time: 05/15/21  6:07 AM   Specimen: BLOOD  Result Value Ref Range Status   Specimen Description BLOOD BLOOD RIGHT HAND  Final   Special Requests   Final    AEROBIC BOTTLE ONLY Blood Culture results may not be optimal due to an inadequate volume of blood received in culture bottles   Culture   Final    NO GROWTH 5 DAYS Performed at [facility], [address], [place], [place] [address]    Report Status 05/20/2021 FINAL  Final    RADIOLOGY STUDIES/RESULTS: ECHOCARDIOGRAM COMPLETE  Result Date: 05/20/2021    ECHOCARDIOGRAM REPORT   Patient Name:   KENTRELL HALLAHAN Date of Exam: 05/20/2021 Medical Rec #:  [id number]     Height:       72.0 in Accession #:    [id number]    Weight:       155.2 lb Date of Birth:  1986/02/25     BSA:          1.913 m Patient Age:    35 years      BP:           101/60 mmHg Patient Gender: M             HR:  70 bpm. Exam Location:  Inpatient Procedure: 2D Echo, Cardiac Doppler and Color Doppler Indications:    Endocarditis  History:        Patient has prior history of Echocardiogram examinations, most                 recent 04/25/2021. Endocarditis; Risk Factors:Former Smoker.  Sonographer:    Neomia Dear RDCS Referring Phys: [id number] Dewayne Shorter M [place] IMPRESSIONS  1. Small, subcentimeter mobile echodensity on tricuspid valve chords, not significantly changed from prior exam. This may represent vegetation given presence on initial echo 03/26/21. The previously noted valvular vegetation from 04/25/21 is no longer clearly seen, though a very small amount of residual vegetation may be present on atrial aspect of valve vs degenerative change. If  clinically pertinent to distinguish, can consider TEE.. Tricuspid valve regurgitation is mild to moderate.  2. Left ventricular ejection fraction, by estimation, is 55 to 60%. The left ventricle has normal function. The left ventricle has no regional wall motion abnormalities. Left ventricular diastolic function was not performed.  3. Right ventricular systolic function is normal. The right ventricular size is mildly enlarged. There is normal pulmonary artery systolic pressure. The estimated right ventricular systolic pressure is 32.8 mmHg.  4. The mitral valve is normal in structure. Trivial mitral valve regurgitation. No evidence of mitral stenosis.  5. The aortic valve is tricuspid. Aortic valve regurgitation is not visualized. No aortic stenosis is present.  6. The inferior vena cava is normal in size with greater than 50% respiratory variability, suggesting right atrial pressure of 3 mmHg. Comparison(s): A prior study was performed on 04/25/21. Prior images reviewed side by side. FINDINGS  Left Ventricle: Left ventricular ejection fraction, by estimation, is 55 to 60%. The left ventricle has normal function. The left ventricle has no regional wall motion abnormalities. The left ventricular internal cavity size was normal in size. There is  no left ventricular hypertrophy. Left ventricular diastolic function could not be evaluated. Right Ventricle: The right ventricular size is mildly enlarged. No increase in right ventricular wall thickness. Right ventricular systolic function is normal. There is normal pulmonary artery systolic pressure. The tricuspid regurgitant velocity is 2.73  m/s, and with an assumed right atrial pressure of 3 mmHg, the estimated right ventricular systolic pressure is 32.8 mmHg. Left Atrium: Left atrial size was normal in size. Right Atrium: Right atrial size was normal in size. Pericardium: There is no evidence of pericardial effusion. Mitral Valve: The mitral valve is normal in structure.  Trivial mitral valve regurgitation. No evidence of mitral valve stenosis. Tricuspid Valve: Small, subcentimeter mobile echodensity on tricuspid valve chords, not significantly changed from prior exam. This may represent vegetation given presence on initial echo 03/26/21. The previously noted valvular vegetation from 04/25/21 is no  longer clearly seen, though a very small amount of residual vegetation may be present on atrial aspect of valve vs degenerative change. If clinically pertinent to distinguish, can consider TEE. The tricuspid valve is normal in structure. Tricuspid valve  regurgitation is mild to moderate. No evidence of tricuspid stenosis. Aortic Valve: The aortic valve is tricuspid. Aortic valve regurgitation is not visualized. No aortic stenosis is present. Aortic valve mean gradient measures 2.0 mmHg. Aortic valve peak gradient measures 4.3 mmHg. Aortic valve area, by VTI measures 3.28 cm. Pulmonic Valve: The pulmonic valve was normal in structure. Pulmonic valve regurgitation is trivial. No evidence of pulmonic stenosis. Aorta: The aortic root is normal in size and structure. Venous: The inferior vena cava  is normal in size with greater than 50% respiratory variability, suggesting right atrial pressure of 3 mmHg. IAS/Shunts: No atrial level shunt detected by color flow Doppler.  LEFT VENTRICLE PLAX 2D LVIDd:         4.80 cm LVIDs:         2.80 cm LV PW:         1.00 cm LV IVS:        1.00 cm LVOT diam:     2.40 cm LV SV:         63 LV SV Index:   33 LVOT Area:     4.52 cm  LV Volumes (MOD) LV vol d, MOD A2C: 79.8 ml LV vol d, MOD A4C: 116.0 ml LV vol s, MOD A2C: 28.7 ml LV vol s, MOD A4C: 48.1 ml LV SV MOD A2C:     51.1 ml LV SV MOD A4C:     116.0 ml LV SV MOD BP:      60.4 ml RIGHT VENTRICLE RV Basal diam:  4.10 cm RV Mid diam:    3.60 cm RV S prime:     12.20 cm/s TAPSE (M-mode): 2.6 cm LEFT ATRIUM             Index       RIGHT ATRIUM           Index LA diam:        2.80 cm 1.46 cm/m  RA Area:      11.80 cm LA Vol (A2C):   65.2 ml 34.08 ml/m RA Volume:   23.10 ml  12.08 ml/m LA Vol (A4C):   46.2 ml 24.15 ml/m LA Biplane Vol: 56.9 ml 29.75 ml/m  AORTIC VALVE                   PULMONIC VALVE AV Area (Vmax):    3.48 cm    PV Vmax:       0.71 m/s AV Area (Vmean):   3.13 cm    PV Vmean:      48.300 cm/s AV Area (VTI):     3.28 cm    PV VTI:        0.116 m AV Vmax:           104.00 cm/s PV Peak grad:  2.0 mmHg AV Vmean:          71.300 cm/s PV Mean grad:  1.0 mmHg AV VTI:            0.193 m AV Peak Grad:      4.3 mmHg AV Mean Grad:      2.0 mmHg LVOT Vmax:         80.10 cm/s LVOT Vmean:        49.300 cm/s LVOT VTI:          0.140 m LVOT/AV VTI ratio: 0.73  AORTA Ao Asc diam: 2.70 cm TRICUSPID VALVE TR Peak grad:   29.8 mmHg TR Vmax:        273.00 cm/s  SHUNTS Systemic VTI:  0.14 m Systemic Diam: 2.40 cm Weston Brass MD Electronically signed by Weston Brass MD Signature Date/Time: 05/20/2021/11:50:53 AM    Final    [place] LT UPPER EXTREM LTD SOFT TISSUE NON VASCULAR  Result Date: 05/20/2021 CLINICAL DATA:  Left volar forearm swelling for the past week. EXAM: ULTRASOUND LEFT UPPER EXTREMITY LIMITED TECHNIQUE: Ultrasound examination of the upper extremity soft tissues was performed in the area of clinical concern. COMPARISON:  None. FINDINGS: Focused ultrasound of the volar left forearm demonstrates no discrete soft tissue mass or fluid collection. IMPRESSION: 1. Negative. No soft tissue mass or fluid collection. Electronically Signed   By: Obie Dredge M.D.   On: 05/20/2021 21:44     LOS: 8 days   Jeoffrey Massed, MD  [facility]    To contact the attending provider between 7A-7P or the covering provider during after hours 7P-7A, please log into the web site [email or web address] and access using universal [place] password for that web site. If you do not have the password, please call the hospital operator.  05/21/2021, 3:27 PM

## 2021-05-22 LAB — BASIC METABOLIC PANEL
Anion gap: 7 (ref 5–15)
BUN: 24 mg/dL — ABNORMAL HIGH (ref 6–20)
CO2: 28 mmol/L (ref 22–32)
Calcium: 9.3 mg/dL (ref 8.9–10.3)
Chloride: 98 mmol/L (ref 98–111)
Creatinine, Ser: 0.8 mg/dL (ref 0.61–1.24)
GFR, Estimated: >60 mL/min (ref >60)
Glucose, Bld: 121 mg/dL — ABNORMAL HIGH (ref 70–99)
Potassium: 4.1 mmol/L (ref 3.5–5.1)
Sodium: 133 mmol/L — ABNORMAL LOW (ref 135–145)

## 2021-05-22 LAB — CBC
HCT: 29.2 % — ABNORMAL LOW (ref 39.0–52.0)
Hemoglobin: 9.4 g/dL — ABNORMAL LOW (ref 13.0–17.0)
MCH: 27.8 pg (ref 26.0–34.0)
MCHC: 32.2 g/dL (ref 30.0–36.0)
MCV: 86.4 fL (ref 80.0–100.0)
Platelets: 376 10*3/uL (ref 150–400)
RBC: 3.38 MIL/uL — ABNORMAL LOW (ref 4.22–5.81)
RDW: 14.5 % (ref 11.5–15.5)
WBC: 10.1 10*3/uL (ref 4.0–10.5)
nRBC: 0 % (ref 0.0–0.2)

## 2021-05-22 MED ORDER — CHLORHEXIDINE GLUCONATE CLOTH 2 % EX PADS
6.0000 | MEDICATED_PAD | Freq: Every day | CUTANEOUS | Status: DC
  Administered 2021-05-22 – 2021-05-30 (×9): 6 via TOPICAL

## 2021-05-22 MED ORDER — SODIUM CHLORIDE 0.9% FLUSH
10.0000 mL | INTRAVENOUS | Status: DC | PRN
  Administered 2021-05-28: 10 mL

## 2021-05-22 NOTE — Progress Notes (Signed)
PROGRESS NOTE        PATIENT DETAILS Name: Duane Price Age: 35 y.o. Sex: male Date of Birth: 03-28-1986 Admit Date: 05/13/2021 Admitting Physician Clydie Braunondell A Smith, MD ZOX:[id number]:Patient, No Pcp Per (Inactive)  Brief Narrative: Patient is a 35 y.o. 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  Acknowledges noncompliance to medications (claims Zyvox got lost) and ongoing IV heroin use.  Significant events: 7/1-8/1>> hospitalization for MRSA bacteremia with septic pulmonary emboli and tricuspid valve endocarditis-s/p angio vac debridement.  Discharged on oral linezolid 8/19>> admit for worsening back pain-found to have T12-L1 discitis.    Significant studies: 8/19>> MRI thoracolumbar spine: Early discitis/osteomyelitis T12-L1 8/26>> small mobile density on TV-not significantly changed from prior exam. 8/26>> left forearm ultrasound: No abscess.  Antimicrobial therapy: Vancomycin: 8/19>>  Microbiology data: 8/19>> COVID PCR: Positive 8/19>> blood culture : MRSA  8/21>> blood culture: No growth  Procedures : None  Consults: Infectious disease  DVT Prophylaxis : apixaban (ELIQUIS) tablet 5 mg   Subjective: Patient denies any complaints this morning.  No nausea vomiting.  Wondering if he still has COVID-19.  Asking for repeat test.  Patient was told that we usually do not repeat test for at least 90 days.   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  Repeat echo with small-but unchanged vegetation from prior. Back pain  well controlled with methadone and as needed oxycodone.   Patient aware that when we get closer to discharge-he will need to call the methadone clinic and get himself established.   ID following-we will await recommendations regarding duration of antimicrobial therapy.  Small area of induration and proximal left forearm: Probably due to recent IV-no signs of thrombophlebitis.  Ultrasound negative for abscess.  Continue supportive care.    COVID-19 infection: Remains asymptomatic.  Tested positive on 8/19.  Can discontinue airborne isolation on 8/30.  Was not thought to require any COVID-19 related treatments.  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  Will need social work counseling when closer to discharge.  Hyponatremia Sodium level noted to be low since July.  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  Stable.    GERD: PPI  Homelessness  Diet: Diet Order             Diet regular Room service appropriate? Yes; Fluid consistency: Thin  Diet effective now                    Code Status: Full code   Family Communication: None at bedside  Disposition Plan: Status is: Inpatient  Remains inpatient appropriate because:Inpatient level of care appropriate due to severity of illness  Dispo: The patient is from: Home              Anticipated d/c is to: Home              Patient currently is not medically stable to d/c.  Difficult to place patient No   Barriers to Discharge: MRSA bacteremia-not a candidate for outpatient IV antimicrobial therapy.  Needs to remain inpatient until he clears the bacteremia.  Antimicrobial agents: Anti-infectives (From admission, onward)    Start     Dose/Rate Route Frequency Ordered Stop   05/20/21 1215  vancomycin (VANCOREADY) IVPB 750 mg/150 mL        750 mg 150 mL/hr over 60 Minutes Intravenous Every 12 hours  05/20/21 1115     05/16/21 2200  vancomycin (VANCOCIN) IVPB 1000 mg/200 mL premix  Status:  Discontinued        1,000 mg 200 mL/hr over 60 Minutes Intravenous Every 12 hours 05/16/21 1826 05/20/21 1113   05/13/21 1600  vancomycin (VANCOCIN) IVPB 1000 mg/200 mL premix  Status:  Discontinued        1,000 mg 200 mL/hr over 60 Minutes Intravenous Every 8 hours 05/13/21 0818 05/16/21 1301   05/13/21 0715  vancomycin (VANCOREADY) IVPB 1250 mg/250 mL        1,250 mg 166.7 mL/hr over 90 Minutes Intravenous  Once 05/13/21 0655 05/13/21 0925          MEDICATIONS: Scheduled Meds:  apixaban  5 mg Oral BID   Chlorhexidine Gluconate Cloth  6 each Topical Daily   feeding supplement  237 mL Oral BID BM   lidocaine  1 patch Transdermal Q24H   melatonin  3 mg Oral QHS   methadone  25 mg Oral Q12H   nicotine  14 mg Transdermal Daily   pantoprazole  40 mg Oral Daily   polyethylene glycol  17 g Oral BID   senna  2 tablet Oral QHS   sodium chloride flush  3 mL Intravenous Q12H   Continuous Infusions:  vancomycin 750 mg (05/21/21 2322)   PRN Meds:.acetaminophen **OR** acetaminophen, albuterol, bisacodyl, hydrOXYzine, naLOXone (NARCAN)  injection, ondansetron **OR** ondansetron (ZOFRAN) IV, oxyCODONE, sodium chloride flush, sodium phosphate   PHYSICAL EXAM: Vital signs: Vitals:   05/21/21 2059 05/22/21 0011 05/22/21 0318 05/22/21 0757  BP: 112/60 (!) 114/56 (!) 107/59 101/64  Pulse: 76 73 73 70  Resp: Temp: 98.7 F (37.1 C) 98.8 F (37.1 C) 98.7 F (37.1 C) 98.1 F (36.7 C)  TempSrc: Oral Oral Oral Oral  SpO2: 95% 96% 94%   Weight:       Filed Weights   05/13/21 0700 05/14/21 0400  Weight: 70.9 kg 70.4 kg   Body mass index is 21.05 kg/m.    General appearance: Awake alert.  In no distress Resp: Clear to auscultation bilaterally.  Normal effort Cardio: S1-S2 is normal regular.  No S3-S4.  No rubs murmurs or bruit GI: Abdomen is soft.  Nontender nondistended.  Bowel  sounds are present normal.  No masses organomegaly Extremities: No edema.  Full range of motion of lower extremities. Neurologic: Alert and oriented x3.  No focal neurological deficits.     I have personally reviewed following labs and imaging studies  LABORATORY DATA: CBC: Recent Labs  Lab 05/16/21 0845 05/18/21 0556 05/20/21 0139 05/22/21 0544  WBC 7.8 9.2 10.9* 10.1  HGB 9.8* 10.0* 9.8* 9.4*  HCT 30.1* 30.6* 29.7* 29.2*  MCV 86.2 86.2 85.3 86.4  PLT 334 394 397 376     Basic Metabolic Panel: Recent Labs  Lab 05/16/21 0845 05/18/21 0556 05/20/21 0139 05/22/21 0544  NA 136 132* 131* 133*  K 3.9 4.4 4.5 4.1  CL 97* 95* 92* 98  CO2 28 28  30 28  GLUCOSE 89 95 97 121*  BUN 6 19 23* 24*  CREATININE 0.86 0.82 1.01 0.80  CALCIUM 8.7* 9.6 9.4 9.3     GFR: Estimated Creatinine Clearance: 130.8 mL/min (by C-G formula based on SCr of 0.8 mg/dL).  MICROBIOLOGY: Recent Results (from the past 240 hour(s))  Blood culture (routine x 2)     Status: Abnormal   Collection Time: 05/13/21  3:08 AM   Specimen: BLOOD RIGHT HAND  Result Value Ref Range Status   Specimen Description BLOOD RIGHT HAND  Final   Special Requests   Final    BOTTLES DRAWN AEROBIC AND ANAEROBIC Blood Culture adequate volume   Culture  Setup Time   Final    GRAM POSITIVE COCCI IN CLUSTERS IN BOTH AEROBIC AND ANAEROBIC BOTTLES CRITICAL RESULT CALLED TO, READ BACK BY AND VERIFIED WITHArnaldo Natal [facility] 2116 05/13/21 A BROWNING Performed at [facility], [address] [place], [place] [address]    Culture METHICILLIN RESISTANT STAPHYLOCOCCUS AUREUS (A)  Final   Report Status 05/15/2021 FINAL  Final   Organism ID, Bacteria METHICILLIN RESISTANT STAPHYLOCOCCUS AUREUS  Final      Susceptibility   Methicillin resistant staphylococcus aureus - MIC*    CIPROFLOXACIN >=8 RESISTANT Resistant     ERYTHROMYCIN >=8 RESISTANT Resistant     GENTAMICIN <=0.5 SENSITIVE Sensitive     OXACILLIN >=4 RESISTANT  Resistant     TETRACYCLINE <=1 SENSITIVE Sensitive     VANCOMYCIN <=0.5 SENSITIVE Sensitive     TRIMETH/SULFA <=10 SENSITIVE Sensitive     CLINDAMYCIN <=0.25 SENSITIVE Sensitive     RIFAMPIN <=0.5 SENSITIVE Sensitive     Inducible Clindamycin NEGATIVE Sensitive     * METHICILLIN RESISTANT STAPHYLOCOCCUS AUREUS  Blood Culture ID Panel (Reflexed)     Status: Abnormal   Collection Time: 05/13/21  3:08 AM  Result Value Ref Range Status   Enterococcus faecalis NOT DETECTED NOT DETECTED Final   Enterococcus Faecium NOT DETECTED NOT DETECTED Final   Listeria monocytogenes NOT DETECTED NOT DETECTED Final   Staphylococcus species DETECTED (A) NOT DETECTED Final    Comment: CRITICAL RESULT CALLED TO, READ BACK BY AND VERIFIED WITH: Arnaldo Natal PHARMD 2116 05/13/21 A BROWNING    Staphylococcus aureus (BCID) DETECTED (A) NOT DETECTED Final    Comment: Methicillin (oxacillin)-resistant Staphylococcus aureus (MRSA). MRSA is predictably resistant to beta-lactam antibiotics (except ceftaroline). Preferred therapy is vancomycin unless clinically contraindicated. Patient requires contact precautions if  hospitalized. CRITICAL RESULT CALLED TO, READ BACK BY AND VERIFIED WITH: Arnaldo Natal PHARMD 2116 05/13/21 A BROWNING    Staphylococcus epidermidis NOT DETECTED NOT DETECTED Final   Staphylococcus lugdunensis NOT DETECTED NOT DETECTED Final   Streptococcus species NOT DETECTED NOT DETECTED Final   Streptococcus agalactiae NOT DETECTED NOT DETECTED Final   Streptococcus pneumoniae NOT DETECTED NOT DETECTED Final   Streptococcus pyogenes NOT DETECTED NOT DETECTED Final   A.calcoaceticus-baumannii NOT DETECTED NOT DETECTED Final   Bacteroides fragilis NOT DETECTED NOT DETECTED Final   Enterobacterales NOT DETECTED NOT DETECTED Final   Enterobacter cloacae complex NOT DETECTED NOT DETECTED Final   Escherichia coli NOT DETECTED NOT DETECTED Final   Klebsiella aerogenes NOT DETECTED NOT DETECTED Final   Klebsiella  oxytoca NOT DETECTED NOT DETECTED Final   Klebsiella pneumoniae NOT DETECTED NOT DETECTED Final   Proteus species NOT DETECTED NOT DETECTED Final   Salmonella species NOT DETECTED NOT DETECTED Final   Serratia marcescens NOT DETECTED NOT DETECTED Final   Haemophilus  influenzae NOT DETECTED NOT DETECTED Final   Neisseria meningitidis NOT DETECTED NOT DETECTED Final   Pseudomonas aeruginosa NOT DETECTED NOT DETECTED Final   Stenotrophomonas maltophilia NOT DETECTED NOT DETECTED Final   Candida albicans NOT DETECTED NOT DETECTED Final   Candida auris NOT DETECTED NOT DETECTED Final   Candida glabrata NOT DETECTED NOT DETECTED Final   Candida krusei NOT DETECTED NOT DETECTED Final   Candida parapsilosis NOT DETECTED NOT DETECTED Final   Candida tropicalis NOT DETECTED NOT DETECTED Final   Cryptococcus neoformans/gattii NOT DETECTED NOT DETECTED Final   Meth resistant mecA/C and MREJ DETECTED (A) NOT DETECTED Final    Comment: CRITICAL RESULT CALLED TO, READ BACK BY AND VERIFIED WITHArnaldo Natal [facility] 2116 05/13/21 A BROWNING Performed at [facility], [address] [place], [place] [address]   Blood culture (routine x 2)     Status: Abnormal   Collection Time: 05/13/21  3:09 AM   Specimen: BLOOD LEFT HAND  Result Value Ref Range Status   Specimen Description BLOOD LEFT HAND  Final   Special Requests   Final    BOTTLES DRAWN AEROBIC AND ANAEROBIC Blood Culture adequate volume   Culture  Setup Time   Final    GRAM POSITIVE COCCI IN CLUSTERS AEROBIC BOTTLE ONLY CRITICAL VALUE NOTED.  VALUE IS CONSISTENT WITH PREVIOUSLY REPORTED AND CALLED VALUE.    Culture (A)  Final    STAPHYLOCOCCUS AUREUS SUSCEPTIBILITIES PERFORMED ON PREVIOUS CULTURE WITHIN THE LAST 5 DAYS. Performed at [facility], [address] [place], [place] [address]    Report Status 05/16/2021 FINAL  Final  Resp Panel by RT-PCR (Flu A&B, Covid) Nasopharyngeal Swab     Status: Abnormal   Collection Time:  05/13/21  8:07 AM   Specimen: Nasopharyngeal Swab; Nasopharyngeal(NP) swabs in vial transport medium  Result Value Ref Range Status   SARS Coronavirus 2 by RT PCR POSITIVE (A) NEGATIVE Final    Comment: RESULT CALLED TO, READ BACK BY AND VERIFIED WITH: RN Cherlyn Labella [id number] AT 1016 BY CM (NOTE) SARS-CoV-2 target nucleic acids are DETECTED.  The SARS-CoV-2 RNA is generally detectable in upper respiratory specimens during the acute phase of infection. Positive results are indicative of the presence of the identified virus, but do not rule out bacterial infection or co-infection with other pathogens not detected by the test. Clinical correlation with patient history and other diagnostic information is necessary to determine patient infection status. The expected result is Negative.  Fact Sheet for Patients: BloggerCourse.com  Fact Sheet for Healthcare Providers: SeriousBroker.it  This test is not yet approved or cleared by the [place] FDA and  has been authorized for detection and/or diagnosis of SARS-CoV-2 by FDA under an Emergency Use Authorization (EUA).  This EUA will remain in effect (meaning this test can b e used) for the duration of  the COVID-19 declaration under Section 564(b)(1) of the Act, 21 U.S.C. section 360bbb-3(b)(1), unless the authorization is terminated or revoked sooner.     Influenza A by PCR NEGATIVE NEGATIVE Final   Influenza B by PCR NEGATIVE NEGATIVE Final    Comment: (NOTE) The Xpert Xpress SARS-CoV-2/FLU/RSV plus assay is intended as an aid in the diagnosis of influenza from Nasopharyngeal swab specimens and should not be used as a sole basis for treatment. Nasal washings and aspirates are unacceptable for Xpert Xpress SARS-CoV-2/FLU/RSV testing.  Fact Sheet for Patients: BloggerCourse.com  Fact Sheet for Healthcare  Providers: SeriousBroker.it  This test is not  yet approved or cleared by the [place] and has been authorized for detection and/or diagnosis of SARS-CoV-2 by FDA under an Emergency Use Authorization (EUA). This EUA will remain in effect (meaning this test can be used) for the duration of the COVID-19 declaration under Section 564(b)(1) of the Act, 21 U.S.C. section 360bbb-3(b)(1), unless the authorization is terminated or revoked.  Performed at [facility], [address], [place], [place] [address]   Culture, blood (routine x 2)     Status: None   Collection Time: 05/15/21  6:07 AM   Specimen: BLOOD  Result Value Ref Range Status   Specimen Description BLOOD BLOOD LEFT HAND  Final   Special Requests   Final    AEROBIC BOTTLE ONLY Blood Culture results may not be optimal due to an inadequate volume of blood received in culture bottles   Culture   Final    NO GROWTH 5 DAYS Performed at [facility], [address] [place], [place] [address]    Report Status 05/20/2021 FINAL  Final  Culture, blood (routine x 2)     Status: None   Collection Time: 05/15/21  6:07 AM   Specimen: BLOOD  Result Value Ref Range Status   Specimen Description BLOOD BLOOD RIGHT HAND  Final   Special Requests   Final    AEROBIC BOTTLE ONLY Blood Culture results may not be optimal due to an inadequate volume of blood received in culture bottles   Culture   Final    NO GROWTH 5 DAYS Performed at [facility], [address] [place], [place] [address]    Report Status 05/20/2021 FINAL  Final    RADIOLOGY STUDIES/RESULTS: [place] LT UPPER EXTREM LTD SOFT TISSUE NON VASCULAR  Result Date: 05/20/2021 CLINICAL DATA:  Left volar forearm swelling for the past week. EXAM: ULTRASOUND LEFT UPPER EXTREMITY LIMITED TECHNIQUE: Ultrasound examination of the upper extremity soft tissues was performed in the area of clinical concern. COMPARISON:  None. FINDINGS: Focused  ultrasound of the volar left forearm demonstrates no discrete soft tissue mass or fluid collection. IMPRESSION: 1. Negative. No soft tissue mass or fluid collection. Electronically Signed   By: Obie Dredge M.D.   On: 05/20/2021 21:44     LOS: 9 days   Osvaldo Shipper, MD  [facility]    To contact the attending provider between 7A-7P or the covering provider during after hours 7P-7A, please log into the web site [email or web address] and access using universal  password for that web site. If you do not have the password, please call the hospital operator.  05/22/2021, 12:15 PM

## 2021-05-22 NOTE — Progress Notes (Signed)
Peripherally Inserted Central Catheter Placement  The IV Nurse has discussed with the patient and/or persons authorized to consent for the patient, the purpose of this procedure and the potential benefits and risks involved with this procedure.  The benefits include less needle sticks, lab draws from the catheter, and the patient may be discharged home with the catheter. Risks include, but not limited to, infection, bleeding, blood clot (thrombus formation), and puncture of an artery; nerve damage and irregular heartbeat and possibility to perform a PICC exchange if needed/ordered by physician.  Alternatives to this procedure were also discussed.  Bard Power PICC patient education guide, fact sheet on infection prevention and patient information card has been provided to patient /or left at bedside.    PICC Placement Documentation  PICC Single Lumen 05/22/21 Right Brachial 39 cm 0 cm (Active)  Indication for Insertion or Continuance of Line Prolonged intravenous therapies 05/22/21 1211  Exposed Catheter (cm) 0 cm 05/22/21 1211  Site Assessment Clean;Dry;Intact 05/22/21 1211  Line Status Flushed;Saline locked;Blood return noted 05/22/21 1211  Dressing Type Transparent;Securing device 05/22/21 1211  Dressing Status Clean;Dry;Intact 05/22/21 1211  Antimicrobial disc in place? Yes 05/22/21 1211  Safety Lock Not Applicable 05/22/21 1211  Dressing Change Due 05/29/21 05/22/21 1211       Romie Jumper 05/22/2021, 12:14 PM

## 2021-05-23 NOTE — Progress Notes (Signed)
PROGRESS NOTE        PATIENT DETAILS Name: Duane Price Age: 35 y.o. Sex: male Date of Birth: [birth year] Admit Date: 05/13/2021 Admitting Physician Clydie Braun, MD ERX:[id number], No Pcp Per (Inactive)  Brief Narrative: Patient is a 35 y.o. 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  Acknowledges noncompliance to medications (claims Zyvox got lost) and ongoing IV heroin use.  Significant events: 7/1-8/1>> hospitalization for MRSA bacteremia with septic pulmonary emboli and tricuspid valve endocarditis-s/p angio vac debridement.  Discharged on oral linezolid 8/19>> admit for worsening back pain-found to have T12-L1 discitis.    Significant studies: 8/19>> MRI thoracolumbar spine: Early discitis/osteomyelitis T12-L1 8/26>> small mobile density on TV-not significantly changed from prior exam. 8/26>> left forearm ultrasound: No abscess.  Antimicrobial therapy: Vancomycin: 8/19>>  Microbiology data: 8/19>> COVID PCR: Positive 8/19>> blood culture : MRSA  8/21>> blood culture: No growth  Procedures : None  Consults: Infectious disease  DVT Prophylaxis : apixaban (ELIQUIS) tablet 5 mg   Subjective: Patient denies any complaints this morning.  No nausea vomiting.  Wondering if he still has COVID-19.  Asking for repeat test.  Patient was told that we usually do not repeat test for at least 90 days.   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  Infectious diseases following.  Repeat blood cultures from 8/21 is negative so far.   Repeat echo  with small-but unchanged vegetation from prior. Back pain well controlled with methadone and as needed oxycodone.   Patient aware that when we get closer to discharge-he will need to call the methadone clinic and get himself established.   ID following-we will await recommendations regarding duration of antimicrobial therapy. PICC line has been placed.  Small area of induration and proximal left forearm: Probably due to recent IV-no signs of thrombophlebitis.  Ultrasound negative for abscess.  Continue supportive care.    COVID-19 infection: Remains asymptomatic.  Tested positive on 8/19.  Can discontinue airborne isolation on 8/30.  Was not thought to require any COVID-19 related treatments.  History of PE: Looks like a single PE was noted on CT angiogram done on July 19.  Patient remains on Eliquis  Normocytic anemia: No evidence of blood loss-likely due to acute illness/smoldering infection.  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  Will need social work counseling when closer to discharge.  Hyponatremia Sodium level noted to be low since July.  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  Stable.    GERD: PPI  Homelessness  Diet: Diet Order             Diet regular Room service appropriate? Yes; Fluid consistency: Thin  Diet effective now                    Code Status: Full code   Family Communication: None at bedside  Disposition Plan: Status is: Inpatient  Remains inpatient appropriate because:Inpatient level of care appropriate due to severity of illness  Dispo: The patient is from: Home  Anticipated d/c is to: Home              Patient currently is not medically stable to d/c.   Difficult to place patient No   Barriers to Discharge: MRSA bacteremia-not a candidate for outpatient IV antimicrobial therapy.  Needs to remain inpatient until he clears the bacteremia.  Antimicrobial agents: Anti-infectives (From admission, onward)    Start      Dose/Rate Route Frequency Ordered Stop   05/20/21 1215  vancomycin (VANCOREADY) IVPB 750 mg/150 mL        750 mg 150 mL/hr over 60 Minutes Intravenous Every 12 hours 05/20/21 1115     05/16/21 2200  vancomycin (VANCOCIN) IVPB 1000 mg/200 mL premix  Status:  Discontinued        1,000 mg 200 mL/hr over 60 Minutes Intravenous Every 12 hours 05/16/21 1826 05/20/21 1113   05/13/21 1600  vancomycin (VANCOCIN) IVPB 1000 mg/200 mL premix  Status:  Discontinued        1,000 mg 200 mL/hr over 60 Minutes Intravenous Every 8 hours 05/13/21 0818 05/16/21 1301   05/13/21 0715  vancomycin (VANCOREADY) IVPB 1250 mg/250 mL        1,250 mg 166.7 mL/hr over 90 Minutes Intravenous  Once 05/13/21 0655 05/13/21 0925          MEDICATIONS: Scheduled Meds:  apixaban  5 mg Oral BID   Chlorhexidine Gluconate Cloth  6 each Topical Daily   feeding supplement  237 mL Oral BID BM   lidocaine  1 patch Transdermal Q24H   melatonin  3 mg Oral QHS   methadone  25 mg Oral Q12H   nicotine  14 mg Transdermal Daily   pantoprazole  40 mg Oral Daily   polyethylene glycol  17 g Oral BID   senna  2 tablet Oral QHS   sodium chloride flush  3 mL Intravenous Q12H   Continuous Infusions:  vancomycin 750 mg (05/22/21 2335)   PRN Meds:.acetaminophen **OR** acetaminophen, albuterol, bisacodyl, hydrOXYzine, naLOXone (NARCAN)  injection, ondansetron **OR** ondansetron (ZOFRAN) IV, oxyCODONE, sodium chloride flush, sodium phosphate   PHYSICAL EXAM: Vital signs: Vitals:   05/23/21 0000 05/23/21 0345 05/23/21 0346 05/23/21 0841  BP: 110/66 99/60 99/60  101/62  Pulse: 76 80 80 73  Resp: 16 16 16 16   Temp: 98.4 F (36.9 C) 98.4 F (36.9 C) 98.4 F (36.9 C) 98.4 F (36.9 C)  TempSrc: Oral Oral Oral Oral  SpO2: 100% 98% 98% 96%  Weight:       Filed Weights   05/13/21 0700 05/14/21 0400  Weight: 70.9 kg 70.4 kg   Body mass index is 21.05 kg/m.   General appearance: Awake alert.  In no distress Resp: Clear to  auscultation bilaterally.  Normal effort Cardio: S1-S2 is normal regular.  No S3-S4.  No rubs murmurs or bruit GI: Abdomen is soft.  Nontender nondistended.  Bowel sounds are present normal.  No masses organomegaly Extremities: No edema.  Full range of motion of lower extremities. Neurologic: Alert and oriented x3.  No focal neurological deficits.      I have personally reviewed following labs and imaging studies  LABORATORY DATA: CBC: Recent Labs  Lab 05/18/21 0556 05/20/21 0139 05/22/21 0544  WBC 9.2 10.9* 10.1  HGB 10.0* 9.8* 9.4*  HCT 30.6* 29.7* 29.2*  MCV 86.2 85.3 86.4  PLT 394 397 376     Basic Metabolic Panel: Recent Labs  Lab 05/18/21 0556 05/20/21 0139 05/22/21 0544  NA 132* 131* 133*  K 4.4 4.5 4.1  CL 95* 92* 98  CO2 28 30 28   GLUCOSE 95 97 121*  BUN 19 23* 24*  CREATININE 0.82 1.01 0.80  CALCIUM 9.6 9.4 9.3     GFR: Estimated Creatinine Clearance: 130.8 mL/min (by C-G formula based on SCr of 0.8 mg/dL).  MICROBIOLOGY: Recent Results (from the past 240 hour(s))  Culture, blood (routine x 2)     Status: None   Collection Time: 05/15/21  6:07 AM   Specimen: BLOOD  Result Value Ref Range Status   Specimen Description BLOOD BLOOD LEFT HAND  Final   Special Requests   Final    AEROBIC BOTTLE ONLY Blood Culture results may not be optimal due to an inadequate volume of blood received in culture bottles   Culture   Final    NO GROWTH 5 DAYS Performed at [facility], [address] [place], [place]    Report Status 05/20/2021 FINAL  Final  Culture, blood (routine x 2)     Status: None   Collection Time: 05/15/21  6:07 AM   Specimen: BLOOD  Result Value Ref Range Status   Specimen Description BLOOD BLOOD RIGHT HAND  Final   Special Requests   Final    AEROBIC BOTTLE ONLY Blood Culture results may not be optimal due to an inadequate volume of blood received in culture bottles   Culture   Final    NO GROWTH 5 DAYS Performed at  [facility], [address], [place], [place]    Report Status 05/20/2021 FINAL  Final    RADIOLOGY STUDIES/RESULTS: No results found.   LOS: 10 days   05/22/2021, MD  [facility]    To contact the attending provider between 7A-7P or the covering provider during after hours 7P-7A, please log into the web site [email or web address] and access using universal [place] password for that web site. If you do not have the password, please call the hospital operator.  05/23/2021, 10:12 AM

## 2021-05-24 LAB — BASIC METABOLIC PANEL
Anion gap: 8 (ref 5–15)
BUN: 18 mg/dL (ref 6–20)
CO2: 30 mmol/L (ref 22–32)
Calcium: 9.2 mg/dL (ref 8.9–10.3)
Chloride: 95 mmol/L — ABNORMAL LOW (ref 98–111)
Creatinine, Ser: 0.78 mg/dL (ref 0.61–1.24)
GFR, Estimated: >60 mL/min (ref >60)
Glucose, Bld: 108 mg/dL — ABNORMAL HIGH (ref 70–99)
Potassium: 3.9 mmol/L (ref 3.5–5.1)
Sodium: 133 mmol/L — ABNORMAL LOW (ref 135–145)

## 2021-05-24 LAB — CBC
HCT: 28 % — ABNORMAL LOW (ref 39.0–52.0)
Hemoglobin: 9.4 g/dL — ABNORMAL LOW (ref 13.0–17.0)
MCH: 28.2 pg (ref 26.0–34.0)
MCHC: 33.6 g/dL (ref 30.0–36.0)
MCV: 84.1 fL (ref 80.0–100.0)
Platelets: 378 10*3/uL (ref 150–400)
RBC: 3.33 MIL/uL — ABNORMAL LOW (ref 4.22–5.81)
RDW: 14.4 % (ref 11.5–15.5)
WBC: 12 10*3/uL — ABNORMAL HIGH (ref 4.0–10.5)
nRBC: 0 % (ref 0.0–0.2)

## 2021-05-24 LAB — C-REACTIVE PROTEIN: CRP: 14.3 mg/dL — ABNORMAL HIGH (ref <1.0)

## 2021-05-24 LAB — PROCALCITONIN: Procalcitonin: <0.1 ng/mL

## 2021-05-24 NOTE — Progress Notes (Signed)
PROGRESS NOTE        PATIENT DETAILS Name: Duane Price Age: 35 y.o. Sex: male Date of Birth: [birth year] Admit Date: 05/13/2021 Admitting Physician Clydie Braun, MD NKN:[id number], No Pcp Per (Inactive)  Brief Narrative: Patient is a 35 y.o. 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  Acknowledges noncompliance to medications (claims Zyvox got lost) and ongoing IV heroin use.  Significant events: 7/1-8/1>> hospitalization for MRSA bacteremia with septic pulmonary emboli and tricuspid valve endocarditis-s/p angio vac debridement.  Discharged on oral linezolid 8/19>> admit for worsening back pain-found to have T12-L1 discitis.    Significant studies: 8/19>> MRI thoracolumbar spine: Early discitis/osteomyelitis T12-L1 8/26>> small mobile density on TV-not significantly changed from prior exam. 8/26>> left forearm ultrasound: No abscess.  Antimicrobial therapy: Vancomycin: 8/19>>  Microbiology data: 8/19>> COVID PCR: Positive 8/19>> blood culture : MRSA  8/21>> blood culture: No growth  Procedures : None  Consults: Infectious disease  DVT Prophylaxis : apixaban (ELIQUIS) tablet 5 mg   Subjective:  Patient in bed, appears comfortable, denies any headache, no fever, no chest pain or pressure, no shortness of breath , no abdominal pain. No new focal weakness.   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  Infectious diseases following.  Repeat blood cultures from 8/21 is negative so far.   Repeat echo with small-but unchanged vegetation from  prior. Back pain well controlled with methadone and as needed oxycodone.   Patient aware that when we get closer to discharge-he will need to call the methadone clinic and get himself established.   ID following-we will await recommendations regarding duration of antimicrobial therapy. PICC line has been placed.  Small area of induration and proximal left forearm: Probably due to recent IV-no signs of thrombophlebitis.  Ultrasound negative for abscess.  Continue supportive care.    COVID-19 infection: Remains asymptomatic.  Tested positive on 8/19.  Can discontinue airborne isolation on 8/30.  Was not thought to require any COVID-19 related treatments.  History of PE: Looks like a single PE was noted on CT angiogram done on July 19.  Patient remains on Eliquis  Normocytic anemia: No evidence of blood loss-likely due to acute illness/smoldering infection.  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  Will need social work counseling when closer to discharge.  Hyponatremia Sodium level noted to be low since July.  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  Stable.    GERD: PPI  Homelessness  Diet: Diet Order             Diet regular Room service appropriate? Yes; Fluid consistency: Thin  Diet effective now                    Code Status: Full code   Family Communication: None at bedside  Disposition Plan: Status is: Inpatient  Remains inpatient appropriate because:Inpatient level of care appropriate due to severity of illness  Dispo: The patient is from: Home              Anticipated d/c is to: Home  Patient currently is not medically stable to d/c.   Difficult to place patient No   Barriers to Discharge: MRSA bacteremia-not a candidate for outpatient IV antimicrobial therapy.  Needs to remain inpatient until he clears the bacteremia.  Antimicrobial agents: Anti-infectives (From admission, onward)    Start     Dose/Rate Route Frequency Ordered Stop    05/20/21 1215  vancomycin (VANCOREADY) IVPB 750 mg/150 mL        750 mg 150 mL/hr over 60 Minutes Intravenous Every 12 hours 05/20/21 1115     05/16/21 2200  vancomycin (VANCOCIN) IVPB 1000 mg/200 mL premix  Status:  Discontinued        1,000 mg 200 mL/hr over 60 Minutes Intravenous Every 12 hours 05/16/21 1826 05/20/21 1113   05/13/21 1600  vancomycin (VANCOCIN) IVPB 1000 mg/200 mL premix  Status:  Discontinued        1,000 mg 200 mL/hr over 60 Minutes Intravenous Every 8 hours 05/13/21 0818 05/16/21 1301   05/13/21 0715  vancomycin (VANCOREADY) IVPB 1250 mg/250 mL        1,250 mg 166.7 mL/hr over 90 Minutes Intravenous  Once 05/13/21 0655 05/13/21 0925          MEDICATIONS: Scheduled Meds:  apixaban  5 mg Oral BID   Chlorhexidine Gluconate Cloth  6 each Topical Daily   feeding supplement  237 mL Oral BID BM   lidocaine  1 patch Transdermal Q24H   melatonin  3 mg Oral QHS   methadone  25 mg Oral Q12H   nicotine  14 mg Transdermal Daily   pantoprazole  40 mg Oral Daily   polyethylene glycol  17 g Oral BID   senna  2 tablet Oral QHS   sodium chloride flush  3 mL Intravenous Q12H   Continuous Infusions:  vancomycin 750 mg (05/24/21 0139)   PRN Meds:.acetaminophen **OR** acetaminophen, albuterol, bisacodyl, hydrOXYzine, naLOXone (NARCAN)  injection, ondansetron **OR** ondansetron (ZOFRAN) IV, oxyCODONE, sodium chloride flush, sodium phosphate   PHYSICAL EXAM: Vital signs: Vitals:   05/23/21 1953 05/24/21 0001 05/24/21 0346 05/24/21 0812  BP: (!) 105/59 97/60 99/60  102/62  Pulse: 90 83 75 75  Resp: 16 18 18 16   Temp: 98.6 F (37 C) 98.4 F (36.9 C) 98.5 F (36.9 C) 98.5 F (36.9 C)  TempSrc: Oral Oral Oral Oral  SpO2: 94% 95% 97% 97%  Weight:       Filed Weights   05/13/21 0700 05/14/21 0400  Weight: 70.9 kg 70.4 kg   Body mass index is 21.05 kg/m.   Exam:   Awake Alert, No new F.N deficits, Normal affect [place].AT,PERRAL Supple Neck,No JVD, No cervical  lymphadenopathy appriciated.  Symmetrical Chest wall movement, Good air movement bilaterally, CTAB RRR,No Gallops, Rubs or new Murmurs, No Parasternal Heave +ve B.Sounds, Abd Soft, No tenderness, No organomegaly appriciated, No rebound - guarding or rigidity. No Cyanosis, Clubbing or edema, No new Rash or bruise   I have personally reviewed following labs and imaging studies  LABORATORY DATA: CBC: Recent Labs  Lab 05/18/21 0556 05/20/21 0139 05/22/21 0544 05/24/21 0436  WBC 9.2 10.9* 10.1 12.0*  HGB 10.0* 9.8* 9.4* 9.4*  HCT 30.6* 29.7* 29.2* 28.0*  MCV 86.2 85.3 86.4 84.1  PLT 394 397 376 378    Basic Metabolic Panel: Recent Labs  Lab 05/18/21 0556 05/20/21 0139 05/22/21 0544 05/24/21 0436  NA 132* 131* 133* 133*  K 4.4 4.5 4.1 3.9  CL 95* 92* 98 95*  CO2 28 30  28 30  GLUCOSE 95 97 121* 108*  BUN 19 23* 24* 18  CREATININE 0.82 1.01 0.80 0.78  CALCIUM 9.6 9.4 9.3 9.2    GFR: Estimated Creatinine Clearance: 130.8 mL/min (by C-G formula based on SCr of 0.78 mg/dL).   RADIOLOGY STUDIES/RESULTS: No results found.   LOS: 11 days    Signature  Susa Raring M.D on 05/24/2021 at 12:28 PM   -  To page go to [email or web address]

## 2021-05-24 NOTE — Progress Notes (Signed)
    [facility]   Reason for visit: Follow up on T12-L1 discitis  Interval History: WBC 12, remains afebrile.  No worsening of his back pain.  No associated rash or diarrhea.   Blood cultures 05/15/21 ngtd, final Blood cultures 05/13/21 3/3 with MRSA   Physical Exam: Constitutional:  Vitals:   05/24/21 0346 05/24/21 0812  BP: 99/60 102/62  Pulse: 75 75  Resp: 18 16  Temp: 98.5 F (36.9 C) 98.5 F (36.9 C)  SpO2: 97% 97%   patient appears in NAD Respiratory: Normal respiratory effort; CTA B Cardiovascular: RRR Neuro: non-focal, no lower extremity weakness  Review of Systems: Constitutional: negative for fevers and chills Gastrointestinal: negative for nausea and diarrhea Integument/breast: negative for rash  Lab Results  Component Value Date   WBC 12.0 (H) 05/24/2021   HGB 9.4 (L) 05/24/2021   HCT 28.0 (L) 05/24/2021   MCV 84.1 05/24/2021   PLT 378 05/24/2021    Lab Results  Component Value Date   CREATININE 0.78 05/24/2021   BUN 18 05/24/2021   NA 133 (L) 05/24/2021   K 3.9 05/24/2021   CL 95 (L) 05/24/2021   CO2 30 05/24/2021    Lab Results  Component Value Date   ALT 11 05/14/2021   AST 12 (L) 05/14/2021   ALKPHOS 66 05/14/2021     Microbiology: Recent Results (from the past 240 hour(s))  Culture, blood (routine x 2)     Status: None   Collection Time: 05/15/21  6:07 AM   Specimen: BLOOD  Result Value Ref Range Status   Specimen Description BLOOD BLOOD LEFT HAND  Final   Special Requests   Final    AEROBIC BOTTLE ONLY Blood Culture results may not be optimal due to an inadequate volume of blood received in culture bottles   Culture   Final    NO GROWTH 5 DAYS Performed at [facility], [address] [place], [place] [address]    Report Status 05/20/2021 FINAL  Final  Culture, blood (routine x 2)     Status: None   Collection Time: 05/15/21  6:07 AM   Specimen: BLOOD  Result Value Ref Range Status   Specimen  Description BLOOD BLOOD RIGHT HAND  Final   Special Requests   Final    AEROBIC BOTTLE ONLY Blood Culture results may not be optimal due to an inadequate volume of blood received in culture bottles   Culture   Final    NO GROWTH 5 DAYS Performed at [facility], [address] [place], [place] [address]    Report Status 05/20/2021 FINAL  Final    Impression/Plan:  1. T12-L1 discitis - previoulsy with MRSA endocarditis and now with discitis following incomplete treatment.  MRSA grew again in his blood cultures on readmission.   Improving on treatment with vancomycin and no changes.   He is having a good response to treatment and ideally will stay on vancomcyin another week and we will then transition him to oritavancin/dalbavancin for one dose prior to discharge and then x 2 after discharge.  He is in agreement with the plan  2.  Substance abuse - he will continue with a methadone clinic at discharge  3.  COVID -19 infection - asymptomatic and I have removed isolation precautions, now 11 days since his positive test

## 2021-05-25 NOTE — TOC Progression Note (Signed)
Transition of Care [facility]) - Progression Note    Patient Details  Name: Duane Price MRN: [id number] Date of Birth: 06-14-1986  [facility]) CM/SW Contact  Mearl Latin, LCSW Phone Number: 05/25/2021, 12:42 PM  Clinical Narrative:    CSW spoke with patient and provided methadone clinic resources for him to follow up with to continue methadone outside the hospital. He stated he had been to [facility] several times before and is sure [facility] will accept him again. No other needs identified.    Expected Discharge Plan: Home/Self Care Barriers to Discharge: No Barriers Identified  Expected Discharge Plan and Services Expected Discharge Plan: Home/Self Care In-house Referral: Clinical Social Work                                             Social Determinants of Health (SDOH) Interventions    Readmission Risk Interventions No flowsheet data found.

## 2021-05-25 NOTE — Progress Notes (Signed)
PROGRESS NOTE        PATIENT DETAILS Name: Duane Price Age: 35 y.o. Sex: male Date of Birth: August 16, 1986 Admit Date: 05/13/2021 Admitting Physician Clydie Braun, MD [id number], No Pcp Per (Inactive)  Brief Narrative: Patient is a 35 y.o. 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  Acknowledges noncompliance to medications (claims Zyvox got lost) and ongoing IV heroin use.  Significant events: 7/1-8/1>> hospitalization for MRSA bacteremia with septic pulmonary emboli and tricuspid valve endocarditis-s/p angio vac debridement.  Discharged on oral linezolid 8/19>> admit for worsening back pain-found to have T12-L1 discitis.    Significant studies: 8/19>> MRI thoracolumbar spine: Early discitis/osteomyelitis T12-L1 8/26>> small mobile density on TV-not significantly changed from prior exam. 8/26>> left forearm ultrasound: No abscess.  Antimicrobial therapy: Vancomycin: 8/19>>  Microbiology data: 8/19>> COVID PCR: Positive 8/19>> blood culture : MRSA  8/21>> blood culture: No growth  Procedures : None  Consults: Infectious disease  DVT Prophylaxis : apixaban (ELIQUIS) tablet 5 mg   Subjective:  Patient in bed, appears comfortable, denies any headache, no fever, no chest pain or pressure, no shortness of breath , no abdominal pain. No new focal weakness.    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  Infectious diseases following.  Repeat blood cultures from 8/21 is negative so far.   Repeat echo with small-but unchanged vegetation  from prior. Back pain well controlled with methadone and as needed oxycodone.   Patient aware that when we get closer to discharge-he will need to call the methadone clinic and get himself established.   ID following-we will await recommendations regarding duration of antimicrobial therapy. PICC line has been placed.  Small area of induration and proximal left forearm: Probably due to recent IV-no signs of thrombophlebitis.  Ultrasound negative for abscess.  Continue supportive care.    COVID-19 infection: Remains asymptomatic.  Tested positive on 8/19.  Can discontinue airborne isolation on 8/30.  Was not thought to require any COVID-19 related treatments.  History of PE: Looks like a single PE was noted on CT angiogram done on July 19.  Patient remains on Eliquis  Normocytic anemia: No evidence of blood loss-likely due to acute illness/smoldering infection.  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  Will need social work counseling when closer to discharge.  Hyponatremia Sodium level noted to be low since July.  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  Stable.    GERD: PPI  Homelessness  Diet: Diet Order             Diet regular Room service appropriate? Yes; Fluid consistency: Thin  Diet effective now                    Code Status: Full code   Family Communication: None at bedside  Disposition Plan: Status is: Inpatient  Remains inpatient appropriate because:Inpatient level of care appropriate due to severity of illness  Dispo: The patient is from: Home              Anticipated d/c is to:  Home              Patient currently is not medically stable to d/c.   Difficult to place patient No   Barriers to Discharge: MRSA bacteremia-not a candidate for outpatient IV antimicrobial therapy.  Needs to remain inpatient until he clears the bacteremia.  Antimicrobial agents: Anti-infectives (From admission, onward)    Start     Dose/Rate Route Frequency Ordered Stop    05/20/21 1215  vancomycin (VANCOREADY) IVPB 750 mg/150 mL        750 mg 150 mL/hr over 60 Minutes Intravenous Every 12 hours 05/20/21 1115     05/16/21 2200  vancomycin (VANCOCIN) IVPB 1000 mg/200 mL premix  Status:  Discontinued        1,000 mg 200 mL/hr over 60 Minutes Intravenous Every 12 hours 05/16/21 1826 05/20/21 1113   05/13/21 1600  vancomycin (VANCOCIN) IVPB 1000 mg/200 mL premix  Status:  Discontinued        1,000 mg 200 mL/hr over 60 Minutes Intravenous Every 8 hours 05/13/21 0818 05/16/21 1301   05/13/21 0715  vancomycin (VANCOREADY) IVPB 1250 mg/250 mL        1,250 mg 166.7 mL/hr over 90 Minutes Intravenous  Once 05/13/21 0655 05/13/21 0925          MEDICATIONS: Scheduled Meds:  apixaban  5 mg Oral BID   Chlorhexidine Gluconate Cloth  6 each Topical Daily   feeding supplement  237 mL Oral BID BM   lidocaine  1 patch Transdermal Q24H   melatonin  3 mg Oral QHS   methadone  25 mg Oral Q12H   nicotine  14 mg Transdermal Daily   pantoprazole  40 mg Oral Daily   polyethylene glycol  17 g Oral BID   senna  2 tablet Oral QHS   sodium chloride flush  3 mL Intravenous Q12H   Continuous Infusions:  vancomycin Stopped (05/25/21 0239)   PRN Meds:.acetaminophen **OR** acetaminophen, albuterol, bisacodyl, hydrOXYzine, naLOXone (NARCAN)  injection, ondansetron **OR** ondansetron (ZOFRAN) IV, oxyCODONE, sodium chloride flush, sodium phosphate   PHYSICAL EXAM: Vital signs: Vitals:   05/25/21 0018 05/25/21 0359 05/25/21 0812 05/25/21 1209  BP: (!) 100/56 (!) 98/57 97/60 103/64  Pulse: 84 74 82 84  Resp: 17 18 18 18   Temp: 98.5 F (36.9 C) 98 F (36.7 C) 98.3 F (36.8 C) 98 F (36.7 C)  TempSrc: Oral Oral Oral Oral  SpO2: 95% 95% 97% 97%  Weight:       Filed Weights   05/13/21 0700 05/14/21 0400  Weight: 70.9 kg 70.4 kg   Body mass index is 21.05 kg/m.   Exam:   Awake Alert, No new F.N deficits, Normal affect [place].AT,PERRAL Supple Neck,No JVD, No cervical  lymphadenopathy appriciated.  Symmetrical Chest wall movement, Good air movement bilaterally, CTAB RRR,No Gallops, Rubs or new Murmurs, No Parasternal Heave +ve B.Sounds, Abd Soft, No tenderness, No organomegaly appriciated, No rebound - guarding or rigidity. No Cyanosis, Clubbing or edema, No new Rash or bruise    I have personally reviewed following labs and imaging studies  LABORATORY DATA: CBC: Recent Labs  Lab 05/20/21 0139 05/22/21 0544 05/24/21 0436  WBC 10.9* 10.1 12.0*  HGB 9.8* 9.4* 9.4*  HCT 29.7* 29.2* 28.0*  MCV 85.3 86.4 84.1  PLT 397 376 378    Basic Metabolic Panel: Recent Labs  Lab 05/20/21 0139 05/22/21 0544 05/24/21 0436  NA 131* 133* 133*  K 4.5 4.1 3.9  CL 92* 98 95*  CO2 30 28 30   GLUCOSE 97 121* 108*  BUN 23* 24* 18  CREATININE 1.01 0.80 0.78  CALCIUM 9.4 9.3 9.2    GFR: Estimated Creatinine Clearance: 130.8 mL/min (by C-G formula based on SCr of 0.78 mg/dL).   RADIOLOGY STUDIES/RESULTS: No results found.   LOS: 12 days    Signature  M.D on 05/25/2021 at 12:11 PM   -  To page go to [email or web address]

## 2021-05-26 ENCOUNTER — Other Ambulatory Visit (HOSPITAL_COMMUNITY): Payer: Self-pay

## 2021-05-26 LAB — BASIC METABOLIC PANEL
Anion gap: 6 (ref 5–15)
BUN: 15 mg/dL (ref 6–20)
CO2: 32 mmol/L (ref 22–32)
Calcium: 9 mg/dL (ref 8.9–10.3)
Chloride: 96 mmol/L — ABNORMAL LOW (ref 98–111)
Creatinine, Ser: 0.78 mg/dL (ref 0.61–1.24)
GFR, Estimated: >60 mL/min (ref >60)
Glucose, Bld: 133 mg/dL — ABNORMAL HIGH (ref 70–99)
Potassium: 3.9 mmol/L (ref 3.5–5.1)
Sodium: 134 mmol/L — ABNORMAL LOW (ref 135–145)

## 2021-05-26 LAB — CBC
HCT: 28 % — ABNORMAL LOW (ref 39.0–52.0)
Hemoglobin: 9.2 g/dL — ABNORMAL LOW (ref 13.0–17.0)
MCH: 28.3 pg (ref 26.0–34.0)
MCHC: 32.9 g/dL (ref 30.0–36.0)
MCV: 86.2 fL (ref 80.0–100.0)
Platelets: 414 10*3/uL — ABNORMAL HIGH (ref 150–400)
RBC: 3.25 MIL/uL — ABNORMAL LOW (ref 4.22–5.81)
RDW: 14.2 % (ref 11.5–15.5)
WBC: 8.9 10*3/uL (ref 4.0–10.5)
nRBC: 0 % (ref 0.0–0.2)

## 2021-05-26 LAB — VANCOMYCIN, TROUGH: Vancomycin Tr: 8 ug/mL — ABNORMAL LOW (ref 15–20)

## 2021-05-26 NOTE — Progress Notes (Signed)
Pharmacy Antibiotic Note  Duane Price is a 35 y.o. male admitted on 05/13/2021 with Lumbar discitis T12-L1.  Pharmacy has been consulted for Vanco dosing.  ID: Lumbar discitis T12-L1 on MRI, hx IVDU with MRSA bacteremia + veg. Asymptomatic COVID. 10d quarantine - ID recs at least 2 weeks IV abx then oritavancin? Vanc 8/19 >> (16d, 9/4)  8/22 VP/VT 39/19, AUC 719 on 1g q8 >> reduce to 1g q12 8/26: VP/VT 3-12, AUC 556 on 1g/12 (Scr up to 1)>>reduce to 750mg /12h. 9/1 VT 8, ok   8/19 COVID + 8/19 BCx: MRSA  8/21 BCx: NGTD  Plan: Continue Vancomycin 750 mg iv Q 12 Follow up for antibiotic plan 9/4  Weight: 70.4 kg (155 lb 3.3 oz)  Temp (24hrs), Avg:98.1 F (36.7 C), Min:97.8 F (36.6 C), Max:98.3 F (36.8 C)  Recent Labs  Lab 05/20/21 0139 05/20/21 0923 05/22/21 0544 05/24/21 0436 05/26/21 0445 05/26/21 1127  WBC 10.9*  --  10.1 12.0* 8.9  --   CREATININE 1.01  --  0.80 0.78 0.78  --   VANCOTROUGH  --  12*  --   --   --  8*  VANCOPEAK 30  --   --   --   --   --      Estimated Creatinine Clearance: 130.8 mL/min (by C-G formula based on SCr of 0.78 mg/dL).    No Known Allergies  Thank you 07/26/21, PharmD 05/26/2021 1:03 PM

## 2021-05-26 NOTE — Progress Notes (Signed)
PROGRESS NOTE        PATIENT DETAILS Name: Duane Price Age: 35 y.o. Sex: male Date of Birth: 1986/07/19 Admit Date: 05/13/2021 Admitting Physician Clydie Braun, MD [id number], No Pcp Per (Inactive)  Brief Narrative: Patient is a 35 y.o. 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  Acknowledges noncompliance to medications (claims Zyvox got lost) and ongoing IV heroin use.  Significant events: 7/1-8/1>> hospitalization for MRSA bacteremia with septic pulmonary emboli and tricuspid valve endocarditis-s/p angio vac debridement.  Discharged on oral linezolid 8/19>> admit for worsening back pain-found to have T12-L1 discitis.    Significant studies: 8/19>> MRI thoracolumbar spine: Early discitis/osteomyelitis T12-L1 8/26>> small mobile density on TV-not significantly changed from prior exam. 8/26>> left forearm ultrasound: No abscess.  Antimicrobial therapy: Vancomycin: 8/19>>  Microbiology data: 8/19>> COVID PCR: Positive 8/19>> blood culture : MRSA  8/21>> blood culture: No growth  Procedures : None  Consults: Infectious disease  DVT Prophylaxis : apixaban (ELIQUIS) tablet 5 mg   Subjective:  Patient in bed, appears comfortable, denies any headache, no fever, no chest pain or pressure, no shortness of breath , no abdominal pain. No new focal weakness.     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  Infectious diseases following.  Repeat blood cultures from 8/21 is negative so far.   Repeat echo with small-but unchanged vegetation  from prior. Back pain well controlled with methadone and as needed oxycodone.   Patient aware that when we get closer to discharge-he will need to call the methadone clinic and get himself established.   ID following-we will await recommendations regarding duration of antimicrobial therapy. PICC line has been placed.  Small area of induration and proximal left forearm: Probably due to recent IV-no signs of thrombophlebitis.  Ultrasound negative for abscess.  Continue supportive care.    COVID-19 infection: Remains asymptomatic.  Tested positive on 8/19.  Can discontinue airborne isolation on 8/30.  Was not thought to require any COVID-19 related treatments.  History of PE: Looks like a single PE was noted on CT angiogram done on July 19.  Patient remains on Eliquis  Normocytic anemia: No evidence of blood loss-likely due to acute illness/smoldering infection.  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  Will need social work counseling when closer to discharge.  Hyponatremia Sodium level noted to be low since July.  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  Stable.    GERD: PPI  Homelessness  Diet: Diet Order             Diet regular Room service appropriate? Yes; Fluid consistency: Thin  Diet effective now                    Code Status: Full code   Family Communication: None at bedside  Disposition Plan: Status is: Inpatient  Remains inpatient appropriate because:Inpatient level of care appropriate due to severity of illness  Dispo: The patient is from: Home              Anticipated d/c is  to: Home              Patient currently is not medically stable to d/c.   Difficult to place patient No   Barriers to Discharge: MRSA bacteremia-not a candidate for outpatient IV antimicrobial therapy.  Needs to remain inpatient until he clears the bacteremia.  Antimicrobial agents: Anti-infectives (From admission, onward)    Start     Dose/Rate Route Frequency Ordered Stop    05/20/21 1215  vancomycin (VANCOREADY) IVPB 750 mg/150 mL        750 mg 150 mL/hr over 60 Minutes Intravenous Every 12 hours 05/20/21 1115     05/16/21 2200  vancomycin (VANCOCIN) IVPB 1000 mg/200 mL premix  Status:  Discontinued        1,000 mg 200 mL/hr over 60 Minutes Intravenous Every 12 hours 05/16/21 1826 05/20/21 1113   05/13/21 1600  vancomycin (VANCOCIN) IVPB 1000 mg/200 mL premix  Status:  Discontinued        1,000 mg 200 mL/hr over 60 Minutes Intravenous Every 8 hours 05/13/21 0818 05/16/21 1301   05/13/21 0715  vancomycin (VANCOREADY) IVPB 1250 mg/250 mL        1,250 mg 166.7 mL/hr over 90 Minutes Intravenous  Once 05/13/21 0655 05/13/21 0925          MEDICATIONS: Scheduled Meds:  apixaban  5 mg Oral BID   Chlorhexidine Gluconate Cloth  6 each Topical Daily   feeding supplement  237 mL Oral BID BM   lidocaine  1 patch Transdermal Q24H   melatonin  3 mg Oral QHS   methadone  25 mg Oral Q12H   nicotine  14 mg Transdermal Daily   pantoprazole  40 mg Oral Daily   polyethylene glycol  17 g Oral BID   senna  2 tablet Oral QHS   sodium chloride flush  3 mL Intravenous Q12H   Continuous Infusions:  vancomycin 750 mg (05/26/21 0129)   PRN Meds:.acetaminophen **OR** acetaminophen, albuterol, bisacodyl, hydrOXYzine, naLOXone (NARCAN)  injection, ondansetron **OR** ondansetron (ZOFRAN) IV, oxyCODONE, sodium chloride flush, sodium phosphate   PHYSICAL EXAM: Vital signs: Vitals:   05/26/21 0030 05/26/21 0031 05/26/21 0346 05/26/21 0803  BP: 96/60  103/65 103/65  Pulse:  76  79  Resp:  14 16 17   Temp:  98 F (36.7 C) 98.3 F (36.8 C) 97.8 F (36.6 C)  TempSrc:  Oral Axillary Oral  SpO2:  97% 96% 98%  Weight:       Filed Weights   05/13/21 0700 05/14/21 0400  Weight: 70.9 kg 70.4 kg   Body mass index is 21.05 kg/m.   Exam:   Awake Alert, No new F.N deficits, Normal affect [place].AT,PERRAL Supple Neck,No JVD, No cervical lymphadenopathy appriciated.   Symmetrical Chest wall movement, Good air movement bilaterally, CTAB RRR,No Gallops, Rubs or new Murmurs, No Parasternal Heave +ve B.Sounds, Abd Soft, No tenderness, No organomegaly appriciated, No rebound - guarding or rigidity. No Cyanosis, Clubbing or edema, No new Rash or bruise  I have personally reviewed following labs and imaging studies  LABORATORY DATA: CBC: Recent Labs  Lab 05/20/21 0139 05/22/21 0544 05/24/21 0436 05/26/21 0445  WBC 10.9* 10.1 12.0* 8.9  HGB 9.8* 9.4* 9.4* 9.2*  HCT 29.7* 29.2* 28.0* 28.0*  MCV 85.3 86.4 84.1 86.2  PLT 397 376 378 414*    Basic Metabolic Panel: Recent Labs  Lab 05/20/21 0139 05/22/21 0544 05/24/21 0436 05/26/21 0445  NA 131* 133* 133* 134*  K 4.5 4.1 3.9 3.9  CL 92* 98 95* 96*  CO2 30 28 30  32  GLUCOSE 97 121* 108* 133*  BUN 23* 24* 18 15  CREATININE 1.01 0.80 0.78 0.78  CALCIUM 9.4 9.3 9.2 9.0    GFR: Estimated Creatinine Clearance: 130.8 mL/min (by C-G formula based on SCr of 0.78 mg/dL).   RADIOLOGY STUDIES/RESULTS: No results found.   LOS: 13 days    Signature  M.D on 05/26/2021 at 10:21 AM   -  To page go to [email or web address]

## 2021-05-26 NOTE — Plan of Care (Signed)
Pt alert and oriented during am shift, pt reported acute lower back pain, prn pain medication provided as ordered, see mar, pt reported relief of pain with each provided dose, pt encouraged to ambulate in room and to sit in chair for meals, ambulated independently, self care, right single lumen PICC in use for long term antibiotic therapy, respirations even and unlabored, sbar to be provided to on coming nurse.   Problem: Education: Goal: Knowledge of risk factors and measures for prevention of condition will improve Outcome: Progressing   Problem: Respiratory: Goal: Will maintain a patent airway Outcome: Progressing   Problem: Education: Goal: Knowledge of General Education information will improve Description: Including pain rating scale, medication(s)/side effects and non-pharmacologic comfort measures Outcome: Progressing   Problem: Clinical Measurements: Goal: Ability to maintain clinical measurements within normal limits will improve Outcome: Progressing Goal: Diagnostic test results will improve Outcome: Progressing   Problem: Activity: Goal: Risk for activity intolerance will decrease Outcome: Progressing   Problem: Nutrition: Goal: Adequate nutrition will be maintained Outcome: Progressing   Problem: Pain Managment: Goal: General experience of comfort will improve Outcome: Progressing

## 2021-05-27 MED ORDER — ORITAVANCIN DIPHOSPHATE 400 MG IV SOLR
1200.0000 mg | Freq: Once | INTRAVENOUS | Status: AC
  Administered 2021-05-30: 1200 mg via INTRAVENOUS
  Filled 2021-05-27: qty 120

## 2021-05-27 MED ORDER — VANCOMYCIN HCL 750 MG/150ML IV SOLN
750.0000 mg | Freq: Two times a day (BID) | INTRAVENOUS | Status: AC
  Administered 2021-05-27 – 2021-05-29 (×5): 750 mg via INTRAVENOUS
  Filled 2021-05-27 (×5): qty 150

## 2021-05-27 NOTE — Plan of Care (Signed)

## 2021-05-27 NOTE — Progress Notes (Signed)
[facility]   Reason for visit: Follow up on discitis T12-L1  Interval History: no acute eevents.  WBC yesterday wnl.  He remains afebrile.  No complaints today.  Day 15 vancomycin (this hospital stay)  Physical Exam: Constitutional:  Vitals:   05/27/21 0428 05/27/21 0801  BP:  99/61  Pulse:  62  Resp: 16 16  Temp: 98.8 F (37.1 C) 98.7 F (37.1 C)  SpO2: 98% 94%   patient appears in NAD Respiratory: Normal respiratory effort; CTA B Cardiovascular: RRR GI: soft, nt, nd  Review of Systems: Constitutional: negative for fevers and chills Gastrointestinal: negative for nausea and diarrhea Integument/breast: negative for rash  Lab Results  Component Value Date   WBC 8.9 05/26/2021   HGB 9.2 (L) 05/26/2021   HCT 28.0 (L) 05/26/2021   MCV 86.2 05/26/2021   PLT 414 (H) 05/26/2021    Lab Results  Component Value Date   CREATININE 0.78 05/26/2021   BUN 15 05/26/2021   NA 134 (L) 05/26/2021   K 3.9 05/26/2021   CL 96 (L) 05/26/2021   CO2 32 05/26/2021    Lab Results  Component Value Date   ALT 11 05/14/2021   AST 12 (L) 05/14/2021   ALKPHOS 66 05/14/2021     Microbiology: No results found for this or any previous visit (from the past 240 hour(s)).  Impression/Plan:  1. T12-L1 discitis - MRSA known from blood cultures and overall his back pain is stable.  He continues on vancomycin and I recommend continuing this through Tuesday 9/6 and on Tuesday will give him a dose of oritavancin (will place the order) He is scheduled for [facility] for 9/13 and 9/20 for the dalbavancin infusions CRP remains elevated Will check an ESR as well  2.  Medication monitoring - creat has remained wnl on the vancomycin and can continue to monitor 2 times per week  3.  Therapeutic drug monitoring - trough level noted and pharmacy continuing to adjust as indicated.    He has follow up with me on 9/21 at 2:30 pm.

## 2021-05-27 NOTE — Progress Notes (Signed)
PROGRESS NOTE        PATIENT DETAILS Name: Duane Price Age: 35 y.o. Sex: male Date of Birth: [birth year] Admit Date: 05/13/2021 Admitting Physician Clydie Braun, MD POE:[id number], No Pcp Per (Inactive)  Brief Narrative: Patient is a 35 y.o. 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  Acknowledges noncompliance to medications (claims Zyvox got lost) and ongoing IV heroin use.  Significant events: 7/1-8/1>> hospitalization for MRSA bacteremia with septic pulmonary emboli and tricuspid valve endocarditis-s/p angio vac debridement.  Discharged on oral linezolid 8/19>> admit for worsening back pain-found to have T12-L1 discitis.    Significant studies: 8/19>> MRI thoracolumbar spine: Early discitis/osteomyelitis T12-L1 8/26>> small mobile density on TV-not significantly changed from prior exam. 8/26>> left forearm ultrasound: No abscess.  Antimicrobial therapy: Vancomycin: 8/19>>  Microbiology data: 8/19>> COVID PCR: Positive 8/19>> blood culture : MRSA  8/21>> blood culture: No growth  Procedures :  None  Consults:  Infectious disease  DVT Prophylaxis : apixaban (ELIQUIS) tablet 5 mg   Subjective:  Patient in bed, appears comfortable, denies any headache, no fever, no chest pain or pressure, no shortness of breath , no abdominal pain. No new focal weakness.  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  Infectious diseases following.  Repeat blood cultures from 8/21 is negative so far.   Repeat echo with small-but unchanged vegetation from  prior. Back pain well controlled with methadone and as needed oxycodone.  Patient aware that when we get closer to discharge-he will need to call the methadone clinic and get himself established.  ID following-we will await recommendations regarding duration of antimicrobial therapy. PICC line has been placed.  Small area of induration and proximal left forearm: Probably due to recent IV-no signs of thrombophlebitis.  Ultrasound negative for abscess.  Continue supportive care.    COVID-19 infection: Remains asymptomatic.  Tested positive on 8/19.  Can discontinue airborne isolation on 8/30.  Was not thought to require any COVID-19 related treatments.  History of PE: Looks like a single PE was noted on CT angiogram done on July 19.  Patient remains on Eliquis  Normocytic anemia: No evidence of blood loss-likely due to acute illness/smoldering infection.  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  Will need social work counseling when closer to discharge.  Hyponatremia Sodium level noted to be low since July.  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  Stable.    GERD: PPI  Homelessness  Diet: Diet Order             Diet regular Room service appropriate? Yes; Fluid consistency: Thin  Diet effective now                    Code Status: Full code   Family Communication: None at bedside  Disposition Plan: Status is: Inpatient  Remains inpatient appropriate because:Inpatient level of care appropriate due to severity of illness  Dispo: The patient is from: Home              Anticipated d/c is to: Home  Patient currently is not medically stable to d/c.   Difficult to place patient No   Barriers to Discharge: MRSA bacteremia-not a candidate for outpatient IV antimicrobial therapy.  Needs to remain inpatient until he clears the bacteremia.  Antimicrobial agents: Anti-infectives (From admission, onward)    Start     Dose/Rate Route Frequency Ordered Stop   05/20/21  1215  vancomycin (VANCOREADY) IVPB 750 mg/150 mL        750 mg 150 mL/hr over 60 Minutes Intravenous Every 12 hours 05/20/21 1115     05/16/21 2200  vancomycin (VANCOCIN) IVPB 1000 mg/200 mL premix  Status:  Discontinued        1,000 mg 200 mL/hr over 60 Minutes Intravenous Every 12 hours 05/16/21 1826 05/20/21 1113   05/13/21 1600  vancomycin (VANCOCIN) IVPB 1000 mg/200 mL premix  Status:  Discontinued        1,000 mg 200 mL/hr over 60 Minutes Intravenous Every 8 hours 05/13/21 0818 05/16/21 1301   05/13/21 0715  vancomycin (VANCOREADY) IVPB 1250 mg/250 mL        1,250 mg 166.7 mL/hr over 90 Minutes Intravenous  Once 05/13/21 0655 05/13/21 0925          MEDICATIONS: Scheduled Meds:  apixaban  5 mg Oral BID   Chlorhexidine Gluconate Cloth  6 each Topical Daily   feeding supplement  237 mL Oral BID BM   lidocaine  1 patch Transdermal Q24H   melatonin  3 mg Oral QHS   methadone  25 mg Oral Q12H   nicotine  14 mg Transdermal Daily   pantoprazole  40 mg Oral Daily   polyethylene glycol  17 g Oral BID   senna  2 tablet Oral QHS   sodium chloride flush  3 mL Intravenous Q12H   Continuous Infusions:  vancomycin 750 mg (05/27/21 1227)   PRN Meds:.acetaminophen **OR** acetaminophen, albuterol, bisacodyl, hydrOXYzine, naLOXone (NARCAN)  injection, ondansetron **OR** ondansetron (ZOFRAN) IV, oxyCODONE, sodium chloride flush, sodium phosphate   PHYSICAL EXAM: Vital signs: Vitals:   05/27/21 0056 05/27/21 0428 05/27/21 0801 05/27/21 1212  BP: (!) 101/58  99/61 (!) 104/57  Pulse:   62 66  Resp: 18 16 16 16   Temp: 98.4 F (36.9 C) 98.8 F (37.1 C) 98.7 F (37.1 C) 98.7 F (37.1 C)  TempSrc: Oral Oral Oral Oral  SpO2: 98% 98% 94% 97%  Weight:       Filed Weights   05/13/21 0700 05/14/21 0400  Weight: 70.9 kg 70.4 kg   Body mass index is 21.05 kg/m.   Exam:   Awake Alert, No new F.N deficits, Normal affect [place].AT,PERRAL Supple Neck,No JVD, No cervical lymphadenopathy  appriciated.  Symmetrical Chest wall movement, Good air movement bilaterally, CTAB RRR,No Gallops, Rubs or new Murmurs, No Parasternal Heave +ve B.Sounds, Abd Soft, No tenderness, No organomegaly appriciated, No rebound - guarding or rigidity. No Cyanosis, Clubbing or edema, No new Rash or bruise   I have personally reviewed following labs and imaging studies  LABORATORY DATA: CBC: Recent Labs  Lab 05/22/21 0544 05/24/21 0436 05/26/21 0445  WBC 10.1 12.0* 8.9  HGB 9.4* 9.4* 9.2*  HCT 29.2* 28.0* 28.0*  MCV 86.4 84.1 86.2  PLT 376 378 414*    Basic Metabolic Panel: Recent Labs  Lab 05/22/21 0544 05/24/21 0436 05/26/21 0445  NA 133* 133* 134*  K 4.1 3.9 3.9  CL 98 95* 96*  CO2 28 30 32  GLUCOSE 121* 108* 133*  BUN 24* 18 15  CREATININE 0.80 0.78 0.78  CALCIUM 9.3 9.2 9.0    GFR: Estimated Creatinine Clearance: 130.8 mL/min (by C-G formula based on SCr of 0.78 mg/dL).   RADIOLOGY STUDIES/RESULTS: No results found.   LOS: 14 days    Signature  Susa Raring M.D on 05/27/2021 at 12:44 PM   -  To page go to [email or web address]

## 2021-05-28 LAB — CBC
HCT: 29.3 % — ABNORMAL LOW (ref 39.0–52.0)
Hemoglobin: 9.2 g/dL — ABNORMAL LOW (ref 13.0–17.0)
MCH: 26.8 pg (ref 26.0–34.0)
MCHC: 31.4 g/dL (ref 30.0–36.0)
MCV: 85.4 fL (ref 80.0–100.0)
Platelets: 461 10*3/uL — ABNORMAL HIGH (ref 150–400)
RBC: 3.43 MIL/uL — ABNORMAL LOW (ref 4.22–5.81)
RDW: 14.2 % (ref 11.5–15.5)
WBC: 9.1 10*3/uL (ref 4.0–10.5)
nRBC: 0 % (ref 0.0–0.2)

## 2021-05-28 NOTE — Progress Notes (Signed)
PROGRESS NOTE        PATIENT DETAILS Name: Duane Price Age: 35 y.o. Sex: male Date of Birth: 1986-01-07 Admit Date: 05/13/2021 Admitting Physician Clydie Braun, MD ION:[id number], No Pcp Per (Inactive)  Brief Narrative: Patient is a 35 y.o. 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  Acknowledges noncompliance to medications (claims Zyvox got lost) and ongoing IV heroin use.  Significant events: 7/1-8/1>> hospitalization for MRSA bacteremia with septic pulmonary emboli and tricuspid valve endocarditis-s/p angio vac debridement.  Discharged on oral linezolid 8/19>> admit for worsening back pain-found to have T12-L1 discitis.    Significant studies: 8/19>> MRI thoracolumbar spine: Early discitis/osteomyelitis T12-L1 8/26>> small mobile density on TV-not significantly changed from prior exam. 8/26>> left forearm ultrasound: No abscess.  Antimicrobial therapy: Vancomycin: 8/19>>  Microbiology data: 8/19>> COVID PCR: Positive 8/19>> blood culture : MRSA  8/21>> blood culture: No growth  Procedures :  None  Consults:  Infectious disease  DVT Prophylaxis : apixaban (ELIQUIS) tablet 5 mg   Subjective:  Patient in bed, appears comfortable, denies any headache, no fever, no chest pain or pressure, no shortness of breath , no abdominal pain. No new focal weakness.   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  Infectious diseases following.  Repeat blood cultures from 8/21 is negative so far.   Repeat echo with small-but unchanged vegetation from  prior. Back pain well controlled with methadone and as needed oxycodone.  Patient aware that when we get closer to discharge-he will need to call the methadone clinic and get himself established.  ID following-we will await recommendations regarding duration of antimicrobial therapy. PICC line has been placed.  Small area of induration and proximal left forearm: Probably due to recent IV-no signs of thrombophlebitis.  Ultrasound negative for abscess.  Continue supportive care.    COVID-19 infection: Remains asymptomatic.  Tested positive on 8/19.  Can discontinue airborne isolation on 8/30.  Was not thought to require any COVID-19 related treatments.  History of PE: Looks like a single PE was noted on CT angiogram done on July 19.  Patient remains on Eliquis  Normocytic anemia: No evidence of blood loss-likely due to acute illness/smoldering infection.  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  Will need social work counseling when closer to discharge.  Hyponatremia Sodium level noted to be low since July.  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  Stable.    GERD: PPI  Homelessness  Diet: Diet Order             Diet regular Room service appropriate? Yes; Fluid consistency: Thin  Diet effective now                    Code Status: Full code   Family Communication: None at bedside  Disposition Plan: Status is: Inpatient  Remains inpatient appropriate because:Inpatient level of care appropriate due to severity of illness  Dispo: The patient is from: Home              Anticipated d/c is to: Home  Patient currently is not medically stable to d/c.   Difficult to place patient No   Barriers to Discharge: MRSA bacteremia-not a candidate for outpatient IV antimicrobial therapy.  Needs to remain inpatient until he clears the bacteremia.  Antimicrobial agents: Anti-infectives (From admission, onward)    Start     Dose/Rate Route Frequency Ordered Stop   05/30/21  0600  Oritavancin Diphosphate (ORBACTIV) 1,200 mg in dextrose 5 % IVPB        1,200 mg 333.3 mL/hr over 180 Minutes Intravenous Once 05/27/21 1425     05/27/21 2200  vancomycin (VANCOREADY) IVPB 750 mg/150 mL        750 mg 150 mL/hr over 60 Minutes Intravenous Every 12 hours 05/27/21 1351 05/29/21 2359   05/20/21 1215  vancomycin (VANCOREADY) IVPB 750 mg/150 mL  Status:  Discontinued        750 mg 150 mL/hr over 60 Minutes Intravenous Every 12 hours 05/20/21 1115 05/27/21 1351   05/16/21 2200  vancomycin (VANCOCIN) IVPB 1000 mg/200 mL premix  Status:  Discontinued        1,000 mg 200 mL/hr over 60 Minutes Intravenous Every 12 hours 05/16/21 1826 05/20/21 1113   05/13/21 1600  vancomycin (VANCOCIN) IVPB 1000 mg/200 mL premix  Status:  Discontinued        1,000 mg 200 mL/hr over 60 Minutes Intravenous Every 8 hours 05/13/21 0818 05/16/21 1301   05/13/21 0715  vancomycin (VANCOREADY) IVPB 1250 mg/250 mL        1,250 mg 166.7 mL/hr over 90 Minutes Intravenous  Once 05/13/21 0655 05/13/21 0925          MEDICATIONS: Scheduled Meds:  apixaban  5 mg Oral BID   Chlorhexidine Gluconate Cloth  6 each Topical Daily   feeding supplement  237 mL Oral BID BM   lidocaine  1 patch Transdermal Q24H   melatonin  3 mg Oral QHS   methadone  25 mg Oral Q12H   nicotine  14 mg Transdermal Daily   pantoprazole  40 mg Oral Daily   polyethylene glycol  17 g Oral BID   senna  2 tablet Oral QHS   sodium chloride flush  3 mL Intravenous Q12H   Continuous Infusions:  [START ON 05/30/2021] oritavancin (ORBACTIV) IVPB     vancomycin 750 mg (05/28/21 0925)   PRN Meds:.acetaminophen **OR** acetaminophen, albuterol, bisacodyl, hydrOXYzine, naLOXone (NARCAN)  injection, ondansetron **OR** ondansetron (ZOFRAN) IV, oxyCODONE, sodium chloride flush, sodium phosphate   PHYSICAL EXAM: Vital signs: Vitals:   05/27/21 1958 05/27/21 2350 05/28/21 0416 05/28/21 0813  BP: 105/60 98/60  103/63  Pulse: 77 72 72 75   Resp: 18 18 17 16   Temp: 98.2 F (36.8 C) 98.3 F (36.8 C) 98.1 F (36.7 C) 97.7 F (36.5 C)  TempSrc: Oral Oral Oral Oral  SpO2: 96% 98% 98% 96%  Weight:       Filed Weights   05/13/21 0700 05/14/21 0400  Weight: 70.9 kg 70.4 kg   Body mass index is 21.05 kg/m.   Exam:   Awake Alert, No new F.N deficits, Normal affect [place].AT,PERRAL Supple Neck,No JVD, No cervical lymphadenopathy appriciated.  Symmetrical Chest wall movement, Good air movement bilaterally, CTAB RRR,No Gallops, Rubs or new Murmurs, No Parasternal Heave +ve B.Sounds, Abd Soft, No tenderness, No organomegaly appriciated, No rebound - guarding or rigidity. No Cyanosis, Clubbing or edema, No new Rash or bruise   I have personally reviewed following labs and imaging studies  LABORATORY DATA: CBC: Recent  Labs  Lab 05/22/21 0544 05/24/21 0436 05/26/21 0445 05/28/21 0340  WBC 10.1 12.0* 8.9 9.1  HGB 9.4* 9.4* 9.2* 9.2*  HCT 29.2* 28.0* 28.0* 29.3*  MCV 86.4 84.1 86.2 85.4  PLT 376 378 414* 461*    Basic Metabolic Panel: Recent Labs  Lab 05/22/21 0544 05/24/21 0436 05/26/21 0445  NA 133* 133* 134*  K 4.1 3.9 3.9  CL 98 95* 96*  CO2 28 30 32  GLUCOSE 121* 108* 133*  BUN 24* 18 15  CREATININE 0.80 0.78 0.78  CALCIUM 9.3 9.2 9.0    GFR: Estimated Creatinine Clearance: 130.8 mL/min (by C-G formula based on SCr of 0.78 mg/dL).   RADIOLOGY STUDIES/RESULTS: No results found.   LOS: 15 days    Signature  Susa Raring M.D on 05/28/2021 at 10:17 AM   -  To page go to [email or web address]

## 2021-05-29 NOTE — Progress Notes (Signed)
Pharmacy Antibiotic Note  Duane Price is a 35 y.o. male admitted on 05/13/2021 with Lumbar discitis T12-L1.  Pharmacy has been consulted for Vanco dosing.  ID: Lumbar discitis T12-L1 on MRI, hx IVDU with MRSA bacteremia + veg. Asymptomatic COVID. 10d quarantine - ID recs at least 2 weeks IV abx then oritavancin  Vanc 8/19 >> (9/4) 9/1 VT 8  8/19 COVID + 8/19 BCx: MRSA  8/21 BCx: NGTD  Plan: Continue Vancomycin 750 mg iv Q 12 (ending today) Patient scheduled to receive oritavancin on Monday 9/5 then DC  Weight: 70.4 kg (155 lb 3.3 oz)  Temp (24hrs), Avg:98.5 F (36.9 C), Min:97.7 F (36.5 C), Max:99.3 F (37.4 C)  Recent Labs  Lab 05/24/21 0436 05/26/21 0445 05/26/21 1127 05/28/21 0340  WBC 12.0* 8.9  --  9.1  CREATININE 0.78 0.78  --   --   VANCOTROUGH  --   --  8*  --      Estimated Creatinine Clearance: 130.8 mL/min (by C-G formula based on SCr of 0.78 mg/dL).    No Known Allergies  Georgeann Oppenheim, PharmD Pharmacy Resident 05/29/2021, 12:17 PM

## 2021-05-29 NOTE — Progress Notes (Signed)
PROGRESS NOTE        PATIENT DETAILS Name: Duane Price Age: 35 y.o. Sex: male Date of Birth: 1985/12/15 Admit Date: 05/13/2021 Admitting Physician Clydie Braun, MD HWE:[id number], No Pcp Per (Inactive)  Brief Narrative: Patient is a 36 y.o. 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  Acknowledges noncompliance to medications (claims Zyvox got lost) and ongoing IV heroin use.  Significant events: 7/1-8/1>> hospitalization for MRSA bacteremia with septic pulmonary emboli and tricuspid valve endocarditis-s/p angio vac debridement.  Discharged on oral linezolid 8/19>> admit for worsening back pain-found to have T12-L1 discitis.    Significant studies: 8/19>> MRI thoracolumbar spine: Early discitis/osteomyelitis T12-L1 8/26>> small mobile density on TV-not significantly changed from prior exam. 8/26>> left forearm ultrasound: No abscess.  Antimicrobial therapy: Vancomycin: 8/19>>  Microbiology data: 8/19>> COVID PCR: Positive 8/19>> blood culture : MRSA  8/21>> blood culture: No growth  Procedures :  None  Consults:  Infectious disease  DVT Prophylaxis : apixaban (ELIQUIS) tablet 5 mg   Subjective:  Patient in bed, appears comfortable, denies any headache, no fever, no chest pain or pressure, no shortness of breath , no abdominal pain. No new focal weakness.   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  Infectious diseases following.  Repeat blood cultures from 8/21 is negative so far. Repeat echo with small-but unchanged vegetation from  prior. Back pain well controlled with methadone and as needed oxycodone.  Patient aware that when we get closer to discharge-he will need to call the methadone clinic and get himself established.  ID following-we will await recommendations regarding duration of antimicrobial therapy. PICC line has been placed.  Small area of induration and proximal left forearm: Probably due to recent IV-no signs of thrombophlebitis.  Ultrasound negative for abscess.  Continue supportive care.    COVID-19 infection: Remains asymptomatic.  Tested positive on 8/19.  Can discontinue airborne isolation on 8/30.  Was not thought to require any COVID-19 related treatments.  History of PE: Looks like a single PE was noted on CT angiogram done on July 19.  Patient remains on Eliquis  Normocytic anemia: No evidence of blood loss-likely due to acute illness/smoldering infection.  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  Will need social work counseling when closer to discharge.  Hyponatremia - Sodium level noted to be low since July.  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  Stable.    GERD: PPI  Homelessness  Diet: Diet Order             Diet regular Room service appropriate? Yes; Fluid consistency: Thin  Diet effective now                    Code Status: Full code   Family Communication: None at bedside  Disposition Plan: Status is: Inpatient  Remains inpatient appropriate because:Inpatient level of care appropriate due to severity of illness  Dispo: The patient is from: Home              Anticipated d/c is to: Home  Patient currently is not medically stable to d/c.   Difficult to place patient No   Barriers to Discharge: MRSA bacteremia-not a candidate for outpatient IV antimicrobial therapy.  Needs to remain inpatient until he clears the bacteremia.  Antimicrobial agents: Anti-infectives (From admission, onward)    Start     Dose/Rate Route Frequency Ordered Stop    05/30/21 0600  Oritavancin Diphosphate (ORBACTIV) 1,200 mg in dextrose 5 % IVPB        1,200 mg 333.3 mL/hr over 180 Minutes Intravenous Once 05/27/21 1425     05/27/21 2200  vancomycin (VANCOREADY) IVPB 750 mg/150 mL        750 mg 150 mL/hr over 60 Minutes Intravenous Every 12 hours 05/27/21 1351 05/29/21 2359   05/20/21 1215  vancomycin (VANCOREADY) IVPB 750 mg/150 mL  Status:  Discontinued        750 mg 150 mL/hr over 60 Minutes Intravenous Every 12 hours 05/20/21 1115 05/27/21 1351   05/16/21 2200  vancomycin (VANCOCIN) IVPB 1000 mg/200 mL premix  Status:  Discontinued        1,000 mg 200 mL/hr over 60 Minutes Intravenous Every 12 hours 05/16/21 1826 05/20/21 1113   05/13/21 1600  vancomycin (VANCOCIN) IVPB 1000 mg/200 mL premix  Status:  Discontinued        1,000 mg 200 mL/hr over 60 Minutes Intravenous Every 8 hours 05/13/21 0818 05/16/21 1301   05/13/21 0715  vancomycin (VANCOREADY) IVPB 1250 mg/250 mL        1,250 mg 166.7 mL/hr over 90 Minutes Intravenous  Once 05/13/21 0655 05/13/21 0925          MEDICATIONS: Scheduled Meds:  apixaban  5 mg Oral BID   Chlorhexidine Gluconate Cloth  6 each Topical Daily   feeding supplement  237 mL Oral BID BM   lidocaine  1 patch Transdermal Q24H   melatonin  3 mg Oral QHS   methadone  25 mg Oral Q12H   nicotine  14 mg Transdermal Daily   pantoprazole  40 mg Oral Daily   polyethylene glycol  17 g Oral BID   senna  2 tablet Oral QHS   sodium chloride flush  3 mL Intravenous Q12H   Continuous Infusions:  [START ON 05/30/2021] oritavancin (ORBACTIV) IVPB     vancomycin 750 mg (05/29/21 0951)   PRN Meds:.acetaminophen **OR** acetaminophen, albuterol, bisacodyl, hydrOXYzine, naLOXone (NARCAN)  injection, ondansetron **OR** ondansetron (ZOFRAN) IV, oxyCODONE, sodium chloride flush, sodium phosphate   PHYSICAL EXAM: Vital signs: Vitals:   05/28/21 1612 05/28/21 2028 05/29/21 0420 05/29/21 0759  BP: (!) 102/58 109/60 (!) 93/57 95/60   Pulse: 76 81  75  Resp: 15 18 18 17   Temp: 98.4 F (36.9 C) 99.3 F (37.4 C) 99 F (37.2 C) 98 F (36.7 C)  TempSrc: Oral Oral Oral Oral  SpO2: 96% 97% 95% 96%  Weight:       Filed Weights   05/13/21 0700 05/14/21 0400  Weight: 70.9 kg 70.4 kg   Body mass index is 21.05 kg/m.   Exam:   Awake Alert, No new F.N deficits, Normal affect [place].AT,PERRAL Supple Neck,No JVD, No cervical lymphadenopathy appriciated.  Symmetrical Chest wall movement, Good air movement bilaterally, CTAB RRR,No Gallops, Rubs or new Murmurs, No Parasternal Heave +ve B.Sounds, Abd Soft, No tenderness, No organomegaly appriciated, No rebound - guarding or rigidity. No Cyanosis, Clubbing or edema, No new Rash or bruise    I have personally reviewed following labs and imaging studies  LABORATORY  DATA: CBC: Recent Labs  Lab 05/24/21 0436 05/26/21 0445 05/28/21 0340  WBC 12.0* 8.9 9.1  HGB 9.4* 9.2* 9.2*  HCT 28.0* 28.0* 29.3*  MCV 84.1 86.2 85.4  PLT 378 414* 461*    Basic Metabolic Panel: Recent Labs  Lab 05/24/21 0436 05/26/21 0445  NA 133* 134*  K 3.9 3.9  CL 95* 96*  CO2 30 32  GLUCOSE 108* 133*  BUN 18 15  CREATININE 0.78 0.78  CALCIUM 9.2 9.0    GFR: Estimated Creatinine Clearance: 130.8 mL/min (by C-G formula based on SCr of 0.78 mg/dL).   RADIOLOGY STUDIES/RESULTS: No results found.   LOS: 16 days    Signature  Susa Raring M.D on 05/29/2021 at 10:51 AM   -  To page go to [email or web address]

## 2021-05-30 NOTE — Discharge Summary (Signed)
Duane Price:[id number] DOB: 21-Nov-1985 DOA: 05/13/2021  PCP: Patient, No Pcp Per (Inactive)  Admit date: 05/13/2021  Discharge date: 05/30/2021  Admitted From: Home  Disposition:  Home   Recommendations for Outpatient Follow-up:   Follow up with PCP in 1-2 weeks  PCP Please obtain BMP/CBC, 2 view CXR in 1week,  (see Discharge instructions)   PCP Please follow up on the following pending results:   Home Health: None   Equipment/Devices: None  Consultations: ID Discharge Condition: Stable    CODE STATUS: Full    Diet Recommendation: Heart Healthy   Diet Order             Diet - low sodium heart healthy           Diet regular Room service appropriate? Yes; Fluid consistency: Thin  Diet effective now                    Chief Complaint  Patient presents with   Back Pain     Brief history of present illness from the day of admission and additional interim summary    Patient is a 35 y.o. 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  Acknowledges noncompliance to medications (claims Zyvox got lost) and ongoing IV heroin use.   Significant events: 7/1-8/1>> hospitalization for MRSA bacteremia with septic pulmonary emboli and tricuspid valve endocarditis-s/p angio vac debridement.  Discharged on oral linezolid 8/19>> admit for worsening back pain-found to have T12-L1 discitis.     Significant studies: 8/19>> MRI thoracolumbar spine: Early discitis/osteomyelitis T12-L1 8/26>> small mobile density on TV-not significantly changed from prior exam. 8/26>> left forearm ultrasound: No abscess.    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  Infectious diseases saw the patient.  Repeat blood cultures from 8/21 were negative . Repeat echo with small-but unchanged vegetation from prior. Back pain well controlled with methadone and as needed oxycodone.  Patient aware that when we get closer to discharge-he will need to call the methadone clinic and get himself established - has done that.  ID has now transitioned him on Oritavancin, first dose given 05/30/21 - now DC home ( per SW)  with ID follow up.   Small area of induration and proximal left forearm: Probably due to recent IV-no signs of thrombophlebitis.  Ultrasound negative for abscess. Resolved.     COVID-19 infection: Remains asymptomatic.  Tested positive on 8/19.  Can discontinue airborne isolation on 8/30.  Was not thought to require any COVID-19 related treatments.   History of PE: Looks like a single PE was noted on CT angiogram done on July 19.  Patient remains on Eliquis.   Normocytic anemia: No evidence of blood loss-likely due to acute illness/smoldering infection.   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   Hyponatremia - mild SIADH, stable.     GERD: PPI   Discharge diagnosis     Principal Problem:   Discitis, unspecified, thoracolumbar region Active Problems:   Normocytic anemia   IV drug abuse (HCC)   Endocarditis of tricuspid valve   HCV antibody positive   Osteomyelitis (HCC)   Leukocytosis   History of bacteremia   History of pulmonary embolus  (PE)   Homeless   Polysubstance abuse (HCC)    Discharge instructions    Discharge Instructions     Diet - low sodium heart healthy   Complete by: As directed    Discharge instructions   Complete by: As directed    Follow with Primary MD in 7 days   Get CBC, CMP, 2 view Chest X ray -  checked next visit within 1 week by Primary MD   Activity: As tolerated with Full fall precautions use walker/cane & assistance as needed  Disposition Home     Diet: Heart Healthy   Special Instructions: If you have smoked or chewed Tobacco  in the last 2 yrs please stop smoking, stop any regular Alcohol  and or any Recreational drug use.  On your next visit with your primary care physician please Get Medicines reviewed and adjusted.  Please request your Prim.MD to go over all Hospital Tests and Procedure/Radiological results at the follow up, please get all Hospital records sent to your Prim MD by signing hospital release before you go home.  If you experience worsening of your admission symptoms, develop shortness of breath, life threatening emergency, suicidal or homicidal thoughts you must seek medical attention immediately by calling 911 or calling your MD immediately  if symptoms less severe.  You Must read complete instructions/literature along with all the possible adverse reactions/side effects for all the Medicines you take and that have been prescribed to you. Take any new Medicines after you have completely understood and accpet all the possible adverse reactions/side effects.   Increase activity slowly   Complete by: As directed        Discharge Medications   Allergies as of 05/30/2021   No Known Allergies      Medication List     STOP taking these medications    feeding supplement Liqd   linezolid 600 MG tablet Commonly known as: ZYVOX   melatonin 3 MG Tabs tablet       TAKE these medications    Eliquis 5 MG Tabs tablet Generic drug: apixaban Take 1 tablet (5  mg total) by mouth 2 (two) times daily.   hydrOXYzine 25 MG tablet Commonly known as: ATARAX/VISTARIL Take 1 tablet (25 mg total) by mouth 3 (three) times daily as needed for anxiety.   pantoprazole 40 MG tablet Commonly known as: PROTONIX Take 1 tablet (40 mg total) by mouth daily.  Follow-up Information     [facility]. Schedule an appointment as soon as possible for a visit in 1 week(s).   Contact information: [address] [place] [phone]        Gardiner Barefootomer, Robert W, MD. Schedule an appointment as soon as possible for a visit in 1 week(s).   Specialty: Infectious Diseases Contact information: [address] [place] [phone]                 Major procedures and Radiology Reports - PLEASE review detailed and final reports thoroughly  -       MR THORACIC SPINE W WO CONTRAST  Result Date: 05/13/2021 CLINICAL DATA:  Mid back pain, bacteremia, IV drug abuse EXAM: MRI THORACIC AND LUMBAR SPINE WITHOUT AND WITH CONTRAST TECHNIQUE: Multiplanar and multiecho pulse sequences of the thoracic and lumbar spine were obtained without and with intravenous contrast. CONTRAST:  6.535mL GADAVIST GADOBUTROL 1 MMOL/ML IV SOLN COMPARISON:  MRI thoracic spine 04/10/2021. FINDINGS: MRI THORACIC SPINE FINDINGS Alignment:  Physiologic. Vertebrae: No fracture, evidence of discitis, or bone lesion. Cord:  Normal signal and morphology. Paraspinal and other soft tissues: Negative. Disc levels: Disc spaces:  Degenerative disease with disc height loss at T8-9. T1-T2: No disc protrusion, foraminal stenosis or central canal stenosis. T2-T3: No disc protrusion, foraminal stenosis or central canal stenosis. T3-T4: No disc protrusion, foraminal stenosis or central canal stenosis. T4-T5: No disc protrusion, foraminal stenosis or central canal stenosis. T5-T6: No disc protrusion, foraminal stenosis or central  canal stenosis. T6-T7: No disc protrusion, foraminal stenosis or central canal stenosis. T7-T8: No disc protrusion, foraminal stenosis or central canal stenosis. T8-T9: Small right paracentral disc protrusion. No foraminal or central canal stenosis. T9-T10: No disc protrusion, foraminal stenosis or central canal stenosis. T10-T11: No disc protrusion, foraminal stenosis or central canal stenosis. T11-T12: Small left paracentral disc protrusion. No foraminal or central canal stenosis. MRI LUMBAR SPINE FINDINGS Segmentation:  Standard. Alignment:  Physiologic. Vertebrae: No acute fracture. No aggressive osseous lesion. Disc height loss at T12-L1 with minimal fluid signal in the disc space and increase bone marrow edema in the adjacent vertebral body endplates compared with MRI of the thoracic spine dated 04/10/2021, concerning for early discitis-osteomyelitis. No epidural fluid collection to suggest an abscess. No paravertebral fluid collection. Conus medullaris: Extends to the L1 level and appears normal. Paraspinal and other soft tissues: No acute paraspinal abnormality. Disc levels: Disc spaces: Degenerative disease with disc height loss T12-L1. T12-L1: Small central disc protrusion. No foraminal or central canal stenosis. L1-L2: No significant disc bulge. No neural foraminal stenosis. No central canal stenosis. L2-L3: No significant disc bulge. No neural foraminal stenosis. No central canal stenosis. L3-L4: No significant disc bulge. No neural foraminal stenosis. No central canal stenosis. L4-L5: No significant disc bulge. No neural foraminal stenosis. No central canal stenosis. L5-S1: No significant disc bulge. No neural foraminal stenosis. No central canal stenosis. IMPRESSION: 1. Findings concerning for early discitis-osteomyelitis at T12-L1 with increase bone marrow edema in the adjacent vertebral body endplates compared with recent thoracic spine MRI dated 04/10/2021. No epidural or paravertebral fluid  collection to suggest an abscess. 2. Mild thoracolumbar spondylosis as described above. Electronically Signed   By: Elige KoHetal  Patel M.D.   On: 05/13/2021 06:51   MR Lumbar Spine W Wo Contrast  Result Date: 05/13/2021 CLINICAL DATA:  Mid back pain, bacteremia, IV drug abuse EXAM: MRI THORACIC AND LUMBAR SPINE WITHOUT AND WITH  CONTRAST TECHNIQUE: Multiplanar and multiecho pulse sequences of the thoracic and lumbar spine were obtained without and with intravenous contrast. CONTRAST:  6.57mL GADAVIST GADOBUTROL 1 MMOL/ML IV SOLN COMPARISON:  MRI thoracic spine 04/10/2021. FINDINGS: MRI THORACIC SPINE FINDINGS Alignment:  Physiologic. Vertebrae: No fracture, evidence of discitis, or bone lesion. Cord:  Normal signal and morphology. Paraspinal and other soft tissues: Negative. Disc levels: Disc spaces:  Degenerative disease with disc height loss at T8-9. T1-T2: No disc protrusion, foraminal stenosis or central canal stenosis. T2-T3: No disc protrusion, foraminal stenosis or central canal stenosis. T3-T4: No disc protrusion, foraminal stenosis or central canal stenosis. T4-T5: No disc protrusion, foraminal stenosis or central canal stenosis. T5-T6: No disc protrusion, foraminal stenosis or central canal stenosis. T6-T7: No disc protrusion, foraminal stenosis or central canal stenosis. T7-T8: No disc protrusion, foraminal stenosis or central canal stenosis. T8-T9: Small right paracentral disc protrusion. No foraminal or central canal stenosis. T9-T10: No disc protrusion, foraminal stenosis or central canal stenosis. T10-T11: No disc protrusion, foraminal stenosis or central canal stenosis. T11-T12: Small left paracentral disc protrusion. No foraminal or central canal stenosis. MRI LUMBAR SPINE FINDINGS Segmentation:  Standard. Alignment:  Physiologic. Vertebrae: No acute fracture. No aggressive osseous lesion. Disc height loss at T12-L1 with minimal fluid signal in the disc space and increase bone marrow edema in the  adjacent vertebral body endplates compared with MRI of the thoracic spine dated 04/10/2021, concerning for early discitis-osteomyelitis. No epidural fluid collection to suggest an abscess. No paravertebral fluid collection. Conus medullaris: Extends to the L1 level and appears normal. Paraspinal and other soft tissues: No acute paraspinal abnormality. Disc levels: Disc spaces: Degenerative disease with disc height loss T12-L1. T12-L1: Small central disc protrusion. No foraminal or central canal stenosis. L1-L2: No significant disc bulge. No neural foraminal stenosis. No central canal stenosis. L2-L3: No significant disc bulge. No neural foraminal stenosis. No central canal stenosis. L3-L4: No significant disc bulge. No neural foraminal stenosis. No central canal stenosis. L4-L5: No significant disc bulge. No neural foraminal stenosis. No central canal stenosis. L5-S1: No significant disc bulge. No neural foraminal stenosis. No central canal stenosis. IMPRESSION: 1. Findings concerning for early discitis-osteomyelitis at T12-L1 with increase bone marrow edema in the adjacent vertebral body endplates compared with recent thoracic spine MRI dated 04/10/2021. No epidural or paravertebral fluid collection to suggest an abscess. 2. Mild thoracolumbar spondylosis as described above. Electronically Signed   By: Elige Ko M.D.   On: 05/13/2021 06:51   ECHOCARDIOGRAM COMPLETE  Result Date: 05/20/2021    ECHOCARDIOGRAM REPORT   Patient Name:   Duane Price Date of Exam: 05/20/2021 Medical Rec #:  [id number]     Height:       72.0 in Accession #:    [id number]    Weight:       155.2 lb Date of Birth:  08-06-1986     BSA:          1.913 m Patient Age:    35 years      BP:           101/60 mmHg Patient Gender: M             HR:           70 bpm. Exam Location:  Inpatient Procedure: 2D Echo, Cardiac Doppler and Color Doppler Indications:    Endocarditis  History:        Patient has prior history of Echocardiogram  examinations, most  recent 04/25/2021. Endocarditis; Risk Factors:Former Smoker.  Sonographer:    Neomia Dear RDCS Referring Phys: [id number]ayne Shorter M GHIMIRE IMPRESSIONS  1. Small, subcentimeter mobile echodensity on tricuspid valve chords, not significantly changed from prior exam. This may represent vegetation given presence on initial echo 03/26/21. The previously noted valvular vegetation from 04/25/21 is no longer clearly seen, though a very small amount of residual vegetation may be present on atrial aspect of valve vs degenerative change. If clinically pertinent to distinguish, can consider TEE.. Tricuspid valve regurgitation is mild to moderate.  2. Left ventricular ejection fraction, by estimation, is 55 to 60%. The left ventricle has normal function. The left ventricle has no regional wall motion abnormalities. Left ventricular diastolic function was not performed.  3. Right ventricular systolic function is normal. The right ventricular size is mildly enlarged. There is normal pulmonary artery systolic pressure. The estimated right ventricular systolic pressure is 32.8 mmHg.  4. The mitral valve is normal in structure. Trivial mitral valve regurgitation. No evidence of mitral stenosis.  5. The aortic valve is tricuspid. Aortic valve regurgitation is not visualized. No aortic stenosis is present.  6. The inferior vena cava is normal in size with greater than 50% respiratory variability, suggesting right atrial pressure of 3 mmHg. Comparison(s): A prior study was performed on 04/25/21. Prior images reviewed side by side. FINDINGS  Left Ventricle: Left ventricular ejection fraction, by estimation, is 55 to 60%. The left ventricle has normal function. The left ventricle has no regional wall motion abnormalities. The left ventricular internal cavity size was normal in size. There is  no left ventricular hypertrophy. Left ventricular diastolic function could not be evaluated. Right Ventricle: The right  ventricular size is mildly enlarged. No increase in right ventricular wall thickness. Right ventricular systolic function is normal. There is normal pulmonary artery systolic pressure. The tricuspid regurgitant velocity is 2.73  m/s, and with an assumed right atrial pressure of 3 mmHg, the estimated right ventricular systolic pressure is 32.8 mmHg. Left Atrium: Left atrial size was normal in size. Right Atrium: Right atrial size was normal in size. Pericardium: There is no evidence of pericardial effusion. Mitral Valve: The mitral valve is normal in structure. Trivial mitral valve regurgitation. No evidence of mitral valve stenosis. Tricuspid Valve: Small, subcentimeter mobile echodensity on tricuspid valve chords, not significantly changed from prior exam. This may represent vegetation given presence on initial echo 03/26/21. The previously noted valvular vegetation from 04/25/21 is no  longer clearly seen, though a very small amount of residual vegetation may be present on atrial aspect of valve vs degenerative change. If clinically pertinent to distinguish, can consider TEE. The tricuspid valve is normal in structure. Tricuspid valve  regurgitation is mild to moderate. No evidence of tricuspid stenosis. Aortic Valve: The aortic valve is tricuspid. Aortic valve regurgitation is not visualized. No aortic stenosis is present. Aortic valve mean gradient measures 2.0 mmHg. Aortic valve peak gradient measures 4.3 mmHg. Aortic valve area, by VTI measures 3.28 cm. Pulmonic Valve: The pulmonic valve was normal in structure. Pulmonic valve regurgitation is trivial. No evidence of pulmonic stenosis. Aorta: The aortic root is normal in size and structure. Venous: The inferior vena cava is normal in size with greater than 50% respiratory variability, suggesting right atrial pressure of 3 mmHg. IAS/Shunts: No atrial level shunt detected by color flow Doppler.  LEFT VENTRICLE PLAX 2D LVIDd:         4.80 cm LVIDs:         2.80  cm  LV PW:         1.00 cm LV IVS:        1.00 cm LVOT diam:     2.40 cm LV SV:         63 LV SV Index:   33 LVOT Area:     4.52 cm  LV Volumes (MOD) LV vol d, MOD A2C: 79.8 ml LV vol d, MOD A4C: 116.0 ml LV vol s, MOD A2C: 28.7 ml LV vol s, MOD A4C: 48.1 ml LV SV MOD A2C:     51.1 ml LV SV MOD A4C:     116.0 ml LV SV MOD BP:      60.4 ml RIGHT VENTRICLE RV Basal diam:  4.10 cm RV Mid diam:    3.60 cm RV S prime:     12.20 cm/s TAPSE (M-mode): 2.6 cm LEFT ATRIUM             Index       RIGHT ATRIUM           Index LA diam:        2.80 cm 1.46 cm/m  RA Area:     11.80 cm LA Vol (A2C):   65.2 ml 34.08 ml/m RA Volume:   23.10 ml  12.08 ml/m LA Vol (A4C):   46.2 ml 24.15 ml/m LA Biplane Vol: 56.9 ml 29.75 ml/m  AORTIC VALVE                   PULMONIC VALVE AV Area (Vmax):    3.48 cm    PV Vmax:       0.71 m/s AV Area (Vmean):   3.13 cm    PV Vmean:      48.300 cm/s AV Area (VTI):     3.28 cm    PV VTI:        0.116 m AV Vmax:           104.00 cm/s PV Peak grad:  2.0 mmHg AV Vmean:          71.300 cm/s PV Mean grad:  1.0 mmHg AV VTI:            0.193 m AV Peak Grad:      4.3 mmHg AV Mean Grad:      2.0 mmHg LVOT Vmax:         80.10 cm/s LVOT Vmean:        49.300 cm/s LVOT VTI:          0.140 m LVOT/AV VTI ratio: 0.73  AORTA Ao Asc diam: 2.70 cm TRICUSPID VALVE TR Peak grad:   29.8 mmHg TR Vmax:        273.00 cm/s  SHUNTS Systemic VTI:  0.14 m Systemic Diam: 2.40 cm Weston Brass MD Electronically signed by Weston Brass MD Signature Date/Time: 05/20/2021/11:50:53 AM    Final    [place] LT UPPER EXTREM LTD SOFT TISSUE NON VASCULAR  Result Date: 05/20/2021 CLINICAL DATA:  Left volar forearm swelling for the past week. EXAM: ULTRASOUND LEFT UPPER EXTREMITY LIMITED TECHNIQUE: Ultrasound examination of the upper extremity soft tissues was performed in the area of clinical concern. COMPARISON:  None. FINDINGS: Focused ultrasound of the volar left forearm demonstrates no discrete soft tissue mass or fluid collection.  IMPRESSION: 1. Negative. No soft tissue mass or fluid collection. Electronically Signed   By: Obie Dredge M.D.   On: 05/20/2021 21:44   [place] EKG SITE RITE  Result Date: 05/19/2021  If [employer] not attached, placement could not be confirmed due to current cardiac rhythm.  Today   Subjective    Jahlon Baines today has no headache,no chest abdominal pain,no new weakness tingling or numbness, feels much better wants to go home today.     Objective   Blood pressure (!) 95/53, pulse 72, temperature 98.6 F (37 C), temperature source Oral, resp. rate 18, weight 70.4 kg, SpO2 99 %.   Intake/Output Summary (Last 24 hours) at 05/30/2021 0923 Last data filed at 05/30/2021 0421 Gross per 24 hour  Intake 960 ml  Output 1150 ml  Net -190 ml    Exam  Awake Alert, No new F.N deficits, Normal affect [place].AT,PERRAL Supple Neck,No JVD, No cervical lymphadenopathy appriciated.  Symmetrical Chest wall movement, Good air movement bilaterally, CTAB RRR,No Gallops,Rubs or new Murmurs, No Parasternal Heave +ve B.Sounds, Abd Soft, Non tender, No organomegaly appriciated, No rebound -guarding or rigidity. No Cyanosis, Clubbing or edema, No new Rash or bruise   Data Review   CBC w Diff:  Lab Results  Component Value Date   WBC 9.1 05/28/2021   HGB 9.2 (L) 05/28/2021   HCT 29.3 (L) 05/28/2021   PLT 461 (H) 05/28/2021   LYMPHOPCT 7 05/13/2021   MONOPCT 6 05/13/2021   EOSPCT 0 05/13/2021   BASOPCT 0 05/13/2021    CMP:  Lab Results  Component Value Date   NA 134 (L) 05/26/2021   K 3.9 05/26/2021   CL 96 (L) 05/26/2021   CO2 32 05/26/2021   BUN 15 05/26/2021   CREATININE 0.78 05/26/2021   PROT 6.5 05/14/2021   ALBUMIN 2.3 (L) 05/14/2021   BILITOT 0.3 05/14/2021   ALKPHOS 66 05/14/2021   AST 12 (L) 05/14/2021   ALT 11 05/14/2021  .   Total Time in preparing paper work, data evaluation and todays exam - 35 minutes  Susa Raring M.D on 05/30/2021 at 9:23 AM  [facility]

## 2021-05-30 NOTE — Discharge Instructions (Signed)
Follow with Primary MD  in 7 days   Get CBC, CMP, 2 view Chest X ray -  checked next visit within 1 week by Primary MD   Activity: As tolerated with Full fall precautions use walker/cane & assistance as needed  Disposition Home    Diet: Heart Healthy    Special Instructions: If you have smoked or chewed Tobacco  in the last 2 yrs please stop smoking, stop any regular Alcohol  and or any Recreational drug use.  On your next visit with your primary care physician please Get Medicines reviewed and adjusted.  Please request your Prim.MD to go over all Hospital Tests and Procedure/Radiological results at the follow up, please get all Hospital records sent to your Prim MD by signing hospital release before you go home.  If you experience worsening of your admission symptoms, develop shortness of breath, life threatening emergency, suicidal or homicidal thoughts you must seek medical attention immediately by calling 911 or calling your MD immediately  if symptoms less severe.  You Must read complete instructions/literature along with all the possible adverse reactions/side effects for all the Medicines you take and that have been prescribed to you. Take any new Medicines after you have completely understood and accpet all the possible adverse reactions/side effects.      

## 2021-05-30 NOTE — Progress Notes (Signed)
Bus pass provided for patient.   Joaquin Courts, MSW, [facility]

## 2021-05-30 NOTE — Progress Notes (Signed)
PICC removed per protocol. No complications noted.

## 2021-06-06 ENCOUNTER — Telehealth: Payer: Self-pay

## 2021-06-06 NOTE — Telephone Encounter (Signed)
Left VM for Duane Price at [facility] short stay informing her that Dr.Comer has put in orders in Epic for patient. If any other questions/concerns to call our office.   Jahari Wiginton Lesli Albee, CMA

## 2021-06-07 ENCOUNTER — Inpatient Hospital Stay (HOSPITAL_COMMUNITY)
Admit: 2021-06-07 | Discharge: 2021-06-07 | Disposition: A | Discharge status: Home / Self Care | Payer: Self-pay | Attending: Internal Medicine | Admitting: Internal Medicine

## 2021-06-13 ENCOUNTER — Encounter (HOSPITAL_COMMUNITY): Payer: Self-pay | Admitting: Emergency Medicine

## 2021-06-13 ENCOUNTER — Emergency Department (HOSPITAL_COMMUNITY)
Admission: EM | Admit: 2021-06-13 | Discharge: 2021-06-13 | Disposition: A | Discharge status: Home / Self Care | Payer: Self-pay | Attending: Emergency Medicine | Admitting: Emergency Medicine

## 2021-06-13 ENCOUNTER — Other Ambulatory Visit: Payer: Self-pay

## 2021-06-13 ENCOUNTER — Emergency Department (HOSPITAL_COMMUNITY): Payer: Self-pay

## 2021-06-13 DIAGNOSIS — F17210 Nicotine dependence, cigarettes, uncomplicated: Secondary | ICD-10-CM | POA: Insufficient documentation

## 2021-06-13 DIAGNOSIS — M4626 Osteomyelitis of vertebra, lumbar region: Secondary | ICD-10-CM | POA: Insufficient documentation

## 2021-06-13 DIAGNOSIS — M4647 Discitis, unspecified, lumbosacral region: Secondary | ICD-10-CM | POA: Insufficient documentation

## 2021-06-13 LAB — CBC WITH DIFFERENTIAL/PLATELET
Abs Immature Granulocytes: 0.02 10*3/uL (ref 0.00–0.07)
Basophils Absolute: 0 10*3/uL (ref 0.0–0.1)
Basophils Relative: 0 %
Eosinophils Absolute: 0.1 10*3/uL (ref 0.0–0.5)
Eosinophils Relative: 1 %
HCT: 36.8 % — ABNORMAL LOW (ref 39.0–52.0)
Hemoglobin: 11.2 g/dL — ABNORMAL LOW (ref 13.0–17.0)
Immature Granulocytes: 0 %
Lymphocytes Relative: 34 %
Lymphs Abs: 2.8 10*3/uL (ref 0.7–4.0)
MCH: 26.5 pg (ref 26.0–34.0)
MCHC: 30.4 g/dL (ref 30.0–36.0)
MCV: 87 fL (ref 80.0–100.0)
Monocytes Absolute: 0.5 10*3/uL (ref 0.1–1.0)
Monocytes Relative: 6 %
Neutro Abs: 4.8 10*3/uL (ref 1.7–7.7)
Neutrophils Relative %: 59 %
Platelets: 389 10*3/uL (ref 150–400)
RBC: 4.23 MIL/uL (ref 4.22–5.81)
RDW: 14.6 % (ref 11.5–15.5)
WBC: 8.3 10*3/uL (ref 4.0–10.5)
nRBC: 0 % (ref 0.0–0.2)

## 2021-06-13 LAB — SEDIMENTATION RATE: Sed Rate: 77 mm/hr — ABNORMAL HIGH (ref 0–16)

## 2021-06-13 LAB — BASIC METABOLIC PANEL
Anion gap: 12 (ref 5–15)
BUN: 6 mg/dL (ref 6–20)
CO2: 26 mmol/L (ref 22–32)
Calcium: 9.5 mg/dL (ref 8.9–10.3)
Chloride: 99 mmol/L (ref 98–111)
Creatinine, Ser: 0.84 mg/dL (ref 0.61–1.24)
GFR, Estimated: >60 mL/min (ref >60)
Glucose, Bld: 79 mg/dL (ref 70–99)
Potassium: 4.1 mmol/L (ref 3.5–5.1)
Sodium: 137 mmol/L (ref 135–145)

## 2021-06-13 LAB — C-REACTIVE PROTEIN: CRP: 3 mg/dL — ABNORMAL HIGH (ref <1.0)

## 2021-06-13 IMAGING — MR MR THORACIC SPINE WO/W CM
4 of 9 series · 18 of 48 positions shown · IV contrast (20    MULTI)
Comparison: Previous MRIs [DATE] and [DATE].

CLINICAL DATA: Progressive mid to low back pain. On antibiotics for
discitis/osteomyelitis. Evaluate for worsening osteomyelitis or
epidural abscess.

EXAM:
MRI THORACIC AND LUMBAR SPINE WITHOUT AND WITH CONTRAST
TECHNIQUE: Multiplanar and multiecho pulse sequences of the thoracic and lumbar
spine were obtained without and with intravenous contrast.
CONTRAST:  7mL GADAVIST GADOBUTROL 1 MMOL/ML IV SOLN

[Series 5: T2 · sagittal · 3.0mm · 0.62mm/px · 3 of 16 slices shown (1 of 2)]
[im 1/16]
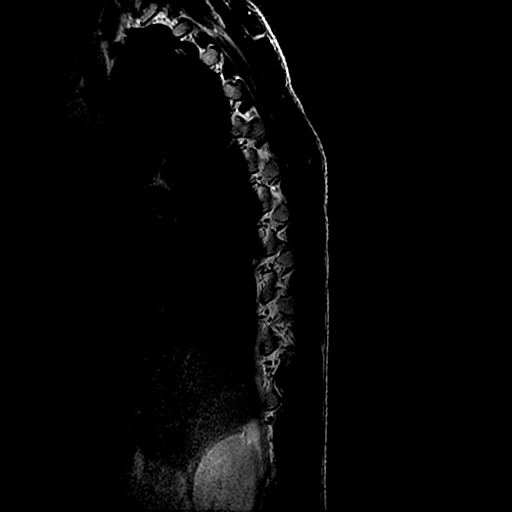
[im 8/16]
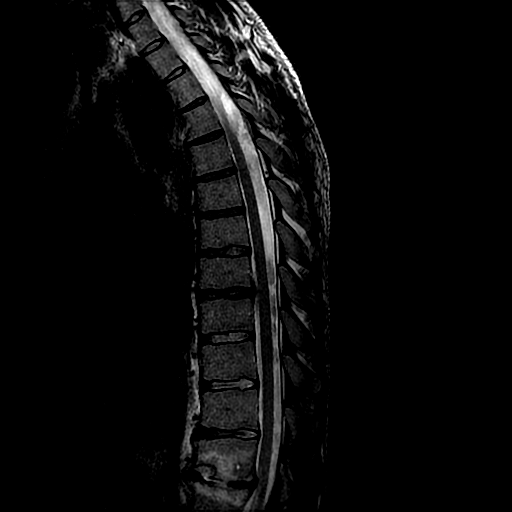
[im 16/16]
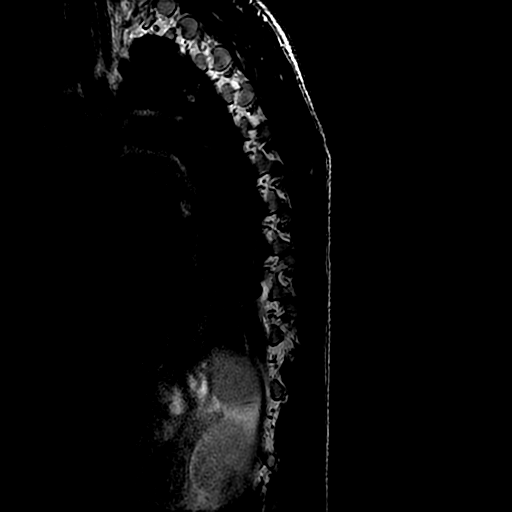

[Series 7: T1 · sagittal · 3.0mm · 0.62mm/px · 4 of 16 slices shown (1 of 2)]
[im 1/16]
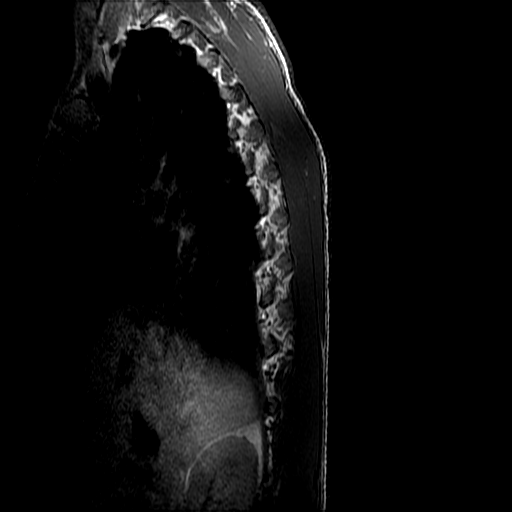
[im 6/16]
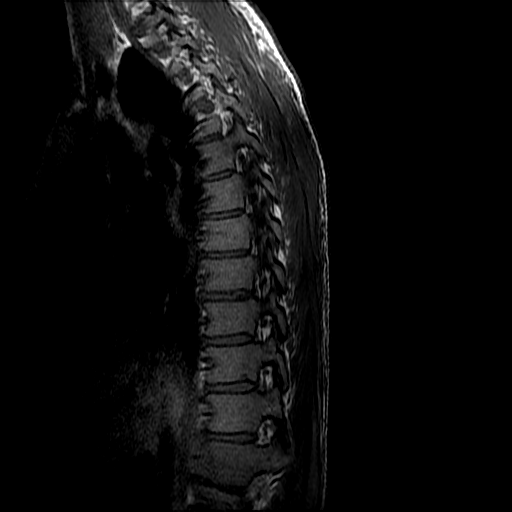
[im 11/16]
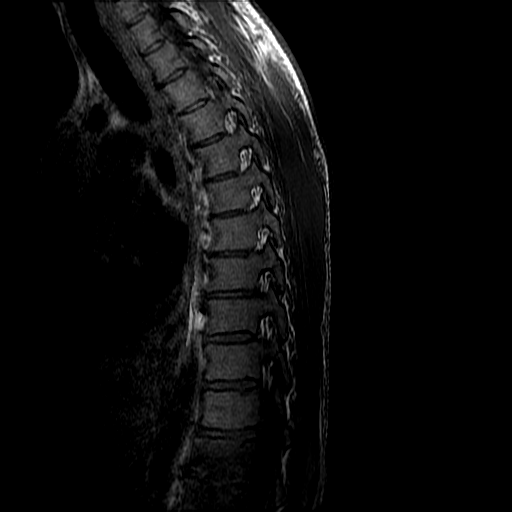
[im 16/16]
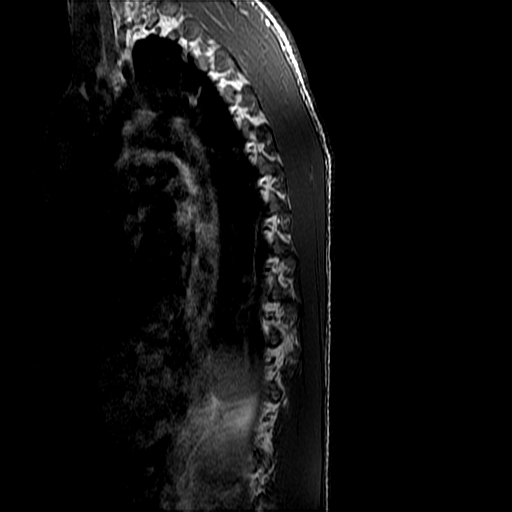

[Series 8: T2 · axial · 4.0mm · 0.43mm/px · z∈[-278,-65]mm · 8 of 36 slices shown (2 of 2)]
[im 1/36]
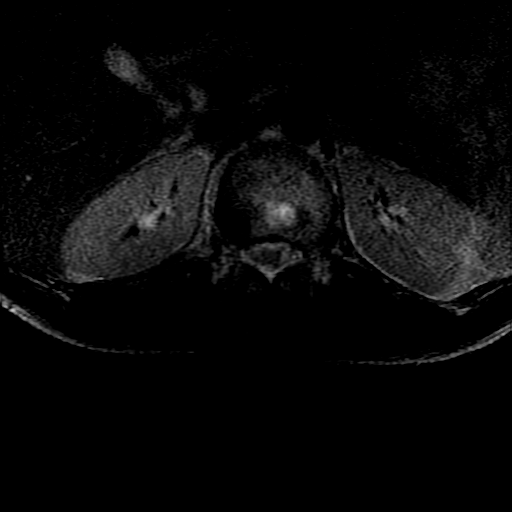
[im 6/36]
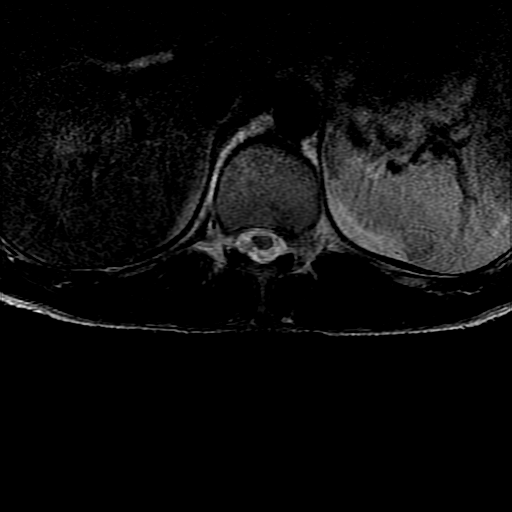
[im 11/36]
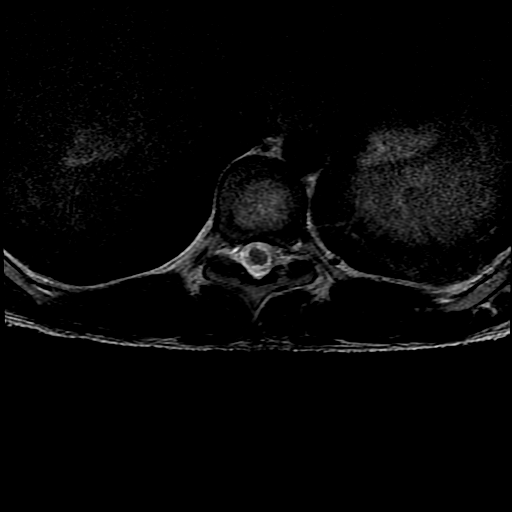
[im 16/36]
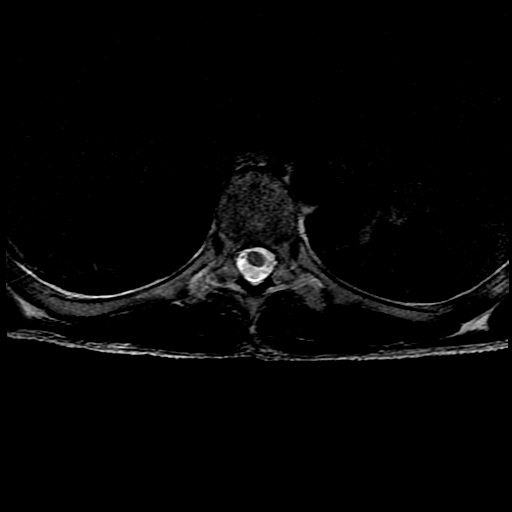
[im 21/36]
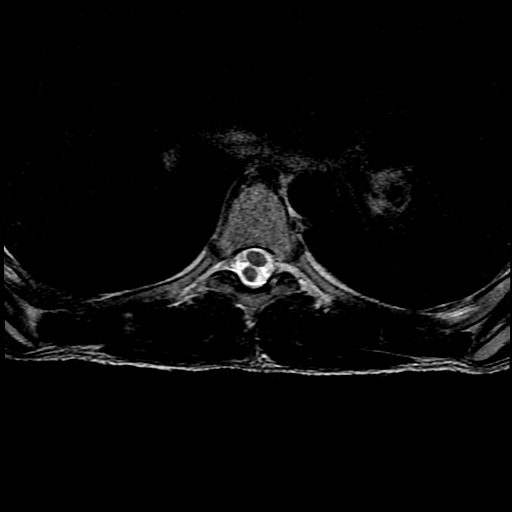
[im 26/36]
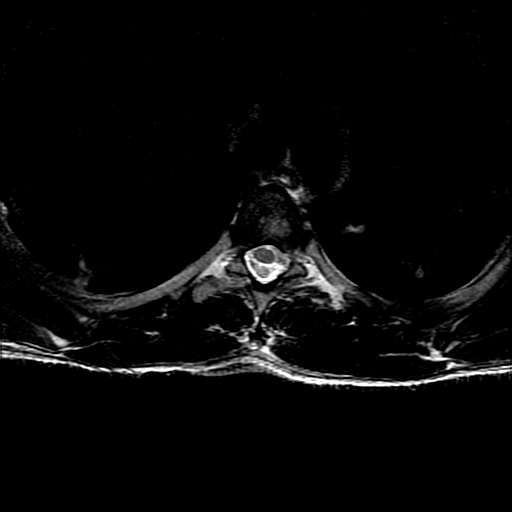
[im 31/36]
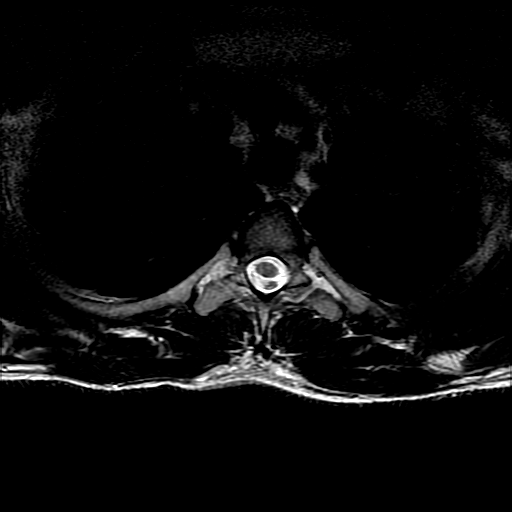
[im 36/36]
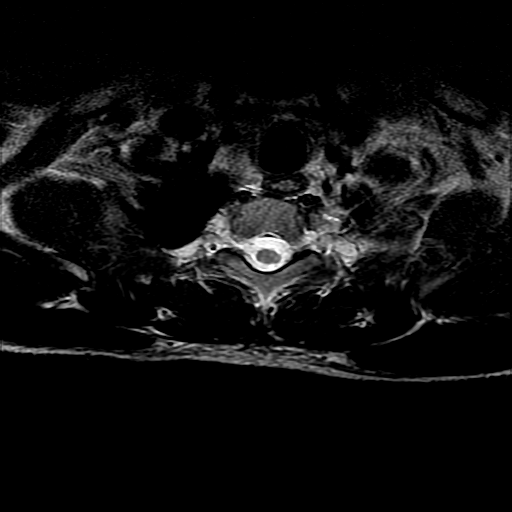

[Series 11: T1 · axial · non-contrast · 4.0mm · 0.43mm/px · z∈[-261,-96]mm · 3 of 36 slices shown (2 of 2)]
[im 6/36]
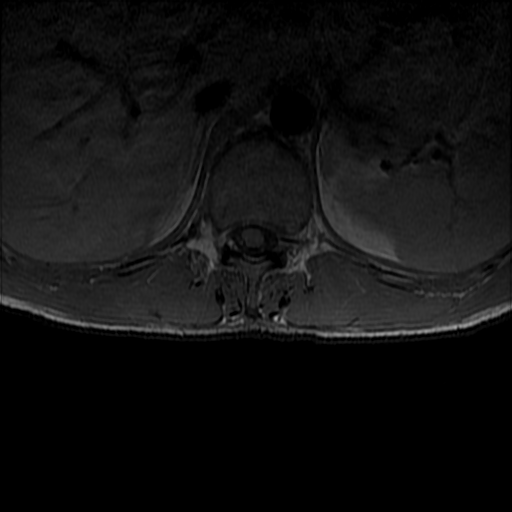
[im 21/36]
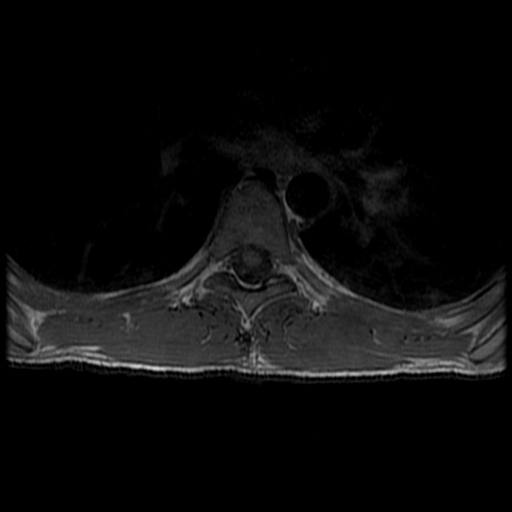
[im 31/36]
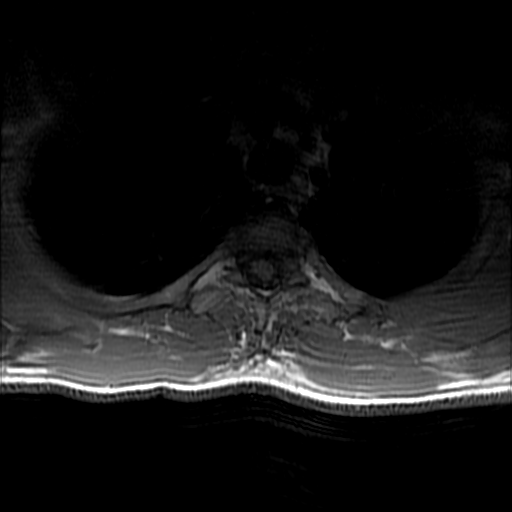

[18 of 48 positions shown; findings below may reference images not displayed]

FINDINGS: MRI THORACIC SPINE FINDINGS

Alignment: Normal.

Vertebrae: Mildly progressive changes of diskitis and osteomyelitis
at T12-L1, further described on the lumbar spine section of the
report. No evidence of additional discitis or osteomyelitis in the
thoracic spine. No acute osseous findings.

Cord: Normal in signal and caliber. No abnormal intradural
enhancement. No evidence of epidural abscess.

Paraspinal and other soft tissues: No paraspinal inflammatory
changes are identified. Multiple cavitary pulmonary lesions are
present bilaterally, probably improved from CT [DATE] and
consistent with septic emboli.

Disc levels:

Mild degenerative changes in the thoracic spine. There is a small
right paracentral disc protrusion at T8-9 and a small left foraminal
disc protrusion at T11-12. No cord deformity or significant
foraminal compromise.

MRI LUMBAR SPINE FINDINGS

Segmentation:  There are 5 lumbar type vertebral bodies.

Alignment:  Normal.

Vertebrae: There are mildly progressive changes of diskitis and
osteomyelitis at T12-L1. There is progressive bone marrow edema,
endplate destruction and marrow enhancement within the T12 and L1
vertebral bodies. No osseous retropulsion or acute fracture. No new
levels of discitis or osteomyelitis identified. The visualized
sacroiliac joints appear unremarkable.

Conus medullaris: Extends to the L1-2 level and appears normal. No
abnormal intradural enhancement.

Paraspinal and other soft tissues: Stable mild paraspinal
inflammatory changes at T12-L1. No evidence of paraspinal or
epidural abscess.

Disc levels:

T12-L1: Stable disc degeneration and a broad-based central disc
protrusion. No cord deformity or significant foraminal compromise.

Disc height and hydration are maintained within the lumbar spine.
There is no evidence of lumbar disc herniation, spinal stenosis or
nerve root encroachment.
IMPRESSION: 1. Mildly progressive changes of diskitis and osteomyelitis at
T12-L1 compared with previous MRIs from 1 month ago.
2. No evidence of paraspinal or epidural abscess.
3. No new levels of disc space infection or osteomyelitis
identified.
4. Sequela of multiple septic emboli in both lungs, improved from
previous CT.

## 2021-06-13 IMAGING — MR MR LUMBAR SPINE WO/W CM
4 of 7 series · 18 of 48 positions shown · IV contrast (20    MULTI)
Comparison: Previous MRIs [DATE] and [DATE].

CLINICAL DATA: Progressive mid to low back pain. On antibiotics for
discitis/osteomyelitis. Evaluate for worsening osteomyelitis or
epidural abscess.

EXAM:
MRI THORACIC AND LUMBAR SPINE WITHOUT AND WITH CONTRAST
TECHNIQUE: Multiplanar and multiecho pulse sequences of the thoracic and lumbar
spine were obtained without and with intravenous contrast.
CONTRAST:  7mL GADAVIST GADOBUTROL 1 MMOL/ML IV SOLN

[Series 13: T2 · sagittal · 4.0mm · 0.55mm/px · 3 of 14 slices shown (1 of 2)]
[im 1/14]
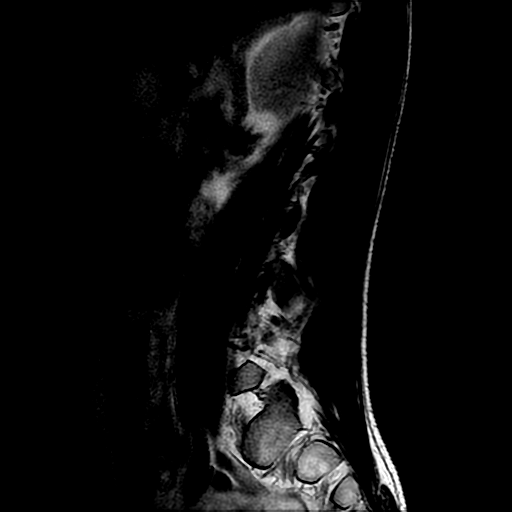
[im 7/14]
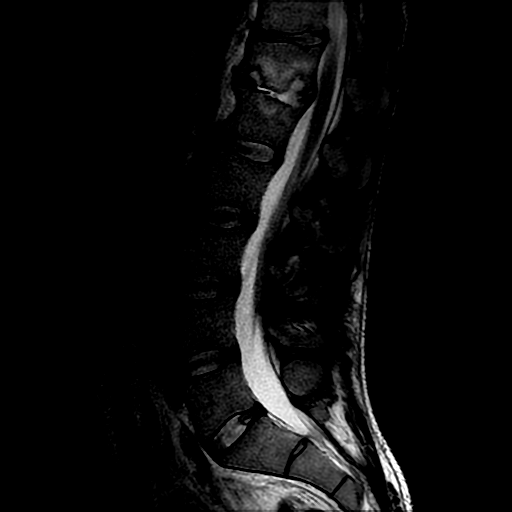
[im 14/14]
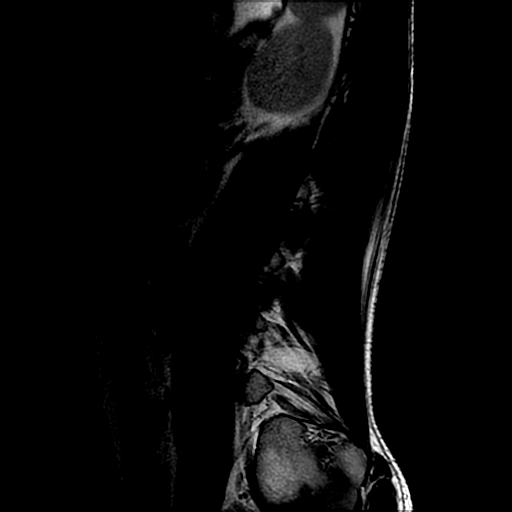

[Series 14: T1 · sagittal · 4.0mm · 0.55mm/px · 3 of 14 slices shown (1 of 2)]
[im 1/14]
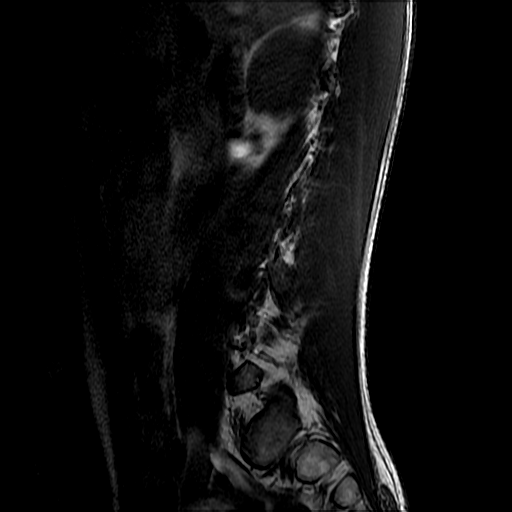
[im 7/14]
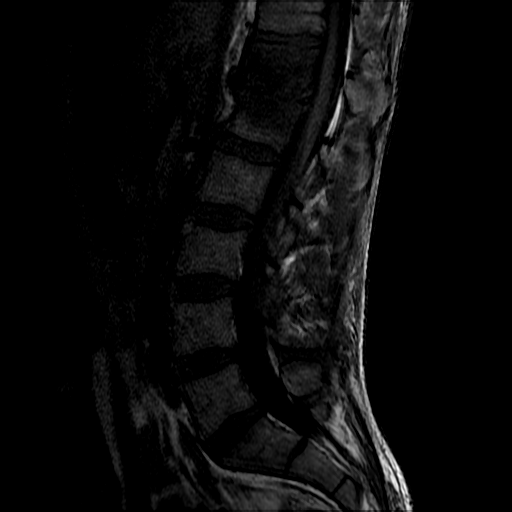
[im 14/14]
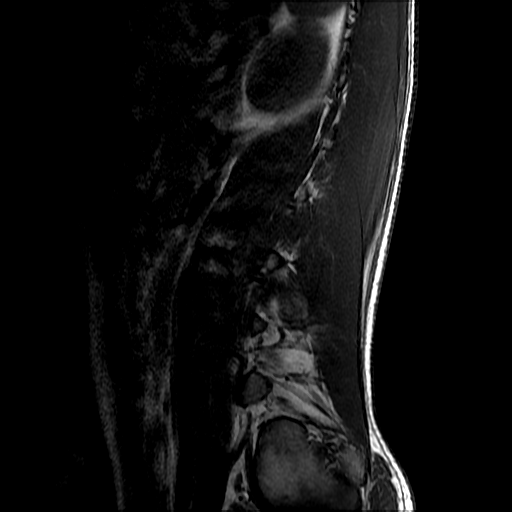

[Series 16: T2 · axial · 4.0mm · 0.39mm/px · z∈[-521,-265]mm · 9 of 48 slices shown (2 of 2)]
[im 1/48]
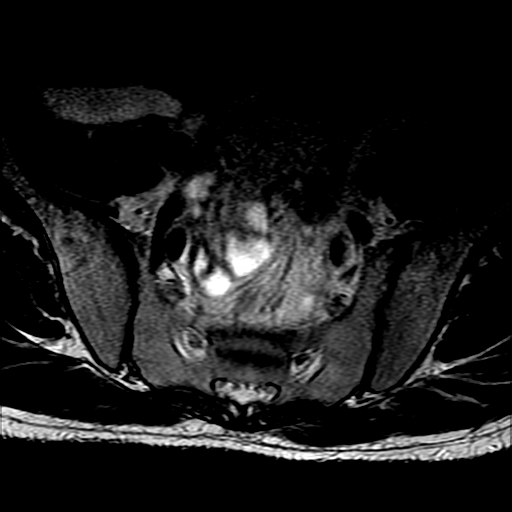
[im 9/48]
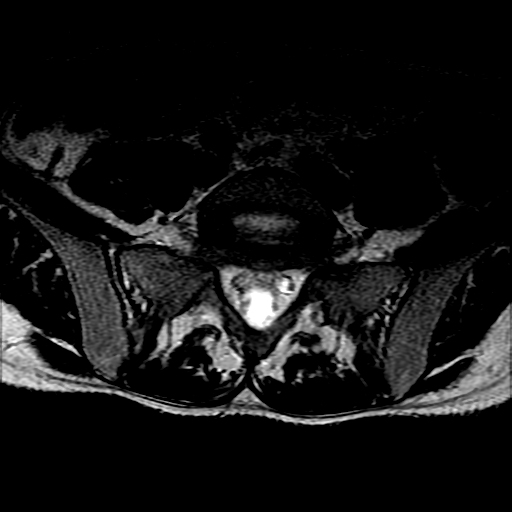
[im 13/48]
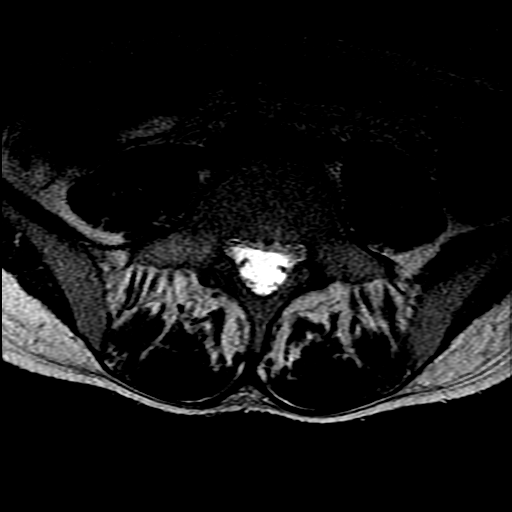
[im 22/48]
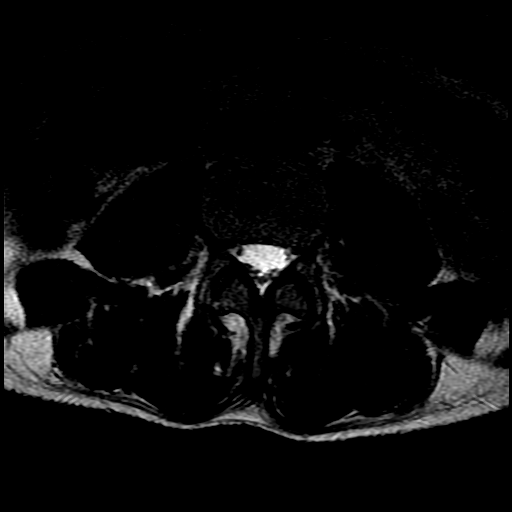
[im 26/48]
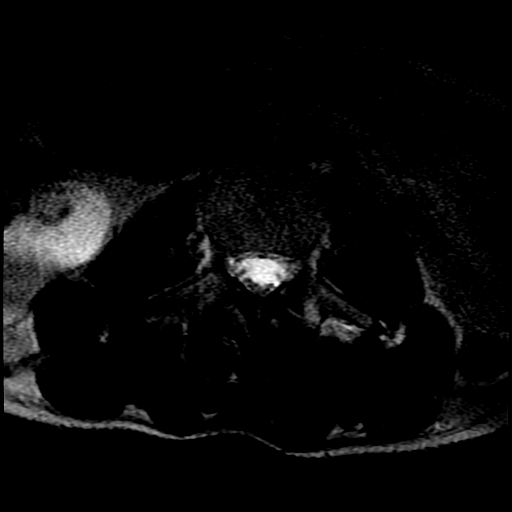
[im 35/48]
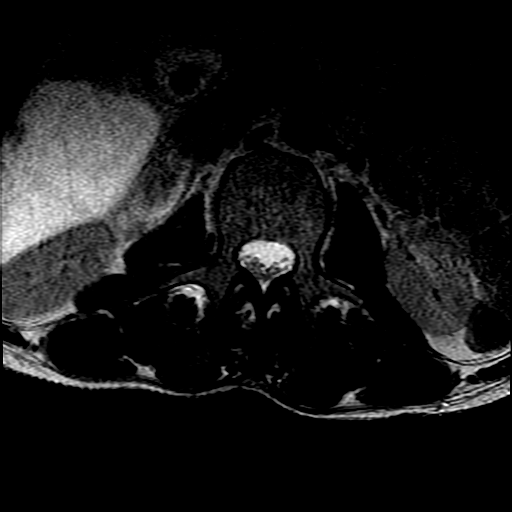
[im 39/48]
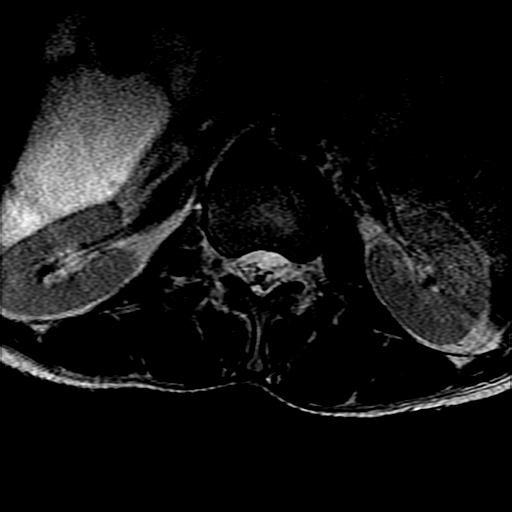
[im 43/48]
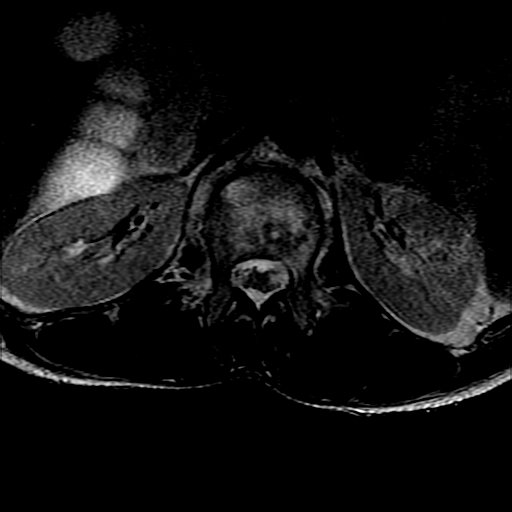
[im 48/48]
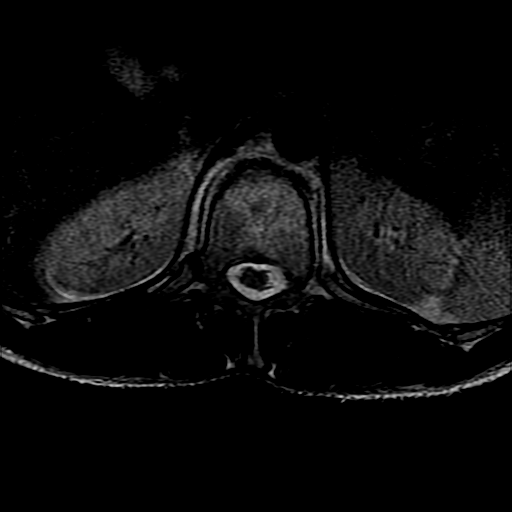

[Series 17: T1 · axial · 4.0mm · 0.39mm/px · z∈[-481,-289]mm · 3 of 48 slices shown (2 of 2)]
[im 9/48]
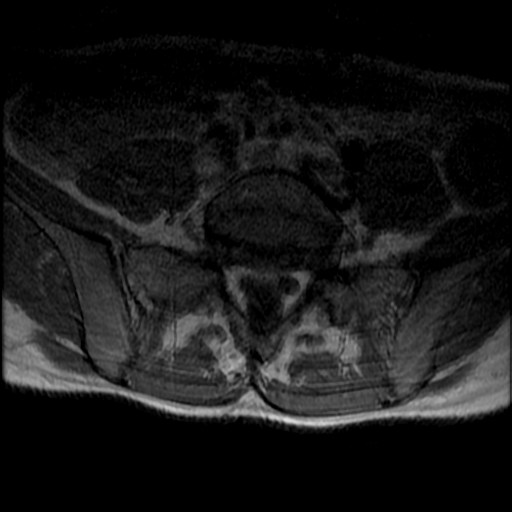
[im 26/48]
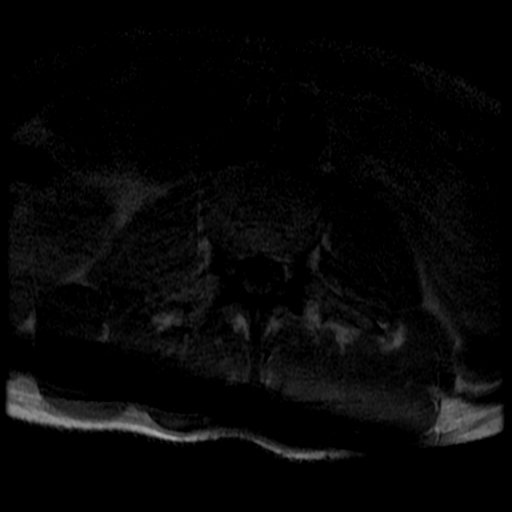
[im 43/48]
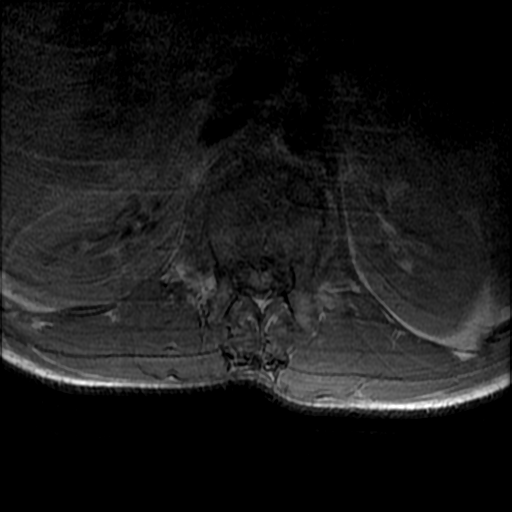

[18 of 48 positions shown; findings below may reference images not displayed]

FINDINGS: MRI THORACIC SPINE FINDINGS

Alignment: Normal.

Vertebrae: Mildly progressive changes of diskitis and osteomyelitis
at T12-L1, further described on the lumbar spine section of the
report. No evidence of additional discitis or osteomyelitis in the
thoracic spine. No acute osseous findings.

Cord: Normal in signal and caliber. No abnormal intradural
enhancement. No evidence of epidural abscess.

Paraspinal and other soft tissues: No paraspinal inflammatory
changes are identified. Multiple cavitary pulmonary lesions are
present bilaterally, probably improved from CT [DATE] and
consistent with septic emboli.

Disc levels:

Mild degenerative changes in the thoracic spine. There is a small
right paracentral disc protrusion at T8-9 and a small left foraminal
disc protrusion at T11-12. No cord deformity or significant
foraminal compromise.

MRI LUMBAR SPINE FINDINGS

Segmentation:  There are 5 lumbar type vertebral bodies.

Alignment:  Normal.

Vertebrae: There are mildly progressive changes of diskitis and
osteomyelitis at T12-L1. There is progressive bone marrow edema,
endplate destruction and marrow enhancement within the T12 and L1
vertebral bodies. No osseous retropulsion or acute fracture. No new
levels of discitis or osteomyelitis identified. The visualized
sacroiliac joints appear unremarkable.

Conus medullaris: Extends to the L1-2 level and appears normal. No
abnormal intradural enhancement.

Paraspinal and other soft tissues: Stable mild paraspinal
inflammatory changes at T12-L1. No evidence of paraspinal or
epidural abscess.

Disc levels:

T12-L1: Stable disc degeneration and a broad-based central disc
protrusion. No cord deformity or significant foraminal compromise.

Disc height and hydration are maintained within the lumbar spine.
There is no evidence of lumbar disc herniation, spinal stenosis or
nerve root encroachment.
IMPRESSION: 1. Mildly progressive changes of diskitis and osteomyelitis at
T12-L1 compared with previous MRIs from 1 month ago.
2. No evidence of paraspinal or epidural abscess.
3. No new levels of disc space infection or osteomyelitis
identified.
4. Sequela of multiple septic emboli in both lungs, improved from
previous CT.

## 2021-06-13 MED ORDER — DIPHENHYDRAMINE HCL 25 MG PO CAPS
25.0000 mg | ORAL_CAPSULE | Freq: Once | ORAL | Status: AC
  Administered 2021-06-13: 25 mg via ORAL
  Filled 2021-06-13: qty 1

## 2021-06-13 MED ORDER — GADOBUTROL 1 MMOL/ML IV SOLN
7.0000 mL | Freq: Once | INTRAVENOUS | Status: AC | PRN
  Administered 2021-06-13: 7 mL via INTRAVENOUS

## 2021-06-13 NOTE — ED Provider Notes (Addendum)
[facility] EMERGENCY DEPARTMENT Provider Note   CSN: [id number] Arrival date & time: 06/13/21  1047     History No chief complaint on file.   Duane Price is a 35 y.o. male with a past medical history of heroin abuse, MRSA bacteremia and tricuspid valve endocarditis with a complaint of lower back pain.  Patient was admitted to the hospital on the 8/19 for lower back pain and diagnosed with osteomyelitis/discitis at T12-L1.  Patient was discharged on 9/5 and was supposed to begin OP abx infusions.  He states that he used heroin twice after his discharge because he was going through terrible withdrawals and was unable to get in with the methadone clinic. Missed his abx infusions furing this time. Patient reports he is currently seeing the methadone clinic and receiving 50mg .  He denies any fevers or chills today.  Endorses a lump to his right lower back.  Patient also complaining of itching to his right hand after popping multiple blisters in the area. 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   Septic embolism (HCC) 03/25/2021   Normocytic anemia 03/25/2021   IV drug abuse (HCC) 03/25/2021    Past Surgical History:  Procedure Laterality Date   APPLICATION OF ANGIOVAC N/A 03/29/2021   Procedure: APPLICATION OF ANGIOVAC;  Surgeon: 05/30/2021, MD;  Location: [facility] OR;  Service: Vascular;  Laterality: N/A;   BRAIN SURGERY     facial surgery          Family History  Family history unknown: Yes    Social History   Tobacco Use   Smoking status: Every Day    Packs/day: 0.50    Types: Cigarettes   Smokeless tobacco: Never  Vaping Use   Vaping Use: Never used  Substance Use Topics   Alcohol use: Not Currently    Comment: Per patient   Drug use: Yes    Types: Cocaine, IV    Comment: Heroin    Home Medications Prior to Admission medications   Medication Sig Start Date End Date Taking? Authorizing Provider  apixaban (ELIQUIS) 5 MG TABS tablet Take 1 tablet (5 mg total) by mouth 2 (two) times daily. Patient not taking: Reported on 06/13/2021 04/25/21   06/25/21, MD  hydrOXYzine (ATARAX/VISTARIL) 25 MG tablet Take 1 tablet (25 mg total) by mouth 3 (three) times daily as needed for anxiety. Patient not taking: Reported on 06/13/2021 04/22/21   04/24/21, MD  pantoprazole (PROTONIX) 40 MG tablet Take 1 tablet (40 mg total) by mouth daily. Patient not taking: Reported on 06/13/2021 04/23/21 06/13/21  06/15/21, MD    Allergies    Patient has no known allergies.  Review of Systems   Review of Systems  Constitutional:  Negative for chills, diaphoresis and fever.  Genitourinary:  Negative for decreased urine volume, difficulty urinating, dysuria and hematuria.  Musculoskeletal:  Positive for back pain. Negative for gait problem and joint swelling.  Skin:  Positive for wound. Negative for rash.  Neurological:  Negative for dizziness, weakness, numbness and headaches.  All other systems reviewed and are negative.  Physical Exam Updated Vital Signs BP 116/70   Pulse 93   Temp 97.9 F (36.6 C)   Resp 18   SpO2 100%   Physical Exam Vitals and nursing note reviewed.  Constitutional:      General: He is not in acute distress.    Appearance: Normal appearance. He is not diaphoretic.  HENT:     Head: Normocephalic and atraumatic.  Eyes:     General: No scleral icterus.    Conjunctiva/sclera: Conjunctivae  normal.     Pupils: Pupils are equal, round, and reactive to light.  Cardiovascular:     Rate and Rhythm: Normal rate and regular rhythm.     Heart sounds: No murmur heard. Pulmonary:     Effort: Pulmonary effort is normal. No respiratory distress.     Breath sounds: Normal breath sounds.  Abdominal:     General: Abdomen is flat.     Palpations: Abdomen is soft.     Tenderness: There is no abdominal tenderness.  Musculoskeletal:        General: Swelling (Right paraspinal soft tissue inflammation at L5. No sign of superficial infection) present.  Skin:    General: Skin is warm and dry.     Findings: Lesion (Patient with multiple scabbed lesions to his right dorsal hand and wrist.) present. No rash.  Neurological:     Mental Status: He is alert.  Psychiatric:        Mood and Affect: Mood normal.        Behavior: Behavior normal.    ED Results / Procedures / Treatments   Labs (all labs ordered are listed, but only abnormal results are displayed) Labs Reviewed  CBC WITH DIFFERENTIAL/PLATELET - Abnormal; Notable for the following components:      Result Value   Hemoglobin 11.2 (*)    HCT 36.8 (*)    All other components within normal limits  SEDIMENTATION RATE - Abnormal; Notable for the following components:   Sed Rate 77 (*)    All other components within normal limits  CULTURE, BLOOD (ROUTINE X 2)  CULTURE, BLOOD (ROUTINE X 2)  BASIC METABOLIC PANEL  URINALYSIS, ROUTINE W REFLEX MICROSCOPIC  C-REACTIVE PROTEIN    EKG None  Radiology MR THORACIC SPINE W WO CONTRAST  Result Date: 06/13/2021 CLINICAL DATA:  Progressive mid to low back pain. On antibiotics for discitis/osteomyelitis. Evaluate for worsening osteomyelitis or epidural abscess. EXAM: MRI THORACIC AND LUMBAR SPINE WITHOUT AND WITH CONTRAST TECHNIQUE: Multiplanar and multiecho pulse sequences of the thoracic and lumbar spine were obtained without and with intravenous contrast. CONTRAST:  7mL GADAVIST GADOBUTROL 1  MMOL/ML IV SOLN COMPARISON:  Previous MRIs 04/10/2021 and 05/13/2021. FINDINGS: MRI THORACIC SPINE FINDINGS Alignment: Normal. Vertebrae: Mildly progressive changes of diskitis and osteomyelitis at T12-L1, further described on the lumbar spine section of the report. No evidence of additional discitis or osteomyelitis in the thoracic spine. No acute osseous findings. Cord: Normal in signal and caliber. No abnormal intradural enhancement. No evidence of epidural abscess. Paraspinal and other soft tissues: No paraspinal inflammatory changes are identified. Multiple cavitary pulmonary lesions are present bilaterally, probably improved from CT 04/12/2021 and consistent with septic emboli. Disc levels: Mild degenerative changes in the thoracic spine. There is a small right paracentral disc protrusion at T8-9 and a small left foraminal disc protrusion at T11-12. No  cord deformity or significant foraminal compromise. MRI LUMBAR SPINE FINDINGS Segmentation:  There are 5 lumbar type vertebral bodies. Alignment:  Normal. Vertebrae: There are mildly progressive changes of diskitis and osteomyelitis at T12-L1. There is progressive bone marrow edema, endplate destruction and marrow enhancement within the T12 and L1 vertebral bodies. No osseous retropulsion or acute fracture. No new levels of discitis or osteomyelitis identified. The visualized sacroiliac joints appear unremarkable. Conus medullaris: Extends to the L1-2 level and appears normal. No abnormal intradural enhancement. Paraspinal and other soft tissues: Stable mild paraspinal inflammatory changes at T12-L1. No evidence of paraspinal or epidural abscess. Disc levels: T12-L1: Stable disc degeneration and a broad-based central disc protrusion. No cord deformity or significant foraminal compromise. Disc height and hydration are maintained within the lumbar spine. There is no evidence of lumbar disc herniation, spinal stenosis or nerve root encroachment. IMPRESSION: 1.  Mildly progressive changes of diskitis and osteomyelitis at T12-L1 compared with previous MRIs from 1 month ago. 2. No evidence of paraspinal or epidural abscess. 3. No new levels of disc space infection or osteomyelitis identified. 4. Sequela of multiple septic emboli in both lungs, improved from previous CT. Electronically Signed   By: Carey Bullocks M.D.   On: 06/13/2021 15:05   MR Lumbar Spine W Wo Contrast  Result Date: 06/13/2021 CLINICAL DATA:  Progressive mid to low back pain. On antibiotics for discitis/osteomyelitis. Evaluate for worsening osteomyelitis or epidural abscess. EXAM: MRI THORACIC AND LUMBAR SPINE WITHOUT AND WITH CONTRAST TECHNIQUE: Multiplanar and multiecho pulse sequences of the thoracic and lumbar spine were obtained without and with intravenous contrast. CONTRAST:  72mL GADAVIST GADOBUTROL 1 MMOL/ML IV SOLN COMPARISON:  Previous MRIs 04/10/2021 and 05/13/2021. FINDINGS: MRI THORACIC SPINE FINDINGS Alignment: Normal. Vertebrae: Mildly progressive changes of diskitis and osteomyelitis at T12-L1, further described on the lumbar spine section of the report. No evidence of additional discitis or osteomyelitis in the thoracic spine. No acute osseous findings. Cord: Normal in signal and caliber. No abnormal intradural enhancement. No evidence of epidural abscess. Paraspinal and other soft tissues: No paraspinal inflammatory changes are identified. Multiple cavitary pulmonary lesions are present bilaterally, probably improved from CT 04/12/2021 and consistent with septic emboli. Disc levels: Mild degenerative changes in the thoracic spine. There is a small right paracentral disc protrusion at T8-9 and a small left foraminal disc protrusion at T11-12. No cord deformity or significant foraminal compromise. MRI LUMBAR SPINE FINDINGS Segmentation:  There are 5 lumbar type vertebral bodies. Alignment:  Normal. Vertebrae: There are mildly progressive changes of diskitis and osteomyelitis at T12-L1.  There is progressive bone marrow edema, endplate destruction and marrow enhancement within the T12 and L1 vertebral bodies. No osseous retropulsion or acute fracture. No new levels of discitis or osteomyelitis identified. The visualized sacroiliac joints appear unremarkable. Conus medullaris: Extends to the L1-2 level and appears normal. No abnormal intradural enhancement. Paraspinal and other soft tissues: Stable mild paraspinal inflammatory changes at T12-L1. No evidence of paraspinal or epidural abscess. Disc levels: T12-L1: Stable disc degeneration and a broad-based central disc protrusion. No cord deformity or significant foraminal compromise. Disc height and hydration are maintained within the lumbar spine. There is no evidence of lumbar disc herniation, spinal stenosis or nerve root encroachment. IMPRESSION: 1. Mildly progressive changes of diskitis and osteomyelitis at T12-L1 compared with previous MRIs from 1 month ago. 2. No evidence of paraspinal or epidural abscess. 3. No new levels of disc space infection or osteomyelitis identified. 4. Sequela of multiple septic emboli in both  lungs, improved from previous CT. Electronically Signed   By: Carey Bullocks M.D.   On: 06/13/2021 15:05     Procedures Procedures   Medications Ordered in ED Medications  diphenhydrAMINE (BENADRYL) capsule 25 mg (25 mg Oral Given 06/13/21 1235)  gadobutrol (GADAVIST) 1 MMOL/ML injection 7 mL (7 mLs Intravenous Contrast Given 06/13/21 1443)    ED Course  I have reviewed the triage vital signs and the nursing notes.  Pertinent labs & imaging results that were available during my care of the patient were reviewed by me and considered in my medical decision making (see chart for details).    MDM Rules/Calculators/A&P Duane Price is a 35 y.o. male with a past medical history of heroin abuse, MRSA bacteremia and tricuspid valve endocarditis with a complaint of lower back pain.  Patient was admitted to the hospital  on the 8/19 for lower back pain and diagnosed with osteomyelitis/discitis at T12-L1.  Patient was discharged on 9/5 and began outpatient IV Vanc infusions.  He states that he used heroin twice after his discharge because he was going through terrible withdrawals and was unable to get in with the methadone clinic.  Patient reports he is currently seeing the methadone clinic and receiving 50mg .  He denies any fevers or chills today.  Endorses a lump to his right lower back.  Patient also complaining of itching to his right hand after popping multiple blisters in the area.  Patient's MRI revealed progressive changes of his discitis and osteomyelitis at T12-L1. The patient denied missing any of his outpatient infusion sessions.  Because of patient's continuing symptoms and worsening MRI I believe that he may need broader spectrum antibiotics.  I have consulted ID and MD Vu reported that the progressive nature of the osteomyelitis/discitis is expected.  If patient is without neurological symptoms he recommends obtaining a CRP and discharging the patient home for continued infusions.  When I relayed this information to the patient and he reported that he has not been attending any of his outpatient infusions.  Reports that he was told to come back to the emergency department instead.  Per previous documentation the patient was to attend [facility] Short [facility] for infusions from 9/13-9/20 and follow-up with MD Comer from ID for his diagnoses.  I sent a message to MD Comer to make him aware of the situation.  Patient continues without signs of systemic infection. Does not meet sepsis or sirs criteria. Patient not tachycardic or tachypneic.  Blood pressure stable throughout his visit.  Imaging revealed no sign of abscess.  I do not believe the patient needs to be admitted to the hospital for acute care.  I believe that he is able to follow-up with infectious disease on Wednesday as planned.  I discussed this with the patient and  he is agreeable to this plan and reports that he is no longer using IV drugs and intends to continue with the methadone clinic.  His hand lesions have scabbed over at this point.  Likely secondary to IV drug use in this area on his dorsal hand/wrist.  No bruising or bleeding.  I will encourage the patient to stop using IV drugs and to avoid scratching the area.   Final Clinical Impression(s) / ED Diagnoses Final diagnoses:  Discitis of lumbosacral region  Osteomyelitis of low back (HCC)    Rx / DC Orders Results and diagnoses were explained to the patient. Return precautions discussed in full and attached to his discharge papers.  We discussed  the importance of following up with infectious disease and that his condition will continue to worsen if he does not get antibiotic treatment. Patient had no additional questions and expressed complete understanding.   Saddie Benders, PA-C 06/13/21 1841    Woodroe Chen 06/13/21 1849    Milagros Loll, MD 06/20/21 1034

## 2021-06-13 NOTE — ED Triage Notes (Signed)
Pt here today with c/o back pain , has used heroin once since he was discharge and is due for his antibiotics at short stay tomorrow

## 2021-06-13 NOTE — ED Notes (Signed)
Made aware of need for urine sample

## 2021-06-13 NOTE — ED Notes (Signed)
Labs drawn and sent. Pt has no requests at this time

## 2021-06-13 NOTE — ED Notes (Signed)
Patient transported to MRI 

## 2021-06-13 NOTE — Discharge Instructions (Addendum)
Your work-up is reassuring today.  Infectious disease did not think that you need to be admitted to the hospital however please do not miss your follow-up appointment with infectious disease on Wednesday to discuss your missed antibiotic treatment days. Their office information is attached to your discharge papers.  It is important that you stop using intravenous drugs.  This is putting you at higher risk for these infections.  Continue your treatment with the methadone clinic.  Information's about osteomyelitis attached to your discharge papers.  Please review these for a better understanding of your condition.  It was a pleasure to meet you and I hope that you feel better.

## 2021-06-13 NOTE — ED Notes (Signed)
Back from MRI.

## 2021-06-14 ENCOUNTER — Inpatient Hospital Stay (HOSPITAL_COMMUNITY): Admit: 2021-06-14 | Payer: Self-pay

## 2021-06-15 ENCOUNTER — Ambulatory Visit (INDEPENDENT_AMBULATORY_CARE_PROVIDER_SITE_OTHER): Payer: Self-pay | Admitting: Internal Medicine

## 2021-06-15 ENCOUNTER — Telehealth: Payer: Self-pay

## 2021-06-15 ENCOUNTER — Encounter: Payer: Self-pay | Admitting: Internal Medicine

## 2021-06-15 ENCOUNTER — Other Ambulatory Visit: Payer: Self-pay

## 2021-06-15 VITALS — BP 104/68 | HR 90 | Temp 98.0°F | Ht 72.0 in | Wt 154.0 lb

## 2021-06-15 DIAGNOSIS — F1910 Other psychoactive substance abuse, uncomplicated: Secondary | ICD-10-CM

## 2021-06-15 DIAGNOSIS — M4645 Discitis, unspecified, thoracolumbar region: Secondary | ICD-10-CM

## 2021-06-15 DIAGNOSIS — Z87898 Personal history of other specified conditions: Secondary | ICD-10-CM

## 2021-06-15 NOTE — Assessment & Plan Note (Signed)
Repeat blood cultures sent in the ED and no growth to date.

## 2021-06-15 NOTE — Telephone Encounter (Signed)
RN faxed signed orders to [facility] short stay per Dr. Luciana Axe.   Orders are for: 06-22-2021: dalvance IV 1 gram once 06-29-2021: dalvance IV 500 mg once   Patient made aware of first appointment, short stay to schedule second appointment when patient shows for first dose.   Sandie Ano, RN

## 2021-06-15 NOTE — Progress Notes (Signed)
   Subjective:    Patient ID: Duane Price, male    DOB: September 25, 1986, 35 y.o.   MRN: [id number]  HPI Here for follow up of discitis/osteomyelitis.  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  He had returned to the hospital with low back pain and found thediscitis.  He was treated with IV vancomycin through 05/30/21 with the plan to continue with IV dalbavancin x 2 weeks after receiving a dose of oritavancin just before discharge.      He missed the short stay appt and then presented to the ED with recurrence of the back pain.  MRI was repeated and some mild progression.  ESR and CRP stable compared to previous.  His main complaint now is pain. He is doing well on methadone.     Review of Systems  Constitutional:  Negative for chills, fatigue and fever.  Gastrointestinal:  Negative for diarrhea and nausea.  Skin:  Negative for rash.      Objective:   Physical Exam Eyes:     General: No scleral icterus. Pulmonary:     Effort: Pulmonary effort is normal.  Skin:    Comments: Scarred lesions on right hand, dried scab, no drainage, no significant erythema  Neurological:     General: No focal deficit present.     Mental Status: He is alert.   Sh: remains drug free since discharge except for one relapse before he could get on methadone       Assessment & Plan:

## 2021-06-15 NOTE — Assessment & Plan Note (Addendum)
He remains drug free and I encouraged him to continue on methadone.

## 2021-06-15 NOTE — Assessment & Plan Note (Signed)
I will have his dalbavancin doses rescheduled asap.  Recent CRP, ESR and MRI noted and no significant concerns

## 2021-06-17 ENCOUNTER — Emergency Department (HOSPITAL_COMMUNITY)
Admission: EM | Admit: 2021-06-17 | Discharge: 2021-06-17 | Disposition: A | Discharge status: Home / Self Care | Payer: Self-pay | Attending: Emergency Medicine | Admitting: Emergency Medicine

## 2021-06-17 ENCOUNTER — Encounter (HOSPITAL_COMMUNITY): Payer: Self-pay

## 2021-06-17 DIAGNOSIS — M545 Low back pain, unspecified: Secondary | ICD-10-CM | POA: Insufficient documentation

## 2021-06-17 DIAGNOSIS — Z5900 Homelessness unspecified: Secondary | ICD-10-CM | POA: Insufficient documentation

## 2021-06-17 DIAGNOSIS — F17210 Nicotine dependence, cigarettes, uncomplicated: Secondary | ICD-10-CM | POA: Insufficient documentation

## 2021-06-17 LAB — CBC WITH DIFFERENTIAL/PLATELET
Abs Immature Granulocytes: 0.02 10*3/uL (ref 0.00–0.07)
Basophils Absolute: 0 10*3/uL (ref 0.0–0.1)
Basophils Relative: 0 %
Eosinophils Absolute: 0.1 10*3/uL (ref 0.0–0.5)
Eosinophils Relative: 1 %
HCT: 35.4 % — ABNORMAL LOW (ref 39.0–52.0)
Hemoglobin: 11.2 g/dL — ABNORMAL LOW (ref 13.0–17.0)
Immature Granulocytes: 0 %
Lymphocytes Relative: 33 %
Lymphs Abs: 2.7 10*3/uL (ref 0.7–4.0)
MCH: 26.7 pg (ref 26.0–34.0)
MCHC: 31.6 g/dL (ref 30.0–36.0)
MCV: 84.3 fL (ref 80.0–100.0)
Monocytes Absolute: 0.5 10*3/uL (ref 0.1–1.0)
Monocytes Relative: 6 %
Neutro Abs: 4.9 10*3/uL (ref 1.7–7.7)
Neutrophils Relative %: 60 %
Platelets: 410 10*3/uL — ABNORMAL HIGH (ref 150–400)
RBC: 4.2 MIL/uL — ABNORMAL LOW (ref 4.22–5.81)
RDW: 13.9 % (ref 11.5–15.5)
WBC: 8.2 10*3/uL (ref 4.0–10.5)
nRBC: 0 % (ref 0.0–0.2)

## 2021-06-17 LAB — LACTIC ACID, PLASMA: Lactic Acid, Venous: 1.8 mmol/L (ref 0.5–1.9)

## 2021-06-17 LAB — COMPREHENSIVE METABOLIC PANEL
ALT: 9 U/L (ref 0–44)
AST: 14 U/L — ABNORMAL LOW (ref 15–41)
Albumin: 3.4 g/dL — ABNORMAL LOW (ref 3.5–5.0)
Alkaline Phosphatase: 100 U/L (ref 38–126)
Anion gap: 9 (ref 5–15)
BUN: 5 mg/dL — ABNORMAL LOW (ref 6–20)
CO2: 28 mmol/L (ref 22–32)
Calcium: 9.3 mg/dL (ref 8.9–10.3)
Chloride: 96 mmol/L — ABNORMAL LOW (ref 98–111)
Creatinine, Ser: 0.77 mg/dL (ref 0.61–1.24)
GFR, Estimated: >60 mL/min (ref >60)
Glucose, Bld: 94 mg/dL (ref 70–99)
Potassium: 4.1 mmol/L (ref 3.5–5.1)
Sodium: 133 mmol/L — ABNORMAL LOW (ref 135–145)
Total Bilirubin: 0.3 mg/dL (ref 0.3–1.2)
Total Protein: 8.8 g/dL — ABNORMAL HIGH (ref 6.5–8.1)

## 2021-06-17 MED ORDER — IBUPROFEN 800 MG PO TABS
800.0000 mg | ORAL_TABLET | Freq: Once | ORAL | Status: AC
  Administered 2021-06-17: 800 mg via ORAL
  Filled 2021-06-17: qty 1

## 2021-06-17 MED ORDER — ACETAMINOPHEN 500 MG PO TABS: 500.0000 mg | ORAL_TABLET | Freq: Four times a day (QID) | ORAL | 0 refills | Status: AC | PRN

## 2021-06-17 MED ORDER — ACETAMINOPHEN 325 MG PO TABS
650.0000 mg | ORAL_TABLET | Freq: Once | ORAL | Status: AC
  Administered 2021-06-17: 650 mg via ORAL
  Filled 2021-06-17: qty 2

## 2021-06-17 MED ORDER — IBUPROFEN 600 MG PO TABS: 600.0000 mg | ORAL_TABLET | Freq: Four times a day (QID) | ORAL | 0 refills | Status: DC | PRN

## 2021-06-17 NOTE — ED Provider Notes (Signed)
Emergency Medicine Provider Triage Evaluation Note  Duane Price , a 35 y.o. male  was evaluated in triage.  Pt complains of  lower back pain. History of heroin abuse, MRSA bacteremia and tricuspid valve endocarditis currently undergoing antibiotic treatment outpatient with Dr. Renold Don. Same here with similar complaint on 9/19.  She states that he followed up with Dr. Seth Bake yesterday and was told that he cannot get a another fusion of his antibiotics until next week.  Patient continues to have worsening low back pain, states that it is so painful that he cannot walk.  Denies any numbness or tingling in his legs.  He is concerned that he may be septic, though denies any fevers or chills.  Review of Systems  Positive: As above Negative: As above  Physical Exam  BP (!) 101/56   Pulse 88   Temp (!) 97.4 F (36.3 C) (Oral)   Resp 16   SpO2 100%  Gen:   Awake, no distress   Resp:  Normal effort  MSK:   5/5 strength in lower extremities bilaterally, stations intact.  Visible erythema and midline tenderness of the lumbar spine.  No crepitus or fluctuance noted. Other:    Medical Decision Making  Medically screening exam initiated at 12:57 PM.  Appropriate orders placed.  Ayven Pheasant was informed that the remainder of the evaluation will be completed by another provider, this initial triage assessment does not replace that evaluation, and the importance of remaining in the ED until their evaluation is complete.     Mare Ferrari, PA-C 06/17/21 1259    Franne Forts, DO 06/19/21 1157

## 2021-06-17 NOTE — Discharge Instructions (Signed)
Please make sure that you do not miss your antibiotic appointments on 9/28 and 10/5.  These are important to help the infection in your back.  Once your infection is treated with antibiotics you will begin to feel better.  It was a pleasure to meet you and I hope that you feel better soon.

## 2021-06-17 NOTE — ED Provider Notes (Signed)
[facility] EMERGENCY DEPARTMENT Provider Note   CSN: [id number] Arrival date & time: 06/17/21  1241     History Chief Complaint  Patient presents with   Back Pain    Lynx Goodrich is a 35 y.o. male with a past medical history of heroin abuse, MRSA bacteremia and tricuspid valve endocarditis with a complaint of lower back pain.  Patient was admitted to the hospital on the 8/19 for lower back pain and diagnosed with osteomyelitis/discitis at T12-L1.  Patient was discharged on 9/5 and was supposed to begin OP abx infusions.  He used heroin twice after his discharge because he was going through terrible withdrawals and was unable to get in with the methadone clinic. Missed his abx infusions furing this time.  Patient followed up with MD Comer with infectious disease 2 days ago.  He scheduled the patient to receive IV antibiotic treatment on 9/28 and 10/5.  Patient reports that his back pain is worsening and that he "can barely even walk."  Concern for sepsis.  Also reporting that he is homeless and having difficulty getting comfortable while sleeping outdoors.  Has not used drugs since last visit on 9/19.    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   Septic embolism (HCC) 03/25/2021   Normocytic anemia 03/25/2021   IV drug abuse (HCC) 03/25/2021    Past Surgical History:  Procedure Laterality Date   APPLICATION OF ANGIOVAC N/A 03/29/2021   Procedure: APPLICATION OF ANGIOVAC;  Surgeon: Corliss Skains, MD;  Location: [facility] OR;   Service: Vascular;  Laterality: N/A;   BRAIN SURGERY     facial surgery         Family History  Family history unknown: Yes    Social History   Tobacco Use   Smoking status: Every Day    Packs/day: 0.50    Types: Cigarettes   Smokeless tobacco: Never  Vaping Use   Vaping Use: Never used  Substance Use Topics   Alcohol use: Not Currently    Comment: Per patient   Drug use: Yes    Types: Cocaine, IV    Comment: Heroin    Home Medications Prior to Admission medications   Medication Sig Start Date End Date Taking? Authorizing Provider  apixaban (ELIQUIS) 5 MG TABS tablet Take 1 tablet (5 mg total) by mouth 2 (two) times daily. Patient not taking: Reported on 06/13/2021 04/25/21   Kathlen Mody, MD  hydrOXYzine (ATARAX/VISTARIL) 25 MG tablet Take 1 tablet (25 mg total) by mouth 3 (three) times daily as needed for anxiety. Patient not taking: Reported on 06/13/2021 04/22/21   Kathlen Mody, MD  METHADONE HCL PO Take by mouth.    [provider]  pantoprazole (PROTONIX) 40 MG tablet Take 1 tablet (40 mg total) by mouth daily. Patient not taking: Reported on 06/13/2021 04/23/21 06/13/21  Kathlen Mody, MD    Allergies    Patient has no known allergies.  Review of Systems   Review of Systems  Constitutional:  Negative for chills, fatigue  and fever.  Respiratory:  Negative for cough and shortness of breath.   Cardiovascular:  Negative for chest pain and palpitations.  Gastrointestinal:  Negative for constipation.  Genitourinary:  Negative for difficulty urinating.  Musculoskeletal:  Positive for back pain. Negative for gait problem and joint swelling.  Skin:  Negative for rash and wound.  Neurological:  Negative for dizziness and weakness.  All other systems reviewed and are negative.  Physical Exam Updated Vital Signs BP (!) 101/56   Pulse 88   Temp (!) 97.4 F (36.3 C) (Oral)   Resp 16   SpO2 100%   Physical Exam Vitals and nursing note reviewed.   Constitutional:      Appearance: Normal appearance.  HENT:     Head: Normocephalic and atraumatic.  Eyes:     General: No scleral icterus.    Conjunctiva/sclera: Conjunctivae normal.  Cardiovascular:     Rate and Rhythm: Normal rate and regular rhythm.  Pulmonary:     Effort: Pulmonary effort is normal. No respiratory distress.  Abdominal:     General: Abdomen is flat.     Tenderness: There is no abdominal tenderness.  Musculoskeletal:        General: No tenderness or signs of injury.     Comments: Patient with full range of motion of spine.  Ambulatory.  Skin:    General: Skin is warm and dry.     Findings: No rash.  Neurological:     Mental Status: He is alert.     Motor: No weakness.     Gait: Gait normal.  Psychiatric:        Mood and Affect: Mood normal.        Behavior: Behavior normal.    ED Results / Procedures / Treatments   Labs (all labs ordered are listed, but only abnormal results are displayed) Labs Reviewed  CBC WITH DIFFERENTIAL/PLATELET - Abnormal; Notable for the following components:      Result Value   RBC 4.20 (*)    Hemoglobin 11.2 (*)    HCT 35.4 (*)    Platelets 410 (*)    All other components within normal limits  COMPREHENSIVE METABOLIC PANEL - Abnormal; Notable for the following components:   Sodium 133 (*)    Chloride 96 (*)    BUN 5 (*)    Total Protein 8.8 (*)    Albumin 3.4 (*)    AST 14 (*)    All other components within normal limits  CULTURE, BLOOD (ROUTINE X 2)  LACTIC ACID, PLASMA    EKG None  Radiology No results found.  Procedures Procedures   Medications Ordered in ED Medications  ibuprofen (ADVIL) tablet 800 mg (has no administration in time range)    ED Course  I have reviewed the triage vital signs and the nursing notes. I spent >30 minutes reviewing patient's past charts. Discussed with MD. Jeraldine Loots.  Pertinent labs & imaging results that were available during my care of the patient were reviewed by me  and considered in my medical decision making (see chart for details).    MDM Rules/Calculators/A&P Keisean Skowron is a 35 y.o. male with a past medical history of heroin abuse, MRSA bacteremia and tricuspid valve endocarditis with a complaint of lower back pain.  Patient was admitted to the hospital on the 8/19 for lower back pain and diagnosed with osteomyelitis/discitis at T12-L1.  Patient was discharged on 9/5 and was supposed to begin OP abx infusions.  He used heroin twice  after his discharge because he was going through terrible withdrawals and was unable to get in with the methadone clinic. Missed his abx infusions furing this time.  Patient followed up with MD Comer with infectious disease 2 days ago.  He scheduled the patient to receive IV antibiotic treatment on 9/28 and 10/5.  Patient reports that his back pain is worsening and that he "can barely even walk."  Concern for sepsis.  Also reporting that he is homeless and having difficulty getting comfortable while sleeping outdoors.  Has not used drugs since last visit on 9/19.   I evaluated this patient today as well as on 9/19.  He continues to deny fever, chills, saddle anesthesia, bowel/bladder dysfunction.  He reports difficulty navigating this pain and illness while he is homeless.  Reports he has not been able to even try ibuprofen or Tylenol.  I offered for the patient to speak with social work however he feels as though he is done this in the past and it has not assisted in.  Reports that shelters are "not a good place to be."  Reports that he will just have to suck it up.  I assured him that he is not septic today.  We discussed the reality that he did not meet admission criteria today.  He will continue to follow with Dr. Luciana Axe about his known osteomyelitis and discitis.  He was informed about the date of his appointments and he says he will not miss them.  Stable and agreeable for discharge.  Continues to deny need to speak with our social  worker today.  I supplied the patient with ibuprofen, food and drink prior to departure.  Final Clinical Impression(s) / ED Diagnoses Final diagnoses:  Acute bilateral low back pain without sciatica    Rx / DC Orders Results and diagnoses were explained to the patient. Return precautions discussed in full. Patient had no additional questions and expressed complete understanding.     Woodroe Chen 06/17/21 1751    Gerhard Munch, MD 06/17/21 2258

## 2021-06-17 NOTE — ED Triage Notes (Signed)
Pt c.o continued back pain, currently getting IV abx therapy for an infection in his spine. Pt ambulatory

## 2021-06-18 LAB — CULTURE, BLOOD (ROUTINE X 2): Culture: NO GROWTH

## 2021-06-21 ENCOUNTER — Other Ambulatory Visit (HOSPITAL_COMMUNITY): Payer: Self-pay | Admitting: *Deleted

## 2021-06-22 ENCOUNTER — Ambulatory Visit (HOSPITAL_COMMUNITY)
Admission: RE | Admit: 2021-06-22 | Discharge: 2021-06-22 | Disposition: A | Discharge status: Home / Self Care | Payer: Self-pay | Source: Ambulatory Visit | Attending: Internal Medicine | Admitting: Internal Medicine

## 2021-06-22 ENCOUNTER — Other Ambulatory Visit: Payer: Self-pay

## 2021-06-22 DIAGNOSIS — M4645 Discitis, unspecified, thoracolumbar region: Secondary | ICD-10-CM | POA: Insufficient documentation

## 2021-06-22 LAB — CULTURE, BLOOD (ROUTINE X 2): Culture: NO GROWTH

## 2021-06-22 MED ORDER — DALBAVANCIN HCL 500 MG IV SOLR
1000.0000 mg | Freq: Once | INTRAVENOUS | Status: DC
Start: 2021-06-22 — End: 2021-06-22
  Administered 2021-06-22: 1000 mg via INTRAVENOUS
  Filled 2021-06-22: qty 50

## 2021-06-27 ENCOUNTER — Ambulatory Visit (HOSPITAL_COMMUNITY): Payer: Self-pay

## 2021-06-29 ENCOUNTER — Inpatient Hospital Stay (HOSPITAL_COMMUNITY): Admission: RE | Admit: 2021-06-29 | Payer: Self-pay | Source: Ambulatory Visit

## 2021-07-06 ENCOUNTER — Other Ambulatory Visit: Payer: Self-pay

## 2021-07-06 ENCOUNTER — Ambulatory Visit (HOSPITAL_COMMUNITY)
Admission: RE | Admit: 2021-07-06 | Discharge: 2021-07-06 | Disposition: A | Discharge status: Home / Self Care | Payer: Self-pay | Source: Ambulatory Visit | Attending: Internal Medicine | Admitting: Internal Medicine

## 2021-07-06 DIAGNOSIS — M4645 Discitis, unspecified, thoracolumbar region: Secondary | ICD-10-CM | POA: Insufficient documentation

## 2021-07-06 MED ORDER — DEXTROSE 5 % IV SOLN
500.0000 mg | Freq: Once | INTRAVENOUS | Status: AC
  Administered 2021-07-06: 500 mg via INTRAVENOUS
  Filled 2021-07-06: qty 25

## 2021-07-13 ENCOUNTER — Ambulatory Visit: Payer: Self-pay | Admitting: Internal Medicine

## 2021-11-24 ENCOUNTER — Emergency Department (HOSPITAL_COMMUNITY)
Admission: EM | Admit: 2021-11-24 | Discharge: 2021-11-25 | Disposition: A | Discharge status: Home / Self Care | Payer: Self-pay | Attending: Emergency Medicine | Admitting: Emergency Medicine

## 2021-11-24 ENCOUNTER — Other Ambulatory Visit: Payer: Self-pay

## 2021-11-24 DIAGNOSIS — L03115 Cellulitis of right lower limb: Secondary | ICD-10-CM | POA: Insufficient documentation

## 2021-11-24 DIAGNOSIS — R21 Rash and other nonspecific skin eruption: Secondary | ICD-10-CM

## 2021-11-24 DIAGNOSIS — M545 Low back pain, unspecified: Secondary | ICD-10-CM | POA: Insufficient documentation

## 2021-11-24 DIAGNOSIS — L03119 Cellulitis of unspecified part of limb: Secondary | ICD-10-CM

## 2021-11-24 DIAGNOSIS — L03116 Cellulitis of left lower limb: Secondary | ICD-10-CM | POA: Insufficient documentation

## 2021-11-24 LAB — COMPREHENSIVE METABOLIC PANEL
ALT: 20 U/L (ref 0–44)
AST: 49 U/L — ABNORMAL HIGH (ref 15–41)
Albumin: 2.6 g/dL — ABNORMAL LOW (ref 3.5–5.0)
Alkaline Phosphatase: 91 U/L (ref 38–126)
Anion gap: 9 (ref 5–15)
BUN: 10 mg/dL (ref 6–20)
CO2: 27 mmol/L (ref 22–32)
Calcium: 8.5 mg/dL — ABNORMAL LOW (ref 8.9–10.3)
Chloride: 94 mmol/L — ABNORMAL LOW (ref 98–111)
Creatinine, Ser: 0.97 mg/dL (ref 0.61–1.24)
GFR, Estimated: >60 mL/min (ref >60)
Glucose, Bld: 114 mg/dL — ABNORMAL HIGH (ref 70–99)
Potassium: 6.1 mmol/L — ABNORMAL HIGH (ref 3.5–5.1)
Sodium: 130 mmol/L — ABNORMAL LOW (ref 135–145)
Total Bilirubin: 1.9 mg/dL — ABNORMAL HIGH (ref 0.3–1.2)
Total Protein: 8 g/dL (ref 6.5–8.1)

## 2021-11-24 LAB — CBC WITH DIFFERENTIAL/PLATELET
Abs Immature Granulocytes: 0.03 10*3/uL (ref 0.00–0.07)
Basophils Absolute: 0 10*3/uL (ref 0.0–0.1)
Basophils Relative: 0 %
Eosinophils Absolute: 0.1 10*3/uL (ref 0.0–0.5)
Eosinophils Relative: 1 %
HCT: 31.3 % — ABNORMAL LOW (ref 39.0–52.0)
Hemoglobin: 9.6 g/dL — ABNORMAL LOW (ref 13.0–17.0)
Immature Granulocytes: 0 %
Lymphocytes Relative: 27 %
Lymphs Abs: 2.7 10*3/uL (ref 0.7–4.0)
MCH: 23.9 pg — ABNORMAL LOW (ref 26.0–34.0)
MCHC: 30.7 g/dL (ref 30.0–36.0)
MCV: 77.9 fL — ABNORMAL LOW (ref 80.0–100.0)
Monocytes Absolute: 0.6 10*3/uL (ref 0.1–1.0)
Monocytes Relative: 6 %
Neutro Abs: 6.5 10*3/uL (ref 1.7–7.7)
Neutrophils Relative %: 66 %
Platelets: 508 10*3/uL — ABNORMAL HIGH (ref 150–400)
RBC: 4.02 MIL/uL — ABNORMAL LOW (ref 4.22–5.81)
RDW: 14.6 % (ref 11.5–15.5)
WBC: 10 10*3/uL (ref 4.0–10.5)
nRBC: 0 % (ref 0.0–0.2)

## 2021-11-24 MED ORDER — DIPHENHYDRAMINE HCL 50 MG/ML IJ SOLN
25.0000 mg | Freq: Once | INTRAMUSCULAR | Status: DC
  Filled 2021-11-24: qty 1

## 2021-11-24 NOTE — ED Notes (Signed)
Pt refused IV stick. Pt states "I think I still need more time before you stick me." ?

## 2021-11-24 NOTE — ED Triage Notes (Signed)
Pt reports one week ago had itching to bottom of feet and hands and then developed rash. Itchy rash remains. Has taken Benadryl for same in past with relief but didn't have any Benadryl to take today.  ?

## 2021-11-24 NOTE — ED Provider Notes (Signed)
[facility] EMERGENCY DEPARTMENT Provider Note   CSN: [id number] Arrival date & time: 11/24/21  1035     History  Chief Complaint  Patient presents with   Rash    Duane Price is a 36 y.o. male.  The history is provided by the patient and medical records. No language interpreter was used.  Rash Location:  Shoulder/arm, hand, foot and leg Quality: itchiness and redness   Severity:  Severe Onset quality:  Gradual Duration:  1 week Timing:  Constant Progression:  Worsening Chronicity:  Recurrent Relieved by:  Nothing Worsened by:  Nothing Ineffective treatments:  None tried Associated symptoms: no abdominal pain, no diarrhea, no fatigue, no fever, no headaches, no induration, no myalgias, no nausea, no shortness of breath, no throat swelling, no tongue swelling, no URI, not vomiting and not wheezing       Home Medications Prior to Admission medications   Medication Sig Start Date End Date Taking? Authorizing Provider  acetaminophen (TYLENOL) 500 MG tablet Take 1 tablet (500 mg total) by mouth every 6 (six) hours as needed. 06/17/21   Redwine, Madison A, PA-C  apixaban (ELIQUIS) 5 MG TABS tablet Take 1 tablet (5 mg total) by mouth 2 (two) times daily. Patient not taking: Reported on 06/13/2021 04/25/21   Kathlen Mody, MD  hydrOXYzine (ATARAX/VISTARIL) 25 MG tablet Take 1 tablet (25 mg total) by mouth 3 (three) times daily as needed for anxiety. Patient not taking: Reported on 06/13/2021 04/22/21   Kathlen Mody, MD  ibuprofen (ADVIL) 600 MG tablet Take 1 tablet (600 mg total) by mouth every 6 (six) hours as needed. 06/17/21   Redwine, Madison A, PA-C  METHADONE HCL PO Take by mouth.    [provider]  pantoprazole (PROTONIX) 40 MG tablet Take 1 tablet (40 mg total) by mouth daily. Patient not taking: Reported on 06/13/2021 04/23/21 06/13/21  Kathlen Mody, MD      Allergies    Patient has no known allergies.    Review of Systems   Review of Systems   Constitutional:  Negative for chills, fatigue and fever.  HENT:  Negative for congestion.   Respiratory:  Negative for choking, chest tightness, shortness of breath and wheezing.   Cardiovascular:  Negative for chest pain, palpitations and leg swelling.  Gastrointestinal:  Negative for abdominal pain, constipation, diarrhea, nausea and vomiting.  Genitourinary:  Negative for dysuria and flank pain.  Musculoskeletal:  Positive for back pain. Negative for myalgias, neck pain and neck stiffness.  Skin:  Positive for rash. Negative for wound.  Neurological:  Negative for seizures, weakness, light-headedness, numbness and headaches.  Psychiatric/Behavioral:  Negative for agitation and confusion.   All other systems reviewed and are negative.  Physical Exam Updated Vital Signs BP (!) 104/59 (BP Location: Right Arm)    Pulse 66    Temp 97.6 F (36.4 C) (Oral)    Resp 18    SpO2 (!) 79%  Physical Exam Vitals and nursing note reviewed.  Constitutional:      General: He is not in acute distress.    Appearance: He is well-developed. He is not ill-appearing, toxic-appearing or diaphoretic.  HENT:     Head: Normocephalic and atraumatic.     Nose: Nose normal. No congestion or rhinorrhea.     Mouth/Throat:     Mouth: Mucous membranes are moist.     Pharynx: No oropharyngeal exudate or posterior oropharyngeal erythema.  Eyes:     Extraocular Movements: Extraocular movements intact.  Conjunctiva/sclera: Conjunctivae normal.     Pupils: Pupils are equal, round, and reactive to light.  Cardiovascular:     Rate and Rhythm: Normal rate and regular rhythm.     Heart sounds: Murmur heard.  Pulmonary:     Effort: Pulmonary effort is normal. No respiratory distress.     Breath sounds: Normal breath sounds. No wheezing, rhonchi or rales.  Chest:     Chest wall: No tenderness.  Abdominal:     Palpations: Abdomen is soft.     Tenderness: There is no abdominal tenderness. There is no right CVA  tenderness, left CVA tenderness, guarding or rebound.  Musculoskeletal:        General: Tenderness (low back) present. No swelling.     Cervical back: Neck supple. No tenderness.     Right lower leg: No edema.     Left lower leg: No edema.  Skin:    General: Skin is warm and dry.     Capillary Refill: Capillary refill takes less than 2 seconds.     Findings: Erythema present.  Neurological:     General: No focal deficit present.     Mental Status: He is alert.     Sensory: No sensory deficit.     Motor: No weakness.  Psychiatric:        Mood and Affect: Mood normal.         ED Results / Procedures / Treatments   Labs (all labs ordered are listed, but only abnormal results are displayed) Labs Reviewed  CBC WITH DIFFERENTIAL/PLATELET - Abnormal; Notable for the following components:      Result Value   RBC 4.02 (*)    Hemoglobin 9.6 (*)    HCT 31.3 (*)    MCV 77.9 (*)    MCH 23.9 (*)    Platelets 508 (*)    All other components within normal limits  COMPREHENSIVE METABOLIC PANEL - Abnormal; Notable for the following components:   Sodium 130 (*)    Potassium 6.1 (*)    Chloride 94 (*)    Glucose, Bld 114 (*)    Calcium 8.5 (*)    Albumin 2.6 (*)    AST 49 (*)    Total Bilirubin 1.9 (*)    All other components within normal limits  CULTURE, BLOOD (ROUTINE X 2)  CULTURE, BLOOD (ROUTINE X 2)  ROCKY MTN SPOTTED FVR ABS PNL(IGG+IGM)    EKG None  Radiology No results found.  Procedures Procedures    Medications Ordered in ED Medications  diphenhydrAMINE (BENADRYL) injection 25 mg (0 mg Intravenous Hold 11/24/21 2045)    ED Course/ Medical Decision Making/ A&P                           Medical Decision Making Amount and/or Complexity of Data Reviewed Labs: ordered. Radiology: ordered.  Risk Prescription drug management.    Duane Price is a 36 y.o. male with a past medical history significant for previous endocarditis with septic emboli, previous  discitis/ostomy last, and previous PE who presents with 2 complaints, pain in his mid/low back recurring as well as rash on his arms and legs.  According to patient, his first concern is the back pain.  He reports that he had infection in his back from previous IV drug use that has continued to bother him on and off ever since discharge last year.  He said that recently has been hurting worse and is up  to 10 out of 10 at times.  He reports it is moderate currently.  He denies any recent IV drug use and has not done so in quite some time.  He denies any numbness, tingling, weakness of extremities but feels that the pain in his back feels like when he had the infection.  He denies fevers, chills, or other systemic signs of infection otherwise.  He denies any nausea, vomiting, constipation, diarrhea, chest pain, palpitations, shortness of breath.  No headache or neck pain reported.  He wants to make sure his back is not infected.   his other concern is pruritic rash that is on his bilateral feet, lower legs, and his hands and arms.  He says it feels like when he has had allergic reactions in the past but also somewhat feels like when he had scabies.  Patient says that he lives and sleeps outside and is unsure if there are any ticks that have been on him.  He says he normally wears pants but has developed rash for the last week on the entirety of both feet and has been going up his legs.  He also reports that on his hands going up his forearms.  He denies any rash on his chest, back, abdomen, neck, or face.  He reported some tingling around his lips with resolved.  No swelling appreciated.  No rash in his mouth reported.  On exam, patient does have a slight systolic murmur.  Chest and abdomen nontender.  No rash seen on the upper torso.  Back nontender.  Lungs clear.  No rash seen on the neck or back.  No swelling or rash seen on the face.  No rash in the mouth.  Pupils symmetric and reactive.  No focal neurologic  deficits.  Normal sensation and strength in lower extremities and upper extremities.  Good pulses in extremities.  Legs have rash on both feet and both legs.  There are some linear series of the dots that could suggest scabies however contact dermatitis is also considered as he sleeps outside.  He does not think he has walk-through any fields or grasses wearing shorts and does not think he is had any ticks on him.  He also has the rash on both hands going up his forearms.  They are not significantly painful.  Denies recent IV drug use and they do not appear to be Osler nodes.  He denies history of recommend spotted fever.  Clinically I suspect patient either has a contact dermatitis with some cellulitis of the feet versus scabies versus recommend spotted fever is in the rash.  We will get some screening labs and will send off an RMSF titer.  Anticipate discharge with doxycycline to cover both RMSF and a cellulitis however we will also give treatment for scabies.  In regards to his back pain, will get screening labs and get MRIs of his T and L-spine as he reports it feels similar when he had the infection and he is concerned about it.  Anticipate reassessment after labs, MRIs, and work-up has been completed.  Care transferred to oncoming team to wait for MRI results of the back.  If they are reassuring, dissipate discharge home with doxycycline for RMSF/cellulitis coverage and likely permethrin for the suspected scabies.  If MRI is reassuring, anticipate discharge home.        Final Clinical Impression(s) / ED Diagnoses Final diagnoses:  Cellulitis of lower extremity, unspecified laterality    Clinical Impression: 1. Cellulitis of lower  extremity, unspecified laterality     Disposition: Care transferred to oncoming team while awaiting results of MRI.  Anticipate discharge if MRI is reassuring  This note was prepared with assistance of Dragon voice recognition software. Occasional  wrong-word or sound-a-like substitutions may have occurred due to the inherent limitations of voice recognition software.      Ambyr Qadri, Canary Brim, MD 11/25/21 [id number]

## 2021-11-24 NOTE — ED Notes (Signed)
Pt refuses to be stuck for IV at this time. Pt states "just give it a little time." ?

## 2021-11-25 ENCOUNTER — Emergency Department (HOSPITAL_COMMUNITY): Payer: Self-pay

## 2021-11-25 LAB — BASIC METABOLIC PANEL
Anion gap: 7 (ref 5–15)
BUN: 7 mg/dL (ref 6–20)
CO2: 26 mmol/L (ref 22–32)
Calcium: 8.4 mg/dL — ABNORMAL LOW (ref 8.9–10.3)
Chloride: 102 mmol/L (ref 98–111)
Creatinine, Ser: 0.83 mg/dL (ref 0.61–1.24)
GFR, Estimated: >60 mL/min (ref >60)
Glucose, Bld: 97 mg/dL (ref 70–99)
Potassium: 4.1 mmol/L (ref 3.5–5.1)
Sodium: 135 mmol/L (ref 135–145)

## 2021-11-25 IMAGING — MR MR LUMBAR SPINE W/O CM
5 of 6 series · 28 of 48 positions shown · non-contrast
Comparison: [DATE]

CLINICAL DATA: Previous episode of discitis-osteomyelitis. Back
pain

EXAM:
MRI THORACIC AND LUMBAR SPINE WITHOUT CONTRAST
TECHNIQUE: Multiplanar and multiecho pulse sequences of the thoracic and lumbar
spine were obtained without intravenous contrast.

[Series 30: T2 · sagittal · 4.0mm · 0.73mm/px · 5 of 17 slices shown (1 of 3)]
[im 1/17]
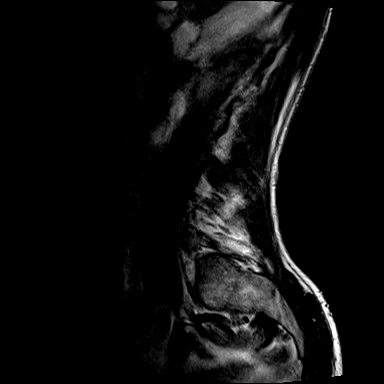
[im 5/17]
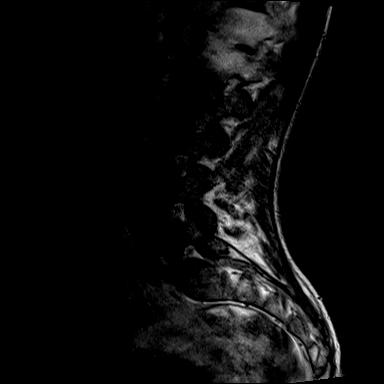
[im 9/17]
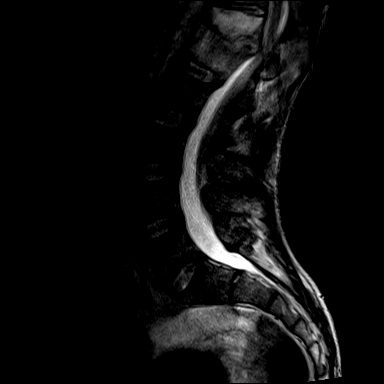
[im 13/17]
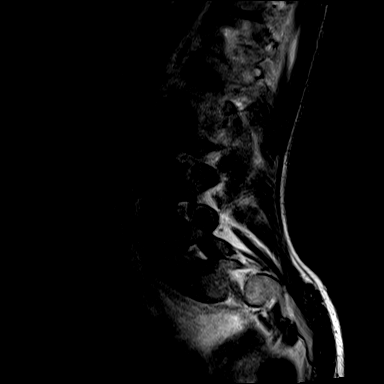
[im 17/17]
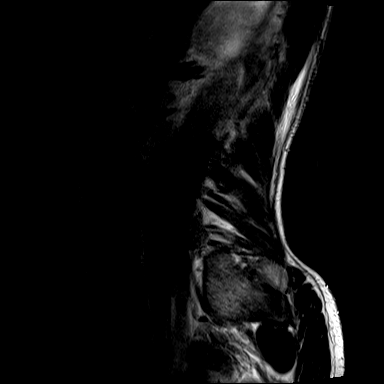

[Series 32: T2 · sagittal · 4.0mm · 0.73mm/px · 5 of 17 slices shown (2 of 3)]
[im 1/17]
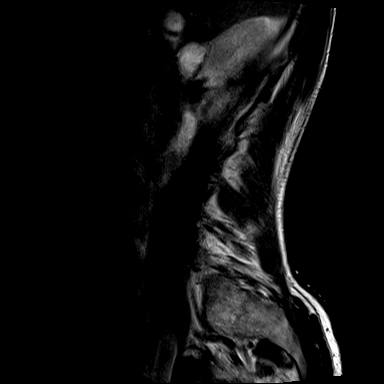
[im 5/17]
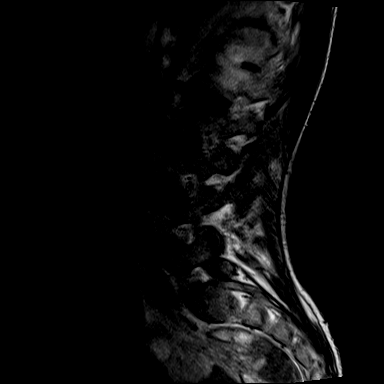
[im 9/17]
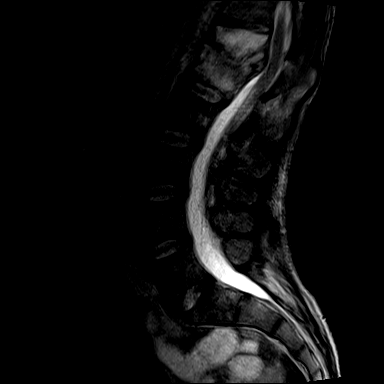
[im 13/17]
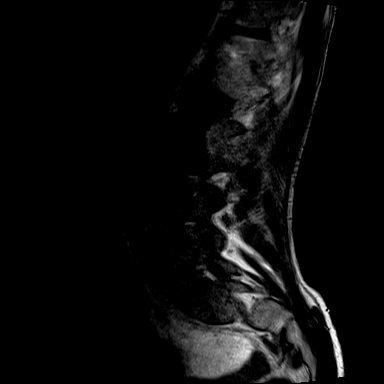
[im 17/17]
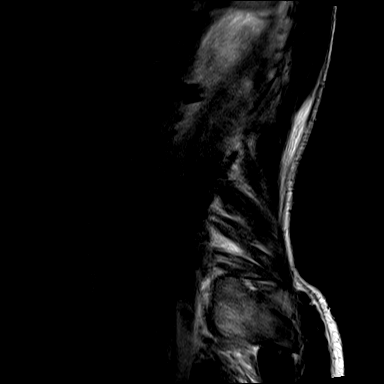

[Series 33: T1 · sagittal · 4.0mm · 0.88mm/px · 5 of 16 slices shown (1 of 2)]
[im 1/16]
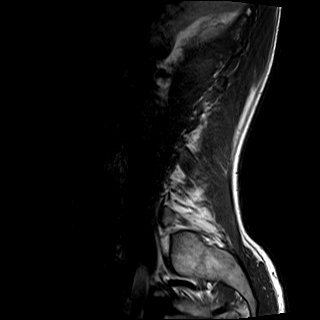
[im 4/16]
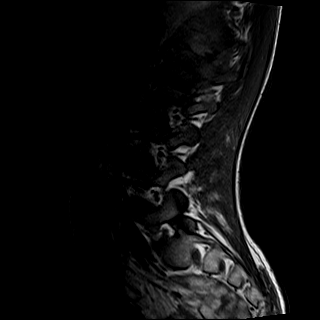
[im 8/16]
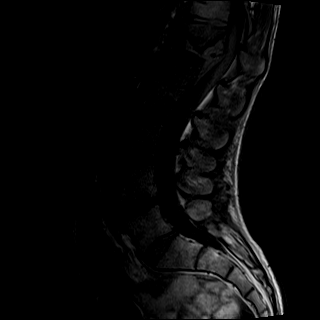
[im 12/16]
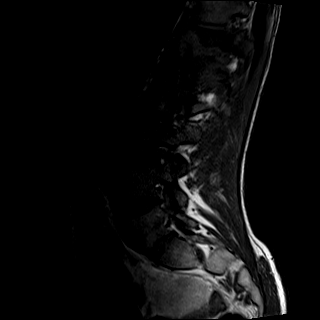
[im 16/16]
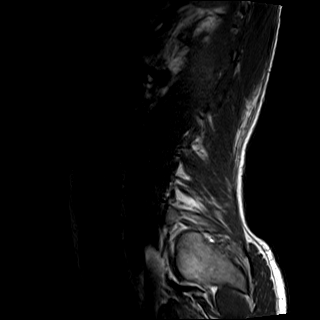

[Series 34: T1 · axial · 4.0mm · 0.34mm/px · z∈[-485,-338]mm · 5 of 48 slices shown (2 of 2)]
[im 1/48]
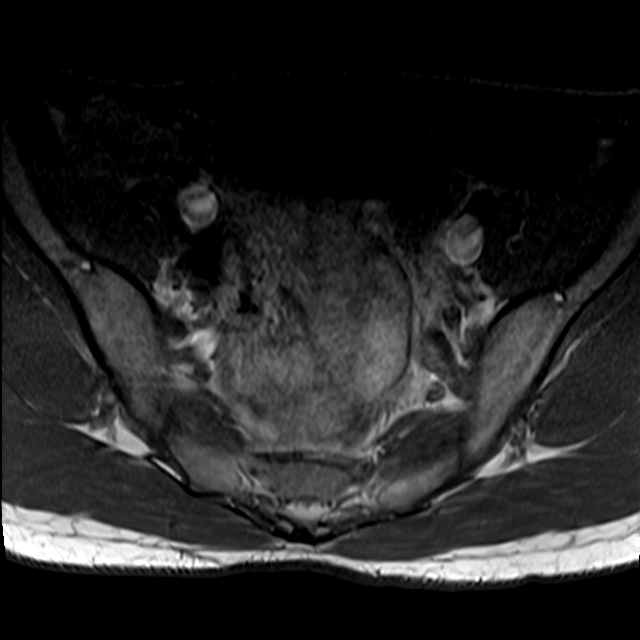
[im 8/48]
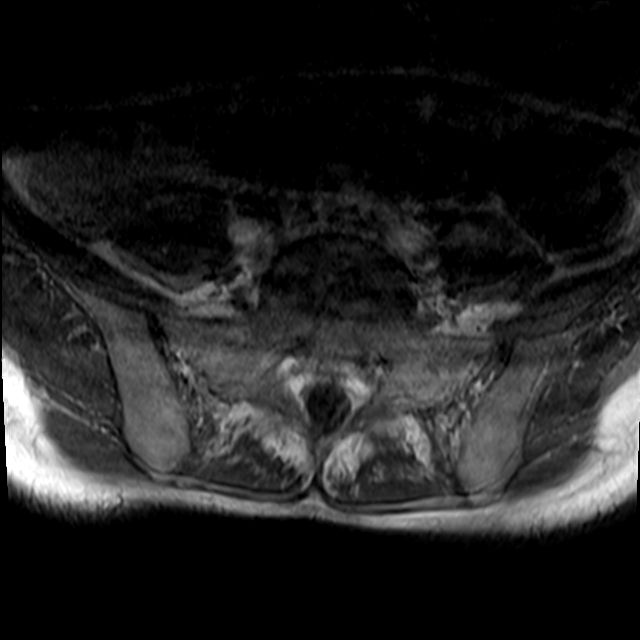
[im 15/48]
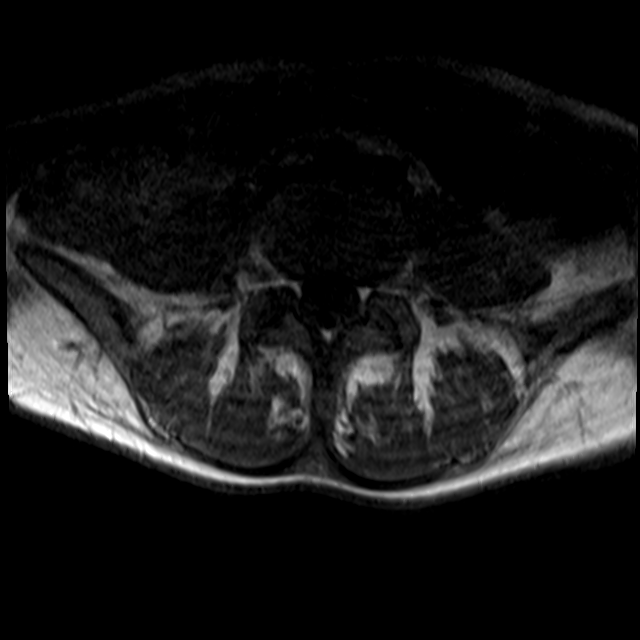
[im 22/48]
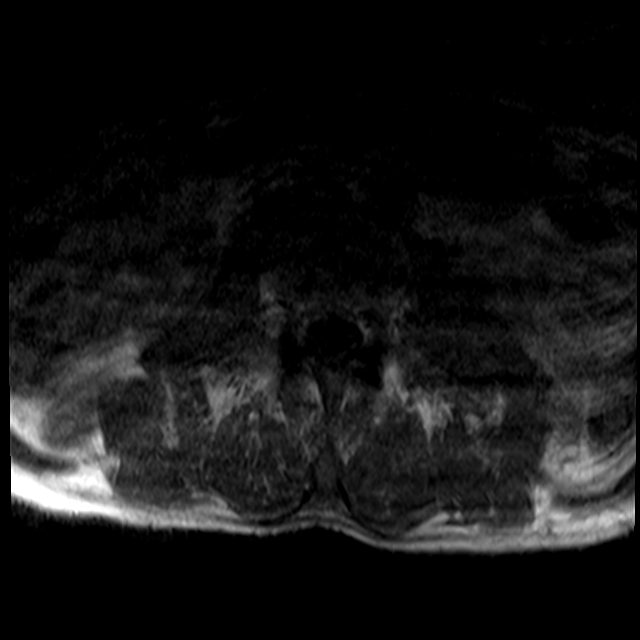
[im 26/48]
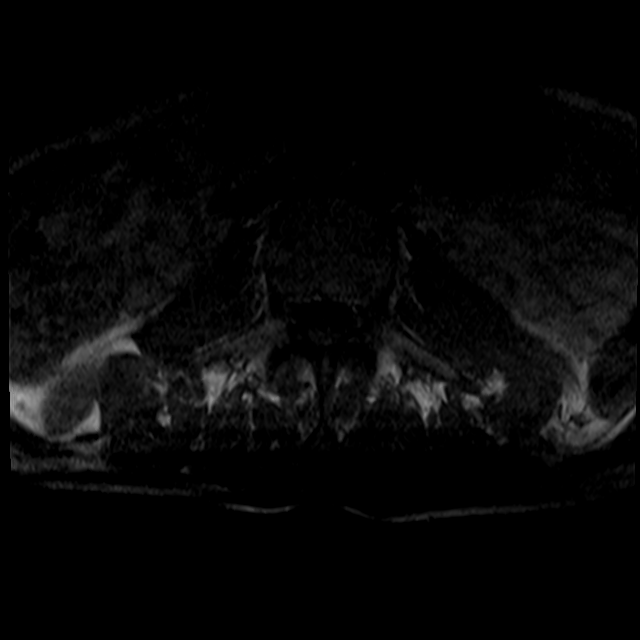

[Series 35: T2 · axial · 4.0mm · 0.65mm/px · z∈[-485,-228]mm · 8 of 48 slices shown (3 of 3)]
[im 1/48]
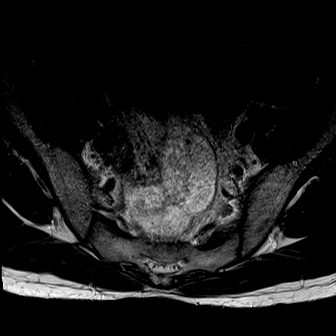
[im 8/48]
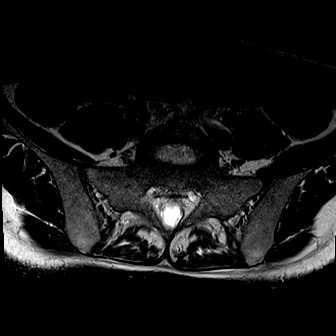
[im 15/48]
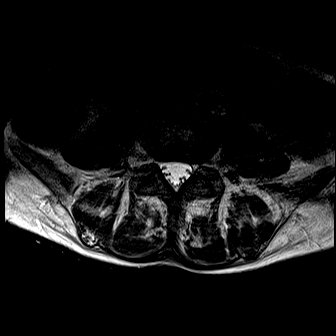
[im 22/48]
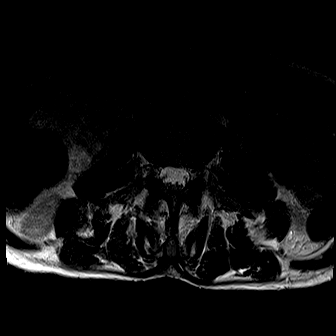
[im 26/48]
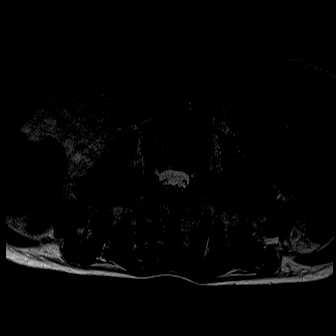
[im 33/48]
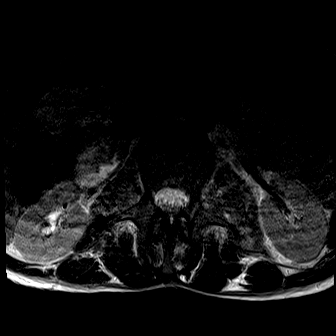
[im 40/48]
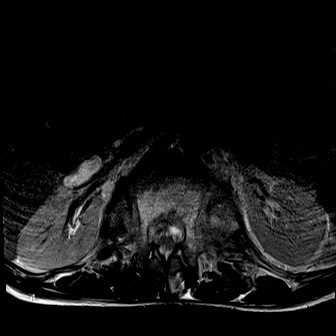
[im 48/48]
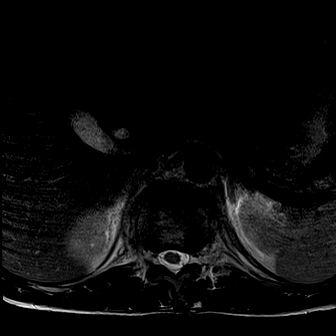

[28 of 48 positions shown; findings below may reference images not displayed]

FINDINGS: MRI THORACIC SPINE FINDINGS

Alignment:  Physiologic.

Vertebrae: Progression of height loss at T12. There is persistent
abnormal signal within the T12-L1 disc space. There is diffuse edema
within the T12 and L1 bone marrow.

Cord:  Normal signal and morphology.

Paraspinal and other soft tissues: Negative

Disc levels:

T8-9: Small right subarticular disc protrusion without stenosis.

Otherwise, no spinal canal or neural foraminal stenosis.

MRI LUMBAR SPINE FINDINGS

Segmentation:  Standard.

Alignment:  Physiologic.

Vertebrae: Progression of height loss at T12 and L1. Posterior
bulging of the disc space is also increased.

Conus medullaris and cauda equina: Conus extends to the L1-2 level.
Conus and cauda equina appear normal.

Paraspinal and other soft tissues: Negative

Disc levels:

At T12-L1, there is moderate spinal canal stenosis and severe left
neural foraminal stenosis.

There is no disc herniation, spinal canal stenosis or neural
impingement at any of the other lumbar levels.
IMPRESSION: 1. Progression of height loss at T12-L1 with persistent abnormal
signal within the disc space consistent with sequelae of
discitis-osteomyelitis.
2. Progression to moderate spinal canal stenosis and severe left
neural foraminal stenosis at the T12-L1 level.
3. No thoracic or lumbar spinal canal stenosis otherwise.

## 2021-11-25 IMAGING — MR MR THORACIC SPINE W/O CM
8 of 12 series · 22 of 48 positions shown · non-contrast
Comparison: [DATE]

CLINICAL DATA: Previous episode of discitis-osteomyelitis. Back
pain

EXAM:
MRI THORACIC AND LUMBAR SPINE WITHOUT CONTRAST
TECHNIQUE: Multiplanar and multiecho pulse sequences of the thoracic and lumbar
spine were obtained without intravenous contrast.

[Series 12: T1 · sagittal · 5.0mm · 1.46mm/px · 1 of 9 slices shown (1 of 6)]
[im 1/9]
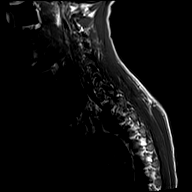

[Series 13: T1 · sagittal · 5.0mm · 1.23mm/px · 1 of 9 slices shown (2 of 6)]
[im 1/9]
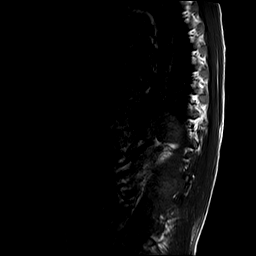

[Series 14: T1 · sagittal · 6.0mm · 1.23mm/px · 1 of 8 slices shown (3 of 6)]
[im 1/8]
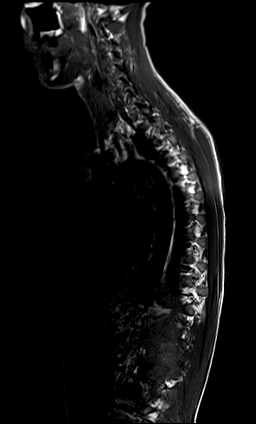

[Series 15: T2 · sagittal · 3.0mm · 0.76mm/px · 3 of 20 slices shown (1 of 2)]
[im 1/20]
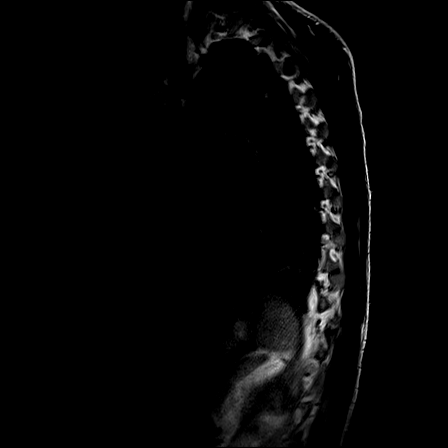
[im 10/20]
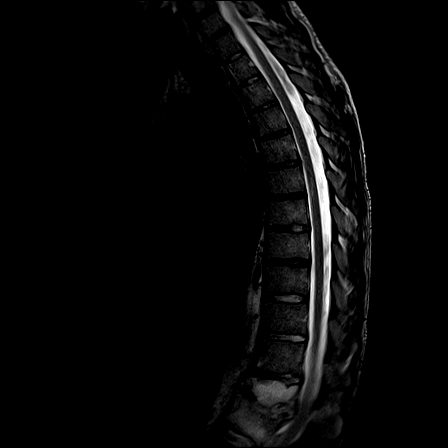
[im 20/20]
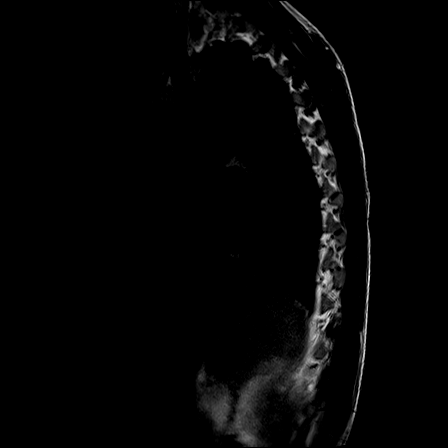

[Series 16: T1 · sagittal · 3.0mm · 0.76mm/px · 3 of 20 slices shown (4 of 6)]
[im 1/20]
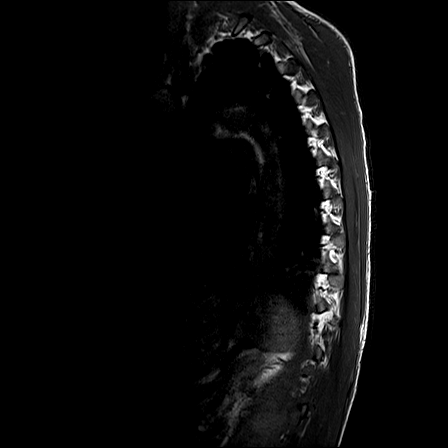
[im 10/20]
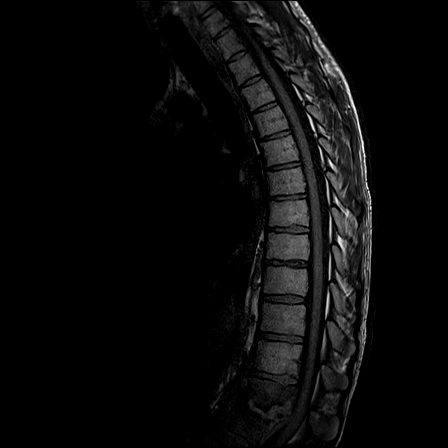
[im 20/20]
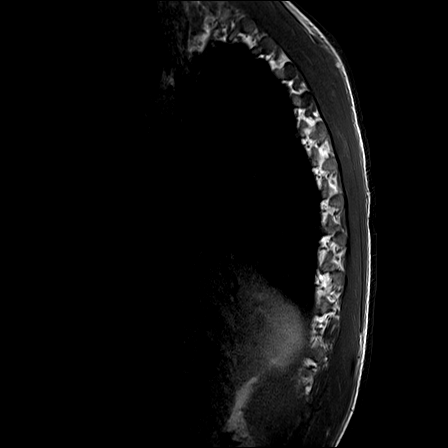

[Series 18: T2 · axial · 4.0mm · 0.59mm/px · z∈[-235,+2]mm · 5 of 39 slices shown (2 of 2)]
[im 1/39]
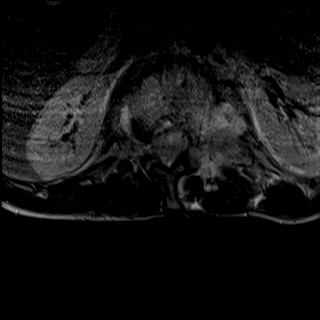
[im 10/39]
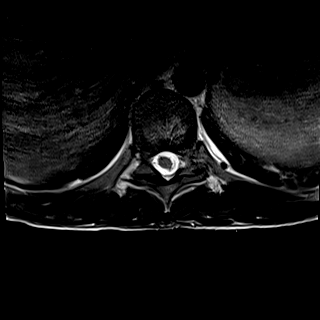
[im 20/39]
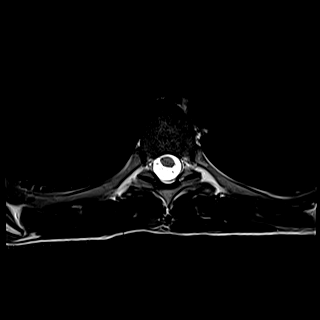
[im 29/39]
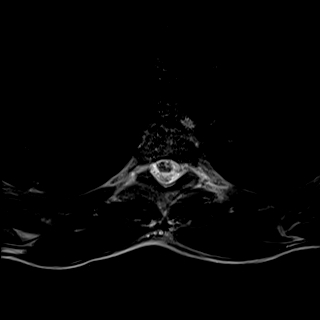
[im 39/39]
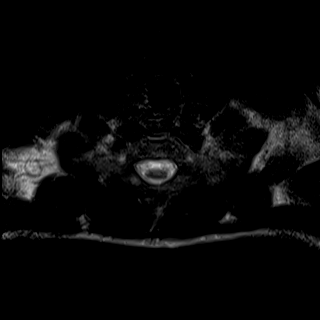

[Series 20: T1 · axial · non-contrast · 4.0mm · 0.31mm/px · z∈[-235,-3]mm · 4 of 26 slices shown (5 of 6)]
[im 1/26]
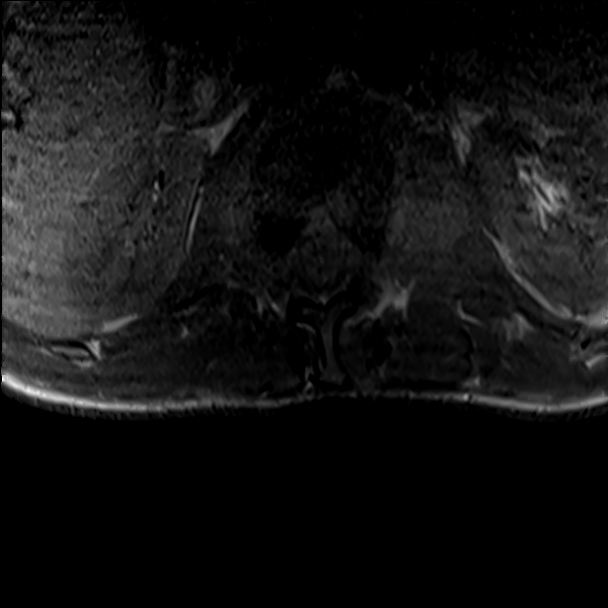
[im 9/26]
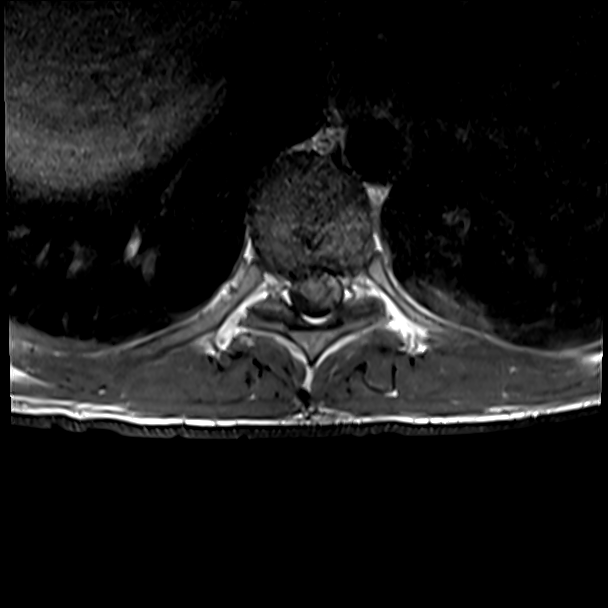
[im 17/26]
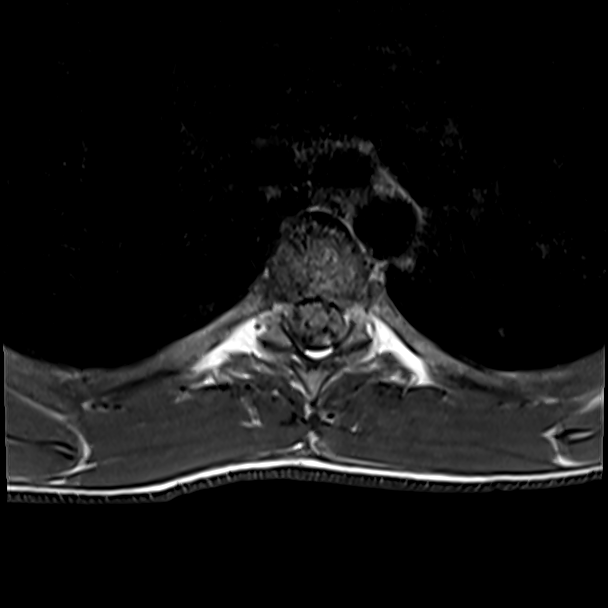
[im 26/26]
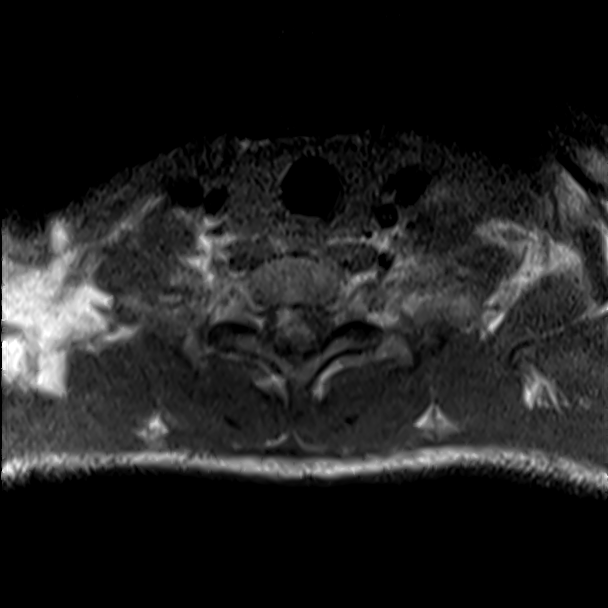

[Series 21: T1 · axial · non-contrast · 4.0mm · 0.31mm/px · z∈[-235,-58]mm · 4 of 39 slices shown (6 of 6)]
[im 1/39]
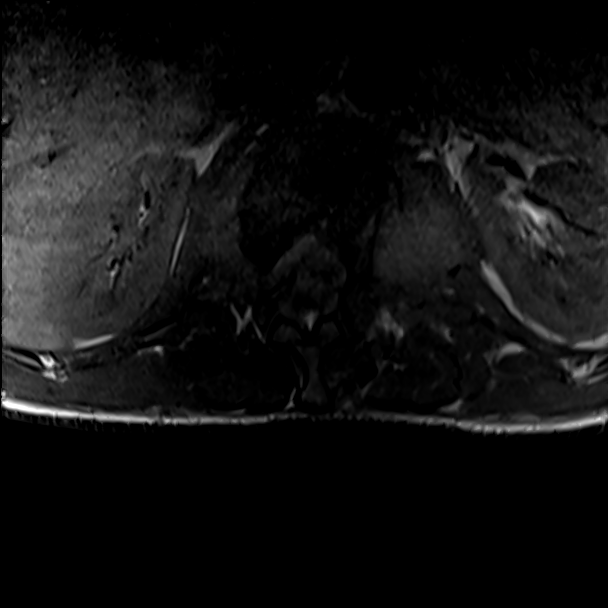
[im 10/39]
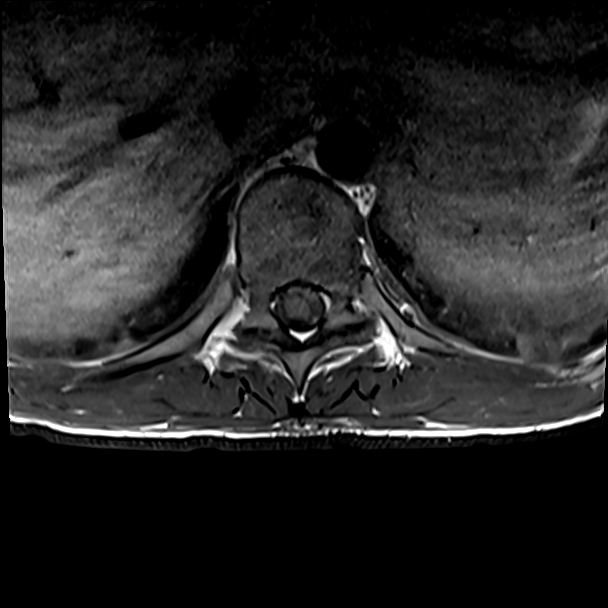
[im 20/39]
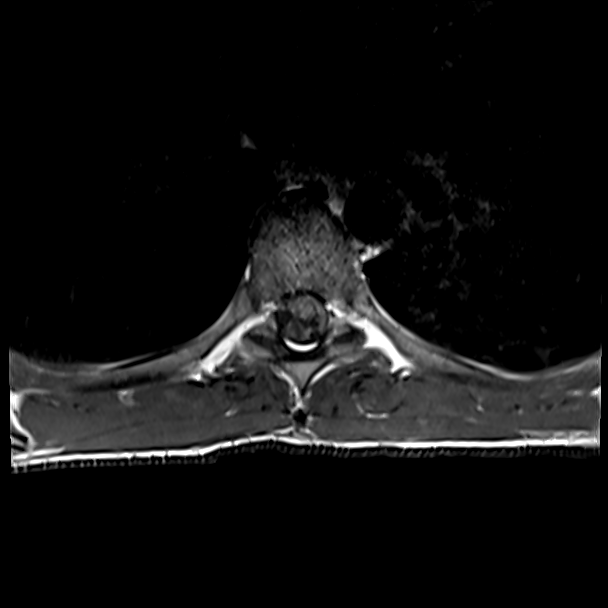
[im 29/39]
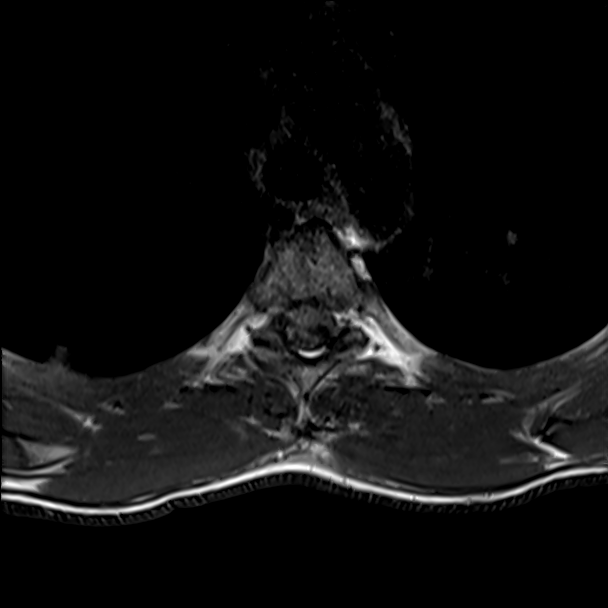

[22 of 48 positions shown; findings below may reference images not displayed]

FINDINGS: MRI THORACIC SPINE FINDINGS

Alignment:  Physiologic.

Vertebrae: Progression of height loss at T12. There is persistent
abnormal signal within the T12-L1 disc space. There is diffuse edema
within the T12 and L1 bone marrow.

Cord:  Normal signal and morphology.

Paraspinal and other soft tissues: Negative

Disc levels:

T8-9: Small right subarticular disc protrusion without stenosis.

Otherwise, no spinal canal or neural foraminal stenosis.

MRI LUMBAR SPINE FINDINGS

Segmentation:  Standard.

Alignment:  Physiologic.

Vertebrae: Progression of height loss at T12 and L1. Posterior
bulging of the disc space is also increased.

Conus medullaris and cauda equina: Conus extends to the L1-2 level.
Conus and cauda equina appear normal.

Paraspinal and other soft tissues: Negative

Disc levels:

At T12-L1, there is moderate spinal canal stenosis and severe left
neural foraminal stenosis.

There is no disc herniation, spinal canal stenosis or neural
impingement at any of the other lumbar levels.
IMPRESSION: 1. Progression of height loss at T12-L1 with persistent abnormal
signal within the disc space consistent with sequelae of
discitis-osteomyelitis.
2. Progression to moderate spinal canal stenosis and severe left
neural foraminal stenosis at the T12-L1 level.
3. No thoracic or lumbar spinal canal stenosis otherwise.

## 2021-11-25 MED ORDER — SODIUM CHLORIDE 0.9 % IV BOLUS: 1000.0000 mL | Freq: Once | INTRAVENOUS | Status: DC

## 2021-11-25 MED ORDER — DOXYCYCLINE HYCLATE 100 MG PO CAPS: 100.0000 mg | ORAL_CAPSULE | Freq: Two times a day (BID) | ORAL | 0 refills | Status: DC

## 2021-11-25 MED ORDER — PERMETHRIN 5 % EX CREA: TOPICAL_CREAM | CUTANEOUS | 0 refills | Status: AC

## 2021-11-25 NOTE — Discharge Instructions (Signed)
Your history, exam, and evaluation today are concerning for either [facility] spotted fever causing the rash, scabies, versus cellulitis.  Please take the doxycycline to treat for cellulitis versus [facility] spotted fever and the permethrin cream if it is scabies.  Please rest and stay hydrated.  I suspect your chronic back pain has flared up in the setting of all of the itching and rash you have been experiencing. ? ? ?  ? ?

## 2021-11-25 NOTE — ED Notes (Signed)
Patient transported to MRI 

## 2021-11-25 NOTE — ED Provider Notes (Signed)
I assumed care of this patient.  Please see previous provider note for further details of Hx, PE.  Briefly patient is a 36 y.o. male who presented with rash and back pain. H/o prior IVDU and diskitis pending MRIs. ? ?MRI reassuring. ? ?The patient appears reasonably screened and/or stabilized for discharge and I doubt any other medical condition or other [facility] requiring further screening, evaluation, or treatment in the ED at this time prior to discharge. Safe for discharge with strict return precautions. ? ?Disposition: Discharge ? ?Condition: Good ? ?I have discussed the results, Dx and Tx plan with the patient/family who expressed understanding and agree(s) with the plan. Discharge instructions discussed at length. The patient/family was given strict return precautions who verbalized understanding of the instructions. No further questions at time of discharge.  ? ? ?ED Discharge Orders   ? ?      Ordered  ?  doxycycline (VIBRAMYCIN) 100 MG capsule  2 times daily       ? 11/25/21 0045  ?  permethrin (ELIMITE) 5 % cream       ? 11/25/21 0045  ? ?  ?  ? ?  ? ? ? ?Follow Up: ?Primary care provider ? ?Call  ?to schedule an appointment for close follow up ? ? ?  ? ? ? ?  ?Nira Conn, MD ?11/25/21 [phone] ? ?

## 2021-11-29 LAB — CULTURE, BLOOD (ROUTINE X 2)
Culture: NO GROWTH
Special Requests: ADEQUATE

## 2021-11-29 LAB — ROCKY MTN SPOTTED FVR ABS PNL(IGG+IGM)
RMSF IgG: POSITIVE — AB
RMSF IgM: 0.62 index (ref 0.00–0.89)

## 2021-11-29 LAB — RMSF, IGG, IFA: RMSF, IGG, IFA: <1:64 {titer}

## 2021-12-01 ENCOUNTER — Inpatient Hospital Stay (HOSPITAL_COMMUNITY)
Admission: EM | Admit: 2021-12-01 | Discharge: 2021-12-03 | DRG: 546 | Disposition: A | Discharge status: Home / Self Care | Payer: Self-pay | Attending: Internal Medicine | Admitting: Internal Medicine

## 2021-12-01 ENCOUNTER — Observation Stay (HOSPITAL_COMMUNITY): Payer: Self-pay

## 2021-12-01 ENCOUNTER — Other Ambulatory Visit: Payer: Self-pay

## 2021-12-01 ENCOUNTER — Encounter (HOSPITAL_COMMUNITY): Payer: Self-pay | Admitting: Emergency Medicine

## 2021-12-01 DIAGNOSIS — D696 Thrombocytopenia, unspecified: Secondary | ICD-10-CM

## 2021-12-01 DIAGNOSIS — Z5900 Homelessness unspecified: Secondary | ICD-10-CM

## 2021-12-01 DIAGNOSIS — D75839 Thrombocytosis, unspecified: Secondary | ICD-10-CM | POA: Diagnosis present

## 2021-12-01 DIAGNOSIS — F1910 Other psychoactive substance abuse, uncomplicated: Secondary | ICD-10-CM | POA: Diagnosis present

## 2021-12-01 DIAGNOSIS — M79643 Pain in unspecified hand: Secondary | ICD-10-CM

## 2021-12-01 DIAGNOSIS — Z8614 Personal history of Methicillin resistant Staphylococcus aureus infection: Secondary | ICD-10-CM

## 2021-12-01 DIAGNOSIS — R21 Rash and other nonspecific skin eruption: Secondary | ICD-10-CM | POA: Diagnosis present

## 2021-12-01 DIAGNOSIS — F1120 Opioid dependence, uncomplicated: Secondary | ICD-10-CM | POA: Diagnosis present

## 2021-12-01 DIAGNOSIS — I776 Arteritis, unspecified: Principal | ICD-10-CM | POA: Diagnosis present

## 2021-12-01 DIAGNOSIS — M7989 Other specified soft tissue disorders: Secondary | ICD-10-CM

## 2021-12-01 DIAGNOSIS — L03113 Cellulitis of right upper limb: Secondary | ICD-10-CM | POA: Diagnosis present

## 2021-12-01 DIAGNOSIS — L03119 Cellulitis of unspecified part of limb: Secondary | ICD-10-CM | POA: Diagnosis present

## 2021-12-01 DIAGNOSIS — Z20822 Contact with and (suspected) exposure to covid-19: Secondary | ICD-10-CM | POA: Diagnosis present

## 2021-12-01 DIAGNOSIS — F17210 Nicotine dependence, cigarettes, uncomplicated: Secondary | ICD-10-CM | POA: Diagnosis present

## 2021-12-01 DIAGNOSIS — E871 Hypo-osmolality and hyponatremia: Secondary | ICD-10-CM | POA: Diagnosis present

## 2021-12-01 DIAGNOSIS — Z79899 Other long term (current) drug therapy: Secondary | ICD-10-CM

## 2021-12-01 DIAGNOSIS — R7401 Elevation of levels of liver transaminase levels: Secondary | ICD-10-CM | POA: Diagnosis present

## 2021-12-01 LAB — HEPATIC FUNCTION PANEL
ALT: 10 U/L (ref 0–44)
AST: 13 U/L — ABNORMAL LOW (ref 15–41)
Albumin: 2.3 g/dL — ABNORMAL LOW (ref 3.5–5.0)
Alkaline Phosphatase: 96 U/L (ref 38–126)
Bilirubin, Direct: <0.1 mg/dL (ref 0.0–0.2)
Total Bilirubin: 0.5 mg/dL (ref 0.3–1.2)
Total Protein: 7.5 g/dL (ref 6.5–8.1)

## 2021-12-01 LAB — CBC WITH DIFFERENTIAL/PLATELET
Abs Immature Granulocytes: 0.01 10*3/uL (ref 0.00–0.07)
Basophils Absolute: 0 10*3/uL (ref 0.0–0.1)
Basophils Relative: 0 %
Eosinophils Absolute: 0.1 10*3/uL (ref 0.0–0.5)
Eosinophils Relative: 1 %
HCT: 33.8 % — ABNORMAL LOW (ref 39.0–52.0)
Hemoglobin: 10.3 g/dL — ABNORMAL LOW (ref 13.0–17.0)
Immature Granulocytes: 0 %
Lymphocytes Relative: 30 %
Lymphs Abs: 2.7 10*3/uL (ref 0.7–4.0)
MCH: 23.9 pg — ABNORMAL LOW (ref 26.0–34.0)
MCHC: 30.5 g/dL (ref 30.0–36.0)
MCV: 78.4 fL — ABNORMAL LOW (ref 80.0–100.0)
Monocytes Absolute: 0.6 10*3/uL (ref 0.1–1.0)
Monocytes Relative: 6 %
Neutro Abs: 5.7 10*3/uL (ref 1.7–7.7)
Neutrophils Relative %: 63 %
Platelets: 602 10*3/uL — ABNORMAL HIGH (ref 150–400)
RBC: 4.31 MIL/uL (ref 4.22–5.81)
RDW: 14.6 % (ref 11.5–15.5)
WBC: 9 10*3/uL (ref 4.0–10.5)
nRBC: 0 % (ref 0.0–0.2)

## 2021-12-01 LAB — HEPATITIS PANEL, ACUTE
HCV Ab: REACTIVE — AB
Hep A IgM: NONREACTIVE
Hep B C IgM: NONREACTIVE
Hepatitis B Surface Ag: NONREACTIVE

## 2021-12-01 LAB — LACTIC ACID, PLASMA: Lactic Acid, Venous: 1.7 mmol/L (ref 0.5–1.9)

## 2021-12-01 LAB — BASIC METABOLIC PANEL
Anion gap: 8 (ref 5–15)
BUN: 7 mg/dL (ref 6–20)
CO2: 30 mmol/L (ref 22–32)
Calcium: 9.1 mg/dL (ref 8.9–10.3)
Chloride: 95 mmol/L — ABNORMAL LOW (ref 98–111)
Creatinine, Ser: 0.92 mg/dL (ref 0.61–1.24)
GFR, Estimated: >60 mL/min (ref >60)
Glucose, Bld: 129 mg/dL — ABNORMAL HIGH (ref 70–99)
Potassium: 4.4 mmol/L (ref 3.5–5.1)
Sodium: 133 mmol/L — ABNORMAL LOW (ref 135–145)

## 2021-12-01 LAB — RESP PANEL BY RT-PCR (FLU A&B, COVID) ARPGX2
Influenza A by PCR: NEGATIVE
Influenza B by PCR: NEGATIVE
SARS Coronavirus 2 by RT PCR: NEGATIVE

## 2021-12-01 LAB — RAPID HIV SCREEN (HIV 1/2 AB+AG)
HIV 1/2 Antibodies: NONREACTIVE
HIV-1 P24 Antigen - HIV24: NONREACTIVE

## 2021-12-01 LAB — SEDIMENTATION RATE: Sed Rate: 72 mm/hr — ABNORMAL HIGH (ref 0–16)

## 2021-12-01 IMAGING — DX DG HAND 2V*R*
2 series · 2 of 2 positions shown · non-contrast
Comparison: None.

CLINICAL DATA: Skin infection.  Swelling of hand.  Pain.

EXAM:
RIGHT HAND - 2 VIEW

[hand pa]
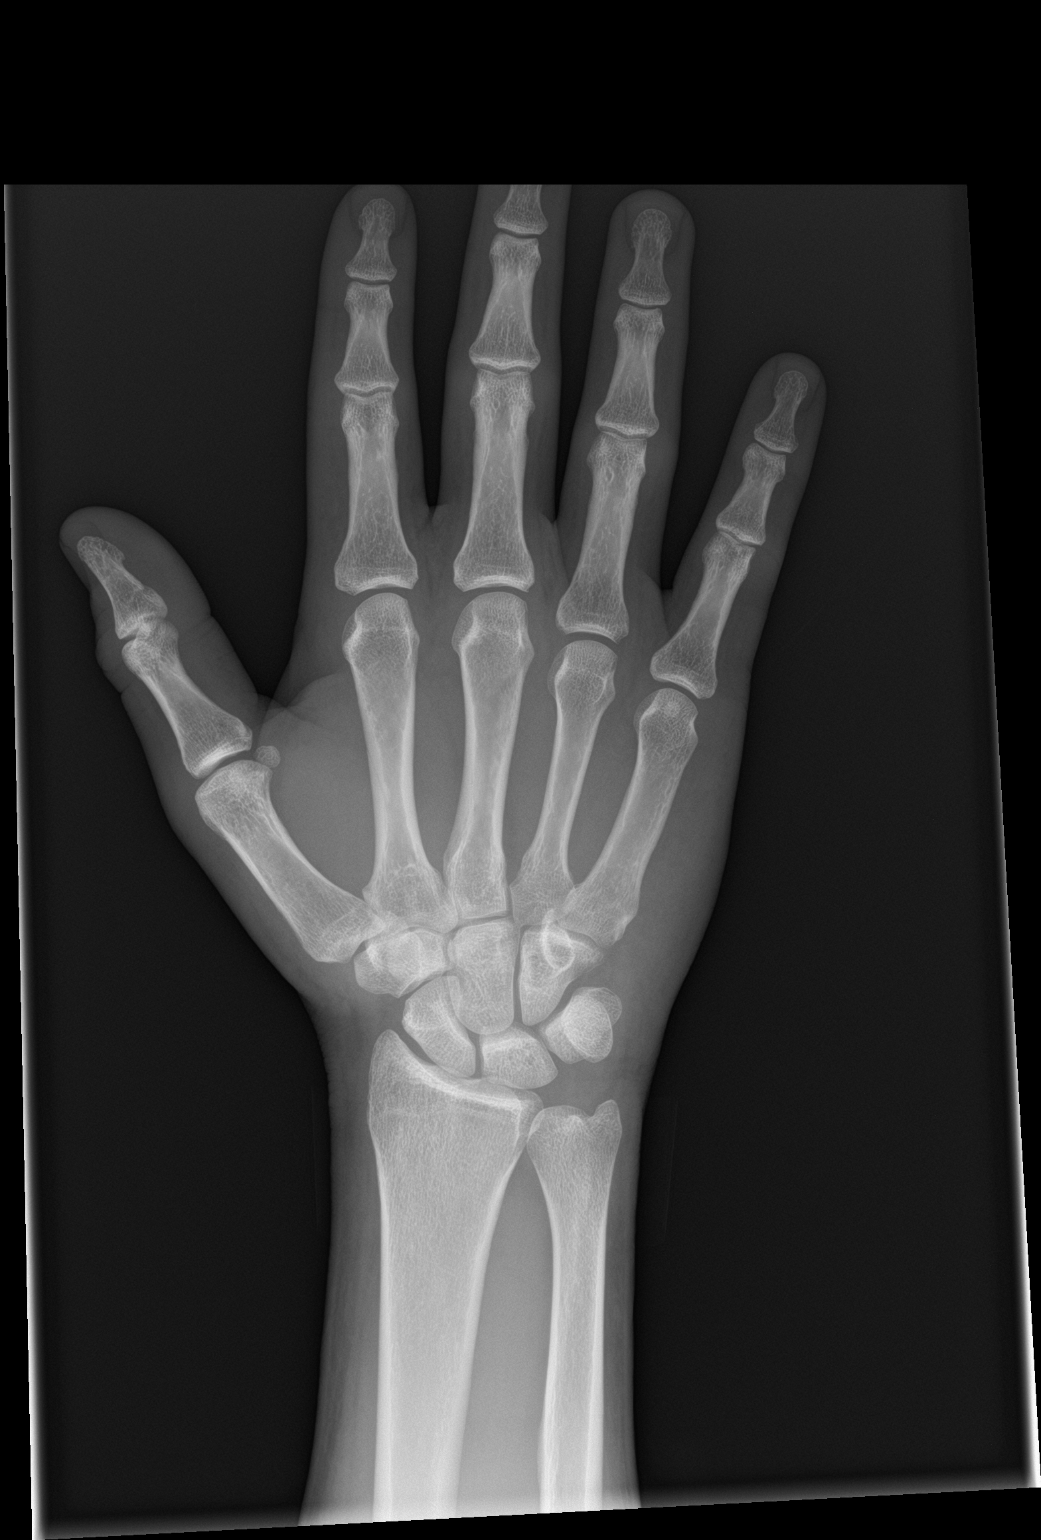

[hand lat]
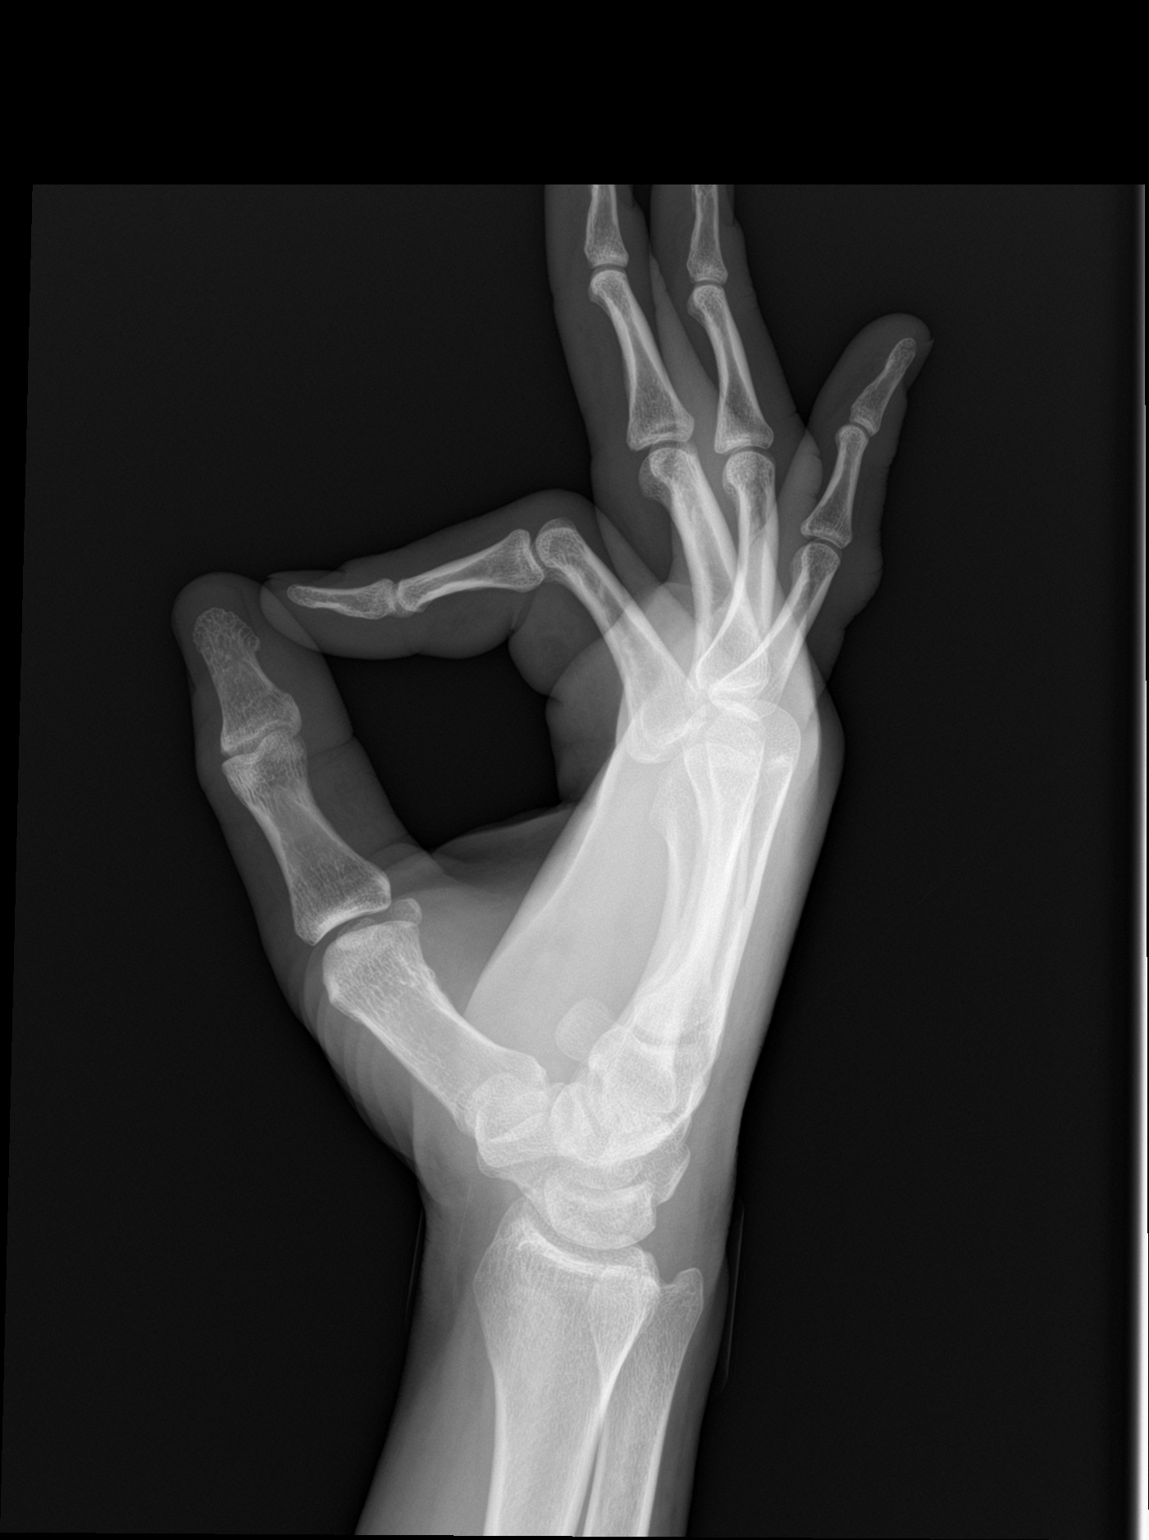

[2 of 2 positions shown; findings below may reference images not displayed]

FINDINGS: There is no evidence of fracture or dislocation. No erosion,
periosteal reaction, or bone destruction. There is no evidence of
arthropathy or other focal bone abnormality. Dorsal soft tissue
edema. No soft tissue air. No radiopaque foreign body.
IMPRESSION: Dorsal soft tissue edema. No radiographic findings of osteomyelitis.

## 2021-12-01 MED ORDER — HYDROCORTISONE 1 % EX CREA: TOPICAL_CREAM | Freq: Two times a day (BID) | CUTANEOUS | Status: DC

## 2021-12-01 MED ORDER — ALBUTEROL SULFATE (2.5 MG/3ML) 0.083% IN NEBU: 2.5000 mg | INHALATION_SOLUTION | Freq: Four times a day (QID) | RESPIRATORY_TRACT | Status: DC | PRN

## 2021-12-01 MED ORDER — SODIUM CHLORIDE 0.9 % IV SOLN
1.0000 g | Freq: Once | INTRAVENOUS | Status: AC
  Administered 2021-12-01: 16:00:00 1 g via INTRAVENOUS
  Filled 2021-12-01: qty 10

## 2021-12-01 MED ORDER — SODIUM CHLORIDE 0.9% FLUSH
3.0000 mL | Freq: Two times a day (BID) | INTRAVENOUS | Status: DC
  Administered 2021-12-01 – 2021-12-03 (×4): 3 mL via INTRAVENOUS

## 2021-12-01 MED ORDER — CLOBETASOL PROPIONATE 0.05 % EX OINT
TOPICAL_OINTMENT | Freq: Two times a day (BID) | CUTANEOUS | Status: DC
  Filled 2021-12-01: qty 15

## 2021-12-01 MED ORDER — ONDANSETRON HCL 4 MG PO TABS: 4.0000 mg | ORAL_TABLET | Freq: Four times a day (QID) | ORAL | Status: DC | PRN

## 2021-12-01 MED ORDER — METHADONE HCL 10 MG PO TABS
100.0000 mg | ORAL_TABLET | Freq: Every day | ORAL | Status: DC
Start: 2021-12-02 — End: 2021-12-03
  Administered 2021-12-02 – 2021-12-03 (×2): 100 mg via ORAL
  Filled 2021-12-01 (×2): qty 10

## 2021-12-01 MED ORDER — HYDRALAZINE HCL 20 MG/ML IJ SOLN: 5.0000 mg | Freq: Four times a day (QID) | INTRAMUSCULAR | Status: DC | PRN

## 2021-12-01 MED ORDER — DIPHENHYDRAMINE HCL 50 MG/ML IJ SOLN: 12.5000 mg | Freq: Four times a day (QID) | INTRAMUSCULAR | Status: DC | PRN

## 2021-12-01 MED ORDER — ACETAMINOPHEN 325 MG PO TABS
650.0000 mg | ORAL_TABLET | Freq: Four times a day (QID) | ORAL | Status: DC | PRN
  Administered 2021-12-01: 650 mg via ORAL
  Filled 2021-12-01: qty 2

## 2021-12-01 MED ORDER — VANCOMYCIN HCL 1500 MG/300ML IV SOLN
1500.0000 mg | Freq: Once | INTRAVENOUS | Status: AC
  Administered 2021-12-01: 17:00:00 1500 mg via INTRAVENOUS
  Filled 2021-12-01: qty 300

## 2021-12-01 MED ORDER — ENOXAPARIN SODIUM 40 MG/0.4ML IJ SOSY
40.0000 mg | PREFILLED_SYRINGE | INTRAMUSCULAR | Status: DC
  Administered 2021-12-02: 40 mg via SUBCUTANEOUS
  Filled 2021-12-01: qty 0.4

## 2021-12-01 MED ORDER — ONDANSETRON HCL 4 MG/2ML IJ SOLN: 4.0000 mg | Freq: Four times a day (QID) | INTRAMUSCULAR | Status: DC | PRN

## 2021-12-01 MED ORDER — ACETAMINOPHEN 650 MG RE SUPP: 650.0000 mg | Freq: Four times a day (QID) | RECTAL | Status: DC | PRN

## 2021-12-01 MED ORDER — VANCOMYCIN HCL 1250 MG/250ML IV SOLN
1250.0000 mg | Freq: Two times a day (BID) | INTRAVENOUS | Status: DC
  Administered 2021-12-02: 1250 mg via INTRAVENOUS
  Filled 2021-12-01 (×3): qty 250

## 2021-12-01 MED ORDER — SODIUM CHLORIDE 0.9 % IV SOLN: 2.0000 g | INTRAVENOUS | Status: DC

## 2021-12-01 NOTE — ED Notes (Signed)
Unsuccessful IV attempt.  Will consult IV team.  °

## 2021-12-01 NOTE — H&P (Addendum)
?History and Physical  ? ? ?Patient: Duane Price QVZ:[id number] DOB: 1986-03-24 ?DOA: 12/01/2021 ?DOS: the patient was seen and examined on 12/01/2021 ?PCP: Pcp, No  ?Patient coming from: Homeless ? ?Chief Complaint:  ?Chief Complaint  ?Patient presents with  ? Hand Pain  ? ?HPI: Duane Price is a 36 y.o. male with medical history significant of heroin abuse, MRSA bacteremia, tricuspid valve endocarditis with septic emboli,  osteomyelitis/discitis, and homelessness presents with complaints of pain and swelling of his hands.  Patient reports symptoms started 2-3 weeks ago with itching on the bottom of his feet and palms of his hands.  He broke out in this whole body rash, but makes it seem that this is happened 2-3 times previously in the past when he was doing IV drugs.  Previously, he could take Benadryl and symptoms would resolve.  He was seen in the ED on 3/2 for the symptoms.  He was evaluated [facility] spotted fever antibodies which were positive for the IgG but negative for IgM.  MRI of the thoracic and lumbar spine were performed and noted progression to of height loss of T12-L1 thought to be consistent with sequela of osteomyelitis-discitis along moderate spinal canal stenosis.  Blood cultures have been obtained during this evaluation and noted to be negative.  Patient was diagnosed with possible cellulitis and discharged with doxycycline and pyrethrin cream.  He did not take the permethrin cream as he knew this was not scabies, but was taking doxycycline.  He reports that there itching persisted, rash spread, and he developed bruising of his hands that with increased redness and swelling.  He reports that he developed a burning sensation on the top of his hand and it feels like something is underneath his skin.  He admits that he has been scratching all over due to the symptoms.  He denies any IV drug use for a couple of months and never injected into his hands.  He reports that he is on methadone 100 mg  and has been taking this for 5 months now. ? ?Upon admission into the emergency department patient was noted to be afebrile with stable vital signs.  Labs noted hemoglobin 10.3 with low MCV and MCH, platelets 602, sodium 133, and lactic acid 1.7.  Patient has been given empiric antibiotics of vancomycin and Rocephin IV for concern for cellulitis. ? ?Review of Systems: As mentioned in the history of present illness. All other systems reviewed and are negative. ?Past Medical History:  ?Diagnosis Date  ? IV drug user   ? MRSA bacteremia   ? Smoker   ? ?Past Surgical History:  ?Procedure Laterality Date  ? APPLICATION OF ANGIOVAC N/A 03/29/2021  ? Procedure: APPLICATION OF [place];  Surgeon: Lajuana Matte, MD;  Location: [place];  Service: Vascular;  Laterality: N/A;  ? BRAIN SURGERY    ? facial surgery    ? ?Social History:  reports that he has been smoking cigarettes. He has been smoking an average of .5 packs per day. He has never used smokeless tobacco. He reports that he does not currently use alcohol. He reports current drug use. Drugs: Cocaine and IV. ? ?No Known Allergies ? ?Family History  ?Family history unknown: Yes  ? ? ?Prior to Admission medications   ?Medication Sig Start Date End Date Taking? Authorizing Provider  ?acetaminophen (TYLENOL) 500 MG tablet Take 1 tablet (500 mg total) by mouth every 6 (six) hours as needed. ?Patient not taking: Reported on 11/24/2021 06/17/21   Redwine,  [place] A, PA-C  ?apixaban (ELIQUIS) 5 MG TABS tablet Take 1 tablet (5 mg total) by mouth 2 (two) times daily. ?Patient not taking: Reported on 06/13/2021 04/25/21   Hosie Poisson, MD  ?doxycycline (VIBRAMYCIN) 100 MG capsule Take 1 capsule (100 mg total) by mouth 2 (two) times daily. 11/25/21   Tegeler, Gwenyth Allegra, MD  ?hydrOXYzine (ATARAX/VISTARIL) 25 MG tablet Take 1 tablet (25 mg total) by mouth 3 (three) times daily as needed for anxiety. ?Patient not taking: Reported on 06/13/2021 04/22/21   Hosie Poisson, MD  ?ibuprofen  (ADVIL) 600 MG tablet Take 1 tablet (600 mg total) by mouth every 6 (six) hours as needed. ?Patient not taking: Reported on 11/24/2021 06/17/21   Redwine, [place], PA-C  ?METHADONE HCL PO Take by mouth. ?Patient not taking: Reported on 11/24/2021    [provider]  ?pantoprazole (PROTONIX) 40 MG tablet Take 1 tablet (40 mg total) by mouth daily. ?Patient not taking: Reported on 06/13/2021 04/23/21 06/13/21  Hosie Poisson, MD  ?permethrin (ELIMITE) 5 % cream Apply 5% cream to entire body from the neck down, then wash off after 8 to 14 hours (guideline dosage) 11/25/21   Tegeler, Gwenyth Allegra, MD  ? ? ?Physical Exam: ?Vitals:  ? 12/01/21 1022 12/01/21 1023 12/01/21 1300  ?BP: 114/71    ?Pulse:  93   ?Resp: 16    ?Temp: (!) 97.5 ?F (36.4 ?C)    ?TempSrc: Oral    ?SpO2:  99%   ?Weight:   68.4 kg  ? ?Exam ? ?Constitutional: Middle-age male who appears to be in some distress ?Eyes: PERRL, lids and conjunctivae normal ?ENMT: Mucous membranes are moist.   ?Neck: normal, supple, no masses, no thyromegaly ?Respiratory: clear to auscultation bilaterally, no wheezing, no crackles. Normal respiratory effort. No accessory muscle use.  ?Cardiovascular: Regular rate and rhythm, no murmurs / rubs / gallops.  ?Abdomen: no tenderness, no masses palpated.   Bowel sounds positive.  ?Musculoskeletal: no clubbing / cyanosis.  ?Skin: Patient with a diffuse papular /plaque like rash of the upper and lower extremities.  Erythema with increased warmth, swelling most notably of the right hand more so than left. ? ? ? ?Neurologic: CN 2-12 grossly intact. Strength 5/5 in all 4.  ?Psychiatric: Normal judgment and insight. Alert and oriented x 3. Normal mood.  ? ?Data Reviewed: ? ? ? ?Assessment and Plan: ?* Rash ?Patient presents with progressively worsening papular/plaque itchy rash of his whole body.  Patient reports similar symptoms in the last 5 years, but reports symptoms previously  resolved with Benadryl.  [facility] spotted fever IgG  antibodies were positive, but IgM are negative.  Question possibility of Lichen planus. ?-Admit to medical telemetry bed ?-Check ESR, CRP, RPR, hepatitis panel, HIV  ?-Benadryl as needed for itching ?-Clobetasol ointment to rash ?-Needs referral to dermatology ? ?Cellulitis of hand ?Acute.  Due to itchy rash patient has been scratching and appears to have possibly developed a cellulitis of the hand.  He had been prescribed doxycycline, but had no improvement in symptoms.  Patient has been started on empiric antibiotics of vancomycin and Rocephin.  He is otherwise afebrile without signs of leukocytosis.  Had been on doxycycline without any improvement. ?-Check x-rays of the hand ?-Continue antibiotics of Rocephin ? ?Elevated AST (SGOT) ?Last check AST was 49 on 3/2. Prior history of being positive for Hep C. ?-Add on liver function studies ?-Follow-up hepatitis panel ? ?Hyponatremia ?Chronic.  Sodium 133 on admission which appears similar to  previous. ?-Continue to monitor ? ?Thrombocytosis ?Acute on chronic.  Platelet count 602 on admission. ?-Recheck CBC in ? ?Homeless ?-TOC ? ?IV drug abuse ([place]) ?Patient reports that he has not used IV drugs in months.  He is currently on methadone 100 mg daily. ?-Continue methadone ? ? ? ? ? Advance Care Planning:   Code Status: Full Code  ? ?Consults: None ?Family Communication: None ? ?Severity of Illness: ?The appropriate patient status for this patient is OBSERVATION. Observation status is judged to be reasonable and necessary in order to provide the required intensity of service to ensure the patient's safety. The patient's presenting symptoms, physical exam findings, and initial radiographic and laboratory data in the context of their medical condition is felt to place them at decreased risk for further clinical deterioration. Furthermore, it is anticipated that the patient will be medically stable for discharge from the hospital within 2 midnights of admission.   ? ?Author: ?Norval Morton, MD ?12/01/2021 2:19 PM ? ?For on call review [email or web address].  ?

## 2021-12-01 NOTE — ED Provider Triage Note (Signed)
Emergency Medicine Provider Triage Evaluation Note ? ?Duane Price , a 36 y.o. male  was evaluated in triage.  Pt complains of skin infection. Has a rash to arms and legs over 1 week.  Has redness to dorsum of hands bilaterally.  Report rash started before doxycycline recently prescribed.  Voice concerns of worsening infection despite on abx.  Hx IVDU ? ?Review of Systems  ?Positive: Pain and redness to hands, rash to body ?Negative: Fever, chills ? ?Physical Exam  ?BP 114/71 (BP Location: Left Arm)   Pulse 93   Temp (!) 97.5 ?F (36.4 ?C) (Oral)   Resp 16   SpO2 99%  ?Gen:   Awake, no distress   ?Resp:  Normal effort  ?MSK:   Moves extremities without difficulty  ?Other:  Erythematous rash noted to body ? ?Medical Decision Making  ?Medically screening exam initiated at 10:21 AM.  Appropriate orders placed.  Schneur Crowson was informed that the remainder of the evaluation will be completed by another provider, this initial triage assessment does not replace that evaluation, and the importance of remaining in the ED until their evaluation is complete. ? ? ?  ?Fayrene Helper, PA-C ?12/01/21 1031 ? ?

## 2021-12-01 NOTE — Assessment & Plan Note (Addendum)
Acute.  Due to itchy rash patient has been scratching and appears to have possibly developed a cellulitis of the hand.  He had been prescribed doxycycline, but had had a improvement in symptoms.  Patient has been started on empiric antibiotics of vancomycin and Rocephin.  He is otherwise afebrile without signs of leukocytosis.  Had been on doxycycline without any improvement. ?-Check x-rays of the hand ?-Continue antibiotics of Rocephin ?

## 2021-12-01 NOTE — Assessment & Plan Note (Addendum)
Patient presents with progressively worsening papular/plaque itchy rash of his whole body.  Patient reports similar symptoms in the last 5 years, but reports symptoms previously  resolved with Benadryl.  [facility] spotted fever IgG antibodies were positive, but IgM are negative.  Question possibility of Lichen planus, ?-Check ESR, CRP, RPR, hepatitis panel, HIV  ?-Benadryl as needed for itching ?-Clobetasol ointment to rash ?-Needs referral to dermatology ? ?

## 2021-12-01 NOTE — ED Triage Notes (Signed)
Patient here with complaint of bilateral hand pain that started approximately one week, pain is described as a burning sensation. Patient alert, oriented, and in no apparent distress at this time. ?

## 2021-12-01 NOTE — Assessment & Plan Note (Signed)
Last check AST was 49 on 3/2. ?-Add on liver function studies. ?

## 2021-12-01 NOTE — Progress Notes (Addendum)
Pharmacy Antibiotic Note ? ?Duane Price is a 36 y.o. male admitted on 12/01/2021 with cellulitis.  Pt has failed oral regimen of doxycycline outpatient prior to presentation to ED on 3/9. 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 Pharmacy has been consulted for vancomycin dosing. ? ?Plan: ?AUC goal 400-550 ?Vancomycin 1500mg  IV x 1 , followed by  ?Vancomycin 1250mg  IV q12h (estimated AUC ~528) ?Monitor CBC, SCr and temperature daily ? ? ?Weight: 68.4 kg (150 lb 11.2 oz) ? ?Temp (24hrs), Avg:97.5 ?F (36.4 ?C), Min:97.5 ?F (36.4 ?C), Max:97.5 ?F (36.4 ?C) ? ?Recent Labs  ?Lab 11/24/21 ?2026 11/25/21 ?0409 12/01/21 ?1036  ?WBC 10.0  --  9.0  ?CREATININE 0.97 0.83 0.92  ?LATICACIDVEN  --   --  1.7  ?  ?Estimated Creatinine Clearance: 110.5 mL/min (by C-G formula based on SCr of 0.92 mg/dL).   ? ?No Known Allergies ? ?Antimicrobials this admission: ?Ceftriaxone 3/9 >>  ?Vancomycin 3/9 >>  ? ?Dose adjustments this admission: ?None ? ?Microbiology results: ?3/2 Bld Cx: ngtd ? ?Thank you for allowing pharmacy to be a part of this patient?s care. ? ?Kaleen Mask ?12/01/2021 1:19 PM ? ?

## 2021-12-01 NOTE — Assessment & Plan Note (Signed)
Acute on chronic.  Platelet count 602 on admission. ?

## 2021-12-01 NOTE — Assessment & Plan Note (Signed)
TOC

## 2021-12-01 NOTE — Assessment & Plan Note (Signed)
Chronic.  Sodium 133 on admission which appears similar to previous. ?

## 2021-12-01 NOTE — ED Provider Notes (Signed)
[facility] EMERGENCY DEPARTMENT Provider Note   CSN: [id number] Arrival date & time: 12/01/21  1012     History  Chief Complaint  Patient presents with   Hand Pain    Duane Price is a 36 y.o. male.  Patient with history of heroin abuse, MRSA bacteremia, and tricuspid valve endocarditis and T12-L1 osteomyelitis presents today with rash and pain to bilateral upper and lower extremities. He states that same has been ongoing for the past 3 weeks when he woke up suddenly and felt that his lips were swelling and he had a rash that subsequently developed on his bilateral upper and lower extremities including his palms and soles.  He states that initially the rash was quite itchy, but that has since improved. Itching is now mostly on his feet. He states that he has had similar 'flares' of this in the past, but states that he normally takes Benadryl and it goes away. No known exposures or tick bites. He was seen here on 3/2 for same with extensive work-up involving blood cultures that did not have growth and RMSF that showed positive IgG and negative IgM. He was discharged with doxycycline for coverage of cellulitis and RMSF as this was still pending at the patient's discharge. He was also given permethrin cream as his previous provider was concerned that this could be scabies. Patient states that he is taken his entire course of doxycycline as prescribed with worsening of his symptoms.  He did not feel the permethrin cream as he disagreed with clinicians feeling that this could be scabies.  Additionally, he specifically states that the swelling and redness of his hands specifically the right have gotten worse.  He states that they are now significantly more red than they were previously and are swollen.  Of note, patient with significant history of IVDU, however denies any drug use in the past 3 months. He denies fevers, chills, chest pain, shortness of breath, nausea, vomiting,  diarrhea, abdominal pain. Also, of note patient did endorse severe back pain when he was here previously for which she received MRIs with concern for epidural abscess.  These were negative and patient states that his back pain has completely resolved.  The history is provided by the patient. No language interpreter was used.  Hand Pain Pertinent negatives include no chest pain, no abdominal pain and no shortness of breath.      Home Medications Prior to Admission medications   Medication Sig Start Date End Date Taking? Authorizing Provider  acetaminophen (TYLENOL) 500 MG tablet Take 1 tablet (500 mg total) by mouth every 6 (six) hours as needed. Patient not taking: Reported on 11/24/2021 06/17/21   Redwine, Madison A, PA-C  apixaban (ELIQUIS) 5 MG TABS tablet Take 1 tablet (5 mg total) by mouth 2 (two) times daily. Patient not taking: Reported on 06/13/2021 04/25/21   Hosie Poisson, MD  doxycycline (VIBRAMYCIN) 100 MG capsule Take 1 capsule (100 mg total) by mouth 2 (two) times daily. 11/25/21   Tegeler, Gwenyth Allegra, MD  hydrOXYzine (ATARAX/VISTARIL) 25 MG tablet Take 1 tablet (25 mg total) by mouth 3 (three) times daily as needed for anxiety. Patient not taking: Reported on 06/13/2021 04/22/21   Hosie Poisson, MD  ibuprofen (ADVIL) 600 MG tablet Take 1 tablet (600 mg total) by mouth every 6 (six) hours as needed. Patient not taking: Reported on 11/24/2021 06/17/21   Redwine, Madison A, PA-C  METHADONE HCL PO Take by mouth. Patient not taking: Reported on 11/24/2021  [provider]  pantoprazole (PROTONIX) 40 MG tablet Take 1 tablet (40 mg total) by mouth daily. Patient not taking: Reported on 06/13/2021 04/23/21 06/13/21  Hosie Poisson, MD  permethrin (ELIMITE) 5 % cream Apply 5% cream to entire body from the neck down, then wash off after 8 to 14 hours (guideline dosage) 11/25/21   Tegeler, Gwenyth Allegra, MD      Allergies    Patient has no known allergies.    Review of Systems   Review of  Systems  Constitutional:  Negative for chills and fever.  Respiratory:  Negative for shortness of breath.   Cardiovascular:  Negative for chest pain.  Gastrointestinal:  Negative for abdominal pain, diarrhea, nausea and vomiting.  Skin:  Positive for rash and wound.  All other systems reviewed and are negative.    Physical Exam Updated Vital Signs BP 114/71 (BP Location: Left Arm)    Pulse 93    Temp (!) 97.5 F (36.4 C) (Oral)    Resp 16    SpO2 99%  Physical Exam Vitals and nursing note reviewed.  Constitutional:      General: He is not in acute distress.    Appearance: Normal appearance. He is normal weight. He is not ill-appearing, toxic-appearing or diaphoretic.     Comments: Patient resting comfortably in bed in no acute distress  HENT:     Head: Normocephalic and atraumatic.  Eyes:     Extraocular Movements: Extraocular movements intact.     Pupils: Pupils are equal, round, and reactive to light.  Cardiovascular:     Rate and Rhythm: Normal rate and regular rhythm.     Heart sounds: Murmur heard.  Pulmonary:     Effort: Pulmonary effort is normal. No respiratory distress.     Breath sounds: Normal breath sounds.  Abdominal:     General: Abdomen is flat.     Palpations: Abdomen is soft.     Tenderness: There is no abdominal tenderness.  Musculoskeletal:        General: Normal range of motion.     Cervical back: Normal range of motion.  Skin:    General: Skin is warm and dry.     Comments: Erythema and warmth noted to bilateral upper and lower extremities with edema noted as well particularly to the right hand. No specific area of fluctuance or induration present.   Diffuse erythematous maculopapular rash present on bilateral upper and lower extremities with patches located on the shins of the bilateral lower legs. It it also prsent on the palms and soles. Spares trunk and face. See images for further  Neurological:     General: No focal deficit present.     Mental  Status: He is alert.  Psychiatric:        Mood and Affect: Mood normal.        Behavior: Behavior normal.       ED Results / Procedures / Treatments   Labs (all labs ordered are listed, but only abnormal results are displayed) Labs Reviewed  CBC WITH DIFFERENTIAL/PLATELET - Abnormal; Notable for the following components:      Result Value   Hemoglobin 10.3 (*)    HCT 33.8 (*)    MCV 78.4 (*)    MCH 23.9 (*)    Platelets 602 (*)    All other components within normal limits  BASIC METABOLIC PANEL - Abnormal; Notable for the following components:   Sodium 133 (*)    Chloride 95 (*)  Glucose, Bld 129 (*)    All other components within normal limits  LACTIC ACID, PLASMA  LACTIC ACID, PLASMA    EKG None  Radiology No results found.  Procedures Procedures    Medications Ordered in ED Medications  cefTRIAXone (ROCEPHIN) 1 g in sodium chloride 0.9 % 100 mL IVPB (has no administration in time range)  vancomycin (VANCOREADY) IVPB 1500 mg/300 mL (has no administration in time range)  vancomycin (VANCOREADY) IVPB 1250 mg/250 mL (has no administration in time range)    ED Course/ Medical Decision Making/ A&P                           Medical Decision Making Amount and/or Complexity of Data Reviewed Labs: ordered.  Risk Prescription drug management. Decision regarding hospitalization.   This patient presents to the ED for concern of rash, hand pain, worsening cellulitis, this involves an extensive number of treatment options, and is a complaint that carries with it a high risk of complications and morbidity.  The differential diagnosis includes contact dermatitis, cellulitis, HIV, HCV. This list is not comprehensive   Co morbidities that complicate the patient evaluation  Hx of MRSA bacteremia, osteomyelitis of the spine, IVDU, endocarditis   Additional history obtained:  Additional history obtained from previous ER notes   Lab Tests:  I Ordered, and  personally interpreted labs.  The pertinent results include:  no leukocytosis, anemia unchanged from baseline. Lactic acid 1.7   Medicines ordered and prescription drug management:  I ordered medication including Vancomycin and Rocephin  for cellulitis following failed treatment with doxycycline    Consultations Obtained:  I requested consultation with the pharmacy for antibiotic recommendations ,  and discussed lab and imaging findings as well as pertinent plan - they recommend: vancomycin and rocephin for concern of MRSA bacteremia given history of same   Social Determinants of Health:  Hx of IVDU, homelessness. No pcp  Patient returns to the ER today for persistent worsening rash after failing oral antibiotics for cellulitis. Patient now has worsening erythema and edema to his bilateral hands and feet with associated warmth. Blood cultures negative, no leukocytosis, patient afebrile. Unclear etiology for the patients rash, however definitely feel there is a cellulitic component. However, given patients failure of oral antibiotics with significant history of bacteremia in the setting of high risk patient. Feel patient requires admission for IV antibiotics, close monitoring, and likely infectious disease consult for further evaluation and management. Patient is understanding and amenable with plan.  Discussed patient with hospitalist who agrees to admit.   This is a shared visit with supervising physician Dr. Sherry Ruffing who has independently evaluated patient & provided guidance in evaluation/management/disposition, in agreement with care     Final Clinical Impression(s) / ED Diagnoses Final diagnoses:  Cellulitis of right upper extremity  Rash and nonspecific skin eruption    Rx / DC Orders ED Discharge Orders     None         Nestor Lewandowsky 12/02/21 1258    Tegeler, Gwenyth Allegra, MD 12/02/21 1540

## 2021-12-01 NOTE — Assessment & Plan Note (Addendum)
Patient reports that he has not used IV drugs in months.  He is currently on methadone 100 mg daily. ?-Continue methadone ?

## 2021-12-02 ENCOUNTER — Encounter (HOSPITAL_COMMUNITY): Payer: Self-pay | Admitting: Internal Medicine

## 2021-12-02 ENCOUNTER — Other Ambulatory Visit (HOSPITAL_COMMUNITY): Payer: Self-pay

## 2021-12-02 ENCOUNTER — Other Ambulatory Visit: Payer: Self-pay

## 2021-12-02 LAB — BASIC METABOLIC PANEL
Anion gap: 8 (ref 5–15)
BUN: 11 mg/dL (ref 6–20)
CO2: 29 mmol/L (ref 22–32)
Calcium: 8.7 mg/dL — ABNORMAL LOW (ref 8.9–10.3)
Chloride: 98 mmol/L (ref 98–111)
Creatinine, Ser: 1.03 mg/dL (ref 0.61–1.24)
GFR, Estimated: >60 mL/min (ref >60)
Glucose, Bld: 105 mg/dL — ABNORMAL HIGH (ref 70–99)
Potassium: 4.2 mmol/L (ref 3.5–5.1)
Sodium: 135 mmol/L (ref 135–145)

## 2021-12-02 LAB — RAPID URINE DRUG SCREEN, HOSP PERFORMED
Amphetamines: NOT DETECTED
Barbiturates: NOT DETECTED
Benzodiazepines: NOT DETECTED
Cocaine: NOT DETECTED
Opiates: NOT DETECTED
Tetrahydrocannabinol: NOT DETECTED

## 2021-12-02 LAB — CBC
HCT: 29.1 % — ABNORMAL LOW (ref 39.0–52.0)
Hemoglobin: 9.2 g/dL — ABNORMAL LOW (ref 13.0–17.0)
MCH: 24.2 pg — ABNORMAL LOW (ref 26.0–34.0)
MCHC: 31.6 g/dL (ref 30.0–36.0)
MCV: 76.6 fL — ABNORMAL LOW (ref 80.0–100.0)
Platelets: 443 10*3/uL — ABNORMAL HIGH (ref 150–400)
RBC: 3.8 MIL/uL — ABNORMAL LOW (ref 4.22–5.81)
RDW: 14.6 % (ref 11.5–15.5)
WBC: 9.4 10*3/uL (ref 4.0–10.5)
nRBC: 0 % (ref 0.0–0.2)

## 2021-12-02 LAB — RPR: RPR Ser Ql: NONREACTIVE

## 2021-12-02 LAB — MRSA NEXT GEN BY PCR, NASAL: MRSA by PCR Next Gen: DETECTED — AB

## 2021-12-02 LAB — URINALYSIS, ROUTINE W REFLEX MICROSCOPIC
Bacteria, UA: NONE SEEN
Bilirubin Urine: NEGATIVE
Glucose, UA: NEGATIVE mg/dL
Ketones, ur: NEGATIVE mg/dL
Leukocytes,Ua: NEGATIVE
Nitrite: NEGATIVE
Protein, ur: NEGATIVE mg/dL
Specific Gravity, Urine: 1.011 (ref 1.005–1.030)
pH: 7 (ref 5.0–8.0)

## 2021-12-02 LAB — C-REACTIVE PROTEIN: CRP: 6 mg/dL — ABNORMAL HIGH (ref <1.0)

## 2021-12-02 MED ORDER — PREDNISONE 20 MG PO TABS: 20.0000 mg | ORAL_TABLET | Freq: Every day | ORAL | Status: DC

## 2021-12-02 MED ORDER — PREDNISONE 20 MG PO TABS
20.0000 mg | ORAL_TABLET | Freq: Every day | ORAL | Status: DC
  Administered 2021-12-02: 20 mg via ORAL
  Filled 2021-12-02: qty 1

## 2021-12-02 MED ORDER — PANTOPRAZOLE SODIUM 40 MG PO TBEC: 40.0000 mg | DELAYED_RELEASE_TABLET | Freq: Every day | ORAL | 0 refills | Status: DC

## 2021-12-02 MED ORDER — PREDNISONE 10 MG PO TABS
20.0000 mg | ORAL_TABLET | Freq: Every day | ORAL | 0 refills | Status: AC
  Filled 2021-12-02: qty 10, 5d supply, fill #0

## 2021-12-02 MED ORDER — METHYLPREDNISOLONE SODIUM SUCC 40 MG IJ SOLR
40.0000 mg | Freq: Every day | INTRAMUSCULAR | Status: DC
  Administered 2021-12-02 – 2021-12-03 (×2): 40 mg via INTRAVENOUS
  Filled 2021-12-02 (×2): qty 1

## 2021-12-02 MED ORDER — PANTOPRAZOLE SODIUM 40 MG PO TBEC
40.0000 mg | DELAYED_RELEASE_TABLET | Freq: Every day | ORAL | 0 refills | Status: AC
  Filled 2021-12-02: qty 30, 30d supply, fill #0

## 2021-12-02 NOTE — Progress Notes (Signed)
PROGRESS NOTE    Duane Price  [id number] DOB: [birth year] DOA: 12/01/2021 PCP: Pcp, No    Chief Complaint  Patient presents with   Hand Pain    Brief Narrative:    Duane Metzhomas Orsborn is a 36 y.o. male with medical history significant of heroin abuse, MRSA bacteremia, tricuspid valve endocarditis with septic emboli,  osteomyelitis/discitis, and homelessness presents with complaints of pain and swelling of his hands.  Patient reports symptoms started 2-3 weeks ago with itching on the bottom of his feet and palms of his hands, and worsening diffuse maculopapular rash on extremities.  Assessment & Plan:   Principal Problem:   Rash Active Problems:   Cellulitis of hand   IV drug abuse (HCC)   Homeless   Thrombocytosis   Hyponatremia   Elevated AST (SGOT)  Rash/bilateral hand erythema -Low clinical suspicious for infectious process, ID input greatly appreciated, there is more concern for actual inflammatory process like vasculitis. -We will start on steroids, will give IV Solu-Medrol today and likely transition to steroids tomorrow -IV Rocephin and vancomycin has been stopped. -Get if for hematuria -Ig M RMSF is nonreactive  Hyponatremia Improved with IV fluids   Thrombocytosis Acute on chronic.  Platelet count 602 on admission.    Homeless -TOC   IV drug abuse [facility](HCC) Patient reports that he has not used IV drugs in months.  He is currently on methadone 100 mg daily. 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      DVT prophylaxis: Lovenox Code Status: Full Family Communication: none at bedside Disposition:   Status is: Observation The patient remains OBS appropriate and will d/c before 2 midnights.   Consultants:  ID    Subjective:  He denies any fever or hills, he reports itching has improved.  Objective: Vitals:   12/01/21  1950 12/02/21 0400 12/02/21 0747 12/02/21 1223  BP: 126/64 97/62 108/66 (!) 100/58  Pulse: 69 61 78 77  Resp: 15 17 15 18   Temp: 98.2 F (36.8 C) 97.9 F (36.6 C) 98.6 F (37 C) 98.4 F (36.9 C)  TempSrc: Oral Oral Oral Oral  SpO2: 98% 98% 100% 97%  Weight: 70.3 kg     Height: 6' (1.829 m)       Intake/Output Summary (Last 24 hours) at 12/02/2021 1435 Last data filed at 12/02/2021 0749 Gross per 24 hour  Intake 250 ml  Output 1600 ml  Net -1350 ml   Filed Weights   12/01/21 1300 12/01/21 1950  Weight: 68.4 kg 70.3 kg    Examination:  Awake Alert, Oriented X 3, No new F.N deficits, Normal affect Symmetrical Chest wall movement, Good air movement bilaterally, CTAB RRR,No Gallops,Rubs or new Murmurs, No Parasternal Heave +ve B.Sounds, Abd Soft, No tenderness, No rebound - guarding or rigidity. Patient with purpuric erythema on his bilateral hands, nonblanching, and maculopapular rash in extremities, does not involve the trunk or the face.    Data Reviewed: I have personally reviewed following labs and imaging studies  CBC: Recent Labs  Lab 12/01/21 1036 12/02/21 0057  WBC 9.0 9.4  NEUTROABS 5.7  --   HGB 10.3* 9.2*  HCT 33.8* 29.1*  MCV 78.4* 76.6*  PLT 602* 443*    Basic Metabolic Panel: Recent Labs  Lab 12/01/21 1036 12/02/21 0057  NA 133* 135  K 4.4 4.2  CL 95* 98  CO2 30 29  GLUCOSE  129* 105*  BUN 7 11  CREATININE 0.92 1.03  CALCIUM 9.1 8.7*    GFR: Estimated Creatinine Clearance: 101.4 mL/min (by C-G formula based on SCr of 1.03 mg/dL).  Liver Function Tests: Recent Labs  Lab 12/01/21 2014  AST 13*  ALT 10  ALKPHOS 96  BILITOT 0.5  PROT 7.5  ALBUMIN 2.3*    CBG: No results for input(s): GLUCAP in the last 168 hours.   Recent Results (from the past 240 hour(s))  Blood culture (routine x 2)     Status: None   Collection Time: 11/24/21  8:26 PM   Specimen: BLOOD LEFT ARM  Result Value Ref Range Status   Specimen Description BLOOD  LEFT ARM  Final   Special Requests   Final    BOTTLES DRAWN AEROBIC AND ANAEROBIC Blood Culture adequate volume   Culture   Final    NO GROWTH 5 DAYS Performed at [facility], [address] [place], [place] [address]    Report Status 11/29/2021 FINAL  Final  Resp Panel by RT-PCR (Flu A&B, Covid) Nasopharyngeal Swab     Status: None   Collection Time: 12/01/21  3:02 PM   Specimen: Nasopharyngeal Swab; Nasopharyngeal(NP) swabs in vial transport medium  Result Value Ref Range Status   SARS Coronavirus 2 by RT PCR NEGATIVE NEGATIVE Final    Comment: (NOTE) SARS-CoV-2 target nucleic acids are NOT DETECTED.  The SARS-CoV-2 RNA is generally detectable in upper respiratory specimens during the acute phase of infection. The lowest concentration of SARS-CoV-2 viral copies this assay can detect is 138 copies/mL. A negative result does not preclude SARS-Cov-2 infection and should not be used as the sole basis for treatment or other patient management decisions. A negative result may occur with  improper specimen collection/handling, submission of specimen other than nasopharyngeal swab, presence of viral mutation(s) within the areas targeted by this assay, and inadequate number of viral copies(<138 copies/mL). A negative result must be combined with clinical observations, patient history, and epidemiological information. The expected result is Negative.  Fact Sheet for Patients:  BloggerCourse.com  Fact Sheet for Healthcare Providers:  SeriousBroker.it  This test is no t yet approved or cleared by the [place] FDA and  has been authorized for detection and/or diagnosis of SARS-CoV-2 by FDA under an Emergency Use Authorization (EUA). This EUA will remain  in effect (meaning this test can be used) for the duration of the COVID-19 declaration under Section 564(b)(1) of the Act, 21 U.S.C.section 360bbb-3(b)(1), unless the  authorization is terminated  or revoked sooner.       Influenza A by PCR NEGATIVE NEGATIVE Final   Influenza B by PCR NEGATIVE NEGATIVE Final    Comment: (NOTE) The Xpert Xpress SARS-CoV-2/FLU/RSV plus assay is intended as an aid in the diagnosis of influenza from Nasopharyngeal swab specimens and should not be used as a sole basis for treatment. Nasal washings and aspirates are unacceptable for Xpert Xpress SARS-CoV-2/FLU/RSV testing.  Fact Sheet for Patients: BloggerCourse.com  Fact Sheet for Healthcare Providers: SeriousBroker.it  This test is not yet approved or cleared by the [place] FDA and has been authorized for detection and/or diagnosis of SARS-CoV-2 by FDA under an Emergency Use Authorization (EUA). This EUA will remain in effect (meaning this test can be used) for the duration of the COVID-19 declaration under Section 564(b)(1) of the Act, 21 U.S.C. section 360bbb-3(b)(1), unless the authorization is terminated or revoked.  Performed at [facility], [address] [place], [place] [address]          Radiology Studies: DG Hand 2 View Right  Result Date: 12/01/2021 CLINICAL DATA:  Skin infection.  Swelling of hand.  Pain. EXAM: RIGHT HAND - 2 VIEW COMPARISON:  None. FINDINGS: There is no evidence of fracture or dislocation. No erosion, periosteal reaction, or bone destruction. There is no evidence of arthropathy or other focal bone abnormality. Dorsal soft tissue edema. No soft tissue air. No radiopaque foreign body. IMPRESSION: Dorsal soft tissue edema. No radiographic findings of osteomyelitis. Electronically Signed   By: Narda Rutherford M.D.   On: 12/01/2021 18:48        Scheduled Meds:  clobetasol ointment   Topical BID   enoxaparin (LOVENOX) injection  40 mg Subcutaneous Q24H   methadone  100 mg Oral Daily   methylPREDNISolone (SOLU-MEDROL) injection  40 mg Intravenous Daily   sodium  chloride flush  3 mL Intravenous Q12H   Continuous Infusions:   LOS: 0 days       Huey Bienenstock, MD [facility]   To contact the attending provider between 7A-7P or the covering provider during after hours 7P-7A, please log into the web site [email or web address] and access using universal [place] password for that web site. If you do not have the password, please call the hospital operator.  12/02/2021, 2:35 PM

## 2021-12-02 NOTE — Progress Notes (Signed)
HOSPITAL MEDICINE OVERNIGHT EVENT NOTE   ? ?Notified by nursing that MRSA PCR screen is positive suggesting colonization. ? ?Chart reviewed, patient has a known history of MRSA bacteremia.  Patient initially presented with bilateral hand erythema and discomfort although after a thorough evaluation by the day provider as well as infectious disease it is felt that this is likely noninfectious. ? ?Since patient does not have an active suspected infection secondary to MRSA this positivity will be managed via protocol which at this point means no isolation. ? ?Marinda Elk  MD ?[facility]  ? ? ? ? ? ? ? ? ? ? ?

## 2021-12-02 NOTE — Plan of Care (Signed)
Duane Price is a 36 y.o. male patient. ?1. Swelling of hand   ?2. Pain of hand   ? ?Past Medical History:  ?Diagnosis Date  ? IV drug user   ? MRSA bacteremia   ? Smoker   ? ?Current Facility-Administered Medications  ?Medication Dose Route Frequency Provider Last Rate Last Admin  ? acetaminophen (TYLENOL) tablet 650 mg  650 mg Oral Q6H PRN Madelyn Flavors A, MD   650 mg at 12/01/21 2241  ? Or  ? acetaminophen (TYLENOL) suppository 650 mg  650 mg Rectal Q6H PRN Madelyn Flavors A, MD      ? albuterol (PROVENTIL) (2.5 MG/3ML) 0.083% nebulizer solution 2.5 mg  2.5 mg Nebulization Q6H PRN Katrinka Blazing, Rondell A, MD      ? cefTRIAXone (ROCEPHIN) 2 g in sodium chloride 0.9 % 100 mL IVPB  2 g Intravenous Q24H Smith, Rondell A, MD      ? clobetasol ointment (TEMOVATE) 0.05 %   Topical BID Clydie Braun, MD   Given at 12/01/21 2241  ? diphenhydrAMINE (BENADRYL) injection 12.5 mg  12.5 mg Intravenous Q6H PRN Madelyn Flavors A, MD      ? enoxaparin (LOVENOX) injection 40 mg  40 mg Subcutaneous Q24H Smith, Rondell A, MD      ? hydrALAZINE (APRESOLINE) injection 5 mg  5 mg Intravenous Q6H PRN Clydie Braun, MD      ? methadone (DOLOPHINE) tablet 100 mg  100 mg Oral Daily Smith, Rondell A, MD      ? ondansetron (ZOFRAN) tablet 4 mg  4 mg Oral Q6H PRN Madelyn Flavors A, MD      ? Or  ? ondansetron (ZOFRAN) injection 4 mg  4 mg Intravenous Q6H PRN Smith, Rondell A, MD      ? sodium chloride flush (NS) 0.9 % injection 3 mL  3 mL Intravenous Q12H Smith, Rondell A, MD   3 mL at 12/01/21 2256  ? vancomycin (VANCOREADY) IVPB 1250 mg/250 mL  1,250 mg Intravenous Q12H Doristine Counter, RPH      ? ?No Known Allergies ?Principal Problem: ?  Rash ?Active Problems: ?  IV drug abuse (HCC) ?  Homeless ?  Thrombocytosis ?  Hyponatremia ?  Elevated AST (SGOT) ?  Cellulitis of hand ? ?Blood pressure 126/64, pulse 69, temperature 98.2 ?F (36.8 ?C), temperature source Oral, resp. rate 15, height 6' (1.829 m), weight 70.3 kg, SpO2 98  %. ? ?Subjective ?Objective ?Assessment & Plan ? ?Vaughan Sine ?12/02/2021 ? ? ?

## 2021-12-02 NOTE — TOC Initial Note (Signed)
Transition of Care (TOC) - Initial/Assessment Note  ? ? ?Patient Details  ?Name: Duane Price ?MRN: [id number] ?Date of Birth: 1985/10/07 ? ?Transition of Care (TOC) CM/SW Contact:    ?[place], LCSW ?Phone Number: ?12/02/2021, 1:12 PM ? ?Clinical Narrative:                 ? ?CSW met with pt in response to homelessness consult. CSW introduced himself and explained purpose. Pt reports he sleeps outside "here and there;" Does not have a tent. He has been experienceing homelessness for 4-5 years. CSW begins to discuss some of the resources in town (shelters, [facility]) but pt reports he is pretty much familiar with the resources. He explains that he isn't interested in the shelters due to negative experiences. Pt states he is a patient at [facility] and is working some with his counselors regarding housing. Pt will need bus pass at discharge.  ?  ?Barriers to Discharge: Continued Medical Work up ? ? ?Patient Goals and CMS Choice ?  ?  ?  ? ?Expected Discharge Plan and Services ?  ?  ?  ?  ?Living arrangements for the past 2 months: Homeless ?                ?  ?  ?  ?  ?  ?  ?  ?  ?  ?  ? ?Prior Living Arrangements/Services ?Living arrangements for the past 2 months: Homeless ?Lives with:: Self ?  ?       ?  ?  ?  ?  ? ?Activities of Daily Living ?Home Assistive Devices/Equipment: None ?ADL Screening (condition at time of admission) ?Patient's cognitive ability adequate to safely complete daily activities?: Yes ?Is the patient deaf or have difficulty hearing?: No ?Does the patient have difficulty seeing, even when wearing glasses/contacts?: No ?Does the patient have difficulty concentrating, remembering, or making decisions?: No ?Patient able to express need for assistance with ADLs?: Yes ?Does the patient have difficulty dressing or bathing?: No ?Independently performs ADLs?: Yes (appropriate for developmental age) ?Does the patient have difficulty walking or climbing stairs?: No ?Weakness of Legs: None ?Weakness of  Arms/Hands: Both ? ?Permission Sought/Granted ?  ?  ?   ?   ?   ?   ? ?Emotional Assessment ?Appearance:: Appears stated age ?Attitude/Demeanor/Rapport: Engaged ?Affect (typically observed): Accepting ?Orientation: : Oriented to Self, Oriented to Place, Oriented to  Time, Oriented to Situation ?  ?  ? ?Admission diagnosis:  Rash [R21] ?Swelling of hand [M79.89] ?Pain of hand [M79.643] ?Patient Active Problem List  ? Diagnosis Date Noted  ? Rash 12/01/2021  ? Thrombocytosis 12/01/2021  ? Hyponatremia 12/01/2021  ? Elevated AST (SGOT) 12/01/2021  ? Cellulitis of hand 12/01/2021  ? Osteomyelitis ([place]) 05/13/2021  ? Discitis, unspecified, thoracolumbar region 05/13/2021  ? History of bacteremia 05/13/2021  ? History of pulmonary embolus (PE) 05/13/2021  ? Homeless 05/13/2021  ? Polysubstance abuse ([place]) 05/13/2021  ? MRSA bacteremia 03/29/2021  ? HCV antibody positive 03/29/2021  ? Endocarditis of tricuspid valve 03/27/2021  ? Septic embolism ([place]) 03/25/2021  ? Normocytic anemia 03/25/2021  ? IV drug abuse ([place]) 03/25/2021  ? ?PCP:  Pcp, No ?Pharmacy:   ?[facility], [place] [address] ?[address] [place] [address] ?Phone: [phone] Fax: [phone] ? ? ? ? ?Social Determinants of Health (SDOH) Interventions ?  ? ?Readmission Risk Interventions ?No flowsheet data found. ? ? ?

## 2021-12-02 NOTE — TOC Transition Note (Signed)
Transition of Care (TOC) - CM/SW Discharge Note ? ? ?Patient Details  ?Name: Duane Price ?MRN: [id number] ?Date of Birth: April 30, 1986 ? ?Transition of Care [facility]) CM/SW Contact:  ?Durenda Guthrie, RN ?Phone Number: ?12/02/2021, 3:07 PM ? ? ?Clinical Narrative:   Case Manager has submitted MATCH for patient for medication assistance. Appointment has been scheduled for patient at [facility] for Friday, January 06, 2022 @ 2:30pm with Dr. Jonah Blue. Appointment has been entered on AVS.   ? ? ? ?  ?Barriers to Discharge: No Barriers Identified ? ? ?Patient Goals and CMS Choice ?  ?  ?  ? ?Discharge Placement ?  ?           ?  ?  ?  ?  ? ?Discharge Plan and Services ?  ?Discharge Planning Services: Follow-up appt scheduled, MATCH Program ?           ?  ?  ?  ?  ?  ?  ?  ?  ?  ?  ? ?Social Determinants of Health (SDOH) Interventions ?  ? ? ?Readmission Risk Interventions ?No flowsheet data found. ? ? ? ? ?

## 2021-12-02 NOTE — Consult Note (Addendum)
?  [facility]  ? ? ? ? ? ?Reason for Consult: rash    ?Referring Physician: Dr. Randol Kern ? ?Principal Problem: ?  Rash ?Active Problems: ?  IV drug abuse (HCC) ?  Homeless ?  Thrombocytosis ?  Hyponatremia ?  Elevated AST (SGOT) ?  Cellulitis of hand ? ? ? clobetasol ointment   Topical BID  ? enoxaparin (LOVENOX) injection  40 mg Subcutaneous Q24H  ? methadone  100 mg Oral Daily  ? [START ON 12/03/2021] predniSONE  20 mg Oral Q breakfast  ? sodium chloride flush  3 mL Intravenous Q12H  ? ? ?Recommendations: ? Stop antibiotics ?Will start prednisone 20 mg for 5 days ?HCV RNA ?UA to check for hematuria ? ?Assessment: ?He has a vasculitic-like rash on his arms, hands and legs that has been persistent and of unclear etiology.  Not c/w RMSF with no headache or fever.  Broad differential including infectious etiology such as hepatitis C but recently negative RNA. May be related to his drug use though he denies.  Will check above labs but overall low suspicion that an etiology will be found.  These can be followed as an outpatient. ? ?Antibiotics: ?Vancomycin + ceftriaxone ? ?HPI: Duane Price is a 36 y.o. male with a history of IVDU, though denies current use, history of TV endocarditis with MRSA, recent osteomyelitis/discitis and homelessness now here with swelling and pain of hands bilateral.  He has noted a rash on his hands, arms and legs with the swelling.  No headache or fever.  RMSF checked and IgG positive in one test and negative on a different test.  He was started on vancomycin and ceftriaxone for possible cellulitis and has been on doxycycline from a recent ED visit for the same concern of cellulitis.  He had a recent MRI of his spine as well and no significant new findings, just expected disc height loss.  He has less swelling now.  Findings similar now as compared to last week.  He has been afebrile here and WBC 9.4.   ? ? ?Review of Systems: ? Constitutional: negative for  fevers and chills ?Gastrointestinal: negative for nausea and diarrhea ?All other systems reviewed and are negative   ? ?Past Medical History:  ?Diagnosis Date  ? IV drug user   ? MRSA bacteremia   ? Smoker   ? ? ?Social History  ? ?Tobacco Use  ? Smoking status: Every Day  ?  Packs/day: 0.50  ?  Types: Cigarettes  ? Smokeless tobacco: Never  ?Vaping Use  ? Vaping Use: Never used  ?Substance Use Topics  ? Alcohol use: Not Currently  ?  Comment: Per patient  ? Drug use: Yes  ?  Types: Cocaine, IV  ?  Comment: Heroin  ? ? ?Family History  ?Family history unknown: Yes  ? ? ?No Known Allergies ? ?Physical Exam: ?Constitutional: in no apparent distress  ?Vitals:  ? 12/02/21 0400 12/02/21 0747  ?BP: 97/62 108/66  ?Pulse: 61 78  ?Resp: 17 15  ?Temp: 97.9 ?F (36.6 ?C) 98.6 ?F (37 ?C)  ?SpO2: 98% 100%  ? ?EYES: anicteric ?Cardiovascular: Cor RRR ?Respiratory: clear; ?Musculoskeletal: no pedal edema noted ?Skin: + confluent purpuric erythema on hands bilateral and non-blanching small circular erythematous patches on arms and legs ? ?Lab Results  ?Component Value Date  ? WBC 9.4 12/02/2021  ? HGB 9.2 (L) 12/02/2021  ? HCT 29.1 (L) 12/02/2021  ? MCV 76.6 (L) 12/02/2021  ? PLT  443 (H) 12/02/2021  ?  ?Lab Results  ?Component Value Date  ? CREATININE 1.03 12/02/2021  ? BUN 11 12/02/2021  ? NA 135 12/02/2021  ? K 4.2 12/02/2021  ? CL 98 12/02/2021  ? CO2 29 12/02/2021  ?  ?Lab Results  ?Component Value Date  ? ALT 10 12/01/2021  ? AST 13 (L) 12/01/2021  ? ALKPHOS 96 12/01/2021  ?  ? ?Microbiology: ?Recent Results (from the past 240 hour(s))  ?Blood culture (routine x 2)     Status: None  ? Collection Time: 11/24/21  8:26 PM  ? Specimen: BLOOD LEFT ARM  ?Result Value Ref Range Status  ? Specimen Description BLOOD LEFT ARM  Final  ? Special Requests   Final  ?  BOTTLES DRAWN AEROBIC AND ANAEROBIC Blood Culture adequate volume  ? Culture   Final  ?  NO GROWTH 5 DAYS ?Performed at [facility], [address] [place], [place]  [address] ?  ? Report Status 11/29/2021 FINAL  Final  ?Resp Panel by RT-PCR (Flu A&B, Covid) Nasopharyngeal Swab     Status: None  ? Collection Time: 12/01/21  3:02 PM  ? Specimen: Nasopharyngeal Swab; Nasopharyngeal(NP) swabs in vial transport medium  ?Result Value Ref Range Status  ? SARS Coronavirus 2 by RT PCR NEGATIVE NEGATIVE Final  ?  Comment: (NOTE) ?SARS-CoV-2 target nucleic acids are NOT DETECTED. ? ?The SARS-CoV-2 RNA is generally detectable in upper respiratory ?specimens during the acute phase of infection. The lowest ?concentration of SARS-CoV-2 viral copies this assay can detect is ?138 copies/mL. A negative result does not preclude SARS-Cov-2 ?infection and should not be used as the sole basis for treatment or ?other patient management decisions. A negative result may occur with  ?improper specimen collection/handling, submission of specimen other ?than nasopharyngeal swab, presence of viral mutation(s) within the ?areas targeted by this assay, and inadequate number of viral ?copies(<138 copies/mL). A negative result must be combined with ?clinical observations, patient history, and epidemiological ?information. The expected result is Negative. ? ?Fact Sheet for Patients:  ?BloggerCourse.comhttps://www.fda.gov/media/152166/download ? ?Fact Sheet for Healthcare Providers:  ?SeriousBroker.ithttps://www.fda.gov/media/152162/download ? ?This test is no t yet approved or cleared by the [place] FDA and  ?has been authorized for detection and/or diagnosis of SARS-CoV-2 by ?FDA under an Emergency Use Authorization (EUA). This EUA will remain  ?in effect (meaning this test can be used) for the duration of the ?COVID-19 declaration under Section 564(b)(1) of the Act, 21 ?U.S.C.section 360bbb-3(b)(1), unless the authorization is terminated  ?or revoked sooner.  ? ? ?  ? Influenza A by PCR NEGATIVE NEGATIVE Final  ? Influenza B by PCR NEGATIVE NEGATIVE Final  ?  Comment: (NOTE) ?The Xpert Xpress SARS-CoV-2/FLU/RSV plus assay is intended as an  aid ?in the diagnosis of influenza from Nasopharyngeal swab specimens and ?should not be used as a sole basis for treatment. Nasal washings and ?aspirates are unacceptable for Xpert Xpress SARS-CoV-2/FLU/RSV ?testing. ? ?Fact Sheet for Patients: ?BloggerCourse.comhttps://www.fda.gov/media/152166/download ? ?Fact Sheet for Healthcare Providers: ?SeriousBroker.ithttps://www.fda.gov/media/152162/download ? ?This test is not yet approved or cleared by the [place] FDA and ?has been authorized for detection and/or diagnosis of SARS-CoV-2 by ?FDA under an Emergency Use Authorization (EUA). This EUA will remain ?in effect (meaning this test can be used) for the duration of the ?COVID-19 declaration under Section 564(b)(1) of the Act, 21 U.S.C. ?section 360bbb-3(b)(1), unless the authorization is terminated or ?revoked. ? ?Performed at [facility], [address] [place], [place] ?[id number] ?  ? ? ?  Gardiner Barefoot, MD ?[facility] ?[facility] ?[email or web address] ?12/02/2021, 10:39 AM  ?

## 2021-12-03 DIAGNOSIS — M79641 Pain in right hand: Secondary | ICD-10-CM

## 2021-12-03 DIAGNOSIS — M79642 Pain in left hand: Secondary | ICD-10-CM

## 2021-12-03 LAB — HCV RNA QUANT: HCV Quantitative: NOT DETECTED IU/mL (ref >50)

## 2021-12-03 NOTE — Discharge Summary (Signed)
Physician Discharge Summary  Tamari Busic ZOX:[id number] DOB: 1986-06-08 DOA: 12/01/2021  PCP: Pcp, No  Admit date: 12/01/2021 Discharge date: 12/03/2021  Admitted From: Home Disposition:  Home   Recommendations for Outpatient Follow-up:  Follow up with [facility] Ambulatory referral has been made to rheumatology to evaluate for possible vasculitis Please follow up on the following pending results: Final results of HCV RNA quantitative  Home Health:NO   Discharge Condition:Stable CODE STATUS:FULL Diet recommendation: Regular   Brief/Interim Summary:   Peregrine Nolt is a 36 y.o. male with medical history significant of heroin abuse, MRSA bacteremia, tricuspid valve endocarditis with septic emboli,  osteomyelitis/discitis, and homelessness presents with complaints of pain and swelling of his hands.  Patient reports symptoms started 2-3 weeks ago with itching on the bottom of his feet and palms of his hands, and worsening diffuse maculopapular rash on extremities.     Rash/bilateral hand erythema -Low clinical suspicious for infectious process, ID input greatly appreciated, there is more concern for actual inflammatory process like vasculitis.  So his broad-spectrum IV antibiotic has been discontinued, and he was started on low-dose steroids during hospital stay with significant improvement in his rash, UA has been obtained with no evidence of hematuria. -His rash and erythema felt secondary to inflammatory more than infectious process he will be discharged on low-dose prednisone with recommendation to follow-up with rheumatology as an outpatient. -Ig M RMSF is nonreactive   Hyponatremia Improved with IV fluids   Thrombocytosis Acute on chronic.  Platelet count 602 on admission.   Homeless -TOC   IV drug abuse [facility]) Patient reports that he has not used IV drugs in months.  He is currently on methadone 100 mg daily. 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      Discharge Diagnoses:  Principal Problem:   Rash Active Problems:   Cellulitis of hand   IV drug abuse (HCC)   Homeless   Thrombocytosis   Hyponatremia   Elevated AST (SGOT)    Discharge Instructions  Discharge Instructions     Ambulatory referral to Rheumatology   Complete by: As directed    Diet - low sodium heart healthy   Complete by: As directed    Discharge instructions   Complete by: As directed    Follow with Primary MD  Get CBC, CMP,     Disposition Home    Diet: Regular    On your next visit with your primary care physician please Get Medicines reviewed and adjusted.   Please request your Prim.MD to go over all Hospital Tests and Procedure/Radiological results at the follow up, please get all Hospital records sent to your Prim MD by signing hospital release before you go home.   If you experience worsening of your admission symptoms, develop shortness of breath, life threatening emergency, suicidal or homicidal thoughts you must seek medical attention immediately by calling 911 or calling your MD immediately  if symptoms less severe.  You Must read complete instructions/literature along with all the possible adverse reactions/side effects for all the Medicines you take and that have been prescribed to you. Take any new Medicines after you have completely understood and accpet all the possible adverse reactions/side effects.   Do not drive, operating heavy machinery, perform activities at heights, swimming or participation in water activities or provide baby sitting services if your were admitted for  syncope or siezures until you have seen by Primary MD or a Neurologist and advised to do so again.  Do not drive when taking Pain medications.    Do not take more than prescribed Pain, Sleep and Anxiety  Medications  Special Instructions: If you have smoked or chewed Tobacco  in the last 2 yrs please stop smoking, stop any regular Alcohol  and or any Recreational drug use.  Wear Seat belts while driving.   Please note  You were cared for by a hospitalist during your hospital stay. If you have any questions about your discharge medications or the care you received while you were in the hospital after you are discharged, you can call the unit and asked to speak with the hospitalist on call if the hospitalist that took care of you is not available. Once you are discharged, your primary care physician will handle any further medical issues. Please note that NO REFILLS for any discharge medications will be authorized once you are discharged, as it is imperative that you return to your primary care physician (or establish a relationship with a primary care physician if you do not have one) for your aftercare needs so that they can reassess your need for medications and monitor your lab values.   Increase activity slowly   Complete by: As directed       Allergies as of 12/03/2021   No Known Allergies      Medication List     STOP taking these medications    doxycycline 100 MG capsule Commonly known as: VIBRAMYCIN   ibuprofen 600 MG tablet Commonly known as: ADVIL       TAKE these medications    acetaminophen 500 MG tablet Commonly known as: TYLENOL Take 1 tablet (500 mg total) by mouth every 6 (six) hours as needed.   Eliquis 5 MG Tabs tablet Generic drug: apixaban Take 1 tablet (5 mg total) by mouth 2 (two) times daily.   hydrOXYzine 25 MG tablet Commonly known as: ATARAX Take 1 tablet (25 mg total) by mouth 3 (three) times daily as needed for anxiety.   METHADONE HCL PO Take 100 mg by mouth every morning.   pantoprazole 40 MG tablet Commonly known as: PROTONIX Take 1 tablet (40 mg total) by mouth daily.   permethrin 5 % cream Commonly known as: ELIMITE Apply 5%  cream to entire body from the neck down, then wash off after 8 to 14 hours (guideline dosage)   predniSONE 10 MG tablet Commonly known as: DELTASONE Take 2 tablets (20 mg total) by mouth daily for 5 days.        Follow-up Information     [facility]. Go to.   Why: You are scheduled to see DrLaural Benes on January 06, 2022 at 2:30pm, Hospital follow-up/ establish PCP Contact information: [address] [place] [address] [phone]               No Known Allergies  Consultations: ID   Procedures/Studies: MR THORACIC SPINE WO CONTRAST  Result Date: 11/25/2021 CLINICAL DATA:  Previous episode of discitis-osteomyelitis. Back pain EXAM: MRI THORACIC AND LUMBAR SPINE WITHOUT CONTRAST TECHNIQUE: Multiplanar and multiecho pulse sequences of the thoracic and lumbar spine were obtained without intravenous contrast. COMPARISON:  06/13/2021 FINDINGS: MRI THORACIC SPINE FINDINGS Alignment:  Physiologic. Vertebrae: Progression of height loss at T12. There is persistent abnormal signal within the T12-L1 disc space. There is diffuse edema within the T12 and  L1 bone marrow. Cord:  Normal signal and morphology. Paraspinal and other soft tissues: Negative Disc levels: T8-9: Small right subarticular disc protrusion without stenosis. Otherwise, no spinal canal or neural foraminal stenosis. MRI LUMBAR SPINE FINDINGS Segmentation:  Standard. Alignment:  Physiologic. Vertebrae: Progression of height loss at T12 and L1. Posterior bulging of the disc space is also increased. Conus medullaris and cauda equina: Conus extends to the L1-2 level. Conus and cauda equina appear normal. Paraspinal and other soft tissues: Negative Disc levels: At T12-L1, there is moderate spinal canal stenosis and severe left neural foraminal stenosis. There is no disc herniation, spinal canal stenosis or neural impingement at any of the other lumbar levels. IMPRESSION: 1.  Progression of height loss at T12-L1 with persistent abnormal signal within the disc space consistent with sequelae of discitis-osteomyelitis. 2. Progression to moderate spinal canal stenosis and severe left neural foraminal stenosis at the T12-L1 level. 3. No thoracic or lumbar spinal canal stenosis otherwise. Electronically Signed   By: Deatra Robinson M.D.   On: 11/25/2021 03:34   MR LUMBAR SPINE WO CONTRAST  Result Date: 11/25/2021 CLINICAL DATA:  Previous episode of discitis-osteomyelitis. Back pain EXAM: MRI THORACIC AND LUMBAR SPINE WITHOUT CONTRAST TECHNIQUE: Multiplanar and multiecho pulse sequences of the thoracic and lumbar spine were obtained without intravenous contrast. COMPARISON:  06/13/2021 FINDINGS: MRI THORACIC SPINE FINDINGS Alignment:  Physiologic. Vertebrae: Progression of height loss at T12. There is persistent abnormal signal within the T12-L1 disc space. There is diffuse edema within the T12 and L1 bone marrow. Cord:  Normal signal and morphology. Paraspinal and other soft tissues: Negative Disc levels: T8-9: Small right subarticular disc protrusion without stenosis. Otherwise, no spinal canal or neural foraminal stenosis. MRI LUMBAR SPINE FINDINGS Segmentation:  Standard. Alignment:  Physiologic. Vertebrae: Progression of height loss at T12 and L1. Posterior bulging of the disc space is also increased. Conus medullaris and cauda equina: Conus extends to the L1-2 level. Conus and cauda equina appear normal. Paraspinal and other soft tissues: Negative Disc levels: At T12-L1, there is moderate spinal canal stenosis and severe left neural foraminal stenosis. There is no disc herniation, spinal canal stenosis or neural impingement at any of the other lumbar levels. IMPRESSION: 1. Progression of height loss at T12-L1 with persistent abnormal signal within the disc space consistent with sequelae of discitis-osteomyelitis. 2. Progression to moderate spinal canal stenosis and severe left neural  foraminal stenosis at the T12-L1 level. 3. No thoracic or lumbar spinal canal stenosis otherwise. Electronically Signed   By: Deatra Robinson M.D.   On: 11/25/2021 03:34   DG Hand 2 View Right  Result Date: 12/01/2021 CLINICAL DATA:  Skin infection.  Swelling of hand.  Pain. EXAM: RIGHT HAND - 2 VIEW COMPARISON:  None. FINDINGS: There is no evidence of fracture or dislocation. No erosion, periosteal reaction, or bone destruction. There is no evidence of arthropathy or other focal bone abnormality. Dorsal soft tissue edema. No soft tissue air. No radiopaque foreign body. IMPRESSION: Dorsal soft tissue edema. No radiographic findings of osteomyelitis. Electronically Signed   By: Narda Rutherford M.D.   On: 12/01/2021 18:48      Subjective: Patient reports he is feeling better, rash is  less itchy today.  Discharge Exam: Vitals:   12/03/21 0317 12/03/21 0921  BP: 106/60 (!) 106/54  Pulse: 77 73  Resp: 18 14  Temp: 97.9 F (36.6 C) 97.8 F (36.6 C)  SpO2: 98% 94%   Vitals:   12/02/21 1557 12/02/21 2030 12/03/21  0317 12/03/21 0921  BP: (!) 108/55 113/62 106/60 (!) 106/54  Pulse: 65 72 77 73  Resp: 18 17 18 14   Temp: 98.3 F (36.8 C) 98.1 F (36.7 C) 97.9 F (36.6 C) 97.8 F (36.6 C)  TempSrc: Oral Oral Oral Oral  SpO2:  98% 98% 94%  Weight:      Height:        General: Pt is alert, awake, not in acute distress Cardiovascular: RRR, S1/S2 +, no rubs, no gallops Respiratory: CTA bilaterally, no wheezing, no rhonchi Abdominal: Soft, NT, ND, bowel sounds + Extremities: no edema, no cyanosis, left hand erythema significantly improved almost resolved, and right hand erythema has significantly improved as well, patient with maculopapular rash on extremities with no significant change, this rash does not involve trunk or face.    The results of significant diagnostics from this hospitalization (including imaging, microbiology, ancillary and laboratory) are listed below for reference.      Microbiology: Recent Results (from the past 240 hour(s))  Blood culture (routine x 2)     Status: None   Collection Time: 11/24/21  8:26 PM   Specimen: BLOOD LEFT ARM  Result Value Ref Range Status   Specimen Description BLOOD LEFT ARM  Final   Special Requests   Final    BOTTLES DRAWN AEROBIC AND ANAEROBIC Blood Culture adequate volume   Culture   Final    NO GROWTH 5 DAYS Performed at [facility], [address] [place], [place] [address]    Report Status 11/29/2021 FINAL  Final  Resp Panel by RT-PCR (Flu A&B, Covid) Nasopharyngeal Swab     Status: None   Collection Time: 12/01/21  3:02 PM   Specimen: Nasopharyngeal Swab; Nasopharyngeal(NP) swabs in vial transport medium  Result Value Ref Range Status   SARS Coronavirus 2 by RT PCR NEGATIVE NEGATIVE Final    Comment: (NOTE) SARS-CoV-2 target nucleic acids are NOT DETECTED.  The SARS-CoV-2 RNA is generally detectable in upper respiratory specimens during the acute phase of infection. The lowest concentration of SARS-CoV-2 viral copies this assay can detect is 138 copies/mL. A negative result does not preclude SARS-Cov-2 infection and should not be used as the sole basis for treatment or other patient management decisions. A negative result may occur with  improper specimen collection/handling, submission of specimen other than nasopharyngeal swab, presence of viral mutation(s) within the areas targeted by this assay, and inadequate number of viral copies(<138 copies/mL). A negative result must be combined with clinical observations, patient history, and epidemiological information. The expected result is Negative.  Fact Sheet for Patients:  BloggerCourse.comhttps://www.fda.gov/media/152166/download  Fact Sheet for Healthcare Providers:  SeriousBroker.ithttps://www.fda.gov/media/152162/download  This test is no t yet approved or cleared by the [place] FDA and  has been authorized for detection and/or diagnosis of SARS-CoV-2 by FDA under  an Emergency Use Authorization (EUA). This EUA will remain  in effect (meaning this test can be used) for the duration of the COVID-19 declaration under Section 564(b)(1) of the Act, 21 U.S.C.section 360bbb-3(b)(1), unless the authorization is terminated  or revoked sooner.       Influenza A by PCR NEGATIVE NEGATIVE Final   Influenza B by PCR NEGATIVE NEGATIVE Final    Comment: (NOTE) The Xpert Xpress SARS-CoV-2/FLU/RSV plus assay is intended as an aid in the diagnosis of influenza from Nasopharyngeal swab specimens and should not be used as a sole basis for treatment. Nasal washings and aspirates are unacceptable for Xpert Xpress SARS-CoV-2/FLU/RSV testing.  Fact  Sheet for Patients: BloggerCourse.com  Fact Sheet for Healthcare Providers: SeriousBroker.it  This test is not yet approved or cleared by the [place] FDA and has been authorized for detection and/or diagnosis of SARS-CoV-2 by FDA under an Emergency Use Authorization (EUA). This EUA will remain in effect (meaning this test can be used) for the duration of the COVID-19 declaration under Section 564(b)(1) of the Act, 21 U.S.C. section 360bbb-3(b)(1), unless the authorization is terminated or revoked.  Performed at [facility], [address], [place], [place] [address]   MRSA Next Gen by PCR, Nasal     Status: Abnormal   Collection Time: 12/02/21  9:31 AM   Specimen: Urine, Clean Catch; Nasal Swab  Result Value Ref Range Status   MRSA by PCR Next Gen DETECTED (A) NOT DETECTED Final    Comment: RESULT CALLED TO, READ BACK BY AND VERIFIED WITH: RN Samuel Jester [phone]  FH (NOTE) The GeneXpert MRSA Assay (FDA approved for NASAL specimens only), is one component of a comprehensive MRSA colonization surveillance program. It is not intended to diagnose MRSA infection nor to guide or monitor treatment for MRSA infections. Test performance is not FDA approved  in patients less than 31 years old. Performed at [facility], [address] [place], [place] [address]      Labs: BNP (last 3 results) No results for input(s): BNP in the last 8760 hours. Basic Metabolic Panel: Recent Labs  Lab 12/01/21 1036 12/02/21 0057  NA 133* 135  K 4.4 4.2  CL 95* 98  CO2 30 29  GLUCOSE 129* 105*  BUN 7 11  CREATININE 0.92 1.03  CALCIUM 9.1 8.7*   Liver Function Tests: Recent Labs  Lab 12/01/21 2014  AST 13*  ALT 10  ALKPHOS 96  BILITOT 0.5  PROT 7.5  ALBUMIN 2.3*   No results for input(s): LIPASE, AMYLASE in the last 168 hours. No results for input(s): AMMONIA in the last 168 hours. CBC: Recent Labs  Lab 12/01/21 1036 12/02/21 0057  WBC 9.0 9.4  NEUTROABS 5.7  --   HGB 10.3* 9.2*  HCT 33.8* 29.1*  MCV 78.4* 76.6*  PLT 602* 443*   Cardiac Enzymes: No results for input(s): CKTOTAL, CKMB, CKMBINDEX, TROPONINI in the last 168 hours. BNP: Invalid input(s): POCBNP CBG: No results for input(s): GLUCAP in the last 168 hours. D-Dimer No results for input(s): DDIMER in the last 72 hours. Hgb A1c No results for input(s): HGBA1C in the last 72 hours. Lipid Profile No results for input(s): CHOL, HDL, LDLCALC, TRIG, CHOLHDL, LDLDIRECT in the last 72 hours. Thyroid function studies No results for input(s): TSH, T4TOTAL, T3FREE, THYROIDAB in the last 72 hours.  Invalid input(s): FREET3 Anemia work up No results for input(s): VITAMINB12, FOLATE, FERRITIN, TIBC, IRON, RETICCTPCT in the last 72 hours. Urinalysis    Component Value Date/Time   COLORURINE STRAW (A) 12/02/2021 1103   APPEARANCEUR CLEAR 12/02/2021 1103   LABSPEC 1.011 12/02/2021 1103   PHURINE 7.0 12/02/2021 1103   GLUCOSEU NEGATIVE 12/02/2021 1103   HGBUR SMALL (A) 12/02/2021 1103   BILIRUBINUR NEGATIVE 12/02/2021 1103   KETONESUR NEGATIVE 12/02/2021 1103   PROTEINUR NEGATIVE 12/02/2021 1103   UROBILINOGEN 1.0 02/09/2010 2121   NITRITE NEGATIVE 12/02/2021 1103    LEUKOCYTESUR NEGATIVE 12/02/2021 1103   Sepsis Labs Invalid input(s): PROCALCITONIN,  WBC,  LACTICIDVEN Microbiology Recent Results (from the past 240 hour(s))  Blood culture (routine x 2)     Status: None  Collection Time: 11/24/21  8:26 PM   Specimen: BLOOD LEFT ARM  Result Value Ref Range Status   Specimen Description BLOOD LEFT ARM  Final   Special Requests   Final    BOTTLES DRAWN AEROBIC AND ANAEROBIC Blood Culture adequate volume   Culture   Final    NO GROWTH 5 DAYS Performed at [facility], [address], [place], [place] [address]    Report Status 11/29/2021 FINAL  Final  Resp Panel by RT-PCR (Flu A&B, Covid) Nasopharyngeal Swab     Status: None   Collection Time: 12/01/21  3:02 PM   Specimen: Nasopharyngeal Swab; Nasopharyngeal(NP) swabs in vial transport medium  Result Value Ref Range Status   SARS Coronavirus 2 by RT PCR NEGATIVE NEGATIVE Final    Comment: (NOTE) SARS-CoV-2 target nucleic acids are NOT DETECTED.  The SARS-CoV-2 RNA is generally detectable in upper respiratory specimens during the acute phase of infection. The lowest concentration of SARS-CoV-2 viral copies this assay can detect is 138 copies/mL. A negative result does not preclude SARS-Cov-2 infection and should not be used as the sole basis for treatment or other patient management decisions. A negative result may occur with  improper specimen collection/handling, submission of specimen other than nasopharyngeal swab, presence of viral mutation(s) within the areas targeted by this assay, and inadequate number of viral copies(<138 copies/mL). A negative result must be combined with clinical observations, patient history, and epidemiological information. The expected result is Negative.  Fact Sheet for Patients:  BloggerCourse.com  Fact Sheet for Healthcare Providers:  SeriousBroker.it  This test is no t yet approved or cleared by  the [place] FDA and  has been authorized for detection and/or diagnosis of SARS-CoV-2 by FDA under an Emergency Use Authorization (EUA). This EUA will remain  in effect (meaning this test can be used) for the duration of the COVID-19 declaration under Section 564(b)(1) of the Act, 21 U.S.C.section 360bbb-3(b)(1), unless the authorization is terminated  or revoked sooner.       Influenza A by PCR NEGATIVE NEGATIVE Final   Influenza B by PCR NEGATIVE NEGATIVE Final    Comment: (NOTE) The Xpert Xpress SARS-CoV-2/FLU/RSV plus assay is intended as an aid in the diagnosis of influenza from Nasopharyngeal swab specimens and should not be used as a sole basis for treatment. Nasal washings and aspirates are unacceptable for Xpert Xpress SARS-CoV-2/FLU/RSV testing.  Fact Sheet for Patients: BloggerCourse.com  Fact Sheet for Healthcare Providers: SeriousBroker.it  This test is not yet approved or cleared by the [place] FDA and has been authorized for detection and/or diagnosis of SARS-CoV-2 by FDA under an Emergency Use Authorization (EUA). This EUA will remain in effect (meaning this test can be used) for the duration of the COVID-19 declaration under Section 564(b)(1) of the Act, 21 U.S.C. section 360bbb-3(b)(1), unless the authorization is terminated or revoked.  Performed at [facility], [address] [place], [place] [address]   MRSA Next Gen by PCR, Nasal     Status: Abnormal   Collection Time: 12/02/21  9:31 AM   Specimen: Urine, Clean Catch; Nasal Swab  Result Value Ref Range Status   MRSA by PCR Next Gen DETECTED (A) NOT DETECTED Final    Comment: RESULT CALLED TO, READ BACK BY AND VERIFIED WITH: RN Samuel Jester [phone]  FH (NOTE) The GeneXpert MRSA Assay (FDA approved for NASAL specimens only), is one component of a comprehensive MRSA colonization surveillance program. It is not intended to diagnose  MRSA infection nor to guide or monitor treatment for MRSA infections. Test performance is not FDA approved in patients less than 27 years old. Performed at [facility], [address], [place], [place] [address]      Time coordinating discharge:  30 minutes  SIGNED:   Huey Bienenstock, MD  [facility] 12/03/2021, 11:51 AM Pager   If 7PM-7AM, please contact night-coverage [email or web address] Password [email or web address]

## 2021-12-03 NOTE — Plan of Care (Signed)
?  Problem: Education: ?Goal: Knowledge of General Education information will improve ?Description: Including pain rating scale, medication(s)/side effects and non-pharmacologic comfort measures ?Outcome: Adequate for Discharge ?  ?Problem: Health Behavior/Discharge Planning: ?Goal: Ability to manage health-related needs will improve ?Outcome: Adequate for Discharge ?  ?Problem: Clinical Measurements: ?Goal: Ability to maintain clinical measurements within normal limits will improve ?Outcome: Adequate for Discharge ?Goal: Will remain free from infection ?Outcome: Adequate for Discharge ?Goal: Diagnostic test results will improve ?Outcome: Adequate for Discharge ?Goal: Respiratory complications will improve ?Outcome: Adequate for Discharge ?Goal: Cardiovascular complication will be avoided ?Outcome: Adequate for Discharge ?  ?Problem: Coping: ?Goal: Level of anxiety will decrease ?Outcome: Adequate for Discharge ?  ?Problem: Nutrition: ?Goal: Adequate nutrition will be maintained ?Outcome: Adequate for Discharge ?  ?Problem: Activity: ?Goal: Risk for activity intolerance will decrease ?Outcome: Adequate for Discharge ?  ?Problem: Elimination: ?Goal: Will not experience complications related to bowel motility ?Outcome: Adequate for Discharge ?Goal: Will not experience complications related to urinary retention ?Outcome: Adequate for Discharge ?  ?Problem: Skin Integrity: ?Goal: Risk for impaired skin integrity will decrease ?Outcome: Adequate for Discharge ?  ?Problem: Safety: ?Goal: Ability to remain free from injury will improve ?Outcome: Adequate for Discharge ?  ?

## 2021-12-03 NOTE — Plan of Care (Signed)

## 2021-12-03 NOTE — Discharge Instructions (Signed)
Follow with Primary MD  ?Get CBC, CMP,  ? ? ? ?Disposition Home  ? ? ?Diet: Regular  ? ? ?On your next visit with your primary care physician please Get Medicines reviewed and adjusted. ? ? ?Please request your Prim.MD to go over all Hospital Tests and Procedure/Radiological results at the follow up, please get all Hospital records sent to your Prim MD by signing hospital release before you go home. ? ? ?If you experience worsening of your admission symptoms, develop shortness of breath, life threatening emergency, suicidal or homicidal thoughts you must seek medical attention immediately by calling 911 or calling your MD immediately  if symptoms less severe. ? ?You Must read complete instructions/literature along with all the possible adverse reactions/side effects for all the Medicines you take and that have been prescribed to you. Take any new Medicines after you have completely understood and accpet all the possible adverse reactions/side effects.  ? ?Do not drive, operating heavy machinery, perform activities at heights, swimming or participation in water activities or provide baby sitting services if your were admitted for syncope or siezures until you have seen by Primary MD or a Neurologist and advised to do so again. ? ?Do not drive when taking Pain medications.  ? ? ?Do not take more than prescribed Pain, Sleep and Anxiety Medications ? ?Special Instructions: If you have smoked or chewed Tobacco  in the last 2 yrs please stop smoking, stop any regular Alcohol  and or any Recreational drug use. ? ?Wear Seat belts while driving. ? ? ?Please note ? ?You were cared for by a hospitalist during your hospital stay. If you have any questions about your discharge medications or the care you received while you were in the hospital after you are discharged, you can call the unit and asked to speak with the hospitalist on call if the hospitalist that took care of you is not available. Once you are discharged, your  primary care physician will handle any further medical issues. Please note that NO REFILLS for any discharge medications will be authorized once you are discharged, as it is imperative that you return to your primary care physician (or establish a relationship with a primary care physician if you do not have one) for your aftercare needs so that they can reassess your need for medications and monitor your lab values.  ?

## 2021-12-03 NOTE — TOC Progression Note (Signed)
Transition of Care (TOC) - Progression Note  ? ? ?Patient Details  ?Name: Duane Price ?MRN: [id number] ?Date of Birth: 08-Sep-1986 ? ?Transition of Care (TOC) CM/SW Contact  ?Carolin Sicks, LCSWA ?Phone Number: ?12/03/2021, 12:14 PM ? ?Clinical Narrative:    ?CSW provided pt with 2 bus passes for transportation needs. ? ? ?Expected Discharge Plan: Home/Self Care ?Barriers to Discharge: No Barriers Identified ? ?Expected Discharge Plan and Services ?Expected Discharge Plan: Home/Self Care ?  ?Discharge Planning Services: Follow-up appt scheduled, [facility] ?  ?Living arrangements for the past 2 months: Homeless ?Expected Discharge Date: 12/03/21               ?  ?  ?  ?  ?  ?  ?  ?  ?  ?  ? ? ?Social Determinants of Health (SDOH) Interventions ?  ? ?Readmission Risk Interventions ?No flowsheet data found. ? ?

## 2022-01-06 ENCOUNTER — Inpatient Hospital Stay: Payer: Self-pay | Admitting: Internal Medicine

## 2023-02-06 ENCOUNTER — Other Ambulatory Visit: Payer: Self-pay

## 2023-02-06 ENCOUNTER — Encounter (HOSPITAL_COMMUNITY): Payer: Self-pay

## 2023-02-06 ENCOUNTER — Emergency Department (HOSPITAL_COMMUNITY)
Admission: EM | Admit: 2023-02-06 | Discharge: 2023-02-06 | Discharge status: Against Medical Advice | Payer: Self-pay | Attending: Emergency Medicine | Admitting: Emergency Medicine

## 2023-02-06 DIAGNOSIS — Z5321 Procedure and treatment not carried out due to patient leaving prior to being seen by health care provider: Secondary | ICD-10-CM | POA: Insufficient documentation

## 2023-02-06 DIAGNOSIS — R071 Chest pain on breathing: Secondary | ICD-10-CM | POA: Insufficient documentation

## 2023-02-06 LAB — CBC
HCT: 36.7 % — ABNORMAL LOW (ref 39.0–52.0)
Hemoglobin: 12.5 g/dL — ABNORMAL LOW (ref 13.0–17.0)
MCH: 29.9 pg (ref 26.0–34.0)
MCHC: 34.1 g/dL (ref 30.0–36.0)
MCV: 87.8 fL (ref 80.0–100.0)
Platelets: 222 10*3/uL (ref 150–400)
RBC: 4.18 MIL/uL — ABNORMAL LOW (ref 4.22–5.81)
RDW: 12.7 % (ref 11.5–15.5)
WBC: 6.7 10*3/uL (ref 4.0–10.5)
nRBC: 0 % (ref 0.0–0.2)

## 2023-02-06 LAB — BASIC METABOLIC PANEL
Anion gap: 8 (ref 5–15)
BUN: 9 mg/dL (ref 6–20)
CO2: 30 mmol/L (ref 22–32)
Calcium: 8.8 mg/dL — ABNORMAL LOW (ref 8.9–10.3)
Chloride: 98 mmol/L (ref 98–111)
Creatinine, Ser: 0.98 mg/dL (ref 0.61–1.24)
GFR, Estimated: >60 mL/min (ref >60)
Glucose, Bld: 61 mg/dL — ABNORMAL LOW (ref 70–99)
Potassium: 3.8 mmol/L (ref 3.5–5.1)
Sodium: 136 mmol/L (ref 135–145)

## 2023-02-06 LAB — TROPONIN I (HIGH SENSITIVITY): Troponin I (High Sensitivity): 3 ng/L (ref <18)

## 2023-02-06 NOTE — ED Triage Notes (Signed)
PT arrives ambulatory via POV with a complaint of pain in his lungs and chest  when breathing.CP has been going on for a week and pain in the lungs has been ongoing for awhile. Pt does state that he has hx of endocarditis.

## 2023-09-14 ENCOUNTER — Encounter (HOSPITAL_COMMUNITY): Payer: Self-pay | Admitting: Internal Medicine

## 2023-11-12 ENCOUNTER — Ambulatory Visit: Payer: Self-pay | Admitting: Nurse Practitioner

## 2023-12-10 ENCOUNTER — Ambulatory Visit: Payer: Self-pay | Admitting: Nurse Practitioner

## 2023-12-14 ENCOUNTER — Ambulatory Visit: Payer: MEDICAID | Admitting: Adult Health

## 2023-12-28 ENCOUNTER — Encounter: Payer: Self-pay | Admitting: Nurse Practitioner

## 2023-12-28 ENCOUNTER — Ambulatory Visit (INDEPENDENT_AMBULATORY_CARE_PROVIDER_SITE_OTHER): Payer: MEDICAID | Admitting: Nurse Practitioner

## 2023-12-28 VITALS — BP 125/67 | HR 75 | Temp 98.0°F | Wt 181.6 lb

## 2023-12-28 DIAGNOSIS — Z86711 Personal history of pulmonary embolism: Secondary | ICD-10-CM

## 2023-12-28 DIAGNOSIS — Z0000 Encounter for general adult medical examination without abnormal findings: Secondary | ICD-10-CM | POA: Diagnosis not present

## 2023-12-28 DIAGNOSIS — Z139 Encounter for screening, unspecified: Secondary | ICD-10-CM

## 2023-12-28 DIAGNOSIS — Z13220 Encounter for screening for lipoid disorders: Secondary | ICD-10-CM | POA: Diagnosis not present

## 2023-12-28 DIAGNOSIS — Z1329 Encounter for screening for other suspected endocrine disorder: Secondary | ICD-10-CM

## 2023-12-28 DIAGNOSIS — Z8679 Personal history of other diseases of the circulatory system: Secondary | ICD-10-CM

## 2023-12-28 NOTE — Patient Instructions (Addendum)
 1. Thyroid disorder screen (Primary)  - TSH  2. Lipid screening  - Lipid Panel  3. Routine adult health maintenance  - CBC - Basic Metabolic Panel  4. History of pulmonary embolism  - Ambulatory referral to Pulmonology  5. 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 Encounter for screening involving social determinants of health (SDoH)  - AMB Referral VBCI Care Management

## 2023-12-28 NOTE — Progress Notes (Signed)
 Subjective   Patient ID: Duane Price, male    DOB: [birth year], 38 y.o.   MRN: [id number]  Chief Complaint  Patient presents with   Establish Care    Referring provider: No ref. provider found  Duane Price is a 38 y.o. male with Past Medical History: No date: IV drug user No date: MRSA bacteremia No date: Smoker   HPI  Patient presents to establish care and for physical.  He states that he is currently on methadone through [facility].  Patient does have a housing needs and food insecurity.  We will place a referral for social worker.  We did give him food from the food pantry in office today.  Patient does have a history of septic PE.  We will refer him back to pulmonary for follow-up.  He does have a history of endocarditis we will refer him back to cardiology for follow-up.  Denies f/c/s, n/v/d, hemoptysis, PND, leg swelling Denies chest pain or edema      No Known Allergies  Immunization History  Administered Date(s) Administered   Tdap 04/30/2016    Tobacco History: Social History   Tobacco Use  Smoking Status Every Day   Current packs/day: 0.50   Types: Cigarettes  Smokeless Tobacco Never   Ready to quit: Yes Counseling given: Yes   Outpatient Encounter Medications as of 12/28/2023  Medication Sig   acetaminophen (TYLENOL) 500 MG tablet Take 1 tablet (500 mg total) by mouth every 6 (six) hours as needed. (Patient not taking: Reported on 11/24/2021)   apixaban (ELIQUIS) 5 MG TABS tablet Take 1 tablet (5 mg total) by mouth 2 (two) times daily. (Patient not taking: Reported on 06/13/2021)   hydrOXYzine (ATARAX/VISTARIL) 25 MG tablet Take 1 tablet (25 mg total) by mouth 3 (three) times daily as needed for anxiety. (Patient not taking: Reported on 06/13/2021)   METHADONE HCL PO Take 100 mg by mouth every morning.   pantoprazole (PROTONIX) 40 MG tablet Take 1 tablet (40 mg total) by mouth daily.   permethrin (ELIMITE) 5 % cream Apply 5% cream to entire body from  the neck down, then wash off after 8 to 14 hours (guideline dosage) (Patient not taking: Reported on 12/01/2021)   No facility-administered encounter medications on file as of 12/28/2023.    Review of Systems  Review of Systems  Constitutional: Negative.   HENT: Negative.    Cardiovascular: Negative.   Gastrointestinal: Negative.   Allergic/Immunologic: Negative.   Neurological: Negative.   Psychiatric/Behavioral: Negative.       Objective:   BP 125/67   Pulse 75   Temp 98 F (36.7 C) (Oral)   Wt 181 lb 9.6 oz (82.4 kg)   SpO2 96%   BMI 24.63 kg/m   Wt Readings from Last 5 Encounters:  12/28/23 181 lb 9.6 oz (82.4 kg)  02/06/23 154 lb 15.7 oz (70.3 kg)  12/01/21 155 lb (70.3 kg)  06/22/21 160 lb (72.6 kg)  06/15/21 154 lb (69.9 kg)     Physical Exam Vitals and nursing note reviewed.  Constitutional:      General: He is not in acute distress.    Appearance: He is well-developed.  Cardiovascular:     Rate and Rhythm: Normal rate and regular rhythm.  Pulmonary:     Effort: Pulmonary effort is normal.     Breath sounds: Normal breath sounds.  Skin:    General: Skin is warm and dry.  Neurological:     Mental Status: He is alert  and oriented to person, place, and time.       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   Ivonne Andrew, NP 12/28/2023

## 2023-12-29 LAB — BASIC METABOLIC PANEL WITH GFR
BUN/Creatinine Ratio: 12 (ref 9–20)
BUN: 10 mg/dL (ref 6–20)
CO2: 28 mmol/L (ref 20–29)
Calcium: 9.1 mg/dL (ref 8.7–10.2)
Chloride: 102 mmol/L (ref 96–106)
Creatinine, Ser: 0.83 mg/dL (ref 0.76–1.27)
Glucose: 86 mg/dL (ref 70–99)
Potassium: 4.5 mmol/L (ref 3.5–5.2)
Sodium: 141 mmol/L (ref 134–144)
eGFR: 117 mL/min/{1.73_m2} (ref >59)

## 2023-12-29 LAB — CBC
Hematocrit: 37.6 % (ref 37.5–51.0)
Hemoglobin: 12.7 g/dL — ABNORMAL LOW (ref 13.0–17.7)
MCH: 31.1 pg (ref 26.6–33.0)
MCHC: 33.8 g/dL (ref 31.5–35.7)
MCV: 92 fL (ref 79–97)
Platelets: 267 10*3/uL (ref 150–450)
RBC: 4.08 x10E6/uL — ABNORMAL LOW (ref 4.14–5.80)
RDW: 13.5 % (ref 11.6–15.4)
WBC: 6.7 10*3/uL (ref 3.4–10.8)

## 2023-12-29 LAB — LIPID PANEL
Chol/HDL Ratio: 4.4 ratio (ref 0.0–5.0)
Cholesterol, Total: 135 mg/dL (ref 100–199)
HDL: 31 mg/dL — ABNORMAL LOW (ref >39)
LDL Chol Calc (NIH): 59 mg/dL (ref 0–99)
Triglycerides: 286 mg/dL — ABNORMAL HIGH (ref 0–149)
VLDL Cholesterol Cal: 45 mg/dL — ABNORMAL HIGH (ref 5–40)

## 2023-12-29 LAB — TSH: TSH: 1.24 u[IU]/mL (ref 0.450–4.500)

## 2023-12-31 ENCOUNTER — Telehealth: Payer: Self-pay | Admitting: *Deleted

## 2023-12-31 NOTE — Progress Notes (Signed)
 Complex Care Management Note Care Guide Note  12/31/2023 Name: Cortavious Nix MRN: [id number] DOB: 08/05/1986   Complex Care Management Outreach Attempts: An unsuccessful telephone outreach was attempted today to offer the patient information about available complex care management services.  Follow Up Plan:  Additional outreach attempts will be made to offer the patient complex care management information and services.   Encounter Outcome:  No Answer  [facility] Care Guide  Direct Dial:[phone] Fax:[phone] Website: [email or web address]

## 2024-01-01 ENCOUNTER — Telehealth: Payer: Self-pay | Admitting: *Deleted

## 2024-01-01 NOTE — Progress Notes (Signed)
 Complex Care Management Note Care Guide Note  01/01/2024 Name: Duane Price MRN: [id number] DOB: 1986/07/21   Complex Care Management Outreach Attempts: A second unsuccessful outreach was attempted today to offer the patient with information about available complex care management services.  Follow Up Plan:  Additional outreach attempts will be made to offer the patient complex care management information and services.   Encounter Outcome:  no answer  [facility] Care Guide  Direct Dial:[phone] [phone] Website: [email or web address]

## 2024-01-03 ENCOUNTER — Telehealth: Payer: Self-pay | Admitting: *Deleted

## 2024-01-03 NOTE — Progress Notes (Signed)
 Complex Care Management Note Care Guide Note  01/03/2024 Name: Gervis Gaba MRN: [id number] DOB: 1986/02/19  Aditya Nastasi is a 38 y.o. year old male who is a primary care patient of Ivonne Andrew, NP . The community resource team was consulted for assistance with Transportation Needs , Food Insecurity, and homeless  SDOH screenings and interventions completed:  Yes  Social Drivers of Health From This Encounter   Transportation Needs: No Transportation Needs (01/03/2024)   PRAPARE - Administrator, Civil Service (Medical): No    Lack of Transportation (Non-Medical): No  Recent Concern: Transportation Needs - Unmet Transportation Needs (01/03/2024)   PRAPARE - Administrator, Civil Service (Medical): No    Lack of Transportation (Non-Medical): Yes    SDOH Interventions Today    Flowsheet Row Most Recent Value  SDOH Interventions   Food Insecurity Interventions [employer] Resources Provided  [Provided food bank information including app for [place] food finder]  Housing Interventions Community Resources Provided  [patient connected partners ending homelessness]  Transportation Interventions Payor Benefit  Utilities Interventions Community Resources Provided  [[employer] will assit patient with these patinet is aware]        Care guide performed the following interventions: Patient provided with information about care guide support team and interviewed to confirm resource needs.  Follow Up Plan:  No further follow up planned at this time. The patient has been provided with needed resources.  Encounter Outcome:  Patient Visit Completed Dione Booze  [facility] HealthPopulation Health Care Guide  Direct Dial:[phone] [phone] Website: [email or web address]

## 2024-01-18 NOTE — Progress Notes (Deleted)
 Cardiology Office Note:   Date:  01/18/2024  ID:  Duane Price, DOB 09/13/1986, MRN [id number] PCP:  Duane Morel, NP  [facility] Providers Cardiologist:  [name], MD Referring MD: No ref. provider found  Chief Complaint/Reason for Referral: Endocarditis follow-up ASSESSMENT:    1. Endocarditis of tricuspid valve   2. Septic embolism (HCC)   3. Polysubstance abuse (HCC)     PLAN:   In order of problems listed above: Tricuspid valve endocarditis: Echocardiogram from 2022 was reassuring with a small amount of residual vegetation mild to moderate tricuspid regurgitation.  Check CRP, ESR.*** Septic emboli: This was noted during this hospitalization in 2022.  I do not think this needs to be     Polysubstance abuse:***  {Are you ordering a CV Procedure (e.g. stress test, cath, DCCV, TEE, etc)?   Press F2        :[id number]}   Dispo:  No follow-ups on file.      Medication Adjustments/Labs and Tests Ordered: Current medicines are reviewed at length with the patient today.  Concerns regarding medicines are outlined above.  The following changes have been made:  {PLAN; NO CHANGE:13088:s}   Labs/tests ordered: No orders of the defined types were placed in this encounter.   Medication Changes: No orders of the defined types were placed in this encounter.   Current medicines are reviewed at length with the patient today.  The patient {ACTIONS; HAS/DOES NOT HAVE:19233} concerns regarding medicines.  I spent *** minutes reviewing all clinical data during and prior to this visit including all relevant imaging studies, laboratories, clinical information from other health systems and prior notes from both Cardiology and other specialties, interviewing the patient, conducting a complete physical examination, and coordinating care in order to formulate a comprehensive and personalized evaluation and treatment plan.   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     Review of Systems:   Please see the history of present illness.    All other systems reviewed and are negative.     EKGs/Labs/Other Test Reviewed:   EKG: 2022 sinus rhythm  EKG Interpretation Date/Time:    Ventricular Rate:    PR Interval:    QRS Duration:    QT Interval:    QTC Calculation:   R Axis:      Text Interpretation:           Risk Assessment/Calculations:   {Does this patient have ATRIAL FIBRILLATION?:[id number]}      Physical Exam:   VS:  There were no vitals taken for this visit.   No BP recorded.  {Refresh Note OR Click here to enter BP  :1}***   Wt Readings from Last 3 Encounters:  12/28/23 181 lb 9.6 oz (82.4 kg)  02/06/23 154 lb 15.7 oz (70.3 kg)  12/01/21 155 lb (70.3 kg)      GENERAL:  No apparent distress, AOx3 HEENT:  No carotid bruits, +2 carotid impulses, no scleral icterus CAR: RRR Irregular RR*** no murmurs***, gallops, rubs, or thrills RES:  Clear to auscultation  bilaterally ABD:  Soft, nontender, nondistended, positive bowel sounds x 4 VASC:  +2 radial pulses, +2 carotid pulses NEURO:  CN 2-12 grossly intact; motor and sensory grossly intact PSYCH:  No active depression or anxiety EXT:  No edema, ecchymosis, or cyanosis  Signed, Duane Phy, MD  01/18/2024 9:02 AM    [facility] [address] [place], [place], [place]  [address] Phone: [phone]; Fax: [phone]   Note:  This document was prepared using Dragon voice recognition software and may include unintentional  dictation errors.

## 2024-01-21 ENCOUNTER — Ambulatory Visit: Payer: MEDICAID | Attending: Internal Medicine | Admitting: Internal Medicine

## 2024-01-21 DIAGNOSIS — I76 Septic arterial embolism: Secondary | ICD-10-CM

## 2024-01-21 DIAGNOSIS — I079 Rheumatic tricuspid valve disease, unspecified: Secondary | ICD-10-CM

## 2024-01-21 DIAGNOSIS — F1910 Other psychoactive substance abuse, uncomplicated: Secondary | ICD-10-CM

## 2024-01-22 ENCOUNTER — Encounter: Payer: Self-pay | Admitting: Internal Medicine

## 2024-01-25 ENCOUNTER — Ambulatory Visit: Payer: MEDICAID | Admitting: Pulmonary Disease

## 2024-06-20 NOTE — Progress Notes (Signed)
 No lung on Cardiology Office Note:   Date:  06/25/2024  ID:  Duane Price, DOB [birth year], MRN [id number] PCP:  Oley Bascom RAMAN, NP  [facility] Providers Cardiologist:  Wendel Haws, MD Referring MD: Oley Bascom RAMAN, NP  Chief Complaint/Reason for Referral: History of endocarditis ASSESSMENT:    1. Endocarditis of tricuspid valve   2. Septic embolism (HCC)   3. Homelessness     PLAN:   In order of problems listed above: Tricuspid endocarditis: Most recent echocardiogram in August 2022 was reassuring.  Will obtain echocardiogram to evaluate further. Septic emboli: This occurred in 2022.  No longer on anticoagulation.   Homelessness: Will make referral to case worker            Dispo:  Return in about 6 months (around 12/24/2024).       I spent 42 minutes reviewing all clinical data during and prior to this visit including all relevant imaging studies, laboratories, clinical information from other health systems and prior notes from both Cardiology and other specialties, interviewing the patient, conducting a complete physical examination, and coordinating care in order to formulate a comprehensive and personalized evaluation and treatment plan.   History of Present Illness:    FOCUSED PROBLEM LIST:   IV substance abuse MRSA endocarditis July 2022 Tricuspid vegetation >> septic emboli >> Angiovac extraction Mild TR, small or mobile mass on tricuspid valve, EF 65 to 70% TTE July 2022 Mild TR, mobile mass on tricuspid valve smaller versus previous study, EF 55 to 60% TTE April 25 2021 Mild TR, unchanged valvular mass, EF 55 to 60% TTE May 20 2021 Septic emboli No longer on anticoagulation  October 2025:  Patient consents to use of AI scribe. The patient is a 38 year old male with the above listed medical problems referred for recommendations regarding his history of endocarditis.  The patient was admitted to [facility] with tricuspid valve endocarditis  complicated by septic emboli in 2022.  He underwent angio vac extraction.  He was treated with anticoagulation due to septic emboli.  He was seen by his PCP recently and referred to cardiology and pulmonology for management of these issues.  He was previously on Eliquis , a blood thinner, but is no longer taking it. He currently has no chest pain, shortness of breath, infections, fevers, or chills. He occasionally experiences lightheadedness when standing up too quickly but denies any blacking out spells. There is no swelling in his legs or difficulty breathing when lying flat.  He is currently homeless, staying intermittently with a friend, and is on a waiting list for housing. He is single, not currently employed, and actively seeking work. He is on methadone  maintenance therapy and reports doing well with it.     Current Medications: Current Meds  Medication Sig   acetaminophen  (TYLENOL ) 500 MG tablet Take 1 tablet (500 mg total) by mouth every 6 (six) hours as needed.   METHADONE  HCL PO Take 100 mg by mouth every morning.     Review of Systems:   Please see the history of present illness.    All other systems reviewed and are negative.     EKGs/Labs/Other Test Reviewed:   EKG:   EKG Interpretation Date/Time:  Wednesday June 25 2024 10:52:42 EDT Ventricular Rate:  87 PR Interval:  154 QRS Duration:  92 QT Interval:  382 QTC Calculation: 459 R Axis:   88  Text Interpretation: Normal sinus rhythm Normal ECG When compared with ECG of 13-May-2021 13:12, PREVIOUS ECG  IS PRESENT Confirmed by Nakeya Adinolfi ([id number]) on 06/25/2024 11:11:42 AM        CARDIAC STUDIES: Refer to CV Procedures and Imaging Tabs   Risk Assessment/Calculations:          Physical Exam:   VS:  BP 94/66   Pulse 84   Ht 6' (1.829 m)   Wt 172 lb 6.4 oz (78.2 kg)   SpO2 97%   BMI 23.38 kg/m        Wt Readings from Last 3 Encounters:  06/25/24 172 lb 6.4 oz (78.2 kg)  12/28/23 181 lb 9.6 oz (82.4  kg)  02/06/23 154 lb 15.7 oz (70.3 kg)      GENERAL:  No apparent distress, AOx3 HEENT:  No carotid bruits, +2 carotid impulses, no scleral icterus CAR: RRR no murmurs, gallops, rubs, or thrills RES:  Clear to auscultation bilaterally ABD:  Soft, nontender, nondistended, positive bowel sounds x 4 VASC:  +2 radial pulses, +2 carotid pulses NEURO:  CN 2-12 grossly intact; motor and sensory grossly intact PSYCH:  No active depression or anxiety EXT:  No edema, ecchymosis, or cyanosis  Signed, Cesar Alf K Caliope Ruppert, MD  06/25/2024 11:56 AM    [facility] [address] [place], [place], [place]  [address] Phone: [phone]; Fax: [phone]   Note:  This document was prepared using Dragon voice recognition software and may include unintentional dictation errors.

## 2024-06-25 ENCOUNTER — Ambulatory Visit: Payer: MEDICAID | Attending: Internal Medicine | Admitting: Internal Medicine

## 2024-06-25 ENCOUNTER — Encounter: Payer: Self-pay | Admitting: Internal Medicine

## 2024-06-25 ENCOUNTER — Telehealth: Payer: Self-pay | Admitting: Licensed Clinical Social Worker

## 2024-06-25 VITALS — BP 94/66 | HR 84 | Ht 72.0 in | Wt 172.4 lb

## 2024-06-25 DIAGNOSIS — I079 Rheumatic tricuspid valve disease, unspecified: Secondary | ICD-10-CM | POA: Insufficient documentation

## 2024-06-25 DIAGNOSIS — Z5900 Homelessness unspecified: Secondary | ICD-10-CM | POA: Insufficient documentation

## 2024-06-25 DIAGNOSIS — I76 Septic arterial embolism: Secondary | ICD-10-CM | POA: Insufficient documentation

## 2024-06-25 NOTE — Telephone Encounter (Signed)
 Additional phone note opened in error.   Nathen Balder, MSW, LCSW Clinical Social Worker II [facility] Navigation  [phone]- work cell phone (preferred)

## 2024-06-25 NOTE — Patient Instructions (Addendum)
 Medication Instructions:  No changes   Eliquis  was removed from your list  *If you need a refill on your cardiac medications before your next appointment, please call your pharmacy*   Lab Work: Not needed If you have labs (blood work) drawn today and your tests are completely normal, you will receive your results only by: MyChart Message (if you have MyChart) OR A paper copy in the mail If you have any lab test that is abnormal or we need to change your treatment, we will call you to review the results.   Testing/Procedures: Your physician has requested that you have an echocardiogram. Echocardiography is a painless test that uses sound waves to create images of your heart. It provides your doctor with information about the size and shape of your heart and how well your heart's chambers and valves are working. This procedure takes approximately one hour. There are no restrictions for this procedure. Please do NOT wear cologne, perfume, aftershave, or lotions (deodorant is allowed). Please arrive 15 minutes prior to your appointment time.  Please note: We ask at that you not bring children with you during ultrasound (echo/ vascular) testing. Due to room size and safety concerns, children are not allowed in the ultrasound rooms during exams. Our front office staff cannot provide observation of children in our lobby area while testing is being conducted. An adult accompanying a patient to their appointment will only be allowed in the ultrasound room at the discretion of the ultrasound technician under special circumstances. We apologize for any inconvenience.    Follow-Up: At [facility], you and your health needs are our priority.  As part of our continuing mission to provide you with exceptional heart care, we have created designated Provider Care Teams.  These Care Teams include your primary Cardiologist (physician) and Advanced Practice Providers (APPs -  Physician Assistants and Nurse  Practitioners) who all work together to provide you with the care you need, when you need it.     Your next appointment:   6 month(s)  The format for your next appointment:   In Person  Provider:   Kenzie Campbell, NP, Dayna Dunn, PA-C, or Olivia Pavy, PA-C         Other Instructions

## 2024-06-25 NOTE — Progress Notes (Signed)
 Heart and Vascular Care Navigation  06/25/2024  Duane Price 09-Sep-1986 [id number]  Reason for Referral: housing insecurity   Engaged with patient face to face for initial visit for Heart and Vascular Care Coordination.                                                                                                   Assessment:            LCSW met with pt today in clinic. Introduced self, role, reason for visit. Pt confirmed he still gets his mail through [facility]. Is staying off and on with friends, confirmed his emergency contact remains the same. He connected with [facility] and has been staying in contact with them for housing assistance. Discussed community resources for food assistance, how to obtain medications with charge account through [facility] if needed. Pt has no additional questions/concerns when asked today. Encouraged him to reach out as needed.                            HRT/VAS Care Coordination     Patients Home Cardiology Office [facility]   Outpatient Care Team Social Worker   Social Worker Name: Marit Lark, [place], [phone]   Living arrangements for the past 2 months No permanent address   Lives with: Friends   Patient Current Insurance Coverage Medicaid   Patient Has Concern With Paying Medical Bills No   Does Patient Have Prescription Coverage? Yes   Home Assistive Devices/Equipment None       Social History:                                                                             SDOH Screenings   Food Insecurity: Food Insecurity Present (06/25/2024)  Housing: High Risk (06/25/2024)  Transportation Needs: No Transportation Needs (06/25/2024)  Utilities: Not At Risk (06/25/2024)  Depression (PHQ2-9): Medium Risk (12/28/2023)  Financial Resource Strain: High Risk (06/25/2024)  Tobacco Use: High Risk (06/25/2024)  Health Literacy: Adequate Health Literacy (06/25/2024)    SDOH Interventions: Financial Resources:  Surveyor, quantity Strain  Interventions: [employer] Provided (food resources and housing resources)  Food Insecurity:  Food Insecurity Interventions: [employer] Provided (Environmental manager and [place] food finder)  Housing Insecurity:  Housing Interventions: [employer] Provided (housing list and pt already active with [facility])  Transportation:   Transportation Interventions: Associate Professor    Other Care Navigation Interventions:     Provided Pharmacy assistance resources  Changed preferred pharmacy to [facility]- discussed [place] account if needed  Patient expressed Mental Health concerns No.   Follow-up plan:   LCSW provided pt with my card, food resources, discussed staying in touch with [facility] and encouraged him to call us  as needed  with any questions/concerns or if any additional resources needed.

## 2024-08-04 ENCOUNTER — Telehealth (HOSPITAL_COMMUNITY): Payer: Self-pay | Admitting: Internal Medicine

## 2024-08-04 ENCOUNTER — Ambulatory Visit (HOSPITAL_COMMUNITY): Payer: MEDICAID | Attending: Cardiovascular Disease

## 2024-08-04 NOTE — Telephone Encounter (Signed)
 Patient NO SHOWED the scheduled echocardiogram for 08/04/24.We will not contact patient to reschedule due to NO SHOW RATE of 32%. Order will be removed from the echo WQ. If patient calls back we will reinstate the order. Thank you.
